# Patient Record
Sex: Female | Born: 1955
Health system: Southern US, Community
[De-identification: ages and names within clinical notes are randomized; demographics above are authoritative.]

## PROBLEM LIST (undated history)

## (undated) DIAGNOSIS — I10 Essential (primary) hypertension: Secondary | ICD-10-CM

## (undated) DIAGNOSIS — Z86018 Personal history of other benign neoplasm: Secondary | ICD-10-CM

## (undated) DIAGNOSIS — R112 Nausea with vomiting, unspecified: Secondary | ICD-10-CM

## (undated) DIAGNOSIS — D649 Anemia, unspecified: Secondary | ICD-10-CM

## (undated) DIAGNOSIS — H25019 Cortical age-related cataract, unspecified eye: Secondary | ICD-10-CM

## (undated) DIAGNOSIS — Z9889 Other specified postprocedural states: Secondary | ICD-10-CM

## (undated) DIAGNOSIS — I700 Atherosclerosis of aorta: Secondary | ICD-10-CM

## (undated) DIAGNOSIS — E785 Hyperlipidemia, unspecified: Secondary | ICD-10-CM

## (undated) DIAGNOSIS — R519 Headache, unspecified: Secondary | ICD-10-CM

## (undated) DIAGNOSIS — Z79620 Long term (current) use of immunosuppressive biologic: Secondary | ICD-10-CM

## (undated) DIAGNOSIS — N183 Chronic kidney disease, stage 3 unspecified: Secondary | ICD-10-CM

## (undated) DIAGNOSIS — G43109 Migraine with aura, not intractable, without status migrainosus: Secondary | ICD-10-CM

## (undated) DIAGNOSIS — M1990 Unspecified osteoarthritis, unspecified site: Secondary | ICD-10-CM

## (undated) DIAGNOSIS — Z8719 Personal history of other diseases of the digestive system: Secondary | ICD-10-CM

## (undated) DIAGNOSIS — C642 Malignant neoplasm of left kidney, except renal pelvis: Secondary | ICD-10-CM

## (undated) DIAGNOSIS — I2089 Other forms of angina pectoris: Secondary | ICD-10-CM

## (undated) DIAGNOSIS — J189 Pneumonia, unspecified organism: Secondary | ICD-10-CM

## (undated) DIAGNOSIS — I471 Supraventricular tachycardia, unspecified: Secondary | ICD-10-CM

## (undated) DIAGNOSIS — R51 Headache: Secondary | ICD-10-CM

## (undated) DIAGNOSIS — N189 Chronic kidney disease, unspecified: Secondary | ICD-10-CM

## (undated) DIAGNOSIS — I4891 Unspecified atrial fibrillation: Secondary | ICD-10-CM

## (undated) DIAGNOSIS — F419 Anxiety disorder, unspecified: Secondary | ICD-10-CM

## (undated) DIAGNOSIS — I499 Cardiac arrhythmia, unspecified: Secondary | ICD-10-CM

## (undated) DIAGNOSIS — K222 Esophageal obstruction: Secondary | ICD-10-CM

## (undated) DIAGNOSIS — T4145XA Adverse effect of unspecified anesthetic, initial encounter: Secondary | ICD-10-CM

## (undated) DIAGNOSIS — Z7901 Long term (current) use of anticoagulants: Secondary | ICD-10-CM

## (undated) DIAGNOSIS — Z85828 Personal history of other malignant neoplasm of skin: Secondary | ICD-10-CM

## (undated) DIAGNOSIS — I208 Other forms of angina pectoris: Secondary | ICD-10-CM

## (undated) DIAGNOSIS — K219 Gastro-esophageal reflux disease without esophagitis: Secondary | ICD-10-CM

## (undated) DIAGNOSIS — R011 Cardiac murmur, unspecified: Secondary | ICD-10-CM

## (undated) DIAGNOSIS — J45909 Unspecified asthma, uncomplicated: Secondary | ICD-10-CM

## (undated) DIAGNOSIS — E7800 Pure hypercholesterolemia, unspecified: Secondary | ICD-10-CM

## (undated) DIAGNOSIS — I2510 Atherosclerotic heart disease of native coronary artery without angina pectoris: Secondary | ICD-10-CM

## (undated) DIAGNOSIS — T8859XA Other complications of anesthesia, initial encounter: Secondary | ICD-10-CM

## (undated) DIAGNOSIS — K200 Eosinophilic esophagitis: Secondary | ICD-10-CM

## (undated) HISTORY — DX: Eosinophilic esophagitis: K20.0

## (undated) HISTORY — DX: Unspecified atrial fibrillation: I48.91

## (undated) HISTORY — DX: Personal history of other diseases of the digestive system: Z87.19

## (undated) HISTORY — PX: CYSTOSCOPY/RETROGRADE/URETEROSCOPY: SHX5316

## (undated) HISTORY — PX: ABDOMINAL HYSTERECTOMY: SHX81

## (undated) HISTORY — PX: LEFT HEART CATH AND CORONARY ANGIOGRAPHY: CATH118249

## (undated) HISTORY — DX: Malignant neoplasm of left kidney, except renal pelvis: C64.2

## (undated) HISTORY — DX: Personal history of other benign neoplasm: Z86.018

## (undated) HISTORY — DX: Chronic kidney disease, unspecified: N18.9

## (undated) HISTORY — PX: DILATION AND CURETTAGE OF UTERUS: SHX78

## (undated) HISTORY — PX: TUBAL LIGATION: SHX77

## (undated) HISTORY — PX: ABLATION: SHX5711

## (undated) HISTORY — PX: CORONARY ANGIOPLASTY: SHX604

## (undated) HISTORY — PX: VAGINAL HYSTERECTOMY: SUR661

## (undated) HISTORY — DX: Esophageal obstruction: K22.2

## (undated) HISTORY — PX: NASAL ENDOSCOPY WITH EPISTAXIS CONTROL: SHX5664

## (undated) HISTORY — DX: Hyperlipidemia, unspecified: E78.5

---
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
## 1991-05-14 HISTORY — PX: TUBAL LIGATION: SHX77

## 1998-11-19 ENCOUNTER — Other Ambulatory Visit: Admission: RE | Admit: 1998-11-19 | Discharge: 1998-11-19 | Payer: Self-pay | Admitting: *Deleted

## 1998-12-08 ENCOUNTER — Encounter: Payer: Self-pay | Admitting: *Deleted

## 1998-12-08 ENCOUNTER — Inpatient Hospital Stay (HOSPITAL_COMMUNITY): Admission: EM | Admit: 1998-12-08 | Discharge: 1998-12-10 | Payer: Self-pay | Admitting: Emergency Medicine

## 2003-09-19 ENCOUNTER — Other Ambulatory Visit: Payer: Self-pay

## 2008-10-21 ENCOUNTER — Ambulatory Visit: Payer: Self-pay | Admitting: Obstetrics & Gynecology

## 2008-10-28 ENCOUNTER — Ambulatory Visit: Payer: Self-pay | Admitting: Obstetrics & Gynecology

## 2009-08-06 ENCOUNTER — Ambulatory Visit: Payer: Self-pay | Admitting: Unknown Physician Specialty

## 2009-10-02 ENCOUNTER — Ambulatory Visit: Payer: Self-pay | Admitting: Internal Medicine

## 2009-11-09 ENCOUNTER — Ambulatory Visit: Payer: Self-pay | Admitting: Gastroenterology

## 2009-11-11 LAB — PATHOLOGY REPORT

## 2009-12-01 ENCOUNTER — Ambulatory Visit: Payer: Self-pay | Admitting: Gastroenterology

## 2009-12-10 ENCOUNTER — Ambulatory Visit: Payer: Self-pay | Admitting: Gastroenterology

## 2010-07-16 ENCOUNTER — Ambulatory Visit: Payer: Self-pay | Admitting: Internal Medicine

## 2012-03-02 ENCOUNTER — Ambulatory Visit: Payer: Self-pay | Admitting: Internal Medicine

## 2013-06-04 ENCOUNTER — Ambulatory Visit: Payer: Self-pay | Admitting: Urology

## 2013-10-21 DIAGNOSIS — K2100 Gastro-esophageal reflux disease with esophagitis, without bleeding: Secondary | ICD-10-CM | POA: Insufficient documentation

## 2014-09-07 ENCOUNTER — Emergency Department
Admission: EM | Admit: 2014-09-07 | Discharge: 2014-09-07 | Disposition: A | Discharge status: Home / Self Care | Payer: 59 | Attending: Emergency Medicine | Admitting: Emergency Medicine

## 2014-09-07 ENCOUNTER — Encounter: Payer: Self-pay | Admitting: *Deleted

## 2014-09-07 ENCOUNTER — Emergency Department: Payer: 59

## 2014-09-07 DIAGNOSIS — Z7982 Long term (current) use of aspirin: Secondary | ICD-10-CM | POA: Insufficient documentation

## 2014-09-07 DIAGNOSIS — Z9861 Coronary angioplasty status: Secondary | ICD-10-CM | POA: Diagnosis not present

## 2014-09-07 DIAGNOSIS — I4891 Unspecified atrial fibrillation: Secondary | ICD-10-CM | POA: Diagnosis not present

## 2014-09-07 DIAGNOSIS — Z79899 Other long term (current) drug therapy: Secondary | ICD-10-CM | POA: Insufficient documentation

## 2014-09-07 DIAGNOSIS — I10 Essential (primary) hypertension: Secondary | ICD-10-CM | POA: Insufficient documentation

## 2014-09-07 DIAGNOSIS — R079 Chest pain, unspecified: Secondary | ICD-10-CM | POA: Diagnosis not present

## 2014-09-07 DIAGNOSIS — R000 Tachycardia, unspecified: Secondary | ICD-10-CM | POA: Diagnosis present

## 2014-09-07 DIAGNOSIS — Z793 Long term (current) use of hormonal contraceptives: Secondary | ICD-10-CM | POA: Insufficient documentation

## 2014-09-07 HISTORY — DX: Gastro-esophageal reflux disease without esophagitis: K21.9

## 2014-09-07 HISTORY — DX: Cardiac arrhythmia, unspecified: I49.9

## 2014-09-07 HISTORY — DX: Essential (primary) hypertension: I10

## 2014-09-07 LAB — URINALYSIS COMPLETE WITH MICROSCOPIC (ARMC ONLY)
BILIRUBIN URINE: NEGATIVE
Glucose, UA: NEGATIVE mg/dL
HGB URINE DIPSTICK: NEGATIVE
KETONES UR: NEGATIVE mg/dL
LEUKOCYTES UA: NEGATIVE
NITRITE: NEGATIVE
PH: 8 (ref 5.0–8.0)
Protein, ur: NEGATIVE mg/dL
RBC / HPF: NONE SEEN RBC/hpf (ref 0–5)
SPECIFIC GRAVITY, URINE: 1.001 — AB (ref 1.005–1.030)

## 2014-09-07 LAB — COMPREHENSIVE METABOLIC PANEL
ALBUMIN: 4.2 g/dL (ref 3.5–5.0)
ALT: 22 U/L (ref 14–54)
ANION GAP: 6 (ref 5–15)
AST: 24 U/L (ref 15–41)
Alkaline Phosphatase: 42 U/L (ref 38–126)
BILIRUBIN TOTAL: 0.4 mg/dL (ref 0.3–1.2)
BUN: 20 mg/dL (ref 6–20)
CO2: 27 mmol/L (ref 22–32)
Calcium: 9.1 mg/dL (ref 8.9–10.3)
Chloride: 106 mmol/L (ref 101–111)
Creatinine, Ser: 0.67 mg/dL (ref 0.44–1.00)
GFR calc Af Amer: >60 mL/min (ref >60)
GFR calc non Af Amer: >60 mL/min (ref >60)
GLUCOSE: 101 mg/dL — AB (ref 65–99)
POTASSIUM: 3.1 mmol/L — AB (ref 3.5–5.1)
Sodium: 139 mmol/L (ref 135–145)
TOTAL PROTEIN: 7.3 g/dL (ref 6.5–8.1)

## 2014-09-07 LAB — TROPONIN I: Troponin I: <0.03 ng/mL (ref <0.031)

## 2014-09-07 LAB — CBC
HEMATOCRIT: 38 % (ref 35.0–47.0)
Hemoglobin: 12.9 g/dL (ref 12.0–16.0)
MCH: 28.9 pg (ref 26.0–34.0)
MCHC: 34 g/dL (ref 32.0–36.0)
MCV: 85 fL (ref 80.0–100.0)
Platelets: 250 10*3/uL (ref 150–440)
RBC: 4.47 MIL/uL (ref 3.80–5.20)
RDW: 13.3 % (ref 11.5–14.5)
WBC: 6.1 10*3/uL (ref 3.6–11.0)

## 2014-09-07 MED ORDER — DILTIAZEM LOAD VIA INFUSION: 15.0000 mg | Freq: Once | INTRAVENOUS | Status: DC

## 2014-09-07 MED ORDER — ALUM & MAG HYDROXIDE-SIMETH 200-200-20 MG/5ML PO SUSP
30.0000 mL | Freq: Once | ORAL | Status: AC
  Administered 2014-09-07: 30 mL via ORAL
  Filled 2014-09-07: qty 30

## 2014-09-07 MED ORDER — DILTIAZEM HCL 25 MG/5ML IV SOLN
INTRAVENOUS | Status: AC
  Filled 2014-09-07: qty 5

## 2014-09-07 MED ORDER — SODIUM CHLORIDE 0.9 % IV BOLUS (SEPSIS)
1000.0000 mL | Freq: Once | INTRAVENOUS | Status: AC
  Administered 2014-09-07: 1000 mL via INTRAVENOUS

## 2014-09-07 NOTE — ED Notes (Signed)
Per Dr Clearnce Hasten, who spoke cardio, approved for pt to take own supply of lopressor 50mg  PO.

## 2014-09-07 NOTE — Discharge Instructions (Signed)
Atrial Fibrillation Take your metoprolol as prescribed. However, if you feel you heart beating rapidly may take an additional metoprolol to try and control the rate. If at any point you feel chest pain, lightheaded or like you're going to pass out report to the emergency department immediately before taking any additional metoprolol. Atrial fibrillation is a type of irregular heart rhythm (arrhythmia). During atrial fibrillation, the upper chambers of the heart (atria) quiver continuously in a chaotic pattern. This causes an irregular and often rapid heart rate.  Atrial fibrillation is the result of the heart becoming overloaded with disorganized signals that tell it to beat. These signals are normally released one at a time by a part of the right atrium called the sinoatrial node. They then travel from the atria to the lower chambers of the heart (ventricles), causing the atria and ventricles to contract and pump blood as they pass. In atrial fibrillation, parts of the atria outside of the sinoatrial node also release these signals. This results in two problems. First, the atria receive so many signals that they do not have time to fully contract. Second, the ventricles, which can only receive one signal at a time, beat irregularly and out of rhythm with the atria.  There are three types of atrial fibrillation:   Paroxysmal. Paroxysmal atrial fibrillation starts suddenly and stops on its own within a week.  Persistent. Persistent atrial fibrillation lasts for more than a week. It may stop on its own or with treatment.  Permanent. Permanent atrial fibrillation does not go away. Episodes of atrial fibrillation may lead to permanent atrial fibrillation. Atrial fibrillation can prevent your heart from pumping blood normally. It increases your risk of stroke and can lead to heart failure.  CAUSES   Heart conditions, including a heart attack, heart failure, coronary artery disease, and heart valve  conditions.   Inflammation of the sac that surrounds the heart (pericarditis).  Blockage of an artery in the lungs (pulmonary embolism).  Pneumonia or other infections.  Chronic lung disease.  Thyroid problems, especially if the thyroid is overactive (hyperthyroidism).  Caffeine, excessive alcohol use, and use of some illegal drugs.   Use of some medicines, including certain decongestants and diet pills.  Heart surgery.   Birth defects.  Sometimes, no cause can be found. When this happens, the atrial fibrillation is called lone atrial fibrillation. The risk of complications from atrial fibrillation increases if you have lone atrial fibrillation and you are age 60 years or older. RISK FACTORS  Heart failure.  Coronary artery disease.  Diabetes mellitus.   High blood pressure (hypertension).   Obesity.   Other arrhythmias.   Increased age. SIGNS AND SYMPTOMS   A feeling that your heart is beating rapidly or irregularly.   A feeling of discomfort or pain in your chest.   Shortness of breath.   Sudden light-headedness or weakness.   Getting tired easily when exercising.   Urinating more often than normal (mainly when atrial fibrillation first begins).  In paroxysmal atrial fibrillation, symptoms may start and suddenly stop. DIAGNOSIS  Your health care provider may be able to detect atrial fibrillation when taking your pulse. Your health care provider may have you take a test called an ambulatory electrocardiogram (ECG). An ECG records your heartbeat patterns over a 24-hour period. You may also have other tests, such as:  Transthoracic echocardiogram (TTE). During echocardiography, sound waves are used to evaluate how blood flows through your heart.  Transesophageal echocardiogram (TEE).  Stress test. There  is more than one type of stress test. If a stress test is needed, ask your health care provider about which type is best for you.  Chest X-ray  exam.  Blood tests.  Computed tomography (CT). TREATMENT  Treatment may include:  Treating any underlying conditions. For example, if you have an overactive thyroid, treating the condition may correct atrial fibrillation.  Taking medicine. Medicines may be given to control a rapid heart rate or to prevent blood clots, heart failure, or a stroke.  Having a procedure to correct the rhythm of the heart:  Electrical cardioversion. During electrical cardioversion, a controlled, low-energy shock is delivered to the heart through your skin. If you have chest pain, very low blood pressure, or sudden heart failure, this procedure may need to be done as an emergency.  Catheter ablation. During this procedure, heart tissues that send the signals that cause atrial fibrillation are destroyed.  Surgical ablation. During this surgery, thin lines of heart tissue that carry the abnormal signals are destroyed. This procedure can either be an open-heart surgery or a minimally invasive surgery. With the minimally invasive surgery, small cuts are made to access the heart instead of a large opening.  Pulmonary venous isolation. During this surgery, tissue around the veins that carry blood from the lungs (pulmonary veins) is destroyed. This tissue is thought to carry the abnormal signals. HOME CARE INSTRUCTIONS   Take medicines only as directed by your health care provider. Some medicines can make atrial fibrillation worse or recur.  If blood thinners were prescribed by your health care provider, take them exactly as directed. Too much blood-thinning medicine can cause bleeding. If you take too little, you will not have the needed protection against stroke and other problems.  Perform blood tests at home if directed by your health care provider. Perform blood tests exactly as directed.  Quit smoking if you smoke.  Do not drink alcohol.  Do not drink caffeinated beverages such as coffee, soda, and some  teas. You may drink decaffeinated coffee, soda, or tea.   Maintain a healthy weight.Do not use diet pills unless your health care provider approves. They may make heart problems worse.   Follow diet instructions as directed by your health care provider.  Exercise regularly as directed by your health care provider.  Keep all follow-up visits as directed by your health care provider. This is important. PREVENTION  The following substances can cause atrial fibrillation to recur:   Caffeinated beverages.  Alcohol.  Certain medicines, especially those used for breathing problems.  Certain herbs and herbal medicines, such as those containing ephedra or ginseng.  Illegal drugs, such as cocaine and amphetamines. Sometimes medicines are given to prevent atrial fibrillation from recurring. Proper treatment of any underlying condition is also important in helping prevent recurrence.  SEEK MEDICAL CARE IF:  You notice a change in the rate, rhythm, or strength of your heartbeat.  You suddenly begin urinating more frequently.  You tire more easily when exerting yourself or exercising. SEEK IMMEDIATE MEDICAL CARE IF:   You have chest pain, abdominal pain, sweating, or weakness.  You feel nauseous.  You have shortness of breath.  You suddenly have swollen feet and ankles.  You feel dizzy.  Your face or limbs feel numb or weak.  You have a change in your vision or speech. MAKE SURE YOU:   Understand these instructions.  Will watch your condition.  Will get help right away if you are not doing well or get  worse. Document Released: 12/20/2004 Document Revised: 05/06/2013 Document Reviewed: 01/31/2012 [facility] Patient Information 2015 [place], [place]. This information is not intended to replace advice given to you by your health care provider. Make sure you discuss any questions you have with your health care provider.  Chest Pain (Nonspecific) It is often hard to give a  diagnosis for the cause of chest pain. There is always a chance that your pain could be related to something serious, such as a heart attack or a blood clot in the lungs. You need to follow up with your doctor. HOME CARE  If antibiotic medicine was given, take it as directed by your doctor. Finish the medicine even if you start to feel better.  For the next few days, avoid activities that bring on chest pain. Continue physical activities as told by your doctor.  Do not use any tobacco products. This includes cigarettes, chewing tobacco, and e-cigarettes.  Avoid drinking alcohol.  Only take medicine as told by your doctor.  Follow your doctor's suggestions for more testing if your chest pain does not go away.  Keep all doctor visits you made. GET HELP IF:  Your chest pain does not go away, even after treatment.  You have a rash with blisters on your chest.  You have a fever. GET HELP RIGHT AWAY IF:   You have more pain or pain that spreads to your arm, neck, jaw, back, or belly (abdomen).  You have shortness of breath.  You cough more than usual or cough up blood.  You have very bad back or belly pain.  You feel sick to your stomach (nauseous) or throw up (vomit).  You have very bad weakness.  You pass out (faint).  You have chills. This is an emergency. Do not wait to see if the problems will go away. Call your local emergency services (911 in [place] Do not drive yourself to the hospital. MAKE SURE YOU:   Understand these instructions.  Will watch your condition.  Will get help right away if you are not doing well or get worse. Document Released: 06/08/2007 Document Revised: 12/25/2012 Document Reviewed: 06/08/2007 [facility] Patient Information 2015 [place], [place]. This information is not intended to replace advice given to you by your health care provider. Make sure you discuss any questions you have with your health care provider.

## 2014-09-07 NOTE — ED Notes (Signed)
Pt woke up with rapid HR, took one of her Xanax.

## 2014-09-07 NOTE — ED Provider Notes (Signed)
[facility] Emergency Department Provider Note  ____________________________________________  Time seen: Approximately 940 AM  I have reviewed the triage vital signs and the nursing notes.   HISTORY  Chief Complaint Tachycardia    HPI Heidi Torres is a 59 y.o. female with a history of multiple arrhythmias including atrial fibrillation and was presenting today with a rapid heart rate since 8:30 AM. She says that her heart rate started upon waking and is associated with neck pain as well as pressure in her lower chest. She denies any shortness of breath. Says that she thinks she may have a urinary tract infection because of some "tingling" before and after urinating. She says for anticoagulation she'll he takes an 81 mg dose of aspirin per day. Dr. Clayborn Bigness is her cardiologist. She also takes 50 mg of metoprolol succinate but did not take her dose this morning.Says that the chest pressure feels like her indigestion for which she takes Carafate and Protonix.   Past Medical History  Diagnosis Date  . Hypertension   . GERD (gastroesophageal reflux disease)   . Irregular heart beat     There are no active problems to display for this patient.   Past Surgical History  Procedure Laterality Date  . Abdominal hysterectomy    . Coronary angioplasty    . Nasal endoscopy with epistaxis control      Current Outpatient Rx  Name  Route  Sig  Dispense  Refill  . ADVAIR DISKUS 100-50 MCG/DOSE AEPB   Oral   Take 1 puff by mouth daily.           Dispense as written.   Marland Kitchen ALPRAZolam (XANAX) 0.25 MG tablet   Oral   Take 0.25 mg by mouth daily as needed. Anxiety.      5   . aspirin EC 81 MG tablet   Oral   Take 81 mg by mouth every other day.         . calcium carbonate (OS-CAL) 600 MG TABS tablet   Oral   Take 600 mg by mouth daily.         . Cholecalciferol (VITAMIN D3) 5000 UNITS TABS   Oral   Take 5,000 Units by mouth daily.         Marland Kitchen  estradiol (ESTRACE) 2 MG tablet   Oral   Take 2 mg by mouth daily.      6   . KLOR-CON M10 10 MEQ tablet   Oral   Take 20 mEq by mouth daily.           Dispense as written.   . metoprolol succinate (TOPROL-XL) 50 MG 24 hr tablet   Oral   Take 50 mg by mouth daily.         . montelukast (SINGULAIR) 10 MG tablet   Oral   Take 10 mg by mouth daily.         . pantoprazole (PROTONIX) 40 MG tablet   Oral   Take 40 mg by mouth daily.         Marland Kitchen PROAIR HFA 108 (90 BASE) MCG/ACT inhaler   Inhalation   Inhale 2 puffs into the lungs every 6 (six) hours as needed. For shortness of breath and wheezing.           Dispense as written.   . sucralfate (CARAFATE) 1 G tablet   Oral   Take 2 g by mouth every morning.         Marland Kitchen  triamterene-hydrochlorothiazide (DYAZIDE) 37.5-25 MG per capsule   Oral   Take 1 capsule by mouth daily.         . valsartan (DIOVAN) 160 MG tablet   Oral   Take 160 mg by mouth daily.           Allergies Ciprofloxacin  History reviewed. No pertinent family history.  Social History Social History  Substance Use Topics  . Smoking status: Never Smoker   . Smokeless tobacco: Never Used  . Alcohol Use: No    Review of Systems Constitutional: No fever/chills Eyes: No visual changes. ENT: No sore throat. Cardiovascular: Palpitations Respiratory: Denies shortness of breath. Gastrointestinal: No abdominal pain.  No nausea, no vomiting.  No diarrhea.  No constipation. Genitourinary: Negative for dysuria. Musculoskeletal: Negative for back pain. Skin: Negative for rash. Neurological: Negative for headaches, focal weakness or numbness.  10-point ROS otherwise negative.  ____________________________________________   PHYSICAL EXAM:  VITAL SIGNS: ED Triage Vitals  Enc Vitals Group     BP 09/07/14 0939 154/105 mmHg     Pulse Rate 09/07/14 0939 131     Resp 09/07/14 0939 16     Temp 09/07/14 0939 97.6 F (36.4 C)     Temp Source  09/07/14 0939 Oral     SpO2 09/07/14 0939 99 %     Weight 09/07/14 0939 161 lb (73.029 kg)     Height 09/07/14 0939 5\' 6"  (1.676 m)     Head Cir --      Peak Flow --      Pain Score --      Pain Loc --      Pain Edu? --      Excl. in [place]? --     Constitutional: Alert and oriented. Well appearing and in no acute distress. Eyes: Conjunctivae are normal. PERRL. EOMI. Head: Atraumatic. Nose: No congestion/rhinnorhea. Mouth/Throat: Mucous membranes are moist.  Oropharynx non-erythematous. Neck: No stridor.   Cardiovascular: Tachycardic with an irregularly irregular rhythm. Grossly normal heart sounds.  Good peripheral circulation. Respiratory: Normal respiratory effort.  No retractions. Lungs CTAB. Gastrointestinal: Soft and nontender. No distention. No abdominal bruits. No CVA tenderness. Musculoskeletal: No lower extremity tenderness nor edema.  No joint effusions. Neurologic:  Normal speech and language. No gross focal neurologic deficits are appreciated. No gait instability. Skin:  Skin is warm, dry and intact. No rash noted. Psychiatric: Mood and affect are normal. Speech and behavior are normal.  ____________________________________________   LABS (all labs ordered are listed, but only abnormal results are displayed)  Labs Reviewed  COMPREHENSIVE METABOLIC PANEL - Abnormal; Notable for the following:    Potassium 3.1 (*)    Glucose, Bld 101 (*)    All other components within normal limits  URINALYSIS COMPLETEWITH MICROSCOPIC ([facility] ONLY) - Abnormal; Notable for the following:    Color, Urine COLORLESS (*)    APPearance CLEAR (*)    Specific Gravity, Urine 1.001 (*)    Bacteria, UA RARE (*)    Squamous Epithelial / LPF 0-5 (*)    All other components within normal limits  CBC  TROPONIN I  TROPONIN I   ____________________________________________  EKG  ED ECG REPORT I, Doran Stabler, the attending physician, personally viewed and interpreted this ECG.   Date:  09/07/2014  EKG Time: 928  Rate: 135  Rhythm: atrial fibrillation, rate 135  Axis: Left axis deviation  Intervals:none  ST&T Change: T-wave inversion in aVL. Diffuse and mild ST depression most pronounced in leads  1, 2 and 3 as well as the lateral leads. There is no ST elevation.  ED ECG REPORT I, Doran Stabler, the attending physician, personally viewed and interpreted this ECG.   Date: 09/07/2014  EKG Time: 1038  Rate: 60  Rhythm: normal sinus rhythm  Axis: Left axis deviation  Intervals:none  ST&T Change: No ST elevations or depressions. No abnormal T-wave inversions.  No change from EKG from September 2005.   ____________________________________________  RADIOLOGY  Bibasilar subsegmental atelectasis versus scarring. I personally viewed these images. ____________________________________________   PROCEDURES    ____________________________________________   INITIAL IMPRESSION / ASSESSMENT AND PLAN / ED COURSE  Pertinent labs & imaging results that were available during my care of the patient were reviewed by me and considered in my medical decision making (see chart for details).  ----------------------------------------- 11:45 AM on 09/07/2014 -----------------------------------------  Patient converted spontaneously to normal sinus rhythm. Says that her neck pain is now resolved. Says that she still has persistent pressure to the epigastrium and low chest which is consistent with her indigestion. She says that she thinks she ate some sausage yesterday which exacerbated her reflux. She says that she has had reflux in the past which feels exactly like this. She also says that she has had this neck pain in the past with exertion. She says that she has had a catheterization as well as a stress test and has never needed any stents. She says this was part of a workup for her neck pain with exertion. She says that this neck pain has been ongoing for years without any  change.  I discussed her case with Dr Saralyn Pilar who recommends that the patient see Dr. Clayborn Bigness in the office to discuss anticoagulation. Does not recommend starting an irregular step this time. Patient says that she also does not want anticoagulation at this time. She knows that there is a risk of stroke and is familiar with because her mother had a stroke secondary to A. fib in her 78s.  The plan at this time is to recheck a troponin at 12:30. If her troponin is still undetectable she'll be discharged home for outpatient follow-up. We discussed the importance of taking her metoprolol as prescribed as well as using an additional dose when she feels her A. fib becoming rapid. She understands this plan, her husband is also the bedside and we were discussing this, and the patient is willing to comply.  ----------------------------------------- 1:46 PM on 09/07/2014 -----------------------------------------  Patient is resting comfortably at this time. Says that the Maalox did not help the pressure in her lower chest and upper abdomen but she says this is typical of her reflux. No further neck and jaw pain. Still a sinus rhythm on the monitor. Symptoms at this point appear chronic and the patient has a reassuring lab work and repeat EKG. We'll discharge to home for follow-up in the office with Dr. Clayborn Bigness this Tuesday. Patient and husband are aware of the plantar willing to comply. ____________________________________________   FINAL CLINICAL IMPRESSION(S) / ED DIAGNOSES  Acute atrial fibrillation with rapid ventricular response. Initial visit.      Orbie Pyo, MD 09/07/14 [phone]

## 2015-05-05 ENCOUNTER — Encounter: Admission: RE | Payer: Self-pay | Source: Ambulatory Visit

## 2015-05-05 ENCOUNTER — Ambulatory Visit: Admission: RE | Admit: 2015-05-05 | Payer: 59 | Source: Ambulatory Visit | Admitting: Gastroenterology

## 2015-05-05 SURGERY — ESOPHAGOGASTRODUODENOSCOPY (EGD) WITH PROPOFOL
Anesthesia: General

## 2015-06-22 DIAGNOSIS — Z85828 Personal history of other malignant neoplasm of skin: Secondary | ICD-10-CM

## 2015-06-22 HISTORY — DX: Personal history of other malignant neoplasm of skin: Z85.828

## 2015-12-22 ENCOUNTER — Other Ambulatory Visit: Payer: Self-pay | Admitting: Obstetrics and Gynecology

## 2015-12-22 DIAGNOSIS — N63 Unspecified lump in unspecified breast: Secondary | ICD-10-CM

## 2015-12-31 ENCOUNTER — Other Ambulatory Visit: Payer: 59

## 2015-12-31 ENCOUNTER — Ambulatory Visit
Admission: RE | Admit: 2015-12-31 | Discharge: 2015-12-31 | Disposition: A | Discharge status: Home / Self Care | Payer: 59 | Source: Ambulatory Visit | Attending: Obstetrics and Gynecology | Admitting: Obstetrics and Gynecology

## 2015-12-31 DIAGNOSIS — N63 Unspecified lump in unspecified breast: Secondary | ICD-10-CM

## 2016-01-07 ENCOUNTER — Other Ambulatory Visit: Payer: Self-pay | Admitting: Obstetrics and Gynecology

## 2016-01-07 DIAGNOSIS — N63 Unspecified lump in unspecified breast: Secondary | ICD-10-CM

## 2016-02-22 DIAGNOSIS — Z23 Encounter for immunization: Secondary | ICD-10-CM | POA: Diagnosis not present

## 2016-02-22 DIAGNOSIS — Z0000 Encounter for general adult medical examination without abnormal findings: Secondary | ICD-10-CM | POA: Diagnosis not present

## 2016-02-22 DIAGNOSIS — I10 Essential (primary) hypertension: Secondary | ICD-10-CM | POA: Diagnosis not present

## 2016-02-22 DIAGNOSIS — E7800 Pure hypercholesterolemia, unspecified: Secondary | ICD-10-CM | POA: Diagnosis not present

## 2016-03-16 DIAGNOSIS — R002 Palpitations: Secondary | ICD-10-CM | POA: Diagnosis not present

## 2016-03-16 DIAGNOSIS — R079 Chest pain, unspecified: Secondary | ICD-10-CM | POA: Diagnosis not present

## 2016-03-16 DIAGNOSIS — I208 Other forms of angina pectoris: Secondary | ICD-10-CM | POA: Diagnosis not present

## 2016-03-16 DIAGNOSIS — I10 Essential (primary) hypertension: Secondary | ICD-10-CM | POA: Diagnosis not present

## 2016-03-31 DIAGNOSIS — R002 Palpitations: Secondary | ICD-10-CM | POA: Diagnosis not present

## 2016-04-05 DIAGNOSIS — R079 Chest pain, unspecified: Secondary | ICD-10-CM | POA: Diagnosis not present

## 2016-04-05 DIAGNOSIS — I208 Other forms of angina pectoris: Secondary | ICD-10-CM | POA: Diagnosis not present

## 2016-04-08 DIAGNOSIS — I25118 Atherosclerotic heart disease of native coronary artery with other forms of angina pectoris: Secondary | ICD-10-CM | POA: Diagnosis not present

## 2016-04-08 DIAGNOSIS — I10 Essential (primary) hypertension: Secondary | ICD-10-CM | POA: Diagnosis not present

## 2016-04-08 DIAGNOSIS — R002 Palpitations: Secondary | ICD-10-CM | POA: Diagnosis not present

## 2016-04-19 DIAGNOSIS — M21961 Unspecified acquired deformity of right lower leg: Secondary | ICD-10-CM | POA: Diagnosis not present

## 2016-04-19 DIAGNOSIS — M2011 Hallux valgus (acquired), right foot: Secondary | ICD-10-CM | POA: Diagnosis not present

## 2016-05-16 DIAGNOSIS — I10 Essential (primary) hypertension: Secondary | ICD-10-CM | POA: Diagnosis not present

## 2016-05-16 DIAGNOSIS — R51 Headache: Secondary | ICD-10-CM | POA: Diagnosis not present

## 2016-05-19 DIAGNOSIS — R51 Headache: Secondary | ICD-10-CM | POA: Diagnosis not present

## 2016-05-19 DIAGNOSIS — J3489 Other specified disorders of nose and nasal sinuses: Secondary | ICD-10-CM | POA: Diagnosis not present

## 2016-05-19 DIAGNOSIS — R0982 Postnasal drip: Secondary | ICD-10-CM | POA: Diagnosis not present

## 2016-05-19 DIAGNOSIS — J301 Allergic rhinitis due to pollen: Secondary | ICD-10-CM | POA: Diagnosis not present

## 2016-06-24 DIAGNOSIS — R350 Frequency of micturition: Secondary | ICD-10-CM | POA: Diagnosis not present

## 2016-06-24 DIAGNOSIS — R300 Dysuria: Secondary | ICD-10-CM | POA: Diagnosis not present

## 2016-10-11 DIAGNOSIS — E7800 Pure hypercholesterolemia, unspecified: Secondary | ICD-10-CM | POA: Diagnosis not present

## 2016-10-11 DIAGNOSIS — E559 Vitamin D deficiency, unspecified: Secondary | ICD-10-CM | POA: Diagnosis not present

## 2016-10-11 DIAGNOSIS — I10 Essential (primary) hypertension: Secondary | ICD-10-CM | POA: Diagnosis not present

## 2016-10-12 DIAGNOSIS — I10 Essential (primary) hypertension: Secondary | ICD-10-CM | POA: Diagnosis not present

## 2016-10-12 DIAGNOSIS — E559 Vitamin D deficiency, unspecified: Secondary | ICD-10-CM | POA: Diagnosis not present

## 2016-10-12 DIAGNOSIS — E7800 Pure hypercholesterolemia, unspecified: Secondary | ICD-10-CM | POA: Diagnosis not present

## 2016-10-14 ENCOUNTER — Emergency Department: Payer: 59

## 2016-10-14 ENCOUNTER — Emergency Department
Admission: EM | Admit: 2016-10-14 | Discharge: 2016-10-14 | Disposition: A | Discharge status: Home / Self Care | Payer: 59 | Attending: Emergency Medicine | Admitting: Emergency Medicine

## 2016-10-14 DIAGNOSIS — Z9861 Coronary angioplasty status: Secondary | ICD-10-CM | POA: Diagnosis not present

## 2016-10-14 DIAGNOSIS — I10 Essential (primary) hypertension: Secondary | ICD-10-CM | POA: Insufficient documentation

## 2016-10-14 DIAGNOSIS — Z7982 Long term (current) use of aspirin: Secondary | ICD-10-CM | POA: Insufficient documentation

## 2016-10-14 DIAGNOSIS — Z79899 Other long term (current) drug therapy: Secondary | ICD-10-CM | POA: Insufficient documentation

## 2016-10-14 DIAGNOSIS — R51 Headache: Secondary | ICD-10-CM | POA: Insufficient documentation

## 2016-10-14 LAB — URINALYSIS, COMPLETE (UACMP) WITH MICROSCOPIC
BILIRUBIN URINE: NEGATIVE
Glucose, UA: NEGATIVE mg/dL
Hgb urine dipstick: NEGATIVE
Ketones, ur: NEGATIVE mg/dL
LEUKOCYTES UA: NEGATIVE
Nitrite: NEGATIVE
PH: 7 (ref 5.0–8.0)
Protein, ur: NEGATIVE mg/dL
RBC / HPF: NONE SEEN RBC/hpf (ref 0–5)
SPECIFIC GRAVITY, URINE: 1.005 (ref 1.005–1.030)

## 2016-10-14 LAB — TROPONIN I

## 2016-10-14 MED ORDER — CYCLOBENZAPRINE HCL 10 MG PO TABS: 10.0000 mg | ORAL_TABLET | Freq: Three times a day (TID) | ORAL | 0 refills | Status: DC | PRN

## 2016-10-14 NOTE — ED Triage Notes (Signed)
Pt c/o high blood pressure that is recurrent. Pt also states she has tingling all over face and a headache. Pt takes valsartan160mg  for blood pressure but has not taken any this morning. Pt saw her MD about BP on Wednesday.

## 2016-10-14 NOTE — ED Notes (Signed)
Pt back from CT at this time 

## 2016-10-14 NOTE — ED Provider Notes (Signed)
addendum to include patient states that she's been having a lot of muscle soreness along the neck and does have a tight right sided especially trapezius, which may be the source of her headaches. She is going try Flexeril.   Lisa Roca, MD 10/14/16 1210

## 2016-10-14 NOTE — Discharge Instructions (Signed)
You were evaluated for elevated blood pressures, and although no certain cause was found, your exam and evaluation are reassuring in the emergency department today.   Return to the emergency room immediately for any worsening or uncontrolled headache, vision changes, one-sided weakness or numbness, any confusion or altered mental status, chest pain, nausea or sweats or trouble breathing, or any other symptoms concerning to you.

## 2016-10-14 NOTE — ED Notes (Signed)
Pt reports that she has had a headache, face and leg tingling with high blood pressure. She went to see her PMD. They increased her B/P medication, she took her B/P today and it was elevated. She took her medication last night but not this morning.

## 2016-10-14 NOTE — ED Provider Notes (Signed)
[facility] Emergency Department Provider Note ____________________________________________   I have reviewed the triage vital signs and the triage nursing note.  HISTORY  Chief Complaint Hypertension   Historian Patient and friend at the bedside  HPI Heidi Torres is a 61 y.o. female with a history of hypertension and occasional asthma presents with concern about elevated blood pressures. She states that she started off with a headache which is gradual onset about a week ago which has been persistent. This is unusual for her. No vision changes. She feels like she's had tingly sensation across her whole face as well as both in her legs.  Several days ago she checked her blood pressure thinking may be the blood pressure might be causing her headache. She found her blood pressures have been elevated in the 180s to 200s. She had her primary care doctor evaluate her in the last 2 days where she had a set of blood work which was all normal. She also had an increase in her blood pressure dose of losartan from 50 mg daily to 75 mg daily, and yesterday took an additional 50 mg at night because of persistent elevated blood pressures. She is also on triamterene/HIDA chlorothiazide as well.  Over a month ago she switched from valsartan and to losartan and did not notice any changes or difficulties until this past week when she developed a headache and then a few days ago when she checked and found her blood pressures were elevated.  No chest pain or shortness of breath. No swelling.  She is concerned about her low-grade temperature 90.9 here in the emergency department. Denies urinary frequency.  States that she's been having some facial pain across the sinuses of her cheek bones.    Past Medical History:  Diagnosis Date  . GERD (gastroesophageal reflux disease)   . Hypertension   . Irregular heart beat     There are no active problems to display for this  patient.   Past Surgical History:  Procedure Laterality Date  . ABDOMINAL HYSTERECTOMY    . CORONARY ANGIOPLASTY    . NASAL ENDOSCOPY WITH EPISTAXIS CONTROL      Prior to Admission medications   Medication Sig Start Date End Date Taking? Authorizing Provider  ADVAIR DISKUS 100-50 MCG/DOSE AEPB Take 1 puff by mouth daily. 08/01/14  Yes [provider]  aspirin EC 81 MG tablet Take 81 mg by mouth every other day.   Yes [provider]  calcium carbonate (OS-CAL) 600 MG TABS tablet Take 600 mg by mouth daily.   Yes [provider]  Cholecalciferol (VITAMIN D3) 5000 UNITS TABS Take 5,000 Units by mouth daily.   Yes [provider]  estradiol (ESTRACE) 2 MG tablet Take 2 mg by mouth daily. 08/01/14  Yes [provider]  KLOR-CON M10 10 MEQ tablet Take 20 mEq by mouth daily. 07/10/14  Yes [provider]  losartan (COZAAR) 100 MG tablet Take 100 mg by mouth daily.   Yes [provider]  metoprolol succinate (TOPROL-XL) 50 MG 24 hr tablet Take 50 mg by mouth daily. Take with or immediately following a meal.   Yes [provider]  montelukast (SINGULAIR) 10 MG tablet Take 10 mg by mouth daily. 07/10/14  Yes [provider]  pantoprazole (PROTONIX) 40 MG tablet Take 40 mg by mouth 2 (two) times daily.  07/10/14  Yes [provider]  potassium chloride (K-DUR) 10 MEQ tablet Take 10 mEq by mouth 2 (two) times  daily.   Yes [provider]  sucralfate (CARAFATE) 1 G tablet Take by mouth 2 (two) times daily.  07/10/14  Yes [provider]  triamterene-hydrochlorothiazide (DYAZIDE) 37.5-25 MG per capsule Take 1 capsule by mouth daily. 07/10/14  Yes [provider]  ALPRAZolam (XANAX) 0.25 MG tablet Take 0.25 mg by mouth daily as needed. Anxiety. 08/11/14   [provider]  PROAIR HFA 108 (90 BASE) MCG/ACT inhaler Inhale 2 puffs into the lungs every 6 (six) hours as needed. For shortness of  breath and wheezing. 08/22/14   [provider]    Allergies  Allergen Reactions  . Ciprofloxacin Other (See Comments)    Arms were numb    History reviewed. No pertinent family history.  Social History Social History  Substance Use Topics  . Smoking status: Never Smoker  . Smokeless tobacco: Never Used  . Alcohol use No    Review of Systems  Constitutional: Negative for fever. Eyes: Negative for visual changes. ENT: Negative for sore throat. Cardiovascular: Negative for chest pain. Respiratory: Negative for shortness of breath. Gastrointestinal: Negative for abdominal pain, vomiting and diarrhea. Genitourinary: Negative for dysuria. Musculoskeletal: Negative for back pain. Skin: Negative for rash. Neurological: positive for headache.  ____________________________________________   PHYSICAL EXAM:  VITAL SIGNS: ED Triage Vitals  Enc Vitals Group     BP 10/14/16 0820 (!) 214/86     Pulse Rate 10/14/16 0820 67     Resp --      Temp 10/14/16 0820 99.2 F (37.3 C)     Temp Source 10/14/16 0820 Oral     SpO2 10/14/16 0820 99 %     Weight 10/14/16 0822 158 lb (71.7 kg)     Height 10/14/16 0822 5\' 6"  (1.676 m)     Head Circumference --      Peak Flow --      Pain Score --      Pain Loc --      Pain Edu? --      Excl. in [place]? --      Constitutional: Alert and oriented. Well appearing and in no distress. HEENT   Head: Normocephalic and atraumatic.  mild facial pain across the maxillary sinuses. No redness there.      Eyes: Conjunctivae are normal. Pupils equal and round.       Ears:         Nose: No congestion/rhinnorhea.   Mouth/Throat: Mucous membranes are moist.   Neck: No stridor.  no stiff neck. Cardiovascular/Chest: Normal rate, regular rhythm.  No murmurs, rubs, or gallops. Respiratory: Normal respiratory effort without tachypnea nor retractions. Breath sounds are clear and equal bilaterally. No wheezes/rales/rhonchi. Gastrointestinal:  Soft. No distention, no guarding, no rebound. Nontender.  Genitourinary/rectal:Deferred Musculoskeletal: Nontender with normal range of motion in all extremities. No joint effusions.  No lower extremity tenderness.  No edema. Neurologic:  Normal speech and language. No gross or focal neurologic deficits are appreciated. Skin:  Skin is warm, dry and intact. No rash noted. Psychiatric: Mood and affect are normal. Speech and behavior are normal. Patient exhibits appropriate insight and judgment.   ____________________________________________  LABS (pertinent positives/negatives) I, Lisa Roca, MD the attending physician have reviewed the labs noted below.  Labs Reviewed  URINALYSIS, COMPLETE (UACMP) WITH MICROSCOPIC - Abnormal; Notable for the following:       Result Value   Color, Urine STRAW (*)    APPearance CLEAR (*)    Bacteria, UA MANY (*)    Squamous  Epithelial / LPF 0-5 (*)    All other components within normal limits  URINE CULTURE  TROPONIN I    ____________________________________________    EKG I, Lisa Roca, MD, the attending physician have personally viewed and interpreted all ECGs.  57 bpm. Left axis deviation. Normal sinus rhythm. Nonspecific ST and T-wave ____________________________________________  RADIOLOGY All Xrays were viewed by me.  Imaging interpreted by Radiologist, and I, Lisa Roca, MD the attending physician have reviewed the radiologist interpretation noted below.  CT head and CT maxillofacial without contrast:  IMPRESSION: No intracranial abnormality.  No evidence of sinusitis. __________________________________________  PROCEDURES  Procedure(s) performed: None  Critical Care performed: None  ____________________________________________  No current facility-administered medications on file prior to encounter.    Current Outpatient Prescriptions on File Prior to Encounter  Medication Sig Dispense Refill  . ADVAIR DISKUS 100-50  MCG/DOSE AEPB Take 1 puff by mouth daily.    Marland Kitchen aspirin EC 81 MG tablet Take 81 mg by mouth every other day.    . calcium carbonate (OS-CAL) 600 MG TABS tablet Take 600 mg by mouth daily.    . Cholecalciferol (VITAMIN D3) 5000 UNITS TABS Take 5,000 Units by mouth daily.    Marland Kitchen estradiol (ESTRACE) 2 MG tablet Take 2 mg by mouth daily.  6  . KLOR-CON M10 10 MEQ tablet Take 20 mEq by mouth daily.    . montelukast (SINGULAIR) 10 MG tablet Take 10 mg by mouth daily.    . pantoprazole (PROTONIX) 40 MG tablet Take 40 mg by mouth 2 (two) times daily.     . sucralfate (CARAFATE) 1 G tablet Take by mouth 2 (two) times daily.     Marland Kitchen triamterene-hydrochlorothiazide (DYAZIDE) 37.5-25 MG per capsule Take 1 capsule by mouth daily.    Marland Kitchen ALPRAZolam (XANAX) 0.25 MG tablet Take 0.25 mg by mouth daily as needed. Anxiety.  5  . PROAIR HFA 108 (90 BASE) MCG/ACT inhaler Inhale 2 puffs into the lungs every 6 (six) hours as needed. For shortness of breath and wheezing.      ____________________________________________  ED COURSE / ASSESSMENT AND PLAN  Pertinent labs & imaging results that were available during my care of the patient were reviewed by me and considered in my medical decision making (see chart for details).  patient is very concerned about elevated blood pressures, despite having increased dose over the last 2 or 3 days given primary care doctor instructions, decided to come in for further evaluation.  I was able to review her CBC and metabolic panel from 2 days ago which are reassuring.  The only complaint he can come up with is that she's had a non specific headache for about a week, and we decided to proceed with head CT and maxillofacial sinusitis CT and these were overall reassuring.  She has bp meds at home.  UA with bacteria but no other associated finding for uti - will send culture, low suspician for active uti.   DIFFERENTIAL DIAGNOSIS:  Including but no limited to sinus infection, uti,  intracranial emergency such as stroke, bleeding, tumor, sinusitis, asymptomatic myocardial infarction, change in medication, etc.  CONSULTATIONS:  none  Patient / Family / Caregiver informed of clinical course, medical decision-making process, and agree with plan.   I discussed return precautions, follow-up instructions, and discharge instructions with patient and/or family.  Discharge Instructions : You were evaluated for elevated blood pressures, and although no certain cause was found, your exam and evaluation are reassuring in the emergency  department today.   Return to the emergency room immediately for any worsening or uncontrolled headache, vision changes, one-sided weakness or numbness, any confusion or altered mental status, chest pain, nausea or sweats or trouble breathing, or any other symptoms concerning to you.  ___________________________________________   FINAL CLINICAL IMPRESSION(S) / ED DIAGNOSES   Final diagnoses:  Essential hypertension              Note: This dictation was prepared with Dragon dictation. Any transcriptional errors that result from this process are unintentional    Lisa Roca, MD 10/14/16 1150

## 2016-10-17 DIAGNOSIS — M9901 Segmental and somatic dysfunction of cervical region: Secondary | ICD-10-CM | POA: Diagnosis not present

## 2016-10-17 DIAGNOSIS — M4302 Spondylolysis, cervical region: Secondary | ICD-10-CM | POA: Diagnosis not present

## 2016-10-17 LAB — URINE CULTURE: Culture: >=100000 — AB

## 2016-10-18 DIAGNOSIS — M9901 Segmental and somatic dysfunction of cervical region: Secondary | ICD-10-CM | POA: Diagnosis not present

## 2016-10-18 DIAGNOSIS — M4302 Spondylolysis, cervical region: Secondary | ICD-10-CM | POA: Diagnosis not present

## 2016-10-18 DIAGNOSIS — I10 Essential (primary) hypertension: Secondary | ICD-10-CM | POA: Diagnosis not present

## 2016-10-18 DIAGNOSIS — R011 Cardiac murmur, unspecified: Secondary | ICD-10-CM | POA: Diagnosis not present

## 2016-10-18 DIAGNOSIS — E7800 Pure hypercholesterolemia, unspecified: Secondary | ICD-10-CM | POA: Diagnosis not present

## 2016-10-18 NOTE — Progress Notes (Signed)
PHARMACY CONSULT NOTE - INITIAL   Pharmacy Consult for ED CULTURE RESULTS  Allergies  Allergen Reactions  . Ciprofloxacin Other (See Comments)    Arms were numb    Patient Measurements: Height: 5\' 6"  (167.6 cm) Weight: 158 lb (71.7 kg) IBW/kg (Calculated) : 59.3  Microbiology: Recent Results (from the past 720 hour(s))  Urine culture     Status: Abnormal   Collection Time: 10/14/16 10:01 AM  Result Value Ref Range Status   Specimen Description URINE, RANDOM  Final   Special Requests NONE  Final   Culture >=100,000 COLONIES/mL ESCHERICHIA COLI (A)  Final   Report Status 10/17/2016 FINAL  Final   Organism ID, Bacteria ESCHERICHIA COLI (A)  Final      Susceptibility   Escherichia coli - MIC*    AMPICILLIN >=32 RESISTANT Resistant     CEFAZOLIN <=4 SENSITIVE Sensitive     CEFTRIAXONE <=1 SENSITIVE Sensitive     CIPROFLOXACIN <=0.25 SENSITIVE Sensitive     GENTAMICIN <=1 SENSITIVE Sensitive     IMIPENEM <=0.25 SENSITIVE Sensitive     NITROFURANTOIN 64 INTERMEDIATE Intermediate     TRIMETH/SULFA >=320 RESISTANT Resistant     AMPICILLIN/SULBACTAM >=32 RESISTANT Resistant     PIP/TAZO <=4 SENSITIVE Sensitive     Extended ESBL NEGATIVE Sensitive     * >=100,000 COLONIES/mL ESCHERICHIA COLI    Medical History: Past Medical History:  Diagnosis Date  . GERD (gastroesophageal reflux disease)   . Hypertension   . Irregular heart beat    Assessment: 61 yo female seen in ED on 10/14/16 with hypertension and headache. UCx results now showing >100,000 E. Coli. Patient was not discharged from ED with any Abx.    Plan:  After discussion with Dr. Reita Cliche, patient will be prescribed Keflex (Cephalexin) 500mg  #21 capsules, taking 3 times daily for 7 days.   Patient was contacted on 10/16 @ 1315. Urine culture results and Abx plan were discussed. Patient verbalized understanding and requested RX be called in [facility] on [address] in [place].  RX for cephalexin was called into [facility] @1320   on 10/16.   Pernell Dupre, PharmD, BCPS Clinical Pharmacist 10/18/2016 1:23 PM

## 2016-10-21 DIAGNOSIS — M9901 Segmental and somatic dysfunction of cervical region: Secondary | ICD-10-CM | POA: Diagnosis not present

## 2016-10-21 DIAGNOSIS — M4302 Spondylolysis, cervical region: Secondary | ICD-10-CM | POA: Diagnosis not present

## 2016-11-01 DIAGNOSIS — R011 Cardiac murmur, unspecified: Secondary | ICD-10-CM | POA: Diagnosis not present

## 2016-11-03 DIAGNOSIS — J189 Pneumonia, unspecified organism: Secondary | ICD-10-CM

## 2016-11-03 HISTORY — DX: Pneumonia, unspecified organism: J18.9

## 2016-11-09 DIAGNOSIS — R300 Dysuria: Secondary | ICD-10-CM | POA: Diagnosis not present

## 2016-11-24 DIAGNOSIS — R918 Other nonspecific abnormal finding of lung field: Secondary | ICD-10-CM | POA: Diagnosis not present

## 2016-11-24 DIAGNOSIS — R05 Cough: Secondary | ICD-10-CM | POA: Diagnosis not present

## 2016-11-24 DIAGNOSIS — R509 Fever, unspecified: Secondary | ICD-10-CM | POA: Diagnosis not present

## 2016-11-24 DIAGNOSIS — J189 Pneumonia, unspecified organism: Secondary | ICD-10-CM | POA: Diagnosis not present

## 2016-11-28 ENCOUNTER — Other Ambulatory Visit: Payer: Self-pay | Admitting: Family Medicine

## 2016-11-28 DIAGNOSIS — J159 Unspecified bacterial pneumonia: Secondary | ICD-10-CM | POA: Diagnosis not present

## 2016-11-28 DIAGNOSIS — I10 Essential (primary) hypertension: Secondary | ICD-10-CM | POA: Diagnosis not present

## 2016-11-28 DIAGNOSIS — R9389 Abnormal findings on diagnostic imaging of other specified body structures: Secondary | ICD-10-CM | POA: Diagnosis not present

## 2016-11-29 ENCOUNTER — Ambulatory Visit
Admission: RE | Admit: 2016-11-29 | Discharge: 2016-11-29 | Disposition: A | Discharge status: Home / Self Care | Payer: 59 | Source: Ambulatory Visit | Attending: Family Medicine | Admitting: Family Medicine

## 2016-11-29 DIAGNOSIS — R935 Abnormal findings on diagnostic imaging of other abdominal regions, including retroperitoneum: Secondary | ICD-10-CM | POA: Diagnosis not present

## 2016-11-29 DIAGNOSIS — R918 Other nonspecific abnormal finding of lung field: Secondary | ICD-10-CM | POA: Insufficient documentation

## 2016-11-29 DIAGNOSIS — R9389 Abnormal findings on diagnostic imaging of other specified body structures: Secondary | ICD-10-CM | POA: Diagnosis present

## 2016-11-29 DIAGNOSIS — R590 Localized enlarged lymph nodes: Secondary | ICD-10-CM | POA: Insufficient documentation

## 2016-12-01 DIAGNOSIS — J189 Pneumonia, unspecified organism: Secondary | ICD-10-CM | POA: Diagnosis not present

## 2016-12-01 DIAGNOSIS — R9389 Abnormal findings on diagnostic imaging of other specified body structures: Secondary | ICD-10-CM | POA: Diagnosis not present

## 2016-12-08 ENCOUNTER — Other Ambulatory Visit: Payer: Self-pay | Admitting: Family Medicine

## 2016-12-08 DIAGNOSIS — N289 Disorder of kidney and ureter, unspecified: Secondary | ICD-10-CM

## 2016-12-09 DIAGNOSIS — J189 Pneumonia, unspecified organism: Secondary | ICD-10-CM | POA: Diagnosis not present

## 2016-12-12 ENCOUNTER — Encounter (INDEPENDENT_AMBULATORY_CARE_PROVIDER_SITE_OTHER): Payer: Self-pay | Admitting: Vascular Surgery

## 2016-12-14 ENCOUNTER — Encounter (INDEPENDENT_AMBULATORY_CARE_PROVIDER_SITE_OTHER): Payer: Self-pay | Admitting: Vascular Surgery

## 2016-12-14 ENCOUNTER — Encounter (INDEPENDENT_AMBULATORY_CARE_PROVIDER_SITE_OTHER): Payer: Self-pay

## 2016-12-14 ENCOUNTER — Ambulatory Visit (INDEPENDENT_AMBULATORY_CARE_PROVIDER_SITE_OTHER): Payer: BC Managed Care – PPO | Admitting: Vascular Surgery

## 2016-12-14 VITALS — BP 118/67 | HR 66 | Resp 17 | Ht 66.0 in | Wt 161.4 lb

## 2016-12-14 DIAGNOSIS — E785 Hyperlipidemia, unspecified: Secondary | ICD-10-CM

## 2016-12-14 DIAGNOSIS — I10 Essential (primary) hypertension: Secondary | ICD-10-CM | POA: Diagnosis not present

## 2016-12-14 NOTE — Progress Notes (Signed)
Subjective:    Patient ID: Heidi Torres, female    DOB: 28-May-1955, 61 y.o.   MRN: [id number] Chief Complaint  Patient presents with  . New Patient (Initial Visit)    ref [place] uncontrolled blood pressure   Presents as a new patient referred by Dr. Netty Starring for evaluation of possible renal artery stenosis.  Patient with a recent diagnosis of pneumonia - during workup found to possibly have neoplastic etiology to the lung and kidney.  The patient will be undergoing a CT of the abdomen tomorrow.  The order for this CT in EPIC states with and without contrast.  The patient was never "told about any contrast" and is unsure if the CT scan will be with PO / IV contrast.  The patient is currently on 5 antihypertensive medications and is experiencing difficulties controlling her hypertension.  The patient generally experiences systolic blood pressures in the 160s and 170s.  There is no recent renal function in epic or in the referral paperwork. Patient denies any kidney disease.  The patient denies any fever, nausea vomiting.   Review of Systems  Constitutional: Negative.   HENT: Negative.   Eyes: Negative.   Respiratory: Negative.   Cardiovascular:       Uncontrollable hypertension  Gastrointestinal: Negative.   Endocrine: Negative.   Genitourinary: Negative.   Musculoskeletal: Negative.   Skin: Negative.   Allergic/Immunologic: Negative.   Neurological: Negative.   Hematological: Negative.   Psychiatric/Behavioral: Negative.       Objective:   Physical Exam  Constitutional: She appears well-developed and well-nourished. No distress.  HENT:  Head: Normocephalic and atraumatic.  Eyes: Conjunctivae are normal. Pupils are equal, round, and reactive to light.  Neck: Normal range of motion.  Cardiovascular: Normal rate, regular rhythm, normal heart sounds and intact distal pulses.  Pulses:      Radial pulses are 2+ on the right side, and 2+ on the left side.       Dorsalis pedis  pulses are 2+ on the right side, and 2+ on the left side.       Posterior tibial pulses are 2+ on the right side, and 2+ on the left side.  Pulmonary/Chest: Effort normal and breath sounds normal.  Musculoskeletal: Normal range of motion. She exhibits no edema.  Neurological: She is alert.  Skin: Skin is dry. She is not diaphoretic.  Psychiatric: She has a normal mood and affect. Her behavior is normal. Judgment and thought content normal.  Vitals reviewed.  BP 118/67 (BP Location: Right Arm)   Pulse 66   Resp 17   Ht 5' 6" (1.676 m)   Wt 161 lb 6.4 oz (73.2 kg)   LMP  (LMP Unknown)   BMI 26.05 kg/m   Past Medical History:  Diagnosis Date  . GERD (gastroesophageal reflux disease)   . Hypertension   . Irregular heart beat    Social History   Socioeconomic History  . Marital status: Married    Spouse name: Not on file  . Number of children: Not on file  . Years of education: Not on file  . Highest education level: Not on file  Social Needs  . Financial resource strain: Not on file  . Food insecurity - worry: Not on file  . Food insecurity - inability: Not on file  . Transportation needs - medical: Not on file  . Transportation needs - non-medical: Not on file  Occupational History  . Not on file  Tobacco Use  . Smoking  status: Never Smoker  . Smokeless tobacco: Never Used  Substance and Sexual Activity  . Alcohol use: No  . Drug use: No  . Sexual activity: Not on file  Other Topics Concern  . Not on file  Social History Narrative  . Not on file   Past Surgical History:  Procedure Laterality Date  . ABDOMINAL HYSTERECTOMY    . CORONARY ANGIOPLASTY    . DILATION AND CURETTAGE OF UTERUS    . NASAL ENDOSCOPY WITH EPISTAXIS CONTROL    . TUBAL LIGATION     Family History  Problem Relation Age of Onset  . Hypertension Brother    Allergies  Allergen Reactions  . Ciprofloxacin Other (See Comments)    Arms were numb      Assessment & Plan:  Presents as a new  patient referred by Dr. Netty Starring for evaluation of possible renal artery stenosis.  Patient with a recent diagnosis of pneumonia - during workup found to possibly have neoplastic etiology to the lung and kidney.  The patient will be undergoing a CT of the abdomen tomorrow.  The order for this CT in EPIC states with and without contrast.  The patient was never "told about any contrast" and is unsure if the CT scan will be with PO / IV contrast.  The patient is currently on 5 antihypertensive medications and is experiencing difficulties controlling her hypertension.  The patient generally experiences systolic blood pressures in the 160s and 170s.  There is no recent renal function in epic or in the referral paperwork. Patient denies any kidney disease.  The patient denies any fever, nausea vomiting.  1. Hypertension, uncontrolled - New The patient has multiple risk factors for atherosclerotic disease The patient is on 5 antihypertensive medications and still experiences elevated systolic blood pressures of 160-170 The patient is undergoing a CT of the abdomen tomorrow however it is not a CTA and I do not know if the current order is with contrast.  I will order a renal artery duplex to assess for any renal artery stenosis  - VAS US RENAL ARTERY DUPLEX; Future  Of note: Due to the patient's recent illness, she has met her deductible as well as used many sick days.  Patient asking to be scheduled ASAP for renal artery duplex to assess for any renal artery stenosis.  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  There is a 2:00pm slot for renal artery ultrasound.  The patient understands that she will need to be n.p.o. for this ultrasound.  I understand this is later than we normally schedule however in an effort to help the patient I will place her in the slot.  Plan discussed with the office manager and lead ultrasonographer.  2. Hyperlipidemia, unspecified hyperlipidemia type -  Stable Encouraged good control as its slows the progression of atherosclerotic disease  Current Outpatient Medications on File Prior to Visit  Medication Sig Dispense Refill  . ADVAIR DISKUS 100-50 MCG/DOSE AEPB Take 1 puff by mouth daily.    Marland Kitchen ALPRAZolam (XANAX) 0.25 MG tablet Take 0.25 mg by mouth daily as needed. Anxiety.  5  . amLODipine (NORVASC) 10 MG tablet Take 10 mg by mouth daily.  3  . amoxicillin-clavulanate (AUGMENTIN) 875-125 MG tablet Take 1 tablet by mouth every 12 (twelve) hours.  0  . aspirin EC 81 MG tablet Take 81 mg by mouth every other day.    Marland Kitchen atorvastatin (LIPITOR) 20 MG tablet Take by mouth.    Marland Kitchen  azithromycin (ZITHROMAX) 250 MG tablet Take 2 tablets (546m) by mouth on Day 1. Take 1 tablet (2547m by mouth on Days 2-5.    . benzonatate (TESSALON) 100 MG capsule TAKE ONE CAPSULE BY MOUTH 3 TIMES A DAY AS NEEDED FOR COUGH  0  . calcium carbonate (OS-CAL) 600 MG TABS tablet Take 600 mg by mouth daily.    . Cholecalciferol (VITAMIN D3) 5000 UNITS TABS Take 5,000 Units by mouth daily.    . Marland Kitchenstradiol (ESTRACE) 2 MG tablet Take 2 mg by mouth daily.  6  . guaiFENesin (MUCINEX) 600 MG 12 hr tablet Take by mouth 2 (two) times daily as needed.    . metoprolol succinate (TOPROL-XL) 50 MG 24 hr tablet Take 50 mg by mouth daily as needed. Take with or immediately following a meal.     . montelukast (SINGULAIR) 10 MG tablet Take 10 mg by mouth daily.    . pantoprazole (PROTONIX) 40 MG tablet Take 40 mg by mouth 2 (two) times daily.     . potassium chloride (K-DUR) 10 MEQ tablet Take 10 mEq by mouth 2 (two) times daily.    . predniSONE (DELTASONE) 20 MG tablet Take 40 mg by mouth daily.  0  . PROAIR HFA 108 (90 BASE) MCG/ACT inhaler Inhale 2 puffs into the lungs every 6 (six) hours as needed. For shortness of breath and wheezing.    . sucralfate (CARAFATE) 1 G tablet Take by mouth 2 (two) times daily.     . Marland Kitchenriamterene-hydrochlorothiazide (DYAZIDE) 37.5-25 MG per capsule Take 1  capsule by mouth daily.    . valsartan (DIOVAN) 160 MG tablet TAKE 1 TABLET (160 MG TOTAL) BY MOUTH ONCE DAILY.  1  . albuterol (PROVENTIL HFA) 108 (90 Base) MCG/ACT inhaler Inhale into the lungs.    . cyclobenzaprine (FLEXERIL) 10 MG tablet Take 1 tablet (10 mg total) by mouth 3 (three) times daily as needed for muscle spasms. (Patient not taking: Reported on 12/14/2016) 21 tablet 0  . KLOR-CON M10 10 MEQ tablet Take 20 mEq by mouth daily.    . Marland Kitchenosartan (COZAAR) 100 MG tablet Take 100 mg by mouth daily.     No current facility-administered medications on file prior to visit.    There are no Patient Instructions on file for this visit. No Follow-up on file.   A , PA-C

## 2016-12-15 ENCOUNTER — Encounter (INDEPENDENT_AMBULATORY_CARE_PROVIDER_SITE_OTHER): Payer: Self-pay | Admitting: Vascular Surgery

## 2016-12-15 ENCOUNTER — Ambulatory Visit (INDEPENDENT_AMBULATORY_CARE_PROVIDER_SITE_OTHER): Payer: BC Managed Care – PPO | Admitting: Vascular Surgery

## 2016-12-15 ENCOUNTER — Ambulatory Visit
Admission: RE | Admit: 2016-12-15 | Discharge: 2016-12-15 | Disposition: A | Discharge status: Home / Self Care | Payer: BC Managed Care – PPO | Source: Ambulatory Visit | Attending: Family Medicine | Admitting: Family Medicine

## 2016-12-15 ENCOUNTER — Ambulatory Visit (INDEPENDENT_AMBULATORY_CARE_PROVIDER_SITE_OTHER): Payer: BC Managed Care – PPO

## 2016-12-15 VITALS — BP 153/83 | HR 81 | Resp 17 | Ht 66.0 in | Wt 154.0 lb

## 2016-12-15 DIAGNOSIS — E782 Mixed hyperlipidemia: Secondary | ICD-10-CM | POA: Diagnosis not present

## 2016-12-15 DIAGNOSIS — I10 Essential (primary) hypertension: Secondary | ICD-10-CM | POA: Diagnosis not present

## 2016-12-15 DIAGNOSIS — A310 Pulmonary mycobacterial infection: Secondary | ICD-10-CM | POA: Diagnosis not present

## 2016-12-15 DIAGNOSIS — D3501 Benign neoplasm of right adrenal gland: Secondary | ICD-10-CM | POA: Diagnosis not present

## 2016-12-15 DIAGNOSIS — J189 Pneumonia, unspecified organism: Secondary | ICD-10-CM | POA: Diagnosis not present

## 2016-12-15 DIAGNOSIS — R9389 Abnormal findings on diagnostic imaging of other specified body structures: Secondary | ICD-10-CM | POA: Diagnosis not present

## 2016-12-15 DIAGNOSIS — N2889 Other specified disorders of kidney and ureter: Secondary | ICD-10-CM | POA: Diagnosis not present

## 2016-12-15 DIAGNOSIS — N289 Disorder of kidney and ureter, unspecified: Secondary | ICD-10-CM

## 2016-12-15 DIAGNOSIS — I700 Atherosclerosis of aorta: Secondary | ICD-10-CM | POA: Insufficient documentation

## 2016-12-15 DIAGNOSIS — E785 Hyperlipidemia, unspecified: Secondary | ICD-10-CM | POA: Diagnosis present

## 2016-12-15 DIAGNOSIS — J449 Chronic obstructive pulmonary disease, unspecified: Secondary | ICD-10-CM | POA: Diagnosis not present

## 2016-12-15 HISTORY — DX: Pulmonary mycobacterial infection: A31.0

## 2016-12-15 MED ORDER — IOPAMIDOL (ISOVUE-370) INJECTION 76%
100.0000 mL | Freq: Once | INTRAVENOUS | Status: AC | PRN
  Administered 2016-12-15: 100 mL via INTRAVENOUS

## 2016-12-15 NOTE — Progress Notes (Signed)
MRN : [id number]  Heidi Torres is a 61 y.o. (1955-08-05) female who presents with chief complaint of No chief complaint on file. Marland Kitchen  History of Present Illness: The patient returns to the office for followup and review of the noninvasive studies regarding renal vascular hypertension and renal artery stenosis. There have been no interval changes in the patient's blood pressure control.  He denies any major changes in is medications.  The patient is currently on 5 antihypertensive medications and is experiencing difficulties controlling her hypertension.  The patient generally experiences systolic blood pressures in the 160s and 170s.  There is no recent renal function in epic or in the referral paperwork. Patient denies any kidney disease.  The patient denies any fever, nausea vomiting.The patient denies headache or flushing.  No flank or unusual back pain.    There have been no significant changes to the patient's overall health care.  No interval shortening of the patient's walking distance or new symptoms consistent with claudication.  The patient denies the  development of rest pain symptoms. No new ulcers or wounds have occurred since the last visit.  The patient denies amaurosis fugax or recent TIA symptoms. There are no recent neurological changes noted. The patient denies history of DVT, PE or superficial thrombophlebitis. The patient denies recent episodes of angina or shortness of breath.   Duplex ultrasound of the renal arteries is negative for RAS CT with contrast confirms this as well   No outpatient medications have been marked as taking for the 12/15/16 encounter (Appointment) with Delana Meyer, Dolores Lory, MD.    Past Medical History:  Diagnosis Date  . GERD (gastroesophageal reflux disease)   . Hypertension   . Irregular heart beat     Past Surgical History:  Procedure Laterality Date  . ABDOMINAL HYSTERECTOMY    . CORONARY ANGIOPLASTY    . DILATION AND CURETTAGE OF  UTERUS    . NASAL ENDOSCOPY WITH EPISTAXIS CONTROL    . TUBAL LIGATION      Social History Social History   Tobacco Use  . Smoking status: Never Smoker  . Smokeless tobacco: Never Used  Substance Use Topics  . Alcohol use: No  . Drug use: No    Family History Family History  Problem Relation Age of Onset  . Hypertension Brother     Allergies  Allergen Reactions  . Ciprofloxacin Other (See Comments)    Arms were numb     REVIEW OF SYSTEMS (Negative unless checked)  Constitutional: [] Weight loss  [] Fever  [] Chills Cardiac: [] Chest pain   [] Chest pressure   [] Palpitations   [] Shortness of breath when laying flat   [] Shortness of breath with exertion. Vascular:  [] Pain in legs with walking   [] Pain in legs at rest  [] History of DVT   [] Phlebitis   [] Swelling in legs   [] Varicose veins   [] Non-healing ulcers Pulmonary:   [] Uses home oxygen   [] Productive cough   [] Hemoptysis   [] Wheeze  [] COPD   [] Asthma Neurologic:  [] Dizziness   [] Seizures   [] History of stroke   [] History of TIA  [] Aphasia   [] Vissual changes   [] Weakness or numbness in arm   [] Weakness or numbness in leg Musculoskeletal:   [] Joint swelling   [] Joint pain   [] Low back pain Hematologic:  [] Easy bruising  [] Easy bleeding   [] Hypercoagulable state   [] Anemic Gastrointestinal:  [] Diarrhea   [] Vomiting  [] Gastroesophageal reflux/heartburn   [] Difficulty swallowing. Genitourinary:  [] Chronic kidney disease   []   Difficult urination  [] Frequent urination   [] Blood in urine Skin:  [] Rashes   [] Ulcers  Psychological:  [] History of anxiety   []  History of major depression.  Physical Examination  There were no vitals filed for this visit. There is no height or weight on file to calculate BMI. Gen: WD/WN, NAD Head: [place]/AT, No temporalis wasting.  Ear/Nose/Throat: Hearing grossly intact, nares w/o erythema or drainage Eyes: PER, EOMI, sclera nonicteric.  Neck: Supple, no large masses.   Pulmonary:  Good air  movement, no audible wheezing bilaterally, no use of accessory muscles.  Cardiac: RRR, no JVD Vascular: Vessel Right Left  Radial Palpable Palpable  PT Palpable Palpable  DP Palpable Palpable  Gastrointestinal: Non-distended. No guarding/no peritoneal signs.  Musculoskeletal: M/S 5/5 throughout.  No deformity or atrophy.  Neurologic: CN 2-12 intact. Symmetrical.  Speech is fluent. Motor exam as listed above. Psychiatric: Judgment intact, Mood & affect appropriate for pt's clinical situation. Dermatologic: No rashes or ulcers noted.  No changes consistent with cellulitis. Lymph : No lichenification or skin changes of chronic lymphedema.  CBC Lab Results  Component Value Date   WBC 6.1 09/07/2014   HGB 12.9 09/07/2014   HCT 38.0 09/07/2014   MCV 85.0 09/07/2014   PLT 250 09/07/2014    BMET    Component Value Date/Time   NA 139 09/07/2014 0952   K 3.1 (L) 09/07/2014 0952   CL 106 09/07/2014 0952   CO2 27 09/07/2014 0952   GLUCOSE 101 (H) 09/07/2014 0952   BUN 20 09/07/2014 0952   CREATININE 0.67 09/07/2014 0952   CALCIUM 9.1 09/07/2014 0952   GFRNONAA >60 09/07/2014 0952   GFRAA >60 09/07/2014 0952   CrCl cannot be calculated (Patient's most recent lab result is older than the maximum 21 days allowed.).  COAG No results found for: INR, PROTIME  Radiology Ct Chest Wo Contrast  Result Date: 11/29/2016 CLINICAL DATA:  Cough and congestion. EXAM: CT CHEST WITHOUT CONTRAST TECHNIQUE: Multidetector CT imaging of the chest was performed following the standard protocol without IV contrast. COMPARISON:  None. FINDINGS: Cardiovascular: Heart size upper normal. Trace pericardial effusion evident. Mediastinum/Nodes: Scattered upper normal mediastinal lymph nodes evident. Index 9 mm short axis right paratracheal lymph nodes seen image 54 series 2. 9 mm short axis right hilar lymph node visualized on image 72. The esophagus has normal imaging features. There is no axillary  lymphadenopathy. Lungs/Pleura: Right upper lobe tree-in-bud nodularity is associated with patchy ground-glass attenuation and bronchial wall thickening. Similar findings are evident in the right middle and lower lobes. Subsegmental atelectasis noted in the lingula. No pleural effusion. Upper Abdomen: Rounded expansion of the left anterior kidney in the interpolar region noted. Musculoskeletal: Bone windows reveal no worrisome lytic or sclerotic osseous lesions. IMPRESSION: 1. Central tree-in-bud opacity with scattered nodularity and patchy ground-glass attenuation identified in the right upper, middle, and lower lobes. Imaging features suggest atypical infection including MAI. Given the associated nodularity, repeat CT after therapy recommended to assess for resolution. 2. Rounded expansion of the anterior interpolar left kidney, incompletely visualized and incompletely characterized on this noncontrast exam. While this may be normal anatomy, mass lesion cannot be excluded. Dedicated abdominal CT with contrast material recommended to further evaluate. 3. Borderline mediastinal and right hilar lymphadenopathy, likely reactive. These results will be called to the ordering clinician or representative by the Radiologist Assistant, and communication documented in the PACS or zVision Dashboard. Electronically Signed   By: Misty Stanley M.D.   On: 11/29/2016 10:15  Assessment/Plan 1. Malignant hypertension BP is acceptable today No evidence of vascular pathology to explain her hypertension  The patient's vital signs and noninvasive studies support the renal artery stenosis is not significantly increased when compared to the previous study.  No invasive studies or intervention is indicated at this time.  The patient will continue the current antihypertensive medications, no changes at this time.  The primary medical service will continue aggressive antihypertensive therapy as per the AHA guidelines   2.  Mixed hyperlipidemia Continue statin as ordered and reviewed, no changes at this time   3. Chronic obstructive pulmonary disease, unspecified COPD type ([place]) Continue pulmonary medications and aerosols as already ordered, these medications have been reviewed and there are no changes at this time.     Hortencia Pilar, MD  12/15/2016 1:49 PM

## 2016-12-20 DIAGNOSIS — D4102 Neoplasm of uncertain behavior of left kidney: Secondary | ICD-10-CM | POA: Diagnosis not present

## 2016-12-21 ENCOUNTER — Other Ambulatory Visit: Payer: Self-pay | Admitting: Urology

## 2016-12-21 DIAGNOSIS — D4102 Neoplasm of uncertain behavior of left kidney: Secondary | ICD-10-CM

## 2016-12-22 DIAGNOSIS — R05 Cough: Secondary | ICD-10-CM | POA: Diagnosis not present

## 2016-12-22 DIAGNOSIS — Z1283 Encounter for screening for malignant neoplasm of skin: Secondary | ICD-10-CM | POA: Diagnosis not present

## 2016-12-22 DIAGNOSIS — L820 Inflamed seborrheic keratosis: Secondary | ICD-10-CM | POA: Diagnosis not present

## 2016-12-22 DIAGNOSIS — J4520 Mild intermittent asthma, uncomplicated: Secondary | ICD-10-CM | POA: Diagnosis not present

## 2016-12-22 DIAGNOSIS — R0602 Shortness of breath: Secondary | ICD-10-CM | POA: Diagnosis not present

## 2016-12-22 DIAGNOSIS — L570 Actinic keratosis: Secondary | ICD-10-CM | POA: Diagnosis not present

## 2016-12-22 DIAGNOSIS — D485 Neoplasm of uncertain behavior of skin: Secondary | ICD-10-CM | POA: Diagnosis not present

## 2016-12-22 DIAGNOSIS — D229 Melanocytic nevi, unspecified: Secondary | ICD-10-CM | POA: Diagnosis not present

## 2016-12-29 ENCOUNTER — Ambulatory Visit
Admission: RE | Admit: 2016-12-29 | Discharge: 2016-12-29 | Disposition: A | Discharge status: Home / Self Care | Payer: BC Managed Care – PPO | Source: Ambulatory Visit | Attending: Urology | Admitting: Urology

## 2016-12-29 DIAGNOSIS — D4102 Neoplasm of uncertain behavior of left kidney: Secondary | ICD-10-CM

## 2016-12-29 DIAGNOSIS — N289 Disorder of kidney and ureter, unspecified: Secondary | ICD-10-CM | POA: Diagnosis not present

## 2016-12-29 MED ORDER — GADOBENATE DIMEGLUMINE 529 MG/ML IV SOLN
14.0000 mL | Freq: Once | INTRAVENOUS | Status: AC | PRN
  Administered 2016-12-29: 14 mL via INTRAVENOUS

## 2017-01-01 ENCOUNTER — Other Ambulatory Visit: Payer: BC Managed Care – PPO

## 2017-01-03 DIAGNOSIS — C642 Malignant neoplasm of left kidney, except renal pelvis: Secondary | ICD-10-CM

## 2017-01-03 HISTORY — DX: Malignant neoplasm of left kidney, except renal pelvis: C64.2

## 2017-01-10 ENCOUNTER — Other Ambulatory Visit: Payer: Self-pay | Admitting: Urology

## 2017-01-17 NOTE — Patient Instructions (Signed)
Klover Priestly  01/17/2017   Your procedure is scheduled on: 01-20-17  Report to [facility] Main  Entrance  Follow signs to Short Stay on first floor at 530 AM  Call this number if you have problems the morning of surgery [phone]    Remember: Do not eat food or drink liquids :After Midnight.     Take these medicines the morning of surgery with A SIP OF WATER: INHALERS AND BRING, DEXILANT, ATORVASTATIN, AMLODIPINE                                 You may not have any metal on your body including hair pins and              piercings  Do not wear jewelry, make-up, lotions, powders or perfumes, deodorant             Do not wear nail polish.  Do not shave  48 hours prior to surgery.             Do not bring valuables to the hospital. [place].  Contacts, dentures or bridgework may not be worn into surgery.      Patients discharged the day of surgery will not be allowed to drive home.  Name and phone number of your driver:  Special Instructions: N/A              Please read over the following fact sheets you were given: _____________________________________________________________________           [facility] - Preparing for Surgery Before surgery, you can play an important role.  Because skin is not sterile, your skin needs to be as free of germs as possible.  You can reduce the number of germs on your skin by washing with CHG (chlorahexidine gluconate) soap before surgery.  CHG is an antiseptic cleaner which kills germs and bonds with the skin to continue killing germs even after washing. Please DO NOT use if you have an allergy to CHG or antibacterial soaps.  If your skin becomes reddened/irritated stop using the CHG and inform your nurse when you arrive at Short Stay. Do not shave (including legs and underarms) for at least 48 hours prior to the first CHG shower.  You may shave your face/neck. Please  follow these instructions carefully:  1.  Shower with CHG Soap the night before surgery and the  morning of Surgery.  2.  If you choose to wash your hair, wash your hair first as usual with your  normal  shampoo.  3.  After you shampoo, rinse your hair and body thoroughly to remove the  shampoo.                           4.  Use CHG as you would any other liquid soap.  You can apply chg directly  to the skin and wash                       Gently with a scrungie or clean washcloth.  5.  Apply the CHG Soap to your body ONLY FROM THE NECK DOWN.   Do not use on face/ open  Wound or open sores. Avoid contact with eyes, ears mouth and genitals (private parts).                       Wash face,  Genitals (private parts) with your normal soap.             6.  Wash thoroughly, paying special attention to the area where your surgery  will be performed.  7.  Thoroughly rinse your body with warm water from the neck down.  8.  DO NOT shower/wash with your normal soap after using and rinsing off  the CHG Soap.                9.  Pat yourself dry with a clean towel.            10.  Wear clean pajamas.            11.  Place clean sheets on your bed the night of your first shower and do not  sleep with pets. Day of Surgery : Do not apply any lotions/deodorants the morning of surgery.  Please wear clean clothes to the hospital/surgery center.  FAILURE TO FOLLOW THESE INSTRUCTIONS MAY RESULT IN THE CANCELLATION OF YOUR SURGERY PATIENT SIGNATURE_________________________________  NURSE SIGNATURE__________________________________  ________________________________________________________________________

## 2017-01-17 NOTE — Progress Notes (Addendum)
EKG 10-14-17 Epic   CT CHEST 11-29-16 Epic   12-22-16 OVN PULM. Epic  12-15-16 [facility] VASCULAR Epic  pft'S 12-22-16 Epic CARE EVERYWHERE  OVN CARDS 10-18-16 Epic  ECHO 11-02-16 Epic CARE EVERYWHERE

## 2017-01-18 ENCOUNTER — Encounter (HOSPITAL_COMMUNITY): Payer: Self-pay

## 2017-01-18 ENCOUNTER — Other Ambulatory Visit: Payer: Self-pay

## 2017-01-18 ENCOUNTER — Encounter (INDEPENDENT_AMBULATORY_CARE_PROVIDER_SITE_OTHER): Payer: Self-pay

## 2017-01-18 ENCOUNTER — Encounter (HOSPITAL_COMMUNITY)
Admission: RE | Admit: 2017-01-18 | Discharge: 2017-01-18 | Disposition: A | Discharge status: Home / Self Care | Payer: BC Managed Care – PPO | Source: Ambulatory Visit | Attending: Urology | Admitting: Urology

## 2017-01-18 DIAGNOSIS — Z79899 Other long term (current) drug therapy: Secondary | ICD-10-CM | POA: Diagnosis not present

## 2017-01-18 DIAGNOSIS — N2889 Other specified disorders of kidney and ureter: Secondary | ICD-10-CM | POA: Diagnosis present

## 2017-01-18 DIAGNOSIS — I4891 Unspecified atrial fibrillation: Secondary | ICD-10-CM | POA: Diagnosis not present

## 2017-01-18 DIAGNOSIS — Z7982 Long term (current) use of aspirin: Secondary | ICD-10-CM | POA: Diagnosis not present

## 2017-01-18 DIAGNOSIS — J45909 Unspecified asthma, uncomplicated: Secondary | ICD-10-CM | POA: Diagnosis not present

## 2017-01-18 DIAGNOSIS — I10 Essential (primary) hypertension: Secondary | ICD-10-CM | POA: Diagnosis not present

## 2017-01-18 HISTORY — DX: Cardiac murmur, unspecified: R01.1

## 2017-01-18 HISTORY — DX: Adverse effect of unspecified anesthetic, initial encounter: T41.45XA

## 2017-01-18 HISTORY — DX: Personal history of other diseases of the digestive system: Z87.19

## 2017-01-18 HISTORY — DX: Unspecified asthma, uncomplicated: J45.909

## 2017-01-18 HISTORY — DX: Pneumonia, unspecified organism: J18.9

## 2017-01-18 HISTORY — DX: Headache: R51

## 2017-01-18 HISTORY — DX: Other complications of anesthesia, initial encounter: T88.59XA

## 2017-01-18 HISTORY — DX: Headache, unspecified: R51.9

## 2017-01-18 LAB — CBC
HEMATOCRIT: 37.1 % (ref 36.0–46.0)
HEMOGLOBIN: 12.2 g/dL (ref 12.0–15.0)
MCH: 28.7 pg (ref 26.0–34.0)
MCHC: 32.9 g/dL (ref 30.0–36.0)
MCV: 87.3 fL (ref 78.0–100.0)
Platelets: 266 10*3/uL (ref 150–400)
RBC: 4.25 MIL/uL (ref 3.87–5.11)
RDW: 13.3 % (ref 11.5–15.5)
WBC: 7.6 10*3/uL (ref 4.0–10.5)

## 2017-01-18 LAB — BASIC METABOLIC PANEL
ANION GAP: 7 (ref 5–15)
BUN: 22 mg/dL — AB (ref 6–20)
CO2: 28 mmol/L (ref 22–32)
Calcium: 9.1 mg/dL (ref 8.9–10.3)
Chloride: 102 mmol/L (ref 101–111)
Creatinine, Ser: 0.63 mg/dL (ref 0.44–1.00)
Glucose, Bld: 99 mg/dL (ref 65–99)
POTASSIUM: 3.3 mmol/L — AB (ref 3.5–5.1)
SODIUM: 137 mmol/L (ref 135–145)

## 2017-01-18 NOTE — Progress Notes (Signed)
BMP done 01-18-17 routed to Dr. Junious Silk via epic.

## 2017-01-19 NOTE — H&P (Signed)
Office Visit Report     12/20/2016   --------------------------------------------------------------------------------   Heidi Torres  MRN: [id number]  PRIMARY CARE:  [name], MD  DOB: Apr 29, 1955, 62 year old Female  REFERRING:  Emily Filbert, MD  SSN: -[id number]  PROVIDER:  Festus Aloe, M.D.    LOCATION:  [facility] [phone]   --------------------------------------------------------------------------------   CC: I have a renal mass.  HPI: Heidi Torres is a 62 year-old female established patient who is here for a renal mass.  The problem is on the left side. Her problem was diagnosed 12/16/2016. She has had no symptoms. She had the following x-rays done: CT Scan. She has not had kidney surgery.   She has not seen blood in her urine. She does have a good appetite. BOWEL HABITS: her bowels are not moving normally. She has not recently had unwanted weight loss.   Patient had issues with atypical mycoplasma pneumonia and underwent follow-up CT scan of the chest November 29, 2016. This revealed a masslike appearance of the left kidney. Therefore she underwent a CT scan of the abdomen without and with contrast December 15, 2016 which revealed this to be normal renal parenchyma. However, there was a subtle 1.8 x 2.1 cm enhancing lesion in the mid left kidney, medial and posterior to the renal pelvis. It does not appear to involve the collecting system. She's had no gross hematuria or flank pain. No frequency. She has occasional bladder pressure with a full bladder. She was seen in the past for recurrent UTI. Interestingly, recently she's had higher blood pressure and needed to be on triple therapy for hypertension.   Significant past medical history includes A. fib. and asthma. She was seen in the past for recurrent urinary tract infection.     ALLERGIES: Cipro TABS    MEDICATIONS: Advair Diskus  Amlodipine Besylate 10 mg tablet  Aspirin Ec 81 mg tablet,  delayed release Oral  Calcium TABS Oral  Estradiol 2 mg tablet Oral  Klor-Con 10 10 meq tablet, extended release Oral  Magnesium 400 mg capsule Oral  Metoprolol Tartrate 50 mg tablet Oral  Pantoprazole Sodium 40 mg tablet, delayed release Oral  Singulair  Sucralfate 1 gram tablet Oral  Triamterene-Hydrochlorothiazid 37.5 mg-25 mg capsule Oral  Valsartan-Hydrochlorothiazide 160 mg-12.5 mg tablet Oral  Vitamin C 500 mg tablet Oral  Vitamin D-3 TABS Oral     GU PSH: Hysterectomy Unilat SO - 2016 Sling - 2016      [place] Notes: Vaginal Sling Operation For Stress Incontinence, Hysterectomy   NON-GU PSH: None   GU PMH: Dysuria, Dysuria - 2016 Oth GU systems Signs/Symptoms, Urethralgia - 2016 Urinary Tract Inf, Unspec site, E-coli UTI - 2016 Dorsalgia, Unspec, Back pain, acute - 2016    NON-GU PMH: Encounter for general adult medical examination without abnormal findings, Encounter for preventive health examination - 2016 Personal history of other diseases of the circulatory system, History of atrial fibrillation - 2016, History of cardiac arrhythmia, - 2016, History of cardiac murmur, - 2016, History of essential hypertension, - 2016 Personal history of other diseases of the digestive system, History of gastric ulcer - 2016, History of esophageal reflux, - 2016 Personal history of other diseases of the respiratory system, History of asthma - 2016 Personal history of other endocrine, nutritional and metabolic disease, History of hyperlipidemia - 2016    FAMILY HISTORY: Asthma - Runs In Family cardiac arrhythmia - Runs In Family cardiac disorder - Runs In Family Deceased - Runs  In Family Hypertension - Runs In Family Kidney Stones - Runs In Family squamous cell carcinoma - Runs In Family   SOCIAL HISTORY: None    Notes: Never a smoker, Non-smoker, Non-smoker, Caffeine use, Married, No alcohol use, Occupation, Number of children   REVIEW OF SYSTEMS:    GU Review Female:   Patient  denies frequent urination, hard to postpone urination, burning /pain with urination, get up at night to urinate, leakage of urine, stream starts and stops, trouble starting your stream, have to strain to urinate, and being pregnant.  Gastrointestinal (Upper):   Patient denies nausea, vomiting, and indigestion/ heartburn.  Gastrointestinal (Lower):   Patient denies constipation and diarrhea.  Constitutional:   Patient denies fever, night sweats, weight loss, and fatigue.  Skin:   Patient denies skin rash/ lesion and itching.  Eyes:   Patient denies blurred vision and double vision.  Ears/ Nose/ Throat:   Patient denies sore throat and sinus problems.  Hematologic/Lymphatic:   Patient denies swollen glands and easy bruising.  Cardiovascular:   Patient denies leg swelling and chest pains.  Respiratory:   Patient denies cough and shortness of breath.  Endocrine:   Patient denies excessive thirst.  Musculoskeletal:   Patient denies back pain and joint pain.  Neurological:   Patient denies headaches and dizziness.  Psychologic:   Patient denies depression and anxiety.   VITAL SIGNS:      12/20/2016 02:10 PM  Weight 162 lb / 73.48 kg  Height 66 in / 167.64 cm  BP 116/71 mmHg  Pulse 60 /min  BMI 26.1 kg/m   MULTI-SYSTEM PHYSICAL EXAMINATION:    Constitutional: Well-nourished. No physical deformities. Normally developed. Good grooming.  Neck: Neck symmetrical, not swollen. Normal tracheal position.  Respiratory: No labored breathing, no use of accessory muscles.   Cardiovascular: Normal temperature, normal extremity pulses, no swelling, no varicosities.  Neurologic / Psychiatric: Oriented to time, oriented to place, oriented to person. No depression, no anxiety, no agitation.  Gastrointestinal: No mass, no tenderness, no rigidity, non obese abdomen.     PAST DATA REVIEWED:  Source Of History:  Patient  X-Ray Review: C.T. Abdomen/Pelvis: Reviewed Films. 12/2016 x 2    PROCEDURES:           Urinalysis Dipstick Dipstick Cont'd  Color: Yellow Bilirubin: Neg  Appearance: Clear Ketones: Neg  Specific Gravity: 1.020 Blood: Neg  pH: 6.0 Protein: Neg  Glucose: Neg Urobilinogen: 0.2    Nitrites: Neg    Leukocyte Esterase: Neg    ASSESSMENT:      ICD-10 Details  1 GU:   Left uncertain neoplasm of kidney - D41.02    PLAN:           Orders Labs BMP          Schedule X-Rays: 2 Weeks - MRI Kidney With and Without I.V. Contrast  Return Visit/Planned Activity: Return PRN - Office Visit, Follow up MD          Document Letter(s):  Created for Patient: Clinical Summary         Notes:   left renal neoplasm of uncertain behavior - Discussed with patient and husband the nature of renal masses-benign and malignant. We discussed the nature risks and benefits of biopsy, surveillance, ablation, partial and radical nephrectomy. This tumor is medial with the renal pelvis just anterior and the vasculature wrapping around it. Check BMP. Obtaub MRI kidney. Pt doesn't want to wait for a period of surveillance right now.   cc:  Dr. Netty Starring     ** Signed by Festus Aloe, M.D. on 12/20/16 at 4:52 PM (EST)**     The information contained in this medical record document is considered private and confidential patient information. This information can only be used for the medical diagnosis and/or medical services that are being provided by the patient's selected caregivers. This information can only be distributed outside of the patient's care if the patient agrees and signs waivers of authorization for this information to be sent to an outside source or route.  Addendum: MRI confirmed a 1.8 cm enhancing solid mass along the medial left kidney likely a renal cell carcinoma however it is along the collecting system without good delayed imaging. Plan cystoscopy with left retrograde pyelogram and left ureteroscopy possible biopsy to ensure no collecting system involvement which would change  management. I discussed with patient.

## 2017-01-20 ENCOUNTER — Ambulatory Visit (HOSPITAL_COMMUNITY)
Admission: RE | Admit: 2017-01-20 | Discharge: 2017-01-20 | Disposition: A | Discharge status: Home / Self Care | Payer: BC Managed Care – PPO | Source: Ambulatory Visit | Attending: Urology | Admitting: Urology

## 2017-01-20 ENCOUNTER — Encounter (HOSPITAL_COMMUNITY): Payer: Self-pay | Admitting: Certified Registered Nurse Anesthetist

## 2017-01-20 ENCOUNTER — Encounter (HOSPITAL_COMMUNITY)
Admission: RE | Disposition: A | Discharge status: Home / Self Care | Payer: Self-pay | Source: Ambulatory Visit | Attending: Urology

## 2017-01-20 ENCOUNTER — Ambulatory Visit (HOSPITAL_COMMUNITY): Payer: BC Managed Care – PPO

## 2017-01-20 ENCOUNTER — Ambulatory Visit (HOSPITAL_COMMUNITY): Payer: BC Managed Care – PPO | Admitting: Certified Registered Nurse Anesthetist

## 2017-01-20 DIAGNOSIS — N2889 Other specified disorders of kidney and ureter: Secondary | ICD-10-CM | POA: Insufficient documentation

## 2017-01-20 DIAGNOSIS — Z7982 Long term (current) use of aspirin: Secondary | ICD-10-CM | POA: Insufficient documentation

## 2017-01-20 DIAGNOSIS — I4891 Unspecified atrial fibrillation: Secondary | ICD-10-CM | POA: Insufficient documentation

## 2017-01-20 DIAGNOSIS — J45909 Unspecified asthma, uncomplicated: Secondary | ICD-10-CM | POA: Insufficient documentation

## 2017-01-20 DIAGNOSIS — Z79899 Other long term (current) drug therapy: Secondary | ICD-10-CM | POA: Insufficient documentation

## 2017-01-20 DIAGNOSIS — I10 Essential (primary) hypertension: Secondary | ICD-10-CM | POA: Insufficient documentation

## 2017-01-20 HISTORY — PX: CYSTOSCOPY/RETROGRADE/URETEROSCOPY: SHX5316

## 2017-01-20 SURGERY — CYSTOSCOPY/RETROGRADE/URETEROSCOPY
Anesthesia: General | Laterality: Left

## 2017-01-20 MED ORDER — OXYCODONE-ACETAMINOPHEN 5-325 MG PO TABS
1.0000 | ORAL_TABLET | Freq: Once | ORAL | Status: AC
  Administered 2017-01-20: 1 via ORAL

## 2017-01-20 MED ORDER — ONDANSETRON HCL 4 MG/2ML IJ SOLN
INTRAMUSCULAR | Status: AC
  Filled 2017-01-20: qty 2

## 2017-01-20 MED ORDER — LACTATED RINGERS IV SOLN
INTRAVENOUS | Status: DC | PRN
  Administered 2017-01-20 (×2): via INTRAVENOUS

## 2017-01-20 MED ORDER — MIDAZOLAM HCL 2 MG/2ML IJ SOLN
INTRAMUSCULAR | Status: AC
  Filled 2017-01-20: qty 2

## 2017-01-20 MED ORDER — ONDANSETRON HCL 4 MG/2ML IJ SOLN
4.0000 mg | Freq: Once | INTRAMUSCULAR | Status: AC | PRN
  Administered 2017-01-20: 4 mg via INTRAVENOUS

## 2017-01-20 MED ORDER — STERILE WATER FOR IRRIGATION IR SOLN
Status: DC | PRN
  Administered 2017-01-20: 3000 mL

## 2017-01-20 MED ORDER — NITROFURANTOIN MACROCRYSTAL 100 MG PO CAPS: 100.0000 mg | ORAL_CAPSULE | Freq: Every day | ORAL | 0 refills | Status: AC

## 2017-01-20 MED ORDER — PROMETHAZINE HCL 25 MG/ML IJ SOLN
INTRAMUSCULAR | Status: AC
  Filled 2017-01-20: qty 1

## 2017-01-20 MED ORDER — ONDANSETRON HCL 4 MG/2ML IJ SOLN
INTRAMUSCULAR | Status: DC | PRN
  Administered 2017-01-20: 4 mg via INTRAVENOUS

## 2017-01-20 MED ORDER — OXYCODONE-ACETAMINOPHEN 5-325 MG PO TABS
ORAL_TABLET | ORAL | Status: AC
  Filled 2017-01-20: qty 1

## 2017-01-20 MED ORDER — MIDAZOLAM HCL 5 MG/5ML IJ SOLN
INTRAMUSCULAR | Status: DC | PRN
  Administered 2017-01-20: 2 mg via INTRAVENOUS

## 2017-01-20 MED ORDER — MEPERIDINE HCL 50 MG/ML IJ SOLN: 6.2500 mg | INTRAMUSCULAR | Status: DC | PRN

## 2017-01-20 MED ORDER — LIDOCAINE 2% (20 MG/ML) 5 ML SYRINGE
INTRAMUSCULAR | Status: AC
  Filled 2017-01-20: qty 5

## 2017-01-20 MED ORDER — LIDOCAINE 2% (20 MG/ML) 5 ML SYRINGE
INTRAMUSCULAR | Status: DC | PRN
  Administered 2017-01-20: 100 mg via INTRAVENOUS

## 2017-01-20 MED ORDER — PROMETHAZINE HCL 25 MG/ML IJ SOLN
6.2500 mg | Freq: Once | INTRAMUSCULAR | Status: AC
  Administered 2017-01-20: 6.25 mg via INTRAVENOUS

## 2017-01-20 MED ORDER — IOPAMIDOL (ISOVUE-300) INJECTION 61%
INTRAVENOUS | Status: DC | PRN
  Administered 2017-01-20: 30 mL

## 2017-01-20 MED ORDER — FENTANYL CITRATE (PF) 100 MCG/2ML IJ SOLN
INTRAMUSCULAR | Status: AC
  Filled 2017-01-20: qty 2

## 2017-01-20 MED ORDER — LACTATED RINGERS IV SOLN: INTRAVENOUS | Status: DC

## 2017-01-20 MED ORDER — PROPOFOL 10 MG/ML IV BOLUS
INTRAVENOUS | Status: AC
  Filled 2017-01-20: qty 20

## 2017-01-20 MED ORDER — SODIUM CHLORIDE 0.9 % IR SOLN
Status: DC | PRN
  Administered 2017-01-20: 3000 mL
  Administered 2017-01-20: 1000 mL

## 2017-01-20 MED ORDER — HYDROMORPHONE HCL 1 MG/ML IJ SOLN: 0.2500 mg | INTRAMUSCULAR | Status: DC | PRN

## 2017-01-20 MED ORDER — CEFAZOLIN SODIUM-DEXTROSE 2-4 GM/100ML-% IV SOLN
2.0000 g | Freq: Once | INTRAVENOUS | Status: AC
  Administered 2017-01-20: 2 g via INTRAVENOUS
  Filled 2017-01-20: qty 100

## 2017-01-20 MED ORDER — FENTANYL CITRATE (PF) 100 MCG/2ML IJ SOLN
INTRAMUSCULAR | Status: DC | PRN
  Administered 2017-01-20: 50 ug via INTRAVENOUS

## 2017-01-20 MED ORDER — PROPOFOL 10 MG/ML IV BOLUS
INTRAVENOUS | Status: DC | PRN
  Administered 2017-01-20: 150 mg via INTRAVENOUS

## 2017-01-20 MED ORDER — DEXAMETHASONE SODIUM PHOSPHATE 10 MG/ML IJ SOLN
INTRAMUSCULAR | Status: DC | PRN
  Administered 2017-01-20: 10 mg via INTRAVENOUS

## 2017-01-20 SURGICAL SUPPLY — 21 items
BAG URO CATCHER STRL LF (MISCELLANEOUS) ×2 IMPLANT
BASKET ZERO TIP NITINOL 2.4FR (BASKET) ×2 IMPLANT
BSKT STON RTRVL ZERO TP 2.4FR (BASKET) ×1
CATH INTERMIT  6FR 70CM (CATHETERS) ×2 IMPLANT
CATH URET 5FR 28IN CONE TIP (BALLOONS) ×1
CATH URET 5FR 70CM CONE TIP (BALLOONS) ×1 IMPLANT
CATH URET WHISTLE 6FR (CATHETERS) ×2 IMPLANT
CLOTH BEACON ORANGE TIMEOUT ST (SAFETY) ×2 IMPLANT
COVER FOOTSWITCH UNIV (MISCELLANEOUS) IMPLANT
COVER SURGICAL LIGHT HANDLE (MISCELLANEOUS) ×2 IMPLANT
GLOVE BIO SURGEON STRL SZ7.5 (GLOVE) ×2 IMPLANT
GOWN STRL REUS W/TWL XL LVL3 (GOWN DISPOSABLE) ×2 IMPLANT
GUIDEWIRE ANG ZIPWIRE 038X150 (WIRE) ×1 IMPLANT
GUIDEWIRE STR DUAL SENSOR (WIRE) ×3 IMPLANT
MANIFOLD NEPTUNE II (INSTRUMENTS) ×2 IMPLANT
PACK CYSTO (CUSTOM PROCEDURE TRAY) ×2 IMPLANT
SHEATH ACCESS URETERAL 24CM (SHEATH) ×2 IMPLANT
SHEATH URETERAL 12FRX35CM (MISCELLANEOUS) ×1 IMPLANT
STENT URET 6FRX26 CONTOUR (STENTS) ×1 IMPLANT
TUBING CONNECTING 10 (TUBING) ×2 IMPLANT
WIRE COONS/BENSON .038X145CM (WIRE) ×2 IMPLANT

## 2017-01-20 NOTE — Anesthesia Procedure Notes (Signed)
Procedure Name: LMA Insertion Date/Time: 01/20/2017 7:48 AM Performed by: Montel Clock, CRNA Pre-anesthesia Checklist: Patient identified, Emergency Drugs available, Suction available, Patient being monitored and Timeout performed Patient Re-evaluated:Patient Re-evaluated prior to induction Oxygen Delivery Method: Circle system utilized Preoxygenation: Pre-oxygenation with 100% oxygen Induction Type: IV induction Ventilation: Mask ventilation without difficulty LMA: LMA with gastric port inserted LMA Size: 4.0 Number of attempts: 1 Dental Injury: Teeth and Oropharynx as per pre-operative assessment

## 2017-01-20 NOTE — Anesthesia Postprocedure Evaluation (Signed)
Anesthesia Post Note  Patient: Heidi Torres  Procedure(s) Performed: CYSTOSCOPY/RETROGRADE/URETEROSCOPY/ BIOPSY,BLADDER BIOPSY PLACEMENT STENT LEFT URETER (Left )     Patient location during evaluation: PACU Anesthesia Type: General Level of consciousness: awake and alert Pain management: pain level controlled Vital Signs Assessment: post-procedure vital signs reviewed and stable Respiratory status: spontaneous breathing, nonlabored ventilation, respiratory function stable and patient connected to nasal cannula oxygen Cardiovascular status: blood pressure returned to baseline and stable Postop Assessment: no apparent nausea or vomiting Anesthetic complications: no    Last Vitals:  Vitals:   01/20/17 0930 01/20/17 1000  BP: (!) 147/75 (!) 149/76  Pulse: 61   Resp:  20  Temp:  36.6 C  SpO2: 97% 98%    Last Pain:  Vitals:   01/20/17 0623  TempSrc:   PainSc: 0-No pain                 Kody Brandl DAVID

## 2017-01-20 NOTE — Anesthesia Preprocedure Evaluation (Signed)
Anesthesia Evaluation  Patient identified by MRN, date of birth, ID band Patient awake    Reviewed: Allergy & Precautions, NPO status , Patient's Chart, lab work & pertinent test results  Airway Mallampati: I  TM Distance: >3 FB Neck ROM: Full    Dental   Pulmonary    Pulmonary exam normal        Cardiovascular hypertension, Pt. on medications Normal cardiovascular exam     Neuro/Psych    GI/Hepatic GERD  Medicated and Controlled,  Endo/Other    Renal/GU      Musculoskeletal   Abdominal   Peds  Hematology   Anesthesia Other Findings   Reproductive/Obstetrics                             Anesthesia Physical Anesthesia Plan  ASA: II  Anesthesia Plan: General   Post-op Pain Management:    Induction: Intravenous  PONV Risk Score and Plan: 3 and Ondansetron, Dexamethasone and Treatment may vary due to age or medical condition  Airway Management Planned: LMA  Additional Equipment:   Intra-op Plan:   Post-operative Plan: Extubation in OR  Informed Consent: I have reviewed the patients History and Physical, chart, labs and discussed the procedure including the risks, benefits and alternatives for the proposed anesthesia with the patient or authorized representative who has indicated his/her understanding and acceptance.     Plan Discussed with: CRNA and Surgeon  Anesthesia Plan Comments:         Anesthesia Quick Evaluation

## 2017-01-20 NOTE — Interval H&P Note (Signed)
History and Physical Interval Note:  01/20/2017 7:30 AM  [name]  has presented today for surgery, with the diagnosis of RENAL NEOPLASM, UNCERTAIN BEHAVIOR  The various methods of treatment have been discussed with the patient and family. After consideration of risks, benefits and other options for treatment, the patient has consented to  Procedure(s): CYSTOSCOPY/RETROGRADE/URETEROSCOPY/ BIOPSY (Left) as a surgical intervention .  I drew her pictures the anatomy and discussed the difference between a renal parenchymal tumor and a urothelial tumor and some of the management options.  She asked about biopsy of the mass and again we discussed the difference between a biopsy of urothelial versus a parenchymal tumor.  We discussed we would not be able to biopsy a parenchymal tumor today, but we discussed the role of parenchymal tumor biopsy. We discussed she may need a staged procedure.  The patient's history has been reviewed, patient examined, no change in status, stable for surgery.  I have reviewed the patient's chart and labs.  Questions were answered to the patient's satisfaction.  She elects to proceed.   Festus Aloe

## 2017-01-20 NOTE — Discharge Instructions (Signed)
Ureteral Stent Implantation, Care After Refer to this sheet in the next few weeks. These instructions provide you with information about caring for yourself after your procedure. Your health care provider may also give you more specific instructions. Your treatment has been planned according to current medical practices, but problems sometimes occur. Call your health care provider if you have any problems or questions after your procedure.  Removal of the stent: Remove the stent by pulling the string on Tuesday morning, January 24, 2017.   What can I expect after the procedure? After the procedure, it is common to have:  Nausea.  Mild pain when you urinate. You may feel this pain in your lower back or lower abdomen. Pain should stop within a few minutes after you urinate. This may last for up to 1 week.  A small amount of blood in your urine for several days.  Follow these instructions at home:  Medicines  Take over-the-counter and prescription medicines only as told by your health care provider.  If you were prescribed an antibiotic medicine, take it as told by your health care provider. Do not stop taking the antibiotic even if you start to feel better.  Do not drive for 24 hours if you received a sedative.  Do not drive or operate heavy machinery while taking prescription pain medicines. Activity  Return to your normal activities as told by your health care provider. Ask your health care provider what activities are safe for you.  Do not lift anything that is heavier than 10 lb (4.5 kg). Follow this limit for 1 week after your procedure, or for as long as told by your health care provider. General instructions  Watch for any blood in your urine. Call your health care provider if the amount of blood in your urine increases.  If you have a catheter: ? Follow instructions from your health care provider about taking care of your catheter and collection bag. ? Do not take baths,  swim, or use a hot tub until your health care provider approves.  Drink enough fluid to keep your urine clear or pale yellow.  Keep all follow-up visits as told by your health care provider. This is important. Contact a health care provider if:  You have pain that gets worse or does not get better with medicine, especially pain when you urinate.  You have difficulty urinating.  You feel nauseous or you vomit repeatedly during a period of more than 2 days after the procedure. Get help right away if:  Your urine is dark red or has blood clots in it.  You are leaking urine (have incontinence).  The end of the stent comes out of your urethra.  You cannot urinate.  You have sudden, sharp, or severe pain in your abdomen or lower back.  You have a fever. This information is not intended to replace advice given to you by your health care provider. Make sure you discuss any questions you have with your health care provider. Document Released: 08/22/2012 Document Revised: 05/28/2015 Document Reviewed: 07/04/2014 Elsevier Interactive Patient Education  [employer].

## 2017-01-20 NOTE — Progress Notes (Signed)
Pt s/p Cystoscopy, left retrograde pyelogram, left diagnostic ureteroscopy, bladder biopsy and fulguration, left ureteral stent placement.   I did want to add prior to the procedure I drew pt a picture of the mri findings. We discussed again discussed the nature of renal mass - benign vs malignant and management might include biopsy, ablation, partial or radical Nx. We discussed a urothelial lesion (which she does not have) management might involve endoscopic management vs nephroureterectomy, and would have a different malignant potential and recurrence risk than a renal parenchymal mass, thus the importance of today's procedure.

## 2017-01-20 NOTE — Op Note (Signed)
Preoperative diagnosis: Left renal mass Postoperative diagnosis: Left renal mass, bladder polyp/neoplasm of uncertain behavior  Procedure: Cystoscopy, left retrograde pyelogram, left diagnostic ureteroscopy, bladder biopsy and fulguration, left ureteral stent placement  Surgeon: Junious Silk  Anesthesia: General  Indication for procedure: 62 year old with left medial incidental renal mass that abutted the left renal pelvis.  She was brought today for diagnostic ureteroscopy to ensure no urothelial lesion.  Findings: On exam under anesthesia of the vulva and vagina appeared normal.  The bladder and urethra are palpably normal.  The meatus appeared normal.  Cystoscopy revealed a smooth cyst on the left trigone, no other mucosal lesions.  Ureteral orifices were in the normal orthotopic position with clear reflux.  There was no stone or foreign body in the bladder.  The bladder neck and urethra appeared normal.  Left retrograde pyelogram-this outlined a single ureter single collecting system unit without filling defect, stricture or dilation.  Left ureteroscopy- this revealed a normal ureter and collecting system without urothelial lesion.  There was no mass-effect on the renal pelvis or proximal ureter.  Description of procedure: After consent was obtained the patient brought to the operating room.  After adequate anesthesia she was placed in lithotomy position and prepped and draped in the usual sterile fashion.  A timeout was performed to confirm the patient and procedure.  The cystoscope was passed per urethra and the bladder carefully inspected.  I could see the bladder neck well with a 30 degree lens.  Other than the cyst on the left trigone no other lesions were noted.  The left ureteral orifice was cannulated with a 6 [place] open-ended catheter and retrograde injection of contrast was performed.  I then used a no touch technique and passed the semirigid ureteroscope into the left ureteral orifice  and into the left proximal ureter all of which was noted to be normal.  A sensor wire was then advanced and I tried to keep it in the proximal ureter but it was pushed up into the kidney as I backed out the scope.  I then placed a Glidewire into the ureter.  The Glidewire was left as a safety wire and I tried to pass the digital ureteroscope, single-channel, over the sensor wire but it would not pass through the ureteral orifice.  Short access sheath was then advanced without difficulty over the sensor wire into the proximal ureter.  The digital ureteroscope was advanced into the proximal ureter which noted to be normal.  In the proximal ureter at the UPJ there was some slight submucosal hemorrhage medial and anteriorly from the wire and also up in the upper pole infundibula.  There were no urothelial lesions.  The collecting system posterior and along the proximal ureter, UPJ and collecting system where the mass was posterior to this all appeared normal.  The collecting system was carefully inspected and noted to be normal.  The ureteroscope was backed out and the ureter inspected and noted to be normal without injury.  I backed out the ureteroscope and the access sheath together.  I then turned back to the cystoscope and used a cold cup biopsy forceps to biopsy the left trigone cyst which was about 5 mm in width.  This appeared superficial in the biopsy site fulgurated.  Hemostasis was excellent at low pressure.  The wire was backloaded on the cystoscope and a 6 x 26 cm stent was advanced.  The wire was removed with a good coil seen in the kidney and a good coil in  the bladder.  I left a string on the stent.  I placed the stent after having to use the access sheath.  The bladder was drained and the scope removed.  She was awakened and taken to the recovery room in stable condition.  Complications: None  Blood loss: Minimal  Specimens to pathology: Left trigone biopsy  Drains: 6 x 26 cm left ureteral stent  with string  Disposition: Patient stable to PACU

## 2017-01-20 NOTE — Progress Notes (Signed)
Anesthesia d/c instructions given

## 2017-01-20 NOTE — Transfer of Care (Signed)
Immediate Anesthesia Transfer of Care Note  Patient: Heidi Torres  Procedure(s) Performed: CYSTOSCOPY/RETROGRADE/URETEROSCOPY/ BIOPSY,BLADDER BIOPSY  (Left )  Patient Location: PACU  Anesthesia Type:General  Level of Consciousness: drowsy and patient cooperative  Airway & Oxygen Therapy: Patient Spontanous Breathing and Patient connected to face mask oxygen  Post-op Assessment: Report given to RN and Post -op Vital signs reviewed and stable  Post vital signs: Reviewed and stable  Last Vitals:  Vitals:   01/20/17 0608  BP: 116/64  Pulse: (!) 59  Resp: 16  Temp: 36.9 C  SpO2: 99%    Last Pain:  Vitals:   01/20/17 0623  TempSrc:   PainSc: 0-No pain         Complications: No apparent anesthesia complications

## 2017-01-31 ENCOUNTER — Other Ambulatory Visit: Payer: Self-pay | Admitting: Urology

## 2017-02-17 ENCOUNTER — Encounter (HOSPITAL_COMMUNITY): Payer: Self-pay

## 2017-02-17 ENCOUNTER — Other Ambulatory Visit: Payer: Self-pay

## 2017-02-17 ENCOUNTER — Encounter (HOSPITAL_COMMUNITY)
Admission: RE | Admit: 2017-02-17 | Discharge: 2017-02-17 | Disposition: A | Discharge status: Home / Self Care | Payer: BC Managed Care – PPO | Source: Ambulatory Visit | Attending: Urology | Admitting: Urology

## 2017-02-17 DIAGNOSIS — Z01812 Encounter for preprocedural laboratory examination: Secondary | ICD-10-CM | POA: Diagnosis present

## 2017-02-17 DIAGNOSIS — I10 Essential (primary) hypertension: Secondary | ICD-10-CM | POA: Insufficient documentation

## 2017-02-17 DIAGNOSIS — Z01810 Encounter for preprocedural cardiovascular examination: Secondary | ICD-10-CM | POA: Diagnosis present

## 2017-02-17 DIAGNOSIS — N2889 Other specified disorders of kidney and ureter: Secondary | ICD-10-CM | POA: Diagnosis not present

## 2017-02-17 HISTORY — DX: Nausea with vomiting, unspecified: R11.2

## 2017-02-17 HISTORY — DX: Other specified postprocedural states: Z98.890

## 2017-02-17 LAB — BASIC METABOLIC PANEL
ANION GAP: 9 (ref 5–15)
BUN: 24 mg/dL — ABNORMAL HIGH (ref 6–20)
CALCIUM: 9.4 mg/dL (ref 8.9–10.3)
CO2: 26 mmol/L (ref 22–32)
Chloride: 105 mmol/L (ref 101–111)
Creatinine, Ser: 0.73 mg/dL (ref 0.44–1.00)
Glucose, Bld: 120 mg/dL — ABNORMAL HIGH (ref 65–99)
Potassium: 4.2 mmol/L (ref 3.5–5.1)
SODIUM: 140 mmol/L (ref 135–145)

## 2017-02-17 LAB — CBC
HEMATOCRIT: 38.1 % (ref 36.0–46.0)
HEMOGLOBIN: 12.5 g/dL (ref 12.0–15.0)
MCH: 28.3 pg (ref 26.0–34.0)
MCHC: 32.8 g/dL (ref 30.0–36.0)
MCV: 86.4 fL (ref 78.0–100.0)
Platelets: 360 10*3/uL (ref 150–400)
RBC: 4.41 MIL/uL (ref 3.87–5.11)
RDW: 12.8 % (ref 11.5–15.5)
WBC: 13 10*3/uL — AB (ref 4.0–10.5)

## 2017-02-17 NOTE — Progress Notes (Addendum)
11-29-16 (Epic) CXR  10-14-16 (Epic) EKG  11-02-16 (Chart Everywhere) ECHO

## 2017-02-17 NOTE — Progress Notes (Signed)
At 02-17-17 PAT appt, pt reported that she had the flu on 02-08-17. Antibiotic was prescribed. She will contact Dr. Zettie Pho office, as she is still on antibiotic.

## 2017-02-17 NOTE — Patient Instructions (Signed)
Heidi Torres  02/17/2017   Your procedure is scheduled on: 02-24-17   Report to [facility] Main  Entrance Report to Admitting at 9:00 AM   Call this number if you have problems the morning of surgery [phone]   Remember: Do not eat food or drink liquids :After Midnight.   Please have a Clear Liquid Diet along with your prep, the day before surgery     CLEAR LIQUID DIET   Foods Allowed                                                                     Foods Excluded  Coffee and tea, regular and decaf                             liquids that you cannot  Plain Jell-O in any flavor                                             see through such as: Fruit ices (not with fruit pulp)                                     milk, soups, orange juice  Iced Popsicles                                    All solid food Carbonated beverages, regular and diet                                    Cranberry, grape and apple juices Sports drinks like Gatorade Lightly seasoned clear broth or consume(fat free) Sugar, honey syrup  Sample Menu Breakfast                                Lunch                                     Supper Cranberry juice                    Beef broth                            Chicken broth Jell-O                                     Grape juice                           Apple juice Coffee or tea  Jell-O                                      Popsicle                                                Coffee or tea                        Coffee or tea  _____________________________________________________________________     Take these medicines the morning of surgery with A SIP OF WATER: Amlodipine (Norvasc), Dexlansoprazole (Dexilant), and Montelukast (Singular). You may also bring and use your inhaler as needed.                                You may not have any metal on your body including hair pins and              piercings   Do not wear jewelry, make-up, lotions, powders or perfumes, deodorant             Do not wear nail polish.  Do not shave  48 hours prior to surgery.           Do not bring valuables to the hospital. [place].  Contacts, dentures or bridgework may not be worn into surgery.  Leave suitcase in the car. After surgery it may be brought to your room.                 Please read over the following fact sheets you were given: _____________________________________________________________________             [facility] - Preparing for Surgery Before surgery, you can play an important role.  Because skin is not sterile, your skin needs to be as free of germs as possible.  You can reduce the number of germs on your skin by washing with CHG (chlorahexidine gluconate) soap before surgery.  CHG is an antiseptic cleaner which kills germs and bonds with the skin to continue killing germs even after washing. Please DO NOT use if you have an allergy to CHG or antibacterial soaps.  If your skin becomes reddened/irritated stop using the CHG and inform your nurse when you arrive at Short Stay. Do not shave (including legs and underarms) for at least 48 hours prior to the first CHG shower.  You may shave your face/neck. Please follow these instructions carefully:  1.  Shower with CHG Soap the night before surgery and the  morning of Surgery.  2.  If you choose to wash your hair, wash your hair first as usual with your  normal  shampoo.  3.  After you shampoo, rinse your hair and body thoroughly to remove the  shampoo.                           4.  Use CHG as you would any other liquid soap.  You can apply chg directly  to the skin and wash  Gently with a scrungie or clean washcloth.  5.  Apply the CHG Soap to your body ONLY FROM THE NECK DOWN.   Do not use on face/ open                           Wound or open sores. Avoid contact with eyes,  ears mouth and genitals (private parts).                       Wash face,  Genitals (private parts) with your normal soap.             6.  Wash thoroughly, paying special attention to the area where your surgery  will be performed.  7.  Thoroughly rinse your body with warm water from the neck down.  8.  DO NOT shower/wash with your normal soap after using and rinsing off  the CHG Soap.                9.  Pat yourself dry with a clean towel.            10.  Wear clean pajamas.            11.  Place clean sheets on your bed the night of your first shower and do not  sleep with pets. Day of Surgery : Do not apply any lotions/deodorants the morning of surgery.  Please wear clean clothes to the hospital/surgery center.  FAILURE TO FOLLOW THESE INSTRUCTIONS MAY RESULT IN THE CANCELLATION OF YOUR SURGERY PATIENT SIGNATURE_________________________________  NURSE SIGNATURE__________________________________  ________________________________________________________________________  WHAT IS A BLOOD TRANSFUSION? Blood Transfusion Information  A transfusion is the replacement of blood or some of its parts. Blood is made up of multiple cells which provide different functions.  Red blood cells carry oxygen and are used for blood loss replacement.  White blood cells fight against infection.  Platelets control bleeding.  Plasma helps clot blood.  Other blood products are available for specialized needs, such as hemophilia or other clotting disorders. BEFORE THE TRANSFUSION  Who gives blood for transfusions?   Healthy volunteers who are fully evaluated to make sure their blood is safe. This is blood bank blood. Transfusion therapy is the safest it has ever been in the practice of medicine. Before blood is taken from a donor, a complete history is taken to make sure that person has no history of diseases nor engages in risky social behavior (examples are intravenous drug use or sexual activity with  multiple partners). The donor's travel history is screened to minimize risk of transmitting infections, such as malaria. The donated blood is tested for signs of infectious diseases, such as HIV and hepatitis. The blood is then tested to be sure it is compatible with you in order to minimize the chance of a transfusion reaction. If you or a relative donates blood, this is often done in anticipation of surgery and is not appropriate for emergency situations. It takes many days to process the donated blood. RISKS AND COMPLICATIONS Although transfusion therapy is very safe and saves many lives, the main dangers of transfusion include:   Getting an infectious disease.  Developing a transfusion reaction. This is an allergic reaction to something in the blood you were given. Every precaution is taken to prevent this. The decision to have a blood transfusion has been considered carefully by your caregiver before blood is given. Blood is not given unless the benefits  outweigh the risks. AFTER THE TRANSFUSION  Right after receiving a blood transfusion, you will usually feel much better and more energetic. This is especially true if your red blood cells have gotten low (anemic). The transfusion raises the level of the red blood cells which carry oxygen, and this usually causes an energy increase.  The nurse administering the transfusion will monitor you carefully for complications. HOME CARE INSTRUCTIONS  No special instructions are needed after a transfusion. You may find your energy is better. Speak with your caregiver about any limitations on activity for underlying diseases you may have. SEEK MEDICAL CARE IF:   Your condition is not improving after your transfusion.  You develop redness or irritation at the intravenous (IV) site. SEEK IMMEDIATE MEDICAL CARE IF:  Any of the following symptoms occur over the next 12 hours:  Shaking chills.  You have a temperature by mouth above 102 F (38.9 C), not  controlled by medicine.  Chest, back, or muscle pain.  People around you feel you are not acting correctly or are confused.  Shortness of breath or difficulty breathing.  Dizziness and fainting.  You get a rash or develop hives.  You have a decrease in urine output.  Your urine turns a dark color or changes to pink, red, or brown. Any of the following symptoms occur over the next 10 days:  You have a temperature by mouth above 102 F (38.9 C), not controlled by medicine.  Shortness of breath.  Weakness after normal activity.  The white part of the eye turns yellow (jaundice).  You have a decrease in the amount of urine or are urinating less often.  Your urine turns a dark color or changes to pink, red, or brown. Document Released: 12/18/1999 Document Revised: 03/14/2011 Document Reviewed: 08/06/2007 [facility] Patient Information 2014 [place], [place].  _______________________________________________________________________

## 2017-02-18 LAB — ABO/RH: ABO/RH(D): A POS

## 2017-02-20 NOTE — Progress Notes (Signed)
02-18-16 EKG   01-10-17 Cardiac clearance received from Dr. Clayborn Bigness  pt  at mild risk on chart.

## 2017-02-23 ENCOUNTER — Ambulatory Visit: Payer: BC Managed Care – PPO | Admitting: Obstetrics & Gynecology

## 2017-02-24 ENCOUNTER — Other Ambulatory Visit: Payer: Self-pay | Admitting: Oncology

## 2017-02-24 ENCOUNTER — Inpatient Hospital Stay (HOSPITAL_COMMUNITY): Payer: BC Managed Care – PPO | Admitting: Anesthesiology

## 2017-02-24 ENCOUNTER — Encounter (HOSPITAL_COMMUNITY)
Admission: RE | Disposition: A | Discharge status: Home / Self Care | Payer: Self-pay | Source: Ambulatory Visit | Attending: Urology

## 2017-02-24 ENCOUNTER — Other Ambulatory Visit: Payer: Self-pay

## 2017-02-24 ENCOUNTER — Inpatient Hospital Stay (HOSPITAL_COMMUNITY)
Admission: RE | Admit: 2017-02-24 | Discharge: 2017-02-26 | DRG: 661 | Disposition: A | Discharge status: Home / Self Care | Payer: BC Managed Care – PPO | Source: Ambulatory Visit | Attending: Urology | Admitting: Urology

## 2017-02-24 ENCOUNTER — Encounter (HOSPITAL_COMMUNITY): Payer: Self-pay

## 2017-02-24 DIAGNOSIS — I10 Essential (primary) hypertension: Secondary | ICD-10-CM | POA: Diagnosis present

## 2017-02-24 DIAGNOSIS — Z881 Allergy status to other antibiotic agents status: Secondary | ICD-10-CM | POA: Diagnosis not present

## 2017-02-24 DIAGNOSIS — Z960 Presence of urogenital implants: Secondary | ICD-10-CM | POA: Diagnosis present

## 2017-02-24 DIAGNOSIS — K449 Diaphragmatic hernia without obstruction or gangrene: Secondary | ICD-10-CM | POA: Diagnosis present

## 2017-02-24 DIAGNOSIS — Z90710 Acquired absence of both cervix and uterus: Secondary | ICD-10-CM

## 2017-02-24 DIAGNOSIS — R011 Cardiac murmur, unspecified: Secondary | ICD-10-CM | POA: Diagnosis present

## 2017-02-24 DIAGNOSIS — Z8249 Family history of ischemic heart disease and other diseases of the circulatory system: Secondary | ICD-10-CM | POA: Diagnosis not present

## 2017-02-24 DIAGNOSIS — K219 Gastro-esophageal reflux disease without esophagitis: Secondary | ICD-10-CM | POA: Diagnosis present

## 2017-02-24 DIAGNOSIS — Z905 Acquired absence of kidney: Secondary | ICD-10-CM

## 2017-02-24 DIAGNOSIS — Z888 Allergy status to other drugs, medicaments and biological substances status: Secondary | ICD-10-CM | POA: Diagnosis not present

## 2017-02-24 DIAGNOSIS — C642 Malignant neoplasm of left kidney, except renal pelvis: Secondary | ICD-10-CM | POA: Diagnosis present

## 2017-02-24 DIAGNOSIS — N2889 Other specified disorders of kidney and ureter: Principal | ICD-10-CM | POA: Diagnosis present

## 2017-02-24 HISTORY — DX: Acquired absence of kidney: Z90.5

## 2017-02-24 HISTORY — PX: ROBOTIC ASSITED PARTIAL NEPHRECTOMY: SHX6087

## 2017-02-24 LAB — HEMOGLOBIN AND HEMATOCRIT, BLOOD
HCT: 36.9 % (ref 36.0–46.0)
HEMOGLOBIN: 11.6 g/dL — AB (ref 12.0–15.0)

## 2017-02-24 LAB — TYPE AND SCREEN
ABO/RH(D): A POS
ANTIBODY SCREEN: NEGATIVE

## 2017-02-24 SURGERY — NEPHRECTOMY, PARTIAL, ROBOT-ASSISTED
Anesthesia: General | Laterality: Left

## 2017-02-24 MED ORDER — ONDANSETRON HCL 4 MG/2ML IJ SOLN
INTRAMUSCULAR | Status: DC | PRN
  Administered 2017-02-24: 4 mg via INTRAVENOUS

## 2017-02-24 MED ORDER — DEXTROSE-NACL 5-0.45 % IV SOLN
INTRAVENOUS | Status: DC
  Administered 2017-02-24 – 2017-02-25 (×2): via INTRAVENOUS

## 2017-02-24 MED ORDER — PROPOFOL 10 MG/ML IV BOLUS
INTRAVENOUS | Status: AC
  Filled 2017-02-24: qty 20

## 2017-02-24 MED ORDER — SUCRALFATE 1 G PO TABS
1.0000 g | ORAL_TABLET | Freq: Every day | ORAL | Status: DC
  Administered 2017-02-25 – 2017-02-26 (×2): 1 g via ORAL
  Filled 2017-02-24 (×2): qty 1

## 2017-02-24 MED ORDER — HYDROMORPHONE HCL 1 MG/ML IJ SOLN
INTRAMUSCULAR | Status: AC
  Filled 2017-02-24: qty 2

## 2017-02-24 MED ORDER — ALPRAZOLAM 0.25 MG PO TABS: 0.2500 mg | ORAL_TABLET | Freq: Every evening | ORAL | Status: DC | PRN

## 2017-02-24 MED ORDER — OXYCODONE HCL 5 MG PO TABS
5.0000 mg | ORAL_TABLET | ORAL | Status: DC | PRN
  Administered 2017-02-24 – 2017-02-26 (×5): 5 mg via ORAL
  Filled 2017-02-24 (×5): qty 1

## 2017-02-24 MED ORDER — SUGAMMADEX SODIUM 200 MG/2ML IV SOLN
INTRAVENOUS | Status: AC
  Filled 2017-02-24: qty 2

## 2017-02-24 MED ORDER — LACTATED RINGERS IV SOLN: INTRAVENOUS | Status: DC

## 2017-02-24 MED ORDER — DEXAMETHASONE SODIUM PHOSPHATE 10 MG/ML IJ SOLN
INTRAMUSCULAR | Status: AC
  Filled 2017-02-24: qty 1

## 2017-02-24 MED ORDER — DIPHENHYDRAMINE HCL 50 MG/ML IJ SOLN: 12.5000 mg | Freq: Four times a day (QID) | INTRAMUSCULAR | Status: DC | PRN

## 2017-02-24 MED ORDER — PROPOFOL 10 MG/ML IV BOLUS
INTRAVENOUS | Status: DC | PRN
  Administered 2017-02-24: 120 mg via INTRAVENOUS

## 2017-02-24 MED ORDER — HYDROCODONE-ACETAMINOPHEN 5-325 MG PO TABS: 1.0000 | ORAL_TABLET | Freq: Four times a day (QID) | ORAL | 0 refills | Status: DC | PRN

## 2017-02-24 MED ORDER — MONTELUKAST SODIUM 10 MG PO TABS
10.0000 mg | ORAL_TABLET | Freq: Every day | ORAL | Status: DC
  Administered 2017-02-25: 10 mg via ORAL
  Filled 2017-02-24: qty 1

## 2017-02-24 MED ORDER — HYDROMORPHONE HCL 1 MG/ML IJ SOLN
0.5000 mg | INTRAMUSCULAR | Status: DC | PRN
  Administered 2017-02-24: 1 mg via INTRAVENOUS
  Administered 2017-02-24: 0.5 mg via INTRAVENOUS
  Administered 2017-02-25 (×3): 1 mg via INTRAVENOUS
  Filled 2017-02-24 (×6): qty 1

## 2017-02-24 MED ORDER — MIDAZOLAM HCL 5 MG/5ML IJ SOLN
INTRAMUSCULAR | Status: DC | PRN
  Administered 2017-02-24 (×2): 2 mg via INTRAVENOUS

## 2017-02-24 MED ORDER — ACETAMINOPHEN 500 MG PO TABS
1000.0000 mg | ORAL_TABLET | Freq: Four times a day (QID) | ORAL | Status: AC
  Administered 2017-02-24 – 2017-02-25 (×3): 1000 mg via ORAL
  Filled 2017-02-24 (×3): qty 2

## 2017-02-24 MED ORDER — STERILE WATER FOR IRRIGATION IR SOLN
Status: DC | PRN
  Administered 2017-02-24: 1000 mL

## 2017-02-24 MED ORDER — ONDANSETRON HCL 4 MG/2ML IJ SOLN
INTRAMUSCULAR | Status: AC
  Filled 2017-02-24: qty 2

## 2017-02-24 MED ORDER — PROMETHAZINE HCL 25 MG/ML IJ SOLN
6.2500 mg | INTRAMUSCULAR | Status: DC | PRN
  Administered 2017-02-24: 12.5 mg via INTRAVENOUS

## 2017-02-24 MED ORDER — ROCURONIUM BROMIDE 100 MG/10ML IV SOLN
INTRAVENOUS | Status: DC | PRN
  Administered 2017-02-24: 10 mg via INTRAVENOUS
  Administered 2017-02-24: 50 mg via INTRAVENOUS
  Administered 2017-02-24: 10 mg via INTRAVENOUS

## 2017-02-24 MED ORDER — DIPHENHYDRAMINE HCL 12.5 MG/5ML PO ELIX: 12.5000 mg | ORAL_SOLUTION | Freq: Four times a day (QID) | ORAL | Status: DC | PRN

## 2017-02-24 MED ORDER — ESTRADIOL 1 MG PO TABS
2.0000 mg | ORAL_TABLET | Freq: Every day | ORAL | Status: DC
  Filled 2017-02-24 (×3): qty 2

## 2017-02-24 MED ORDER — SUGAMMADEX SODIUM 200 MG/2ML IV SOLN
INTRAVENOUS | Status: DC | PRN
  Administered 2017-02-24: 200 mg via INTRAVENOUS

## 2017-02-24 MED ORDER — SCOPOLAMINE 1 MG/3DAYS TD PT72
MEDICATED_PATCH | TRANSDERMAL | Status: DC | PRN
  Administered 2017-02-24: 1 via TRANSDERMAL

## 2017-02-24 MED ORDER — BUPIVACAINE LIPOSOME 1.3 % IJ SUSP
20.0000 mL | Freq: Once | INTRAMUSCULAR | Status: AC
  Administered 2017-02-24: 20 mL
  Filled 2017-02-24: qty 20

## 2017-02-24 MED ORDER — CEFAZOLIN SODIUM-DEXTROSE 2-4 GM/100ML-% IV SOLN
2.0000 g | INTRAVENOUS | Status: AC
  Administered 2017-02-24: 2 g via INTRAVENOUS
  Filled 2017-02-24: qty 100

## 2017-02-24 MED ORDER — SCOPOLAMINE 1 MG/3DAYS TD PT72
MEDICATED_PATCH | TRANSDERMAL | Status: AC
  Filled 2017-02-24: qty 1

## 2017-02-24 MED ORDER — LIDOCAINE HCL (CARDIAC) 20 MG/ML IV SOLN
INTRAVENOUS | Status: DC | PRN
  Administered 2017-02-24: 60 mg via INTRAVENOUS

## 2017-02-24 MED ORDER — ONDANSETRON HCL 4 MG/2ML IJ SOLN
4.0000 mg | INTRAMUSCULAR | Status: DC | PRN
  Administered 2017-02-24 – 2017-02-25 (×2): 4 mg via INTRAVENOUS
  Filled 2017-02-24 (×2): qty 2

## 2017-02-24 MED ORDER — LACTATED RINGERS IV SOLN
INTRAVENOUS | Status: DC
  Administered 2017-02-24: 12:00:00 via INTRAVENOUS
  Administered 2017-02-24: 1000 mL via INTRAVENOUS

## 2017-02-24 MED ORDER — HYDROMORPHONE HCL 1 MG/ML IJ SOLN
0.2500 mg | INTRAMUSCULAR | Status: DC | PRN
  Administered 2017-02-24 (×3): 0.5 mg via INTRAVENOUS

## 2017-02-24 MED ORDER — ROCURONIUM BROMIDE 10 MG/ML (PF) SYRINGE
PREFILLED_SYRINGE | INTRAVENOUS | Status: AC
  Filled 2017-02-24: qty 5

## 2017-02-24 MED ORDER — ALBUTEROL SULFATE HFA 108 (90 BASE) MCG/ACT IN AERS: 2.0000 | INHALATION_SPRAY | Freq: Four times a day (QID) | RESPIRATORY_TRACT | Status: DC | PRN

## 2017-02-24 MED ORDER — MAGNESIUM CITRATE PO SOLN
1.0000 | Freq: Once | ORAL | Status: DC
  Filled 2017-02-24: qty 296

## 2017-02-24 MED ORDER — PANTOPRAZOLE SODIUM 40 MG PO TBEC
40.0000 mg | DELAYED_RELEASE_TABLET | Freq: Every day | ORAL | Status: DC
  Administered 2017-02-25: 40 mg via ORAL
  Filled 2017-02-24: qty 1

## 2017-02-24 MED ORDER — ALBUTEROL SULFATE (2.5 MG/3ML) 0.083% IN NEBU: 2.5000 mg | INHALATION_SOLUTION | Freq: Four times a day (QID) | RESPIRATORY_TRACT | Status: DC | PRN

## 2017-02-24 MED ORDER — SODIUM CHLORIDE 0.9 % IJ SOLN
INTRAMUSCULAR | Status: AC
  Filled 2017-02-24: qty 20

## 2017-02-24 MED ORDER — MOMETASONE FURO-FORMOTEROL FUM 100-5 MCG/ACT IN AERO
2.0000 | INHALATION_SPRAY | Freq: Two times a day (BID) | RESPIRATORY_TRACT | Status: DC
  Administered 2017-02-24 – 2017-02-26 (×4): 2 via RESPIRATORY_TRACT
  Filled 2017-02-24: qty 8.8

## 2017-02-24 MED ORDER — AMLODIPINE BESYLATE 5 MG PO TABS
5.0000 mg | ORAL_TABLET | Freq: Every day | ORAL | Status: DC
  Administered 2017-02-25: 5 mg via ORAL
  Filled 2017-02-24: qty 1

## 2017-02-24 MED ORDER — DEXAMETHASONE SODIUM PHOSPHATE 10 MG/ML IJ SOLN
INTRAMUSCULAR | Status: DC | PRN
  Administered 2017-02-24: 10 mg via INTRAVENOUS

## 2017-02-24 MED ORDER — LACTATED RINGERS IR SOLN
Status: DC | PRN
  Administered 2017-02-24: 1000 mL

## 2017-02-24 MED ORDER — LIDOCAINE 2% (20 MG/ML) 5 ML SYRINGE
INTRAMUSCULAR | Status: AC
  Filled 2017-02-24: qty 5

## 2017-02-24 MED ORDER — MEPERIDINE HCL 50 MG/ML IJ SOLN: 6.2500 mg | INTRAMUSCULAR | Status: DC | PRN

## 2017-02-24 MED ORDER — FENTANYL CITRATE (PF) 250 MCG/5ML IJ SOLN
INTRAMUSCULAR | Status: AC
  Filled 2017-02-24: qty 5

## 2017-02-24 MED ORDER — ATORVASTATIN CALCIUM 20 MG PO TABS
20.0000 mg | ORAL_TABLET | Freq: Every day | ORAL | Status: DC
  Administered 2017-02-25: 20 mg via ORAL
  Filled 2017-02-24 (×2): qty 1

## 2017-02-24 MED ORDER — PROMETHAZINE HCL 25 MG/ML IJ SOLN
INTRAMUSCULAR | Status: AC
  Filled 2017-02-24: qty 1

## 2017-02-24 MED ORDER — FENTANYL CITRATE (PF) 100 MCG/2ML IJ SOLN
INTRAMUSCULAR | Status: DC | PRN
  Administered 2017-02-24 (×2): 50 ug via INTRAVENOUS
  Administered 2017-02-24: 100 ug via INTRAVENOUS
  Administered 2017-02-24: 50 ug via INTRAVENOUS

## 2017-02-24 MED ORDER — MIDAZOLAM HCL 2 MG/2ML IJ SOLN
INTRAMUSCULAR | Status: AC
  Filled 2017-02-24: qty 2

## 2017-02-24 SURGICAL SUPPLY — 74 items
ADH SKN CLS APL DERMABOND .7 (GAUZE/BANDAGES/DRESSINGS) ×1
AGENT HMST KT MTR STRL THRMB (HEMOSTASIS)
APL ESCP 34 STRL LF DISP (HEMOSTASIS)
APPLICATOR SURGIFLO ENDO (HEMOSTASIS) ×1 IMPLANT
BAG SPEC RTRVL LRG 6X4 10 (ENDOMECHANICALS) ×1
CHLORAPREP W/TINT 26ML (MISCELLANEOUS) ×2 IMPLANT
CLIP SUT LAPRA TY ABSORB (SUTURE) ×1 IMPLANT
CLIP VESOLOCK LG 6/CT PURPLE (CLIP) ×2 IMPLANT
CLIP VESOLOCK MED LG 6/CT (CLIP) ×3 IMPLANT
CLIP VESOLOCK XL 6/CT (CLIP) ×1 IMPLANT
COVER SURGICAL LIGHT HANDLE (MISCELLANEOUS) ×2 IMPLANT
COVER TIP SHEARS 8 DVNC (MISCELLANEOUS) ×1 IMPLANT
COVER TIP SHEARS 8MM DA VINCI (MISCELLANEOUS) ×1
CUTTER ECHEON FLEX ENDO 45 340 (ENDOMECHANICALS) ×1 IMPLANT
DECANTER SPIKE VIAL GLASS SM (MISCELLANEOUS) ×2 IMPLANT
DERMABOND ADVANCED (GAUZE/BANDAGES/DRESSINGS) ×1
DERMABOND ADVANCED .7 DNX12 (GAUZE/BANDAGES/DRESSINGS) ×1 IMPLANT
DRAIN CHANNEL 15F RND FF 3/16 (WOUND CARE) ×1 IMPLANT
DRAPE ARM DVNC X/XI (DISPOSABLE) ×4 IMPLANT
DRAPE COLUMN DVNC XI (DISPOSABLE) ×1 IMPLANT
DRAPE DA VINCI XI ARM (DISPOSABLE) ×4
DRAPE DA VINCI XI COLUMN (DISPOSABLE) ×1
DRAPE INCISE IOBAN 66X45 STRL (DRAPES) ×2 IMPLANT
DRAPE SHEET LG 3/4 BI-LAMINATE (DRAPES) ×2 IMPLANT
ELECT PENCIL ROCKER SW 15FT (MISCELLANEOUS) ×2 IMPLANT
ELECT REM PT RETURN 15FT ADLT (MISCELLANEOUS) ×2 IMPLANT
EVACUATOR SILICONE 100CC (DRAIN) ×1 IMPLANT
GLOVE BIO SURGEON STRL SZ 6.5 (GLOVE) ×2 IMPLANT
GLOVE BIOGEL M STRL SZ7.5 (GLOVE) ×4 IMPLANT
GOWN STRL REUS W/TWL LRG LVL3 (GOWN DISPOSABLE) ×4 IMPLANT
HEMOSTAT SURGICEL 4X8 (HEMOSTASIS) ×2 IMPLANT
IRRIG SUCT STRYKERFLOW 2 WTIP (MISCELLANEOUS)
IRRIGATION SUCT STRKRFLW 2 WTP (MISCELLANEOUS) IMPLANT
KIT BASIN OR (CUSTOM PROCEDURE TRAY) ×2 IMPLANT
LOOP VESSEL MAXI BLUE (MISCELLANEOUS) ×2 IMPLANT
MARKER SKIN DUAL TIP RULER LAB (MISCELLANEOUS) ×2 IMPLANT
NDL INSUFFLATION 14GA 120MM (NEEDLE) ×1 IMPLANT
NEEDLE INSUFFLATION 14GA 120MM (NEEDLE) ×2 IMPLANT
NS IRRIG 1000ML POUR BTL (IV SOLUTION) ×1 IMPLANT
PORT ACCESS TROCAR AIRSEAL 12 (TROCAR) ×1 IMPLANT
PORT ACCESS TROCAR AIRSEAL 5M (TROCAR) ×1
POSITIONER SURGICAL ARM (MISCELLANEOUS) ×4 IMPLANT
POUCH SPECIMEN RETRIEVAL 10MM (ENDOMECHANICALS) ×2 IMPLANT
RELOAD STAPLE 45 2.6 WHT THIN (STAPLE) IMPLANT
RELOAD STAPLE 60 2.6 WHT THN (STAPLE) IMPLANT
RELOAD STAPLER WHITE 60MM (STAPLE) IMPLANT
SEAL CANN UNIV 5-8 DVNC XI (MISCELLANEOUS) ×4 IMPLANT
SEAL XI 5MM-8MM UNIVERSAL (MISCELLANEOUS) ×4
SET TRI-LUMEN FLTR TB AIRSEAL (TUBING) ×2 IMPLANT
SOLUTION ELECTROLUBE (MISCELLANEOUS) ×2 IMPLANT
SPONGE LAP 4X18 X RAY DECT (DISPOSABLE) ×2 IMPLANT
STAPLE ECHEON FLEX 60 POW ENDO (STAPLE) IMPLANT
STAPLE RELOAD 45 WHT (STAPLE) ×4 IMPLANT
STAPLE RELOAD 45MM WHITE (STAPLE) ×8
STAPLER RELOAD WHITE 60MM (STAPLE)
SURGIFLO W/THROMBIN 8M KIT (HEMOSTASIS) ×1 IMPLANT
SUT ETHILON 3 0 PS 1 (SUTURE) ×1 IMPLANT
SUT MNCRL AB 4-0 PS2 18 (SUTURE) ×4 IMPLANT
SUT PDS AB 1 CT1 27 (SUTURE) ×6 IMPLANT
SUT V-LOC BARB 180 2/0GR6 GS22 (SUTURE)
SUT VIC AB 0 CT1 27 (SUTURE) ×2
SUT VIC AB 0 CT1 27XBRD ANTBC (SUTURE) ×4 IMPLANT
SUT VIC AB 2-0 SH 27 (SUTURE) ×4
SUT VIC AB 2-0 SH 27X BRD (SUTURE) ×2 IMPLANT
SUT VLOC BARB 180 ABS3/0GR12 (SUTURE)
SUTURE V-LC BRB 180 2/0GR6GS22 (SUTURE) IMPLANT
SUTURE VLOC BRB 180 ABS3/0GR12 (SUTURE) ×1 IMPLANT
TOWEL OR 17X26 10 PK STRL BLUE (TOWEL DISPOSABLE) ×3 IMPLANT
TOWEL OR NON WOVEN STRL DISP B (DISPOSABLE) ×2 IMPLANT
TRAY FOLEY W/METER SILVER 16FR (SET/KITS/TRAYS/PACK) ×2 IMPLANT
TRAY LAPAROSCOPIC (CUSTOM PROCEDURE TRAY) ×2 IMPLANT
TROCAR BLADELESS OPT 5 100 (ENDOMECHANICALS) IMPLANT
TROCAR XCEL 12X100 BLDLESS (ENDOMECHANICALS) ×2 IMPLANT
WATER STERILE IRR 1000ML POUR (IV SOLUTION) ×2 IMPLANT

## 2017-02-24 NOTE — Anesthesia Procedure Notes (Signed)
Procedure Name: Intubation Date/Time: 02/24/2017 11:15 AM Performed by: Glory Buff, CRNA Pre-anesthesia Checklist: Patient identified, Emergency Drugs available, Suction available and Patient being monitored Patient Re-evaluated:Patient Re-evaluated prior to induction Oxygen Delivery Method: Circle system utilized Preoxygenation: Pre-oxygenation with 100% oxygen Induction Type: IV induction Ventilation: Mask ventilation without difficulty Laryngoscope Size: Miller and 3 Grade View: Grade I Tube type: Oral Tube size: 7.0 mm Number of attempts: 1 Airway Equipment and Method: Stylet and Oral airway Placement Confirmation: ETT inserted through vocal cords under direct vision,  positive ETCO2 and breath sounds checked- equal and bilateral Secured at: 21 cm Tube secured with: Tape Dental Injury: Teeth and Oropharynx as per pre-operative assessment

## 2017-02-24 NOTE — Discharge Instructions (Signed)

## 2017-02-24 NOTE — Transfer of Care (Signed)
Immediate Anesthesia Transfer of Care Note  Patient: Heidi Torres  Procedure(s) Performed: XI ROBOTIC ASSITED LEFT  RADICAL NEPHRECTOMY (Left )  Patient Location: PACU  Anesthesia Type:General  Level of Consciousness: awake, alert  and oriented  Airway & Oxygen Therapy: Patient Spontanous Breathing and Patient connected to face mask oxygen  Post-op Assessment: Report given to RN and Post -op Vital signs reviewed and stable  Post vital signs: Reviewed and stable  Last Vitals:  Vitals:   02/24/17 0934  BP: 133/81  Pulse: 66  Resp: 16  Temp: 37.1 C  SpO2: 100%    Last Pain:  Vitals:   02/24/17 0955  TempSrc:   PainSc: 1       Patients Stated Pain Goal: 4 (52/17/47 1595)  Complications: No apparent anesthesia complications

## 2017-02-24 NOTE — Anesthesia Postprocedure Evaluation (Signed)
Anesthesia Post Note  Patient: Alexyss Balzarini  Procedure(s) Performed: XI ROBOTIC ASSITED LEFT  RADICAL NEPHRECTOMY (Left )     Patient location during evaluation: PACU Anesthesia Type: General Level of consciousness: awake and alert Pain management: pain level controlled Vital Signs Assessment: post-procedure vital signs reviewed and stable Respiratory status: spontaneous breathing, nonlabored ventilation, respiratory function stable and patient connected to nasal cannula oxygen Cardiovascular status: blood pressure returned to baseline and stable Postop Assessment: no apparent nausea or vomiting Anesthetic complications: no    Last Vitals:  Vitals:   02/24/17 1400 02/24/17 1415  BP: (!) 144/76 130/70  Pulse: (!) 58 61  Resp: 14 (!) 9  Temp:    SpO2: 100% 97%    Last Pain:  Vitals:   02/24/17 1415  TempSrc:   PainSc: Asleep                 Effie Berkshire

## 2017-02-24 NOTE — Brief Op Note (Signed)
02/24/2017  1:22 PM  PATIENT:  Heidi Torres  62 y.o. female  PRE-OPERATIVE DIAGNOSIS:  LEFT RENAL MASS  POST-OPERATIVE DIAGNOSIS:  LEFT RENAL MASS  PROCEDURE:  Procedure(s): XI ROBOTIC ASSITED LEFT  RADICAL NEPHRECTOMY (Left)  SURGEON:  Surgeon(s) and Role:    Alexis Frock, MD - Primary  PHYSICIAN ASSISTANT:   ASSISTANTS:Amanda Dancey PA   ANESTHESIA:   local and general  EBL:  50 mL   BLOOD ADMINISTERED:none  DRAINS: foley to gravity   LOCAL MEDICATIONS USED:  MARCAINE     SPECIMEN:  Source of Specimen:  left kidney with posterior hilar mass, stitch in suspect mass area  DISPOSITION OF SPECIMEN:  PATHOLOGY  COUNTS:  YES  TOURNIQUET:  * No tourniquets in log *  DICTATION: .Other Dictation: Dictation Number [id number]  PLAN OF CARE: Admit to inpatient   PATIENT DISPOSITION:  PACU - hemodynamically stable.   Delay start of Pharmacological VTE agent (>24hrs) due to surgical blood loss or risk of bleeding: yes

## 2017-02-24 NOTE — H&P (Signed)
Heidi Torres is an 62 y.o. female.    Chief Complaint:  Pre-op LEFT Partial v. Radical Nephrectomy  HPI:   1 - Endophytic Left Renal Mass - 1.8cm left posterior hilar enhancing mass incidetnal on CT during pneumonia admisssion 12/2016. Mass is solitary, posterior hilar and abuts collective system. 1 artery (early branching) / 1 vein left renovascular anatomy. Diagnostic ureteroscopy 01/2017 by Junious Silk ruled out overt transitional cell carcinoma.   PMH sig for mild HTN, BTL, ENT surgery. NO ischemic CV disease / blood thinners. Her PCP is [name] MD.    Today "Heidi Torres" is seen to proceed with LEFT robotic partial v. Radical nephrectomy.    Past Medical History:  Diagnosis Date  . Asthma   . Complication of anesthesia   . GERD (gastroesophageal reflux disease)   . Headache    occular HA  . Heart murmur   . History of hiatal hernia   . Hypertension   . Irregular heart beat    pac's and pvc's  . Pneumonia    11-2016  . PONV (postoperative nausea and vomiting)     Past Surgical History:  Procedure Laterality Date  . ABDOMINAL HYSTERECTOMY    . ABLATION     prior to hysterectomy  . CORONARY ANGIOPLASTY    . CYSTOSCOPY/RETROGRADE/URETEROSCOPY     01-20-17 Dr. Junious Silk  . CYSTOSCOPY/RETROGRADE/URETEROSCOPY Left 01/20/2017   Procedure: CYSTOSCOPY/RETROGRADE/URETEROSCOPY/ BIOPSY,BLADDER BIOPSY PLACEMENT STENT LEFT URETER;  Surgeon: Festus Aloe, MD;  Location: [facility];  Service: Urology;  Laterality: Left;  . DILATION AND CURETTAGE OF UTERUS    . NASAL ENDOSCOPY WITH EPISTAXIS CONTROL    . TUBAL LIGATION      Family History  Problem Relation Age of Onset  . Hypertension Brother    Social History:  reports that  has never smoked. she has never used smokeless tobacco. She reports that she does not drink alcohol or use drugs.  Allergies:  Allergies  Allergen Reactions  . Ciprofloxacin Other (See Comments)    Arms were numb  . Steri-Strip Compound Benzoin  [Benzoin Compound] Other (See Comments)    Blisters    No medications prior to admission.    No results found for this or any previous visit (from the past 48 hour(s)). No results found.  Review of Systems  Constitutional: Negative.  Negative for chills and fever.  HENT: Negative.   Eyes: Negative.   Respiratory: Negative.   Cardiovascular: Negative.   Gastrointestinal: Negative.   Genitourinary: Negative.   Musculoskeletal: Negative.   Skin: Negative.   Neurological: Negative.   Endo/Heme/Allergies: Negative.   Psychiatric/Behavioral: Negative.     There were no vitals taken for this visit. Physical Exam  Constitutional: She appears well-developed.  HENT:  Head: Normocephalic.  Eyes: Pupils are equal, round, and reactive to light.  Neck: Normal range of motion.  Cardiovascular: Normal rate.  Respiratory: Effort normal.  GI: Soft.  Genitourinary:  Genitourinary Comments: NO CVAT  Musculoskeletal: Normal range of motion.  Neurological: She is alert.  Skin: Skin is warm.  Psychiatric: She has a normal mood and affect.     Assessment/Plan  1 - Endophytic Left Renal Mass -  Proceed as planned with LEFT partial v. Radical nephrectomy. Given very endophytic / hilar location of tumor partial may not be technically feasible but will be assessed intra-operatively. Risks, benefits, alternatives, expected peri-op course discussed previously and reiterated today.    Alexis Frock, MD 02/24/2017, 7:23 AM

## 2017-02-24 NOTE — Anesthesia Preprocedure Evaluation (Addendum)
Anesthesia Evaluation  Patient identified by MRN, date of birth, ID band Patient awake    Reviewed: Allergy & Precautions, NPO status , Patient's Chart, lab work & pertinent test results  History of Anesthesia Complications (+) PONV and history of anesthetic complications  Airway Mallampati: I  TM Distance: >3 FB Neck ROM: Full    Dental  (+) Teeth Intact, Dental Advisory Given   Pulmonary asthma ,    breath sounds clear to auscultation       Cardiovascular hypertension, Pt. on medications + Valvular Problems/Murmurs  Rhythm:Regular Rate:Normal     Neuro/Psych  Headaches,    GI/Hepatic Neg liver ROS, hiatal hernia, GERD  ,  Endo/Other  negative endocrine ROS  Renal/GU negative Renal ROS     Musculoskeletal negative musculoskeletal ROS (+)   Abdominal Normal abdominal exam  (+)   Peds  Hematology negative hematology ROS (+)   Anesthesia Other Findings   Reproductive/Obstetrics                            Lab Results  Component Value Date   WBC 13.0 (H) 02/17/2017   HGB 12.5 02/17/2017   HCT 38.1 02/17/2017   MCV 86.4 02/17/2017   PLT 360 02/17/2017   Lab Results  Component Value Date   CREATININE 0.73 02/17/2017   BUN 24 (H) 02/17/2017   NA 140 02/17/2017   K 4.2 02/17/2017   CL 105 02/17/2017   CO2 26 02/17/2017   No results found for: INR, PROTIME  EKG: normal sinus rhythm.   Anesthesia Physical Anesthesia Plan  ASA: II  Anesthesia Plan: General   Post-op Pain Management:    Induction: Intravenous  PONV Risk Score and Plan: 4 or greater and Ondansetron, Dexamethasone, Midazolam and Scopolamine patch - Pre-op  Airway Management Planned: Oral ETT  Additional Equipment: None  Intra-op Plan:   Post-operative Plan: Extubation in OR  Informed Consent: I have reviewed the patients History and Physical, chart, labs and discussed the procedure including the risks,  benefits and alternatives for the proposed anesthesia with the patient or authorized representative who has indicated his/her understanding and acceptance.   Dental advisory given  Plan Discussed with: CRNA  Anesthesia Plan Comments:        Anesthesia Quick Evaluation

## 2017-02-25 LAB — BASIC METABOLIC PANEL
ANION GAP: 10 (ref 5–15)
BUN: 13 mg/dL (ref 6–20)
CALCIUM: 8.6 mg/dL — AB (ref 8.9–10.3)
CO2: 24 mmol/L (ref 22–32)
CREATININE: 1.1 mg/dL — AB (ref 0.44–1.00)
Chloride: 104 mmol/L (ref 101–111)
GFR, EST NON AFRICAN AMERICAN: 53 mL/min — AB (ref >60)
Glucose, Bld: 156 mg/dL — ABNORMAL HIGH (ref 65–99)
Potassium: 4.2 mmol/L (ref 3.5–5.1)
Sodium: 138 mmol/L (ref 135–145)

## 2017-02-25 LAB — HEMOGLOBIN AND HEMATOCRIT, BLOOD
HEMATOCRIT: 36.3 % (ref 36.0–46.0)
Hemoglobin: 11.6 g/dL — ABNORMAL LOW (ref 12.0–15.0)

## 2017-02-25 MED ORDER — DIPHENHYDRAMINE-ZINC ACETATE 2-0.1 % EX CREA
TOPICAL_CREAM | Freq: Two times a day (BID) | CUTANEOUS | Status: DC | PRN
  Filled 2017-02-25: qty 28

## 2017-02-25 NOTE — Progress Notes (Signed)
Doing well. Did vomit post op but that has resolved. No current n/v/f/c +ambulating Still requiring IV pain medication  Vitals:   02/25/17 0232 02/25/17 0550 02/25/17 0839 02/25/17 0955  BP: 124/65 121/74  132/61  Pulse: (!) 54 (!) 52  62  Resp: 18 18  14   Temp: 98.2 F (36.8 C) 97.8 F (36.6 C)  98.6 F (37 C)  TempSrc: Oral Oral  Oral  SpO2: 99% 100% 98% 99%  Weight:      Height:       I/O last 3 completed shifts: In: 3048.8 [P.O.:240; I.V.:2708.8; IV Piggyback:100] Out: 6606 [Urine:3715; Blood:50] Total I/O In: 25 [P.O.:560] Out: 1750 [Urine:1750]   NAD Soft approp T. Mild D. Inc c/d/i with mild echymosis Foley clear yellow  CBC    Component Value Date/Time   WBC 13.0 (H) 02/17/2017 1450   RBC 4.41 02/17/2017 1450   HGB 11.6 (L) 02/25/2017 0437   HCT 36.3 02/25/2017 0437   PLT 360 02/17/2017 1450   MCV 86.4 02/17/2017 1450   MCH 28.3 02/17/2017 1450   MCHC 32.8 02/17/2017 1450   RDW 12.8 02/17/2017 1450   BMP Latest Ref Rng & Units 02/25/2017 02/17/2017 01/18/2017  Glucose 65 - 99 mg/dL 156(H) 120(H) 99  BUN 6 - 20 mg/dL 13 24(H) 22(H)  Creatinine 0.44 - 1.00 mg/dL 1.10(H) 0.73 0.63  Sodium 135 - 145 mmol/L 138 140 137  Potassium 3.5 - 5.1 mmol/L 4.2 4.2 3.3(L)  Chloride 101 - 111 mmol/L 104 105 102  CO2 22 - 32 mmol/L 24 26 28   Calcium 8.9 - 10.3 mg/dL 8.6(L) 9.4 9.1   POD 1 L robo Nx. Doing well. However still requiring IV pain meds -wean IV pain meds. Will likely need one more day in hospital to establish pain control -advance diet -d/c foley -OOB/ambulate/IS -likely home tomorrow AM

## 2017-02-25 NOTE — Progress Notes (Signed)
Patient has new rash to top of right hand. Rash is red, non-raised, almost appears as petechia. Does not itch or hurt. Rash starts at knuckles and spreads up to wrist. New since this AM. MD Pilar Jarvis made aware- topical Benadryl ordered PRN. Will continue to monitor.

## 2017-02-26 NOTE — Discharge Summary (Signed)
Date of admission: 02/24/2017  Date of discharge: 02/26/2017  Admission diagnosis: Left renal mass  Discharge diagnosis: Same  Secondary diagnoses:  Patient Active Problem List   Diagnosis Date Noted  . Renal mass 02/24/2017    History and Physical: For full details, please see admission history and physical. Briefly, Heidi Torres is a 62 y.o. year old patient with left renal mass s/p robotic left nephrectomy.   Hospital Course: Patient tolerated the procedure well.  She was then transferred to the floor after an uneventful PACU stay.  Her hospital course was uncomplicated.  On POD# 2 she had met discharge criteria: was eating a regular diet, was up and ambulating independently,  pain was well controlled, was voiding without a catheter, and was ready to for discharge.  PE: NAD Soft approp T mild D. Inc c/d/i with mild midline ecchymosis No foley   Laboratory values:  Recent Labs    02/24/17 1349 02/25/17 0437  HGB 11.6* 11.6*  HCT 36.9 36.3   Recent Labs    02/25/17 0437  NA 138  K 4.2  CL 104  CO2 24  GLUCOSE 156*  BUN 13  CREATININE 1.10*  CALCIUM 8.6*   No results for input(s): LABPT, INR in the last 72 hours. No results for input(s): LABURIN in the last 72 hours. Results for orders placed or performed during the hospital encounter of 10/14/16  Urine culture     Status: Abnormal   Collection Time: 10/14/16 10:01 AM  Result Value Ref Range Status   Specimen Description URINE, RANDOM  Final   Special Requests NONE  Final   Culture >=100,000 COLONIES/mL ESCHERICHIA COLI (A)  Final   Report Status 10/17/2016 FINAL  Final   Organism ID, Bacteria ESCHERICHIA COLI (A)  Final      Susceptibility   Escherichia coli - MIC*    AMPICILLIN >=32 RESISTANT Resistant     CEFAZOLIN <=4 SENSITIVE Sensitive     CEFTRIAXONE <=1 SENSITIVE Sensitive     CIPROFLOXACIN <=0.25 SENSITIVE Sensitive     GENTAMICIN <=1 SENSITIVE Sensitive     IMIPENEM <=0.25 SENSITIVE Sensitive      NITROFURANTOIN 64 INTERMEDIATE Intermediate     TRIMETH/SULFA >=320 RESISTANT Resistant     AMPICILLIN/SULBACTAM >=32 RESISTANT Resistant     PIP/TAZO <=4 SENSITIVE Sensitive     Extended ESBL NEGATIVE Sensitive     * >=100,000 COLONIES/mL ESCHERICHIA COLI    Disposition: Home  Discharge instruction: The patient was instructed to be ambulatory but told to refrain from heavy lifting, strenuous activity, or driving.   Discharge medications:  Allergies as of 02/26/2017      Reactions   Ciprofloxacin Other (See Comments)   Arms were numb   Steri-strip Compound Benzoin [benzoin Compound] Other (See Comments)   Blisters      Medication List    STOP taking these medications   aspirin EC 81 MG tablet   Vitamin D3 2000 units Tabs     TAKE these medications   ADVAIR DISKUS 100-50 MCG/DOSE Aepb Generic drug:  Fluticasone-Salmeterol Take 2 puffs by mouth See admin instructions. 2 puffs scheduled once in the morning, may repeat dose in the evening if wheezing.   ALPRAZolam 0.25 MG tablet Commonly known as:  XANAX Take 0.25 mg by mouth at bedtime as needed for anxiety. Anxiety.   amLODipine 5 MG tablet Commonly known as:  NORVASC Take 5 mg by mouth daily.   atorvastatin 20 MG tablet Commonly known as:  LIPITOR Take 20  mg by mouth at bedtime.   DEXILANT 60 MG capsule Generic drug:  dexlansoprazole Take 60 mg by mouth daily.   doxycycline 100 MG capsule Commonly known as:  VIBRAMYCIN Take 100 mg by mouth 2 (two) times daily.   estradiol 2 MG tablet Commonly known as:  ESTRACE Take 2 mg by mouth daily.   HYDROcodone-acetaminophen 5-325 MG tablet Commonly known as:  NORCO Take 1-2 tablets by mouth every 6 (six) hours as needed for moderate pain or severe pain.   lisinopril 10 MG tablet Commonly known as:  PRINIVIL,ZESTRIL Take 10 mg by mouth daily.   Magnesium 250 MG Tabs Take 250 mg by mouth daily.   montelukast 10 MG tablet Commonly known as:  SINGULAIR Take  10 mg by mouth daily.   potassium chloride 10 MEQ CR capsule Commonly known as:  MICRO-K Take 20 mEq by mouth daily.   predniSONE 20 MG tablet Commonly known as:  DELTASONE Take 20 mg by mouth daily with breakfast.   PROAIR HFA 108 (90 Base) MCG/ACT inhaler Generic drug:  albuterol Inhale 2 puffs into the lungs every 6 (six) hours as needed. For shortness of breath and wheezing.   sucralfate 1 g tablet Commonly known as:  CARAFATE Take 1 g by mouth daily.   triamterene-hydrochlorothiazide 37.5-25 MG capsule Commonly known as:  DYAZIDE Take 1 capsule by mouth daily.       Followup:  Follow-up Information    Alexis Frock, MD On 03/13/2017.   Specialty:  Urology Why:  at 11:30 AM. Dr. [name] will call you wtih pathology results when available.  Contact information: [place] [address] [phone]

## 2017-02-26 NOTE — Progress Notes (Signed)
Discharge paperwork explained to patient who verbalized understanding. Printed prescription given to patient also. Pain well controlled on PO meds. Pt up ambulating in hallway independently. Voiding with no complications. Incisions well approximated with no drainage. Pt awaiting husband's arrival for transportation. Will assist to exit via W/C.

## 2017-02-27 NOTE — Op Note (Signed)
NAME:  Heidi Torres, ITALIANO NO.:  MEDICAL RECORD NO.:  [id number]  LOCATION:                                 FACILITY:  PHYSICIAN:  Alexis Frock, MD     DATE OF BIRTH:  [birth year]  DATE OF PROCEDURE: 02/24/2017                               OPERATIVE REPORT   PREOPERATIVE DIAGNOSIS:  Left endophytic renal mass.  POSTOPERATIVE DIAGNOSIS:  Left endophytic renal mass.  PROCEDURE: 1. Left robotic radical nephrectomy. 2. Intraoperative ultrasound with interpretation.  ESTIMATED BLOOD LOSS:  Less than 100 cc.  COMPLICATION:  None.  SPECIMEN:  Left kidney with posterior hilar mass, stitch in mass area for permanent pathology.  ASSISTANT:  Debbrah Alar, PA.  FINDINGS: 1. Single artery, single vein, left renovascular anatomy as     anticipated. 2. Nearly 100% endophytic posterior hilar mass, this corroborated     intraoperative ultrasound, this is not amenable to nephron sparing.  INDICATION:  Ms. Delcid is a very pleasant 62 year old woman who was found incidentally on abdominal imaging to have an endophytic left hilar renal mass.  Given the hilar nature, she underwent diagnostic ureteroscopy last month by one of my colleagues, which revealed no intraluminal lesions and ruled out primary urothelial carcinoma.  This is most consistent with a cystic renal cell in the area of the hilum. Options were discussed for management including surveillance protocol versus surgical extirpation with attempted nephron sparing versus none, and she wished to proceed with left robotic partial versus radical nephrectomy.  We discussed preoperatively given the endophytic nature of this mass that was at least a 70% chance of need for radical nephrectomy.  She has good very understanding of this.  Informed consent was obtained and placed in the medical record.  PROCEDURE IN DETAIL:  The patient being Heidi Torres, was verified. Procedure being left robotic partial  versus radical nephrectomy was confirmed.  Procedure was carried out.  Time-out was performed. Intravenous antibiotics were administered.  General endotracheal anesthesia was introduced.  The patient was placed into a left side up full flank position, employing 15 degrees of stable flexion, superior arm elevator, axillary roll, sequential compression devices, bottom leg bent, top leg straight.  She was further fashioned to the operating table using beanbag and 3-inch tape over foam padding across her supraxiphoid chest and her pelvis.  Sterile field was created by prepping and draping the patient's entire left flank and abdomen using chlorhexidine gluconate after Foley catheter was placed.  Next, a high- flow, low-pressure pneumoperitoneum was obtained using Veress technique in the left lower quadrant having passed the aspiration and drop test. An 8-mm robotic camera port was then placed in position approximately 4 fingerbreadths superolateral to the umbilicus.  Laparoscopic examination of the peritoneal cavity revealed no significant adhesions and no visceral injury.  Additional ports were then placed as follows.  The left subcostal 8-mm robotic port, left far lateral 8-mm robotic port approximately 3 fingerbreadths superomedial to the anterior iliac spine, left paramedian inferior robotic port approximately 4 fingerbreadths superior to the pubic ramus and two 12-mm assistant port sites in the midline, one in the supraumbilical crease and another one 2 fingerbreadths  above the plane of the camera port, being in AirSeal type.  Robot was docked and passed through the electronic checks. Initial attention was directed at development of the retroperitoneum. Incision was made lateral to the descending colon from the area of the splenic flexure towards the area of the internal ring and the colon was carefully swept medially.  The anterior surface of the Gerota's fascia was identified and the  colonic mesentery was carefully freed away from this.  Anterolateral splenic attachments were taken down using cautery scissors, which allowed the spleen to rotate medially and self-retract. The tail of the pancreas was swept away from the anterior surface of the Gerota's fascia.  Lower pole of the kidney area was identified, placed on gentle lateral traction, dissection proceeded medial to this.  The gonadal vein and ureter were encountered, placed on gentle lateral traction.  Dissection proceeded within the triangle of the ureter and gonadal vessels and psoas musculature towards the area of the renal hilum.  Renal hilum consisted of single artery, single vein, renovascular anatomy as anticipated.  The artery was marked with vessel loop.  Attention was directed at identification of the mass.  Using external visual cues and visualization intraoperatively of the patient's MRI, the area of mass was determined and found immediately posterior and superior to the area of the UPJ.  The mass area appeared to be nearly 100% endophytic grossly to help confirm this.  Intraoperative ultrasound was performed.  Intraoperative ultrasound revealed a solid and cystic hypoechoic mass relative to the kidney.  This was indeed nearly 100% endophytic and posterior to the area of the UPJ.  Given the endophytic nature of this and the location, the mass was not felt to be amenable to nephron sparing whatsoever.  We discussed this preoperatively with the patient. This very scenario being quite likely and she adamantly wished to proceed with radical nephrectomy, this occurred.  Therefore, attention was made to proceed in this fashion.  The artery was controlled using extra-large clip proximally followed by vascular load stapler distally. The vein was controlled using vascular load stapler, further superomedial attachments in the area of the adrenal were taken down using vascular load stapler.  Superior and  lateral attachments were taken down using cautery scissors.  The ureter and gonadal vessels were doubly clipped and ligated.  This completely freed up the left radical nephrectomy specimen, which was placed into a large endoscopic retrieval bag.  Robot was then undocked.  Specimen was retrieved by extending the previous 12-mm assistant port sites in the midline and removing the radical nephrectomy specimen, setting it aside for permanent pathology. The extraction site was closed at the level of the fascia using figure- of-eight PDS x6.  All incision sites were infiltrated with dilute lyophilized Marcaine and closed at the level of the skin using subcuticular Monocryl followed by Dermabond, procedure was then terminated.  The patient tolerated the procedure well.  There were no immediate periprocedural complications.  The patient was taken to the postanesthesia care unit in stable condition.  Please note Clemetine Marker PA was critical assistant for all robotic portions of procedure today. She provided crucial vascular clipping, vascular stapeling, specimen manipulation, retration, withtout which this surgery would not be possible.         ______________________________ Alexis Frock, MD     TM/MEDQ  D:  02/24/2017  T:  02/24/2017  Job:  [id number]

## 2017-03-12 NOTE — Progress Notes (Signed)
[place]  Telephone:(336) [phone] Fax:(336) [phone]  ID: Heidi Torres OB: 26-Jun-1955  MR#: [id number]  TGG#:[id number]  Patient Care Team: [name], MD as PCP - General (Family Medicine)  CHIEF COMPLAINT: Stage III clear cell renal cell carcinoma of the left kidney  INTERVAL HISTORY: Patient is a 62 year old female who was noted incidentally to have a 1.8 cm mass on her left kidney.  Subsequent nephrectomy review of the above stated renal cell carcinoma.  Patient is anxious, but otherwise feels well.  She is nearly fully recovered from her surgery.  She has no neurologic complaints.  She denies any recent fevers or illnesses.  She has a good appetite and denies weight loss.  She has no chest pain or shortness of breath.  She denies any nausea, vomiting, constipation, or diarrhea.  She has no urinary complaints.  Patient offers no further specific complaints.  REVIEW OF SYSTEMS:   Review of Systems  Constitutional: Negative.  Negative for fever, malaise/fatigue and weight loss.  Respiratory: Negative.  Negative for cough and shortness of breath.   Cardiovascular: Negative.  Negative for chest pain and leg swelling.  Gastrointestinal: Negative.  Negative for abdominal pain, blood in stool and melena.  Genitourinary: Negative.  Negative for flank pain and hematuria.  Musculoskeletal: Negative.   Skin: Negative.  Negative for rash.  Neurological: Negative.  Negative for sensory change, focal weakness and weakness.  Psychiatric/Behavioral: The patient is nervous/anxious.     As per HPI. Otherwise, a complete review of systems is negative.  PAST MEDICAL HISTORY: Past Medical History:  Diagnosis Date  . Asthma   . Complication of anesthesia   . GERD (gastroesophageal reflux disease)   . Headache    occular HA  . Heart murmur   . History of hiatal hernia   . Hypertension   . Irregular heart beat    pac's and pvc's  . Pneumonia    11-2016  . PONV  (postoperative nausea and vomiting)     PAST SURGICAL HISTORY: Past Surgical History:  Procedure Laterality Date  . ABDOMINAL HYSTERECTOMY    . ABLATION     prior to hysterectomy  . CORONARY ANGIOPLASTY    . CYSTOSCOPY/RETROGRADE/URETEROSCOPY     01-20-17 Dr. Junious Silk  . CYSTOSCOPY/RETROGRADE/URETEROSCOPY Left 01/20/2017   Procedure: CYSTOSCOPY/RETROGRADE/URETEROSCOPY/ BIOPSY,BLADDER BIOPSY PLACEMENT STENT LEFT URETER;  Surgeon: Festus Aloe, MD;  Location: [facility];  Service: Urology;  Laterality: Left;  . DILATION AND CURETTAGE OF UTERUS    . NASAL ENDOSCOPY WITH EPISTAXIS CONTROL    . ROBOTIC ASSITED PARTIAL NEPHRECTOMY Left 02/24/2017   Procedure: XI ROBOTIC ASSITED LEFT  RADICAL NEPHRECTOMY;  Surgeon: Alexis Frock, MD;  Location: [facility];  Service: Urology;  Laterality: Left;  . TUBAL LIGATION      FAMILY HISTORY: Family History  Problem Relation Age of Onset  . Hypertension Brother     ADVANCED DIRECTIVES (Y/N):  N  HEALTH MAINTENANCE: Social History   Tobacco Use  . Smoking status: Never Smoker  . Smokeless tobacco: Never Used  Substance Use Topics  . Alcohol use: No  . Drug use: No     Colonoscopy:  PAP:  Bone density:  Lipid panel:  Allergies  Allergen Reactions  . Ciprofloxacin Other (See Comments)    Arms were numb  . Steri-Strip Compound Benzoin [Benzoin Compound] Other (See Comments)    Blisters    Current Outpatient Medications  Medication Sig Dispense Refill  . ADVAIR DISKUS 100-50 MCG/DOSE AEPB Take 2 puffs by  mouth See admin instructions. 2 puffs scheduled once in the morning, may repeat dose in the evening if wheezing.    Marland Kitchen ALPRAZolam (XANAX) 0.25 MG tablet Take 0.25 mg by mouth at bedtime as needed for anxiety. Anxiety.  5  . amLODipine (NORVASC) 5 MG tablet Take 5 mg by mouth daily.    Marland Kitchen atorvastatin (LIPITOR) 20 MG tablet Take 20 mg by mouth at bedtime.     Marland Kitchen dexlansoprazole (DEXILANT) 60 MG capsule Take 60 mg by mouth daily.    Marland Kitchen  estradiol (ESTRACE) 2 MG tablet Take 2 mg by mouth daily.  6  . lisinopril (PRINIVIL,ZESTRIL) 10 MG tablet Take 10 mg by mouth daily.    . Magnesium 250 MG TABS Take 250 mg by mouth daily.     . montelukast (SINGULAIR) 10 MG tablet Take 10 mg by mouth daily.    . potassium chloride (MICRO-K) 10 MEQ CR capsule Take 20 mEq by mouth daily.  11  . PROAIR HFA 108 (90 BASE) MCG/ACT inhaler Inhale 2 puffs into the lungs every 6 (six) hours as needed. For shortness of breath and wheezing.    . sucralfate (CARAFATE) 1 G tablet Take 1 g by mouth daily.     Marland Kitchen triamterene-hydrochlorothiazide (DYAZIDE) 37.5-25 MG per capsule Take 1 capsule by mouth daily.     No current facility-administered medications for this visit.     OBJECTIVE: Vitals:   03/15/17 1131 03/15/17 1138  BP:  111/68  Pulse:  76  Resp: 12   Temp:  97.6 F (36.4 C)     Body mass index is 24.95 kg/m.    ECOG FS:0 - Asymptomatic  General: Well-developed, well-nourished, no acute distress. Eyes: Pink conjunctiva, anicteric sclera. HEENT: Normocephalic, moist mucous membranes, clear oropharnyx. Lungs: Clear to auscultation bilaterally. Heart: Regular rate and rhythm. No rubs, murmurs, or gallops. Abdomen: Soft, nontender, nondistended. No organomegaly noted, normoactive bowel sounds. Musculoskeletal: No edema, cyanosis, or clubbing. Neuro: Alert, answering all questions appropriately. Cranial nerves grossly intact. Skin: No rashes or petechiae noted. Psych: Normal affect. Lymphatics: No cervical, calvicular, axillary or inguinal LAD.   LAB RESULTS:  Lab Results  Component Value Date   NA 138 02/25/2017   K 4.2 02/25/2017   CL 104 02/25/2017   CO2 24 02/25/2017   GLUCOSE 156 (H) 02/25/2017   BUN 13 02/25/2017   CREATININE 1.10 (H) 02/25/2017   CALCIUM 8.6 (L) 02/25/2017   PROT 7.3 09/07/2014   ALBUMIN 4.2 09/07/2014   AST 24 09/07/2014   ALT 22 09/07/2014   ALKPHOS 42 09/07/2014   BILITOT 0.4 09/07/2014   GFRNONAA  53 (L) 02/25/2017   GFRAA >60 02/25/2017    Lab Results  Component Value Date   WBC 13.0 (H) 02/17/2017   HGB 11.6 (L) 02/25/2017   HCT 36.3 02/25/2017   MCV 86.4 02/17/2017   PLT 360 02/17/2017     STUDIES: No results found.  ASSESSMENT: Stage III clear cell renal cell carcinoma of the left kidney  PLAN:    1. Stage III clear cell renal cell carcinoma of the left kidney: Patient brought her gross pathology specimen into clinic in order to deliver to a pathologist for a second opinion.  This was obtained confirming patient stage of disease.  After lengthy discussion, she is agreed to pursue adjuvant chemotherapy with Sutent 50 mg daily for 4 weeks with 2 weeks off.  Plan to do 9 cycles or a total of 1 year of treatment.  Will image  every 3-4 months while on Sutent to assess for possible progressive disease.  No further intervention is needed.  Patient's nephrectomy was on February 24, 2017.  Will wait at least 4-6 weeks prior to initiating adjuvant treatment.  Patient will return to clinic approximately 4 weeks after initiating Sutent.    Approximately 60 minutes was spent in discussion of which greater than 50% was consultation.  Patient expressed understanding and was in agreement with this plan. She also understands that She can call clinic at any time with any questions, concerns, or complaints.   Cancer Staging Cancer of left kidney [facility]) Staging form: Kidney, AJCC 8th Edition - Clinical stage from 03/12/2017: Stage III (cT3a, cN0, cM0) - Signed by Lloyd Huger, MD on 03/12/2017   Lloyd Huger, MD   03/18/2017 8:43 AM

## 2017-03-15 ENCOUNTER — Other Ambulatory Visit: Payer: Self-pay

## 2017-03-15 ENCOUNTER — Inpatient Hospital Stay: Payer: BC Managed Care – PPO | Attending: Oncology | Admitting: Oncology

## 2017-03-15 ENCOUNTER — Encounter: Payer: Self-pay | Admitting: Oncology

## 2017-03-15 VITALS — BP 111/68 | HR 76 | Temp 97.6°F | Resp 12 | Ht 66.0 in | Wt 154.6 lb

## 2017-03-15 DIAGNOSIS — Z905 Acquired absence of kidney: Secondary | ICD-10-CM | POA: Diagnosis not present

## 2017-03-15 DIAGNOSIS — C642 Malignant neoplasm of left kidney, except renal pelvis: Secondary | ICD-10-CM | POA: Diagnosis present

## 2017-03-15 DIAGNOSIS — I10 Essential (primary) hypertension: Secondary | ICD-10-CM | POA: Diagnosis not present

## 2017-03-15 NOTE — Progress Notes (Signed)
Patient here for initial visit. She has had surgery, and has been feeling nausea.

## 2017-03-17 LAB — SLIDE CONSULT, PATHOLOGY ARMC

## 2017-03-20 ENCOUNTER — Other Ambulatory Visit: Payer: Self-pay | Admitting: Oncology

## 2017-03-20 ENCOUNTER — Telehealth: Payer: Self-pay

## 2017-03-20 ENCOUNTER — Other Ambulatory Visit: Payer: Self-pay | Admitting: Pharmacist

## 2017-03-20 ENCOUNTER — Telehealth: Payer: Self-pay | Admitting: Oncology

## 2017-03-20 DIAGNOSIS — C642 Malignant neoplasm of left kidney, except renal pelvis: Secondary | ICD-10-CM

## 2017-03-20 LAB — PATHOLOGY

## 2017-03-20 MED ORDER — SUNITINIB MALATE 50 MG PO CAPS: 50.0000 mg | ORAL_CAPSULE | Freq: Every day | ORAL | 5 refills | Status: DC

## 2017-03-20 NOTE — Telephone Encounter (Signed)
Oral Oncology Patient Advocate Encounter  Received notification from [facility] that prior authorization for Sutent is required.  PA submitted on CoverMyMeds Key [id number] Status is pending  Oral Oncology Clinic will continue to follow.   Prescription will have to be sent out to [place] [phone]   [place] Patient Advocate [phone] 03/20/2017 2:40 PM

## 2017-03-20 NOTE — Telephone Encounter (Signed)
Oral Oncology Pharmacy Student Encounter   Received new prescription for Sutent (sunitinib) for the adjuvant treatment of Stage III clear renal cell carcinoma of the left kidney, planned duration for 9 cycles or a total of 1 year of treatment.   CBC and BMP from 02/17/17 assessed, no significant lab abnormalities noted. LVEF from 11/02/16 was 55%. No recent LFTs, thyroid function labs, or urinalysis. Will follow up with provider.  Prescription dose and frequency assessed.  Current medication list in Epic reviewed, no relevant with DDIs with Sutent identified.  Prescription has been e-scribed to [facility] for benefits analysis and approval.  Oral Oncology Clinic will continue to follow for insurance authorization, copayment issues, initial counseling and start date.

## 2017-03-21 DIAGNOSIS — C642 Malignant neoplasm of left kidney, except renal pelvis: Secondary | ICD-10-CM | POA: Insufficient documentation

## 2017-03-21 NOTE — Telephone Encounter (Signed)
Oral Oncology Patient Advocate Encounter  Prior Authorization for Sutent has been approved.    PA# [id number] Effective dates: 03/20/2017 through 03/21/2018  Oral Oncology Clinic will continue to follow.   [place] co-pay card.   [id number] Group: [id number] ID# [id number] EXP: 01/03/2019    [place] Patient Advocate [phone] 03/21/2017 8:04 AM

## 2017-03-22 MED ORDER — SUNITINIB MALATE 50 MG PO CAPS: 50.0000 mg | ORAL_CAPSULE | Freq: Every day | ORAL | 5 refills | Status: DC

## 2017-03-22 NOTE — Addendum Note (Signed)
Addended by: Darl Pikes on: 03/22/2017 01:31 PM   Modules accepted: Orders

## 2017-03-22 NOTE — Telephone Encounter (Signed)
Oral Chemotherapy Pharmacist Encounter   Due to insurance restriction, Sutent prescription had to be sent to [facility].    Darl Pikes, PharmD, BCPS Hematology/Oncology Clinical Pharmacist [facility] [place] [facility] [phone]  03/22/2017 1:30 PM

## 2017-03-23 ENCOUNTER — Telehealth: Payer: Self-pay | Admitting: Pharmacist

## 2017-03-23 ENCOUNTER — Telehealth: Payer: Self-pay | Admitting: *Deleted

## 2017-03-23 NOTE — Telephone Encounter (Signed)
Patient called asking if her path report is back yet. {lease return her call [phone]

## 2017-03-23 NOTE — Telephone Encounter (Signed)
I talked to her today.  Heidi Torres will follow up. I will see her 4 weeks after starting Sutent.

## 2017-03-23 NOTE — Telephone Encounter (Signed)
Oral Oncology Patient Advocate Encounter  Called [facility] to see if patients Sutent had been received and sent out. Per Andee Poles they needed insurance information. I gave it to her again. It was sent by fax with the prescription and co-pay card. I also verified they had the co-pay card and went over that information with them.  Will call Friday to check on.    [place] Patient Advocate [phone] 03/23/2017 12:21 PM

## 2017-03-23 NOTE — Telephone Encounter (Signed)
Oral Chemotherapy Pharmacist Encounter  Patient Education I spoke with patient for overview of new oral chemotherapy medication: Sutent (sunitinib) for the adjuvant treatment stage III renal cell carcinoma, planned duration of 9 cycles or a total of 1 year of treatment.   Pt is doing well. Counseled patient on administration, dosing, side effects, monitoring, drug-food interactions, safe handling, storage, and disposal. Patient will take 1 capsule (50 mg total) by mouth daily. For 28 days, then 14 days off.  Side effects include but not limited to: N/V/D, decreased WBC/plt/hgb, increased BP, fatigue, mouth irritation, decreased appetite, heartburn.    Reviewed with patient importance of keeping a medication schedule and plan for any missed doses.  Heidi Torres voiced understanding and appreciation. All questions answered. Medication handout placed in the mail. Patient knows she will have to come in for a lab appointment before starting her Sutent (sunitinib).  Provided patient with [facility] [place] [facility] phone number. Patient knows to call the office with questions or concerns. Oral Chemotherapy Navigation Clinic will continue to follow.  Darl Pikes, PharmD, BCPS Hematology/Oncology Clinical Pharmacist [facility] [place] [facility] [phone]  03/23/2017 4:23 PM

## 2017-03-24 ENCOUNTER — Telehealth: Payer: Self-pay | Admitting: Oncology

## 2017-03-24 ENCOUNTER — Other Ambulatory Visit: Payer: Self-pay | Admitting: *Deleted

## 2017-03-24 DIAGNOSIS — C649 Malignant neoplasm of unspecified kidney, except renal pelvis: Secondary | ICD-10-CM

## 2017-03-24 NOTE — Telephone Encounter (Signed)
Oral Oncology Patient Advocate Encounter  Called patient back to let her know about her insurance and her husbands.  I called and found out that they bill thru her husbands insurance because the co-pay is less thru his insurance.  Her co-pay is $682.48, but we got her a co-pay card that will take care of that for a year.   She was very thankful I called her back to provide her with this information.    Her husbands [employer] is thru [employer]  Bin: [id number] PCN: [id number] RX: [id number] [phone]  His DOB is 10/[birth year]1953 [id number]  [place] Patient Advocate [phone] 03/24/2017 1:42 PM

## 2017-03-27 ENCOUNTER — Inpatient Hospital Stay: Payer: BC Managed Care – PPO

## 2017-03-27 DIAGNOSIS — C642 Malignant neoplasm of left kidney, except renal pelvis: Secondary | ICD-10-CM | POA: Diagnosis not present

## 2017-03-27 DIAGNOSIS — C649 Malignant neoplasm of unspecified kidney, except renal pelvis: Secondary | ICD-10-CM

## 2017-03-27 LAB — COMPREHENSIVE METABOLIC PANEL
ALBUMIN: 4.3 g/dL (ref 3.5–5.0)
ALK PHOS: 58 U/L (ref 38–126)
ALT: 24 U/L (ref 14–54)
AST: 28 U/L (ref 15–41)
Anion gap: 9 (ref 5–15)
BILIRUBIN TOTAL: 0.6 mg/dL (ref 0.3–1.2)
BUN: 28 mg/dL — AB (ref 6–20)
CALCIUM: 9.6 mg/dL (ref 8.9–10.3)
CO2: 24 mmol/L (ref 22–32)
CREATININE: 1.21 mg/dL — AB (ref 0.44–1.00)
Chloride: 102 mmol/L (ref 101–111)
GFR calc Af Amer: 54 mL/min — ABNORMAL LOW (ref >60)
GFR, EST NON AFRICAN AMERICAN: 47 mL/min — AB (ref >60)
GLUCOSE: 92 mg/dL (ref 65–99)
POTASSIUM: 3.7 mmol/L (ref 3.5–5.1)
Sodium: 135 mmol/L (ref 135–145)
TOTAL PROTEIN: 7.4 g/dL (ref 6.5–8.1)

## 2017-03-27 LAB — URINALYSIS, COMPLETE (UACMP) WITH MICROSCOPIC
Bacteria, UA: NONE SEEN
Bilirubin Urine: NEGATIVE
Glucose, UA: NEGATIVE mg/dL
KETONES UR: NEGATIVE mg/dL
Leukocytes, UA: NEGATIVE
Nitrite: NEGATIVE
PH: 6 (ref 5.0–8.0)
Protein, ur: NEGATIVE mg/dL
SPECIFIC GRAVITY, URINE: 1.013 (ref 1.005–1.030)

## 2017-03-27 NOTE — Telephone Encounter (Addendum)
Oral Oncology Patient Advocate Encounter  Called [facility] to check on patients Sutent to see if it had been shipped out. They have been trying to get in touch with patient to set up shipment. I called patient and gave her the phone number to contact them. She is going to call them now to get it set up. [place] Patient Advocate [phone] 03/27/2017 9:49 AM

## 2017-03-28 ENCOUNTER — Telehealth: Payer: Self-pay | Admitting: Pharmacist

## 2017-03-28 LAB — THYROID PANEL WITH TSH
Free Thyroxine Index: 1.7 (ref 1.2–4.9)
T3 Uptake Ratio: 22 % — ABNORMAL LOW (ref 24–39)
T4 TOTAL: 7.7 ug/dL (ref 4.5–12.0)
TSH: 2.21 u[IU]/mL (ref 0.450–4.500)

## 2017-03-28 NOTE — Telephone Encounter (Signed)
Oral Chemotherapy Pharmacist Encounter   Baseline labs prior to Sutent (sunitinib) initiation: Reviewed LFTs, thyroid function labs, and urinalysis from 03/27/17, no relevant lab abnormalities.  Darl Pikes, PharmD, BCPS Hematology/Oncology Clinical Pharmacist [facility] [place] [facility] [phone]  03/28/2017 9:08 AM

## 2017-03-28 NOTE — Telephone Encounter (Signed)
Oral Oncology Patient Advocate Encounter  Called [facility] to see if patients Sutent was shipped out. Patient had spoken with them and she is concerned that her insurance requires her to call every 14 days to order her Sutent. I spoke with pharmacy and he explained due to the cost they start it off that way because the medication cost so much, after that it will be monthly. I asked if they had explained that to the patient, pharmacist is going to call her and go over that with her and try to set up shipment. Pharmacy will call me after they speak with patient.   [place] Patient Advocate [phone] 03/28/2017 9:25 AM

## 2017-03-28 NOTE — Telephone Encounter (Signed)
Oral Oncology Patient Advocate Encounter  [facility] Jenny Reichmann called back to let me know he could not get in touch with patient, but had left her a message to call him.  I tried to call patient and had to leave a message. Just want to make sure she gets her Sutent set up to ship.   [place] Patient Advocate [phone] 03/28/2017 9:43 AM

## 2017-03-29 NOTE — Telephone Encounter (Signed)
Oral Oncology Patient Advocate Encounter  Called [facility] to see if patients Sutent had been shipped. They are still trying to get in touch with the patient. I verified the phone numbers they have. I called and left a message for her to please call us. And also left the pharmacy number [phone].   [facility] Patient Advocate [phone] 03/29/2017 2:28 PM

## 2017-03-30 NOTE — Telephone Encounter (Signed)
Oral Oncology Patient Advocate Encounter  Per [facility] patient will receive her Sutent tomorrow. Per her insurance they will only ship 14 days supply. She will have to call for her next shipment. Patient was informed of this.    [place] Patient Advocate [phone] 03/30/2017 4:32 PM

## 2017-04-04 NOTE — Telephone Encounter (Signed)
Oral Chemotherapy Pharmacist Encounter   Spoke to Heidi Torres on the phone today. She is ready to get started on her Sutent (sunitinib). She planned to take her first pill today 04/04/17.   Reviewed dosing and administration. She stated her understanding.    Darl Pikes, PharmD, BCPS Hematology/Oncology Clinical Pharmacist [facility] [place] [facility] [phone]  04/04/2017 4:34 PM

## 2017-04-09 NOTE — Progress Notes (Signed)
[place]  Telephone:([phone] Fax:(336) [phone]  ID: Heidi Torres OB: 03-04-1955  MR#: [id number]  GEX#:[id number]  Patient Care Team: [name], MD as PCP - General (Family Medicine)  CHIEF COMPLAINT: Stage III clear cell renal cell carcinoma of the left kidney  INTERVAL HISTORY: Patient returns to clinic today for further evaluation and discussion of her adjuvant sorafenib.  She took several days worth of treatment, but then discontinued after reading about the side effects.  She currently feels well and is asymptomatic.  She continues to be anxious. She has no neurologic complaints.  She denies any recent fevers or illnesses.  She has a good appetite and denies weight loss.  She has no chest pain or shortness of breath.  She denies any nausea, vomiting, constipation, or diarrhea.  She has no urinary complaints.  Patient offers no further specific complaints.  REVIEW OF SYSTEMS:   Review of Systems  Constitutional: Negative.  Negative for fever, malaise/fatigue and weight loss.  Respiratory: Negative.  Negative for cough and shortness of breath.   Cardiovascular: Negative.  Negative for chest pain and leg swelling.  Gastrointestinal: Negative.  Negative for abdominal pain, blood in stool and melena.  Genitourinary: Negative.  Negative for dysuria, flank pain and hematuria.  Musculoskeletal: Negative.  Negative for back pain.  Skin: Negative.  Negative for rash.  Neurological: Negative.  Negative for sensory change, focal weakness and weakness.  Psychiatric/Behavioral: The patient is nervous/anxious.     As per HPI. Otherwise, a complete review of systems is negative.  PAST MEDICAL HISTORY: Past Medical History:  Diagnosis Date  . Asthma   . Complication of anesthesia   . GERD (gastroesophageal reflux disease)   . Headache    occular HA  . Heart murmur   . History of hiatal hernia   . Hypertension   . Irregular heart beat    pac's and  pvc's  . Pneumonia    11-2016  . PONV (postoperative nausea and vomiting)     PAST SURGICAL HISTORY: Past Surgical History:  Procedure Laterality Date  . ABDOMINAL HYSTERECTOMY    . ABLATION     prior to hysterectomy  . CORONARY ANGIOPLASTY    . CYSTOSCOPY/RETROGRADE/URETEROSCOPY     01-20-17 Dr. Junious Silk  . CYSTOSCOPY/RETROGRADE/URETEROSCOPY Left 01/20/2017   Procedure: CYSTOSCOPY/RETROGRADE/URETEROSCOPY/ BIOPSY,BLADDER BIOPSY PLACEMENT STENT LEFT URETER;  Surgeon: Festus Aloe, MD;  Location: [facility];  Service: Urology;  Laterality: Left;  . DILATION AND CURETTAGE OF UTERUS    . NASAL ENDOSCOPY WITH EPISTAXIS CONTROL    . ROBOTIC ASSITED PARTIAL NEPHRECTOMY Left 02/24/2017   Procedure: XI ROBOTIC ASSITED LEFT  RADICAL NEPHRECTOMY;  Surgeon: Alexis Frock, MD;  Location: [facility];  Service: Urology;  Laterality: Left;  . TUBAL LIGATION      FAMILY HISTORY: Family History  Problem Relation Age of Onset  . Hypertension Brother     ADVANCED DIRECTIVES (Y/N):  N  HEALTH MAINTENANCE: Social History   Tobacco Use  . Smoking status: Never Smoker  . Smokeless tobacco: Never Used  Substance Use Topics  . Alcohol use: No  . Drug use: No     Colonoscopy:  PAP:  Bone density:  Lipid panel:  Allergies  Allergen Reactions  . Ciprofloxacin Other (See Comments)    Arms were numb  . Steri-Strip Compound Benzoin [Benzoin Compound] Other (See Comments)    Blisters    Current Outpatient Medications  Medication Sig Dispense Refill  . ADVAIR DISKUS 100-50 MCG/DOSE AEPB Take 2  puffs by mouth See admin instructions. 2 puffs scheduled once in the morning, may repeat dose in the evening if wheezing.    Marland Kitchen amLODipine (NORVASC) 5 MG tablet Take 5 mg by mouth daily.    Marland Kitchen aspirin EC 81 MG tablet Take 81 mg by mouth daily.    Marland Kitchen atorvastatin (LIPITOR) 20 MG tablet Take 20 mg by mouth at bedtime.     Marland Kitchen estradiol (ESTRACE) 2 MG tablet Take 2 mg by mouth daily.  6  . montelukast  (SINGULAIR) 10 MG tablet Take 10 mg by mouth daily.    . pantoprazole (PROTONIX) 40 MG tablet Take 40 mg by mouth 2 (two) times daily.    . potassium chloride (MICRO-K) 10 MEQ CR capsule Take 20 mEq by mouth daily.  11  . sucralfate (CARAFATE) 1 G tablet Take 1 g by mouth 2 (two) times daily.     Marland Kitchen triamterene-hydrochlorothiazide (DYAZIDE) 37.5-25 MG per capsule Take 1 capsule by mouth daily.    Marland Kitchen ALPRAZolam (XANAX) 0.25 MG tablet Take 0.25 mg by mouth at bedtime as needed for anxiety. Anxiety.  5  . dexlansoprazole (DEXILANT) 60 MG capsule Take 60 mg by mouth daily.    Marland Kitchen lisinopril (PRINIVIL,ZESTRIL) 10 MG tablet Take 10 mg by mouth daily.    . Magnesium 250 MG TABS Take 250 mg by mouth daily.     Marland Kitchen PROAIR HFA 108 (90 BASE) MCG/ACT inhaler Inhale 2 puffs into the lungs every 6 (six) hours as needed. For shortness of breath and wheezing.    . SUNItinib (SUTENT) 50 MG capsule Take 1 capsule (50 mg total) by mouth daily. For 28 days, then 14 days off. (Patient not taking: Reported on 04/12/2017) 28 capsule 5   No current facility-administered medications for this visit.     OBJECTIVE: Vitals:   04/12/17 1420  BP: 140/85  Pulse: 67  Resp: 18  Temp: (!) 96.7 F (35.9 C)     Body mass index is 25.52 kg/m.    ECOG FS:0 - Asymptomatic  General: Well-developed, well-nourished, no acute distress. Eyes: Pink conjunctiva, anicteric sclera. Lungs: Clear to auscultation bilaterally. Heart: Regular rate and rhythm. No rubs, murmurs, or gallops. Abdomen: Soft, nontender, nondistended. No organomegaly noted, normoactive bowel sounds. Musculoskeletal: No edema, cyanosis, or clubbing. Neuro: Alert, answering all questions appropriately. Cranial nerves grossly intact. Skin: No rashes or petechiae noted. Psych: Normal affect.  LAB RESULTS:  Lab Results  Component Value Date   NA 135 03/27/2017   K 3.7 03/27/2017   CL 102 03/27/2017   CO2 24 03/27/2017   GLUCOSE 92 03/27/2017   BUN 28 (H)  03/27/2017   CREATININE 1.21 (H) 03/27/2017   CALCIUM 9.6 03/27/2017   PROT 7.4 03/27/2017   ALBUMIN 4.3 03/27/2017   AST 28 03/27/2017   ALT 24 03/27/2017   ALKPHOS 58 03/27/2017   BILITOT 0.6 03/27/2017   GFRNONAA 47 (L) 03/27/2017   GFRAA 54 (L) 03/27/2017    Lab Results  Component Value Date   WBC 13.0 (H) 02/17/2017   HGB 11.6 (L) 02/25/2017   HCT 36.3 02/25/2017   MCV 86.4 02/17/2017   PLT 360 02/17/2017     STUDIES: No results found.  ASSESSMENT: Stage III clear cell renal cell carcinoma of the left kidney  PLAN:    1. Stage III clear cell renal cell carcinoma of the left kidney: Patient's nephrectomy was on February 24, 2017. Patient has agreed to reinitiate sorafenib and plans to do so after  a trip to [place] later this week.  Plan to give 50 mg daily for 4 weeks with 2 weeks off. Will do 9 cycles or a total of 1 year of treatment.  Will image every 3-4 months while on sorafenib to assess for possible progressive disease.  No further intervention is needed.  Return to clinic at the end of May or approximately 1 month after initiating treatment for further evaluation.    Approximately 30 minutes spent in discussion of which greater than 50% was consultation.  Patient expressed understanding and was in agreement with this plan. She also understands that She can call clinic at any time with any questions, concerns, or complaints.   Cancer Staging Cancer of left kidney [facility]) Staging form: Kidney, AJCC 8th Edition - Clinical stage from 03/12/2017: Stage III (cT3a, cN0, cM0) - Signed by Lloyd Huger, MD on 03/12/2017   Lloyd Huger, MD   04/15/2017 8:03 AM

## 2017-04-11 ENCOUNTER — Telehealth: Payer: Self-pay | Admitting: Pharmacist

## 2017-04-11 NOTE — Telephone Encounter (Signed)
Oral Chemotherapy Pharmacist Encounter   Attempted to reach patient for follow up on oral medication: Sutent (sunitinib). No answer. Left VM for patient to call back.    Darl Pikes, PharmD, BCPS Hematology/Oncology Clinical Pharmacist [facility] [place] [facility] [phone]  04/11/2017 4:13 PM

## 2017-04-12 ENCOUNTER — Other Ambulatory Visit: Payer: Self-pay

## 2017-04-12 ENCOUNTER — Inpatient Hospital Stay: Payer: BC Managed Care – PPO | Attending: Oncology | Admitting: Oncology

## 2017-04-12 VITALS — BP 140/85 | HR 67 | Temp 96.7°F | Resp 18 | Wt 158.1 lb

## 2017-04-12 DIAGNOSIS — C642 Malignant neoplasm of left kidney, except renal pelvis: Secondary | ICD-10-CM | POA: Diagnosis not present

## 2017-04-12 NOTE — Progress Notes (Signed)
Here for follow up. Stated less fatigued. Stated chronic GI issues continue

## 2017-05-05 ENCOUNTER — Telehealth: Payer: Self-pay | Admitting: *Deleted

## 2017-05-05 ENCOUNTER — Telehealth: Payer: Self-pay | Admitting: Nurse Practitioner

## 2017-05-05 NOTE — Telephone Encounter (Signed)
Patient called and reports that she is having trouble with her b/p and would like a return call. I returned her call and she reports that it was high for a couple of days and it is down this morning and that her pulse is low in 40 - 50 range. She spoke with the Nephrologist yesterday and they advised she add an extra Lisinopril. Discussed with Alease Medina, NP and she asked to speak with the patient, call was transferred to [facility]

## 2017-05-05 NOTE — Telephone Encounter (Signed)
Spoke to Dr. Grayland Ormond regarding patient's report of bradycardia. He advised her to have her follow-up with PCP who is currently managing her BP medications as he feels this is unlikely related to the Sutent. Called patient with this information who agrees to make contact with her PCP.

## 2017-05-05 NOTE — Telephone Encounter (Signed)
Spoke to patient. She reports BP in 140s/80s range but is concerned with newly decreased pulse. She denies being symptomatic. She has recently restarted Sutent. Sent message to Dr. Grayland Ormond to advise and will plan to follow-up with patient per his recommendation.

## 2017-05-08 ENCOUNTER — Ambulatory Visit: Payer: BC Managed Care – PPO | Admitting: Oncology

## 2017-05-14 ENCOUNTER — Encounter: Payer: Self-pay | Admitting: Oncology

## 2017-05-22 ENCOUNTER — Other Ambulatory Visit: Payer: Self-pay | Admitting: *Deleted

## 2017-05-22 DIAGNOSIS — R197 Diarrhea, unspecified: Secondary | ICD-10-CM

## 2017-05-22 DIAGNOSIS — Z5189 Encounter for other specified aftercare: Secondary | ICD-10-CM

## 2017-05-23 ENCOUNTER — Inpatient Hospital Stay (HOSPITAL_BASED_OUTPATIENT_CLINIC_OR_DEPARTMENT_OTHER): Payer: Commercial Managed Care - PPO | Admitting: Nurse Practitioner

## 2017-05-23 ENCOUNTER — Inpatient Hospital Stay: Payer: Commercial Managed Care - PPO

## 2017-05-23 ENCOUNTER — Inpatient Hospital Stay: Payer: Commercial Managed Care - PPO | Attending: Nurse Practitioner

## 2017-05-23 ENCOUNTER — Encounter: Payer: Self-pay | Admitting: Nurse Practitioner

## 2017-05-23 VITALS — BP 123/78 | HR 55 | Temp 97.3°F | Resp 18 | Ht 66.0 in | Wt 159.6 lb

## 2017-05-23 DIAGNOSIS — R530 Neoplastic (malignant) related fatigue: Secondary | ICD-10-CM

## 2017-05-23 DIAGNOSIS — K521 Toxic gastroenteritis and colitis: Secondary | ICD-10-CM | POA: Diagnosis not present

## 2017-05-23 DIAGNOSIS — C642 Malignant neoplasm of left kidney, except renal pelvis: Secondary | ICD-10-CM | POA: Diagnosis present

## 2017-05-23 DIAGNOSIS — T451X5A Adverse effect of antineoplastic and immunosuppressive drugs, initial encounter: Secondary | ICD-10-CM

## 2017-05-23 DIAGNOSIS — R197 Diarrhea, unspecified: Secondary | ICD-10-CM

## 2017-05-23 DIAGNOSIS — R5381 Other malaise: Secondary | ICD-10-CM | POA: Diagnosis not present

## 2017-05-23 DIAGNOSIS — E876 Hypokalemia: Secondary | ICD-10-CM | POA: Insufficient documentation

## 2017-05-23 DIAGNOSIS — Z5189 Encounter for other specified aftercare: Secondary | ICD-10-CM

## 2017-05-23 LAB — COMPREHENSIVE METABOLIC PANEL
ALBUMIN: 3.7 g/dL (ref 3.5–5.0)
ALT: 52 U/L (ref 14–54)
AST: 59 U/L — ABNORMAL HIGH (ref 15–41)
Alkaline Phosphatase: 60 U/L (ref 38–126)
Anion gap: 11 (ref 5–15)
BUN: 21 mg/dL — ABNORMAL HIGH (ref 6–20)
CHLORIDE: 98 mmol/L — AB (ref 101–111)
CO2: 26 mmol/L (ref 22–32)
CREATININE: 1.17 mg/dL — AB (ref 0.44–1.00)
Calcium: 9.6 mg/dL (ref 8.9–10.3)
GFR calc non Af Amer: 49 mL/min — ABNORMAL LOW (ref >60)
GFR, EST AFRICAN AMERICAN: 57 mL/min — AB (ref >60)
GLUCOSE: 92 mg/dL (ref 65–99)
Potassium: 3.9 mmol/L (ref 3.5–5.1)
Sodium: 135 mmol/L (ref 135–145)
Total Bilirubin: 0.7 mg/dL (ref 0.3–1.2)
Total Protein: 6.6 g/dL (ref 6.5–8.1)

## 2017-05-23 LAB — CBC WITH DIFFERENTIAL/PLATELET
Basophils Absolute: 0 10*3/uL (ref 0–0.1)
Basophils Relative: 1 %
EOS ABS: 2.2 10*3/uL — AB (ref 0–0.7)
Eosinophils Relative: 33 %
HCT: 36.7 % (ref 35.0–47.0)
HEMOGLOBIN: 13.1 g/dL (ref 12.0–16.0)
Lymphocytes Relative: 29 %
Lymphs Abs: 1.9 10*3/uL (ref 1.0–3.6)
MCH: 29.5 pg (ref 26.0–34.0)
MCHC: 35.8 g/dL (ref 32.0–36.0)
MCV: 82.3 fL (ref 80.0–100.0)
MONOS PCT: 3 %
Monocytes Absolute: 0.2 10*3/uL (ref 0.2–0.9)
NEUTROS ABS: 2.3 10*3/uL (ref 1.4–6.5)
NEUTROS PCT: 34 %
Platelets: 132 10*3/uL — ABNORMAL LOW (ref 150–440)
RBC: 4.46 MIL/uL (ref 3.80–5.20)
RDW: 15 % — ABNORMAL HIGH (ref 11.5–14.5)
WBC: 6.6 10*3/uL (ref 3.6–11.0)

## 2017-05-23 LAB — MAGNESIUM: MAGNESIUM: 1.6 mg/dL — AB (ref 1.7–2.4)

## 2017-05-23 NOTE — Progress Notes (Signed)
[facility] [place]  [phone] [phone]  Patient Care Team: [name], MD as PCP - General (Family Medicine)   Name of the patient: Heidi Torres  [id number]  [birth year]   Date of visit: 05/23/17  Diagnosis- Stage III clear cell renal cell carcinoma of left kidney  Chief complaint/ Reason for visit- fatigue, indigestion, hptn, diarrhea  Heme/Onc history: Patient last evaluated by primary oncologist, Dr. Grayland Ormond, on 04/12/2017.  She has below history of stage III clear cell renal cell carcinoma of the left kidney.  Patient initially presented a 62 year old female who was noted to have incidental finding of 1.8 cm mass on her left kidney.  Patient underwent robotic left nephrectomy on 02/24/17.  Pathology revealed clear cell renal cell carcinoma, Fuhrman grade 2.  Margins were negative.  One lymph node negative for malignancy.  Adrenal gland unremarkable. Plan for 28 days of Sutent 50mg  daily with 14 days off. Started 04/04/2017. Plant to complete 9 cycles of treatment or total of 1 year of treatment. Plans to re-image every 3-4 months while on Sutent to assess for possible progression.    Interval history- Patient presents to [facility] with complaints of significant fatigue.  Symptoms started shortly after starting Sutent.  She describes symptoms as significant and sleeps upwards of 18 hours/day.  She says sleep is nonrestorative.  She says she is not able to perform her normal activities including feeding animals due to overwhelming fatigue.  She has difficulty completing tasks due to tiredness.  Symptoms have had significant emotional strain to her including sadness, frustration, and irritability.  She says she stopped medication several days ago with hopes that symptoms would improve.  She is unaware of any relieving factors.  She has tried resting more without improvement in her symptoms.  She says she has an upcoming trip to  [place] scheduled and is very concerned how her symptoms will impact the trip.  She also endorses recent diarrhea that she had hoped would resolve spontaneously.  Reports 3-4 loose stools per day.  ECOG FS:1 - Symptomatic but completely ambulatory  Review of systems- Review of Systems  Constitutional: Positive for malaise/fatigue. Negative for chills, fever and weight loss.  HENT: Negative for congestion, ear discharge, ear pain, sinus pain, sore throat and tinnitus.   Eyes: Negative.   Respiratory: Negative.  Negative for cough, sputum production and shortness of breath.   Cardiovascular: Negative for chest pain, palpitations, orthopnea, claudication and leg swelling.  Gastrointestinal: Positive for diarrhea. Negative for abdominal pain, blood in stool, constipation, heartburn, nausea and vomiting.  Genitourinary: Negative.   Musculoskeletal: Negative.   Skin: Negative.   Neurological: Positive for weakness. Negative for dizziness, tingling and headaches.  Endo/Heme/Allergies: Negative.   Psychiatric/Behavioral: Positive for depression. The patient is nervous/anxious.        Hypersomnia     Current treatment- adjuvant Sutent  Allergies  Allergen Reactions  . Ciprofloxacin Other (See Comments)    Arms were numb  . Steri-Strip Compound Benzoin [Benzoin Compound] Other (See Comments)    Blisters     Past Medical History:  Diagnosis Date  . Asthma   . Complication of anesthesia   . GERD (gastroesophageal reflux disease)   . Headache    occular HA  . Heart murmur   . History of hiatal hernia   . Hypertension   . Irregular heart beat    pac's and pvc's  . Pneumonia    11-2016  . PONV (postoperative nausea and  vomiting)      Past Surgical History:  Procedure Laterality Date  . ABDOMINAL HYSTERECTOMY    . ABLATION     prior to hysterectomy  . CORONARY ANGIOPLASTY    . CYSTOSCOPY/RETROGRADE/URETEROSCOPY     01-20-17 Dr. Junious Silk  . CYSTOSCOPY/RETROGRADE/URETEROSCOPY  Left 01/20/2017   Procedure: CYSTOSCOPY/RETROGRADE/URETEROSCOPY/ BIOPSY,BLADDER BIOPSY PLACEMENT STENT LEFT URETER;  Surgeon: Festus Aloe, MD;  Location: [facility];  Service: Urology;  Laterality: Left;  . DILATION AND CURETTAGE OF UTERUS    . NASAL ENDOSCOPY WITH EPISTAXIS CONTROL    . ROBOTIC ASSITED PARTIAL NEPHRECTOMY Left 02/24/2017   Procedure: XI ROBOTIC ASSITED LEFT  RADICAL NEPHRECTOMY;  Surgeon: Alexis Frock, MD;  Location: [facility];  Service: Urology;  Laterality: Left;  . TUBAL LIGATION      Social History   Socioeconomic History  . Marital status: Married    Spouse name: Not on file  . Number of children: Not on file  . Years of education: Not on file  . Highest education level: Not on file  Occupational History  . Not on file  Social Needs  . Financial resource strain: Not on file  . Food insecurity:    Worry: Not on file    Inability: Not on file  . Transportation needs:    Medical: Not on file    Non-medical: Not on file  Tobacco Use  . Smoking status: Never Smoker  . Smokeless tobacco: Never Used  Substance and Sexual Activity  . Alcohol use: No  . Drug use: No  . Sexual activity: Yes  Lifestyle  . Physical activity:    Days per week: Not on file    Minutes per session: Not on file  . Stress: Not on file  Relationships  . Social connections:    Talks on phone: Not on file    Gets together: Not on file    Attends religious service: Not on file    Active member of club or organization: Not on file    Attends meetings of clubs or organizations: Not on file    Relationship status: Not on file  . Intimate partner violence:    Fear of current or ex partner: Not on file    Emotionally abused: Not on file    Physically abused: Not on file    Forced sexual activity: Not on file  Other Topics Concern  . Not on file  Social History Narrative  . Not on file    Family History  Problem Relation Age of Onset  . Hypertension Brother      Current  Outpatient Medications:  .  ADVAIR DISKUS 100-50 MCG/DOSE AEPB, Take 2 puffs by mouth See admin instructions. 2 puffs scheduled once in the morning, may repeat dose in the evening if wheezing., Disp: , Rfl:  .  amLODipine (NORVASC) 5 MG tablet, Take 10 mg by mouth daily. , Disp: , Rfl:  .  aspirin EC 81 MG tablet, Take 81 mg by mouth daily., Disp: , Rfl:  .  atorvastatin (LIPITOR) 20 MG tablet, Take 20 mg by mouth at bedtime. , Disp: , Rfl:  .  Cholecalciferol (VITAMIN D3) 1000 units CAPS, Take 1 capsule by mouth daily., Disp: , Rfl:  .  estradiol (ESTRACE) 2 MG tablet, Take 2 mg by mouth daily., Disp: , Rfl: 6 .  lisinopril (PRINIVIL,ZESTRIL) 10 MG tablet, Take 10 mg by mouth daily., Disp: , Rfl:  .  montelukast (SINGULAIR) 10 MG tablet, Take 10 mg by mouth  daily., Disp: , Rfl:  .  pantoprazole (PROTONIX) 40 MG tablet, Take 40 mg by mouth 2 (two) times daily., Disp: , Rfl:  .  potassium chloride (MICRO-K) 10 MEQ CR capsule, Take 20 mEq by mouth daily., Disp: , Rfl: 11 .  PROAIR HFA 108 (90 BASE) MCG/ACT inhaler, Inhale 2 puffs into the lungs every 6 (six) hours as needed. For shortness of breath and wheezing., Disp: , Rfl:  .  sucralfate (CARAFATE) 1 G tablet, Take 1 g by mouth 2 (two) times daily. , Disp: , Rfl:  .  triamterene-hydrochlorothiazide (DYAZIDE) 37.5-25 MG per capsule, Take 1 capsule by mouth daily., Disp: , Rfl:  .  ALPRAZolam (XANAX) 0.25 MG tablet, Take 0.25 mg by mouth at bedtime as needed for anxiety. Anxiety., Disp: , Rfl: 5 .  dexlansoprazole (DEXILANT) 60 MG capsule, Take 60 mg by mouth daily., Disp: , Rfl:  .  Magnesium 250 MG TABS, Take 250 mg by mouth daily. , Disp: , Rfl:  .  SUNItinib (SUTENT) 50 MG capsule, Take 1 capsule (50 mg total) by mouth daily. For 28 days, then 14 days off. (Patient not taking: Reported on 04/12/2017), Disp: 28 capsule, Rfl: 5  Physical exam:  Vitals:   05/23/17 1348  BP: 123/78  Pulse: (!) 55  Resp: 18  Temp: (!) 97.3 F (36.3 C)    TempSrc: Tympanic  Weight: 159 lb 9.6 oz (72.4 kg)  Height: 5\' 6"  (1.676 m)   Physical Exam  Constitutional: She is oriented to person, place, and time. She appears well-developed and well-nourished.  HENT:  Head: Atraumatic.  Nose: Nose normal.  Mouth/Throat: Oropharynx is clear and moist. No oropharyngeal exudate.  Eyes: Conjunctivae are normal. No scleral icterus.  Neck: Normal range of motion.  Cardiovascular: Normal rate, regular rhythm and normal heart sounds.  Pulmonary/Chest: Effort normal and breath sounds normal.  Abdominal: Soft. Bowel sounds are normal.  Musculoskeletal: She exhibits no edema.  Neurological: She is alert and oriented to person, place, and time.  Skin: Skin is warm and dry.  Psychiatric: Her mood appears anxious. She exhibits a depressed mood.  Tearful     CMP Latest Ref Rng & Units 05/23/2017  Glucose 65 - 99 mg/dL 92  BUN 6 - 20 mg/dL 21(H)  Creatinine 0.44 - 1.00 mg/dL 1.17(H)  Sodium 135 - 145 mmol/L 135  Potassium 3.5 - 5.1 mmol/L 3.9  Chloride 101 - 111 mmol/L 98(L)  CO2 22 - 32 mmol/L 26  Calcium 8.9 - 10.3 mg/dL 9.6  Total Protein 6.5 - 8.1 g/dL 6.6  Total Bilirubin 0.3 - 1.2 mg/dL 0.7  Alkaline Phos 38 - 126 U/L 60  AST 15 - 41 U/L 59(H)  ALT 14 - 54 U/L 52   CBC Latest Ref Rng & Units 02/25/2017  WBC 4.0 - 10.5 K/uL -  Hemoglobin 12.0 - 15.0 g/dL 11.6(L)  Hematocrit 36.0 - 46.0 % 36.3  Platelets 150 - 400 K/uL -    No images are attached to the encounter.  No results found.   Assessment and plan- Patient is a 62 y.o. female with renal cell carcinoma, s/p nephrectomy, currently on Sutent, who presents to [facility] for complaints of fatigue and weakness.   1.  Cancer of left kidney-stage III-currently on Sutent since 04/04/17. Medication held since 04/19/17 due to significant side effects.  Per Dr. Grayland Ormond, consider reimaging 07/04/17.   2.  Chemotherapy-induced diarrhea-multiple episodes of diarrhea since starting Sutent.  She had hoped  that it would  resolve spontaneously. Discussed usefulness of C diff and GI panel. If negative, could take Imodium.  Offered IV fluids but patient declined.  Could reconsider in future.  3.  Cancer related fatigue-patient has had significant fatigue disproportionate to activity level.  She has overall generalized weakness and difficulty concentrating holding attention.  Persistent hypersomnia and feels sleep is unrefreshing or nonrestorative.  Symptoms cause significant distress and she is tearful during today's visit.  Symptoms started shortly after starting Sutent.  She has history of anxiety but symptoms are considerably worse than baseline.  We discussed continuing to hold Sutent.  She was encouraged to schedule and perform exercise as research has demonstrated this can improve fatigue.   rtc on 06/01/17 for labs and follow-up with Dr. Grayland Ormond.   Visit Diagnosis 1. Cancer of left kidney ([place])   2. Neoplastic malignant related fatigue   3. Chemotherapy induced diarrhea     Patient expressed understanding and was in agreement with this plan. She also understands that She can call clinic at any time with any questions, concerns, or complaints.   A total of (25) minutes of face-to-face time was spent with this patient with greater than 50% of that time in counseling and care-coordination.   Beckey Rutter, DNP, AGNP-C [place] at [facility] [phone] (work cell) [phone] (office)

## 2017-05-23 NOTE — Progress Notes (Signed)
Pt states fatigue is bad-she sleeps at night 10 pm to 8 am and then can go back to bed and sleep til 1 pm and then she feels like she could take a nap in afternoon but tries not to. Diarrhea-4-5 liq. Stools a day , she has not taken any imodium. She is having high blood pressure and her nephrologist has increased b/p pill and added a new pill. She had 1 time of indigestion. She has sores in her mouth but they are not bad. She stopped taking the sutent on Friday of last week. She is drinking but she states that nephrology told her to take in more fluids.

## 2017-05-25 ENCOUNTER — Inpatient Hospital Stay: Payer: Commercial Managed Care - PPO

## 2017-05-25 ENCOUNTER — Inpatient Hospital Stay (HOSPITAL_BASED_OUTPATIENT_CLINIC_OR_DEPARTMENT_OTHER): Payer: Commercial Managed Care - PPO | Admitting: Nurse Practitioner

## 2017-05-25 ENCOUNTER — Encounter: Payer: Self-pay | Admitting: Nurse Practitioner

## 2017-05-25 VITALS — BP 120/78 | HR 65 | Temp 99.0°F | Resp 18

## 2017-05-25 DIAGNOSIS — R530 Neoplastic (malignant) related fatigue: Secondary | ICD-10-CM | POA: Diagnosis not present

## 2017-05-25 DIAGNOSIS — K521 Toxic gastroenteritis and colitis: Secondary | ICD-10-CM

## 2017-05-25 DIAGNOSIS — C642 Malignant neoplasm of left kidney, except renal pelvis: Secondary | ICD-10-CM | POA: Diagnosis not present

## 2017-05-25 DIAGNOSIS — E860 Dehydration: Secondary | ICD-10-CM

## 2017-05-25 DIAGNOSIS — R5381 Other malaise: Secondary | ICD-10-CM | POA: Diagnosis not present

## 2017-05-25 DIAGNOSIS — Z5189 Encounter for other specified aftercare: Secondary | ICD-10-CM

## 2017-05-25 MED ORDER — SODIUM CHLORIDE 0.9 % IV SOLN
Freq: Once | INTRAVENOUS | Status: AC
  Administered 2017-05-25: 15:00:00 via INTRAVENOUS
  Filled 2017-05-25: qty 1000

## 2017-05-25 NOTE — Progress Notes (Signed)
Symptom Management [place]  Telephone:([phone] [phone]  Patient Care Team: [name], MD as PCP - General (Family Medicine)   Name of the patient: Heidi Torres  [id number]  1955/06/22   Date of visit: 05/25/17  Diagnosis- Stage III clear cell renal cell carcinoma of the left kidney  Chief complaint/ Reason for visit- Fatigue & Malaise  Heme/Onc history: Last evaluated by primary oncologist, Dr. Grayland Ormond on 04/12/17. She is s/p left nephrectomy on 02/24/17. Currently on adjuvant sorafenib 50mg  daily x 4 weeks with 2 weeks off. Plan for total of 9 cycles or total of 1 year of treatment. Per Dr. Gary Fleet note, he plans to image every 3-4 months while on sorafenib to assess for possible progression of disease.   Interval history- Patient presents to [facility] today with complains of significant fatigue and malaise. She was seen in [facility] on 05/23/17 with similar complaints and declined IV fluids at that time. She was having mild diarrhea at that time. She opted for conservative management. She has treated diarrhea at home with 2-3 doses of imodium which have resolved her symptoms. Malaise and fatigue significantly interferes with her QOL. She noticed low normal BP this morning and held her BP medicine. She would like to try IV fluids today. Otherwise, she denies any neurologic complaints. She has no recent fevers or illnesses. She denies any easy bleeding or bruising. She has a good appetite and denies weight loss. She has no chest pain. She denies any nausea, vomiting, constipation, or diarrhea. She has no urinary complaints. Patient offers no further specific complaints today.  ECOG FS:1 - Symptomatic but completely ambulatory  Review of systems- Review of Systems  Constitutional: Positive for malaise/fatigue. Negative for chills and fever.  HENT: Negative.   Eyes: Negative.   Respiratory: Negative.   Cardiovascular: Negative.     Gastrointestinal: Positive for diarrhea. Negative for blood in stool, melena and vomiting.  Genitourinary: Negative.   Musculoskeletal: Negative.   Skin: Negative.   Neurological: Negative.   Endo/Heme/Allergies: Negative.   Psychiatric/Behavioral: The patient is nervous/anxious.      Current treatment- Sutent 50 mg (currently held)  Allergies  Allergen Reactions  . Ciprofloxacin Other (See Comments)    Arms were numb  . Steri-Strip Compound Benzoin [Benzoin Compound] Other (See Comments)    Blisters     Past Medical History:  Diagnosis Date  . Asthma   . Complication of anesthesia   . GERD (gastroesophageal reflux disease)   . Headache    occular HA  . Heart murmur   . History of hiatal hernia   . Hypertension   . Irregular heart beat    pac's and pvc's  . Pneumonia    11-2016  . PONV (postoperative nausea and vomiting)   . Renal cell cancer, left [facility])      Past Surgical History:  Procedure Laterality Date  . ABDOMINAL HYSTERECTOMY    . ABLATION     prior to hysterectomy  . CORONARY ANGIOPLASTY    . CYSTOSCOPY/RETROGRADE/URETEROSCOPY     01-20-17 Dr. Junious Silk  . CYSTOSCOPY/RETROGRADE/URETEROSCOPY Left 01/20/2017   Procedure: CYSTOSCOPY/RETROGRADE/URETEROSCOPY/ BIOPSY,BLADDER BIOPSY PLACEMENT STENT LEFT URETER;  Surgeon: Festus Aloe, MD;  Location: [facility];  Service: Urology;  Laterality: Left;  . DILATION AND CURETTAGE OF UTERUS    . NASAL ENDOSCOPY WITH EPISTAXIS CONTROL    . ROBOTIC ASSITED PARTIAL NEPHRECTOMY Left 02/24/2017   Procedure: XI ROBOTIC ASSITED LEFT  RADICAL NEPHRECTOMY;  Surgeon: [name],  Hubbard Robinson, MD;  Location: [facility];  Service: Urology;  Laterality: Left;  . TUBAL LIGATION      Social History   Socioeconomic History  . Marital status: Married    Spouse name: Not on file  . Number of children: Not on file  . Years of education: Not on file  . Highest education level: Not on file  Occupational History  . Not on file  Social Needs   . Financial resource strain: Not on file  . Food insecurity:    Worry: Not on file    Inability: Not on file  . Transportation needs:    Medical: Not on file    Non-medical: Not on file  Tobacco Use  . Smoking status: Never Smoker  . Smokeless tobacco: Never Used  Substance and Sexual Activity  . Alcohol use: No  . Drug use: No  . Sexual activity: Yes  Lifestyle  . Physical activity:    Days per week: Not on file    Minutes per session: Not on file  . Stress: Not on file  Relationships  . Social connections:    Talks on phone: Not on file    Gets together: Not on file    Attends religious service: Not on file    Active member of club or organization: Not on file    Attends meetings of clubs or organizations: Not on file    Relationship status: Not on file  . Intimate partner violence:    Fear of current or ex partner: Not on file    Emotionally abused: Not on file    Physically abused: Not on file    Forced sexual activity: Not on file  Other Topics Concern  . Not on file  Social History Narrative  . Not on file    Family History  Problem Relation Age of Onset  . Hypertension Brother      Current Outpatient Medications:  .  ADVAIR DISKUS 100-50 MCG/DOSE AEPB, Take 2 puffs by mouth See admin instructions. 2 puffs scheduled once in the morning, may repeat dose in the evening if wheezing., Disp: , Rfl:  .  ALPRAZolam (XANAX) 0.25 MG tablet, Take 0.25 mg by mouth at bedtime as needed for anxiety. Anxiety., Disp: , Rfl: 5 .  amLODipine (NORVASC) 5 MG tablet, Take 10 mg by mouth daily. , Disp: , Rfl:  .  aspirin EC 81 MG tablet, Take 81 mg by mouth daily., Disp: , Rfl:  .  atorvastatin (LIPITOR) 20 MG tablet, Take 20 mg by mouth at bedtime. , Disp: , Rfl:  .  Cholecalciferol (VITAMIN D3) 1000 units CAPS, Take 1 capsule by mouth daily., Disp: , Rfl:  .  dexlansoprazole (DEXILANT) 60 MG capsule, Take 60 mg by mouth daily., Disp: , Rfl:  .  estradiol (ESTRACE) 2 MG  tablet, Take 2 mg by mouth daily., Disp: , Rfl: 6 .  lisinopril (PRINIVIL,ZESTRIL) 10 MG tablet, Take 10 mg by mouth daily., Disp: , Rfl:  .  Magnesium 250 MG TABS, Take 250 mg by mouth daily. , Disp: , Rfl:  .  montelukast (SINGULAIR) 10 MG tablet, Take 10 mg by mouth daily., Disp: , Rfl:  .  pantoprazole (PROTONIX) 40 MG tablet, Take 40 mg by mouth 2 (two) times daily., Disp: , Rfl:  .  potassium chloride (MICRO-K) 10 MEQ CR capsule, Take 20 mEq by mouth daily., Disp: , Rfl: 11 .  PROAIR HFA 108 (90 BASE) MCG/ACT inhaler, Inhale 2 puffs into  the lungs every 6 (six) hours as needed. For shortness of breath and wheezing., Disp: , Rfl:  .  sucralfate (CARAFATE) 1 G tablet, Take 1 g by mouth 2 (two) times daily. , Disp: , Rfl:  .  SUNItinib (SUTENT) 50 MG capsule, Take 1 capsule (50 mg total) by mouth daily. For 28 days, then 14 days off., Disp: 28 capsule, Rfl: 5 .  triamterene-hydrochlorothiazide (DYAZIDE) 37.5-25 MG per capsule, Take 1 capsule by mouth daily., Disp: , Rfl:   Physical exam:  Vitals:   05/25/17 1501 05/25/17 1502 05/25/17 1630  BP: 120/78    Pulse: 65    Resp:   18  Temp: 99 F (37.2 C)    TempSrc: Tympanic    SpO2:  98%     GENERAL: Patient is a well appearing female in no acute distress HEENT:  Sclerae anicteric.  Oropharynx clear and moist. No ulcerations or evidence of oropharyngeal candidiasis.  LUNGS:  Fluent, strong speech. No wheezes or rhonchi. HEART:  Regular rate and rhythm.  ABDOMEN:  Soft, nontender.  Positive, normoactive bowel sounds. MSK:  No deformity or ambulatory aids EXTREMITIES:  No peripheral edema.   SKIN:  Clear with no obvious rashes or skin changes. No nail dyscrasia. NEURO:  Nonfocal. Well oriented.  Appropriate affect.   CMP Latest Ref Rng & Units 05/23/2017  Glucose 65 - 99 mg/dL 92  BUN 6 - 20 mg/dL 21(H)  Creatinine 0.44 - 1.00 mg/dL 1.17(H)  Sodium 135 - 145 mmol/L 135  Potassium 3.5 - 5.1 mmol/L 3.9  Chloride 101 - 111 mmol/L  98(L)  CO2 22 - 32 mmol/L 26  Calcium 8.9 - 10.3 mg/dL 9.6  Total Protein 6.5 - 8.1 g/dL 6.6  Total Bilirubin 0.3 - 1.2 mg/dL 0.7  Alkaline Phos 38 - 126 U/L 60  AST 15 - 41 U/L 59(H)  ALT 14 - 54 U/L 52   CBC Latest Ref Rng & Units 05/23/2017  WBC 3.6 - 11.0 K/uL 6.6  Hemoglobin 12.0 - 16.0 g/dL 13.1  Hematocrit 35.0 - 47.0 % 36.7  Platelets 150 - 440 K/uL 132(L)    No images are attached to the encounter.  No results found.   Assessment and plan- Patient is a 62 y.o. female with stage III clear cell renal cell carcinoma of left kidney s/p nephrectomy 02/24/17.   1. Cancer of left kidney- stage III- currently on Sutent which has been held since 17th due to significant side effects. Per Dr. Grayland Ormond, will plan for her to hold for 2 weeks then rtc on 06/01/17 to discuss imaging to evaluate response and consideration of dose adjustment.    2. Convalescence following chemotherapy- she has considerable fatigue. Diarrhea has resolved with 2-3 doses of Imodium.Suspect symptoms related to Sutent which is currently held. IV fluids given in clinic today.  If diarrhea does not improve holding Sutent, would consider GI panel and C Diff. Advised patient of this.   Patient advised to notify the clinic if there is no improvement in symptoms or if symptoms worsen in next 3-4 days. Otherwise, follow up with Dr. Grayland Ormond as scheduled.     Visit Diagnosis 1. Cancer of left kidney ([place])   2. Convalescence following chemotherapy     Patient expressed understanding and was in agreement with this plan. She also understands that She can call clinic at any time with any questions, concerns, or complaints.   Thank you for allowing me to participate in the care of this very pleasant  patient. Please feel free to contact me if any questions or concerns.   Beckey Rutter, DNP, AGNP-C [place] at [facility] [phone] (work cell) [phone] (office) 05/25/17 5:15 PM

## 2017-05-30 NOTE — Progress Notes (Signed)
Heidi Torres  [phone] [phone]  ID: Heidi Torres OB: 1955/04/15  MR#: [id number]  UDJ#:[id number]  Patient Care Team: [name], MD as PCP - General (Family Medicine)  CHIEF COMPLAINT: Stage III clear cell renal cell carcinoma of the left kidney  INTERVAL HISTORY: Patient returns to clinic today for further evaluation and discussion of reinitiating sorafenib.  She was having significant diarrhea related to her treatment, but since discontinuing this has resolved and she feels nearly back to her baseline. She continues to be anxious. She has no neurologic complaints.  She denies any recent fevers or illnesses.  She has a good appetite and denies weight loss.  She has no chest pain or shortness of breath.  She denies any nausea, vomiting, or constipation. She has no urinary complaints.  Patient offers no further specific complaints today.  REVIEW OF SYSTEMS:   Review of Systems  Constitutional: Negative.  Negative for fever, malaise/fatigue and weight loss.  Respiratory: Negative.  Negative for cough and shortness of breath.   Cardiovascular: Negative.  Negative for chest pain and leg swelling.  Gastrointestinal: Positive for diarrhea. Negative for abdominal pain, blood in stool and melena.  Genitourinary: Negative.  Negative for dysuria, flank pain and hematuria.  Musculoskeletal: Negative.  Negative for back pain.  Skin: Negative.  Negative for rash.  Neurological: Negative.  Negative for sensory change, focal weakness and weakness.  Psychiatric/Behavioral: The patient is nervous/anxious.     As per HPI. Otherwise, a complete review of systems is negative.  PAST MEDICAL HISTORY: Past Medical History:  Diagnosis Date  . Asthma   . Complication of anesthesia   . GERD (gastroesophageal reflux disease)   . Headache    occular HA  . Heart murmur   . History of hiatal hernia   . Hypertension   . Irregular heart beat    pac's and  pvc's  . Pneumonia    11-2016  . PONV (postoperative nausea and vomiting)   . Renal cell cancer, left ([place])     PAST SURGICAL HISTORY: Past Surgical History:  Procedure Laterality Date  . ABDOMINAL HYSTERECTOMY    . ABLATION     prior to hysterectomy  . CORONARY ANGIOPLASTY    . CYSTOSCOPY/RETROGRADE/URETEROSCOPY     01-20-17 Dr. Junious Silk  . CYSTOSCOPY/RETROGRADE/URETEROSCOPY Left 01/20/2017   Procedure: CYSTOSCOPY/RETROGRADE/URETEROSCOPY/ BIOPSY,BLADDER BIOPSY PLACEMENT STENT LEFT URETER;  Surgeon: Festus Aloe, MD;  Location: [facility];  Service: Urology;  Laterality: Left;  . DILATION AND CURETTAGE OF UTERUS    . NASAL ENDOSCOPY WITH EPISTAXIS CONTROL    . ROBOTIC ASSITED PARTIAL NEPHRECTOMY Left 02/24/2017   Procedure: XI ROBOTIC ASSITED LEFT  RADICAL NEPHRECTOMY;  Surgeon: Alexis Frock, MD;  Location: [facility];  Service: Urology;  Laterality: Left;  . TUBAL LIGATION      FAMILY HISTORY: Family History  Problem Relation Age of Onset  . Hypertension Brother     ADVANCED DIRECTIVES (Y/N):  N  HEALTH MAINTENANCE: Social History   Tobacco Use  . Smoking status: Never Smoker  . Smokeless tobacco: Never Used  Substance Use Topics  . Alcohol use: No  . Drug use: No     Colonoscopy:  PAP:  Bone density:  Lipid panel:  Allergies  Allergen Reactions  . Ciprofloxacin Other (See Comments)    Arms were numb  . Steri-Strip Compound Benzoin [Benzoin Compound] Other (See Comments)    Blisters    Current Outpatient Medications  Medication Sig Dispense Refill  . ADVAIR DISKUS  100-50 MCG/DOSE AEPB Take 2 puffs by mouth See admin instructions. 2 puffs scheduled once in the morning, may repeat dose in the evening if wheezing.    Marland Kitchen amLODipine (NORVASC) 5 MG tablet Take 5 mg by mouth daily.     Marland Kitchen aspirin EC 81 MG tablet Take 81 mg by mouth daily.    . Cholecalciferol (VITAMIN D3) 1000 units CAPS Take 1 capsule by mouth daily.    Marland Kitchen estradiol (ESTRACE) 2 MG tablet Take 2 mg  by mouth daily.  6  . lisinopril (PRINIVIL,ZESTRIL) 10 MG tablet Take 10 mg by mouth daily.    . montelukast (SINGULAIR) 10 MG tablet Take 10 mg by mouth daily.    . pantoprazole (PROTONIX) 40 MG tablet Take 40 mg by mouth 2 (two) times daily.    . potassium chloride (MICRO-K) 10 MEQ CR capsule Take 20 mEq by mouth daily.  11  . sucralfate (CARAFATE) 1 G tablet Take 1 g by mouth 2 (two) times daily.     Marland Kitchen triamterene-hydrochlorothiazide (DYAZIDE) 37.5-25 MG per capsule Take 1 capsule by mouth daily.    Marland Kitchen ALPRAZolam (XANAX) 0.25 MG tablet Take 0.25 mg by mouth at bedtime as needed for anxiety. Anxiety.  5  . Magnesium 250 MG TABS Take 250 mg by mouth daily.     Marland Kitchen PROAIR HFA 108 (90 BASE) MCG/ACT inhaler Inhale 2 puffs into the lungs every 6 (six) hours as needed. For shortness of breath and wheezing.    . SUNItinib (SUTENT) 37.5 MG capsule Take 1 capsule (37.5 mg total) by mouth daily. Take for 28 days on, then 14 days off. 28 capsule 5   No current facility-administered medications for this visit.     OBJECTIVE: Vitals:   06/01/17 1434  BP: 117/69  Pulse: 69  Resp: 18  Temp: (!) 97.3 F (36.3 C)     Body mass index is 26.23 kg/m.    ECOG FS:0 - Asymptomatic  General: Well-developed, well-nourished, no acute distress. Eyes: Pink conjunctiva, anicteric sclera. Lungs: Clear to auscultation bilaterally. Heart: Regular rate and rhythm. No rubs, murmurs, or gallops. Abdomen: Soft, nontender, nondistended. No organomegaly noted, normoactive bowel sounds. Musculoskeletal: No edema, cyanosis, or clubbing. Neuro: Alert, answering all questions appropriately. Cranial nerves grossly intact. Skin: No rashes or petechiae noted. Psych: Normal affect.  LAB RESULTS:  Lab Results  Component Value Date   NA 136 06/01/2017   K 3.1 (L) 06/01/2017   CL 102 06/01/2017   CO2 24 06/01/2017   GLUCOSE 148 (H) 06/01/2017   BUN 23 (H) 06/01/2017   CREATININE 1.19 (H) 06/01/2017   CALCIUM 9.3  06/01/2017   PROT 6.8 06/01/2017   ALBUMIN 3.9 06/01/2017   AST 27 06/01/2017   ALT 23 06/01/2017   ALKPHOS 66 06/01/2017   BILITOT 0.4 06/01/2017   GFRNONAA 48 (L) 06/01/2017   GFRAA 56 (L) 06/01/2017    Lab Results  Component Value Date   WBC 5.3 06/01/2017   NEUTROABS 1.6 06/01/2017   HGB 10.5 (L) 06/01/2017   HCT 29.6 (L) 06/01/2017   MCV 86.5 06/01/2017   PLT 221 06/01/2017     STUDIES: No results found.  ASSESSMENT: Stage III clear cell renal cell carcinoma of the left kidney  PLAN:    1. Stage III clear cell renal cell carcinoma of the left kidney: Patient's nephrectomy was on February 24, 2017.  Patient could not tolerate 50 mg sorafenib secondary to diarrhea.  She is agreed to reattempt treatment at  a dose reduction of 37.5 mg daily for 4 weeks, with 2 weeks off.  If patient can tolerate treatment, will plan to do a total of 9 cycles of 1 year of treatment.  If she cannot tolerate this dose, there is no further dose reductions.  Plan to reimage every 3 to 4 months to assess for any recurrent or progressive disease.  Return to clinic in approximately 5 weeks at the conclusion of cycle 1 for repeat laboratory work and further evaluation. 2.  Diarrhea: Secondary to sorafenib.  Resolved. 3.  Hypokalemia: Likely secondary to diarrhea, improving.  Continue oral supplementation.  Approximately 30 minutes was spent in discussion of which greater than 50% was consultation.  Patient expressed understanding and was in agreement with this plan. She also understands that She can call clinic at any time with any questions, concerns, or complaints.   Cancer Staging Cancer of left kidney [facility]) Staging form: Kidney, AJCC 8th Edition - Clinical stage from 03/12/2017: Stage III (cT3a, cN0, cM0) - Signed by Lloyd Huger, MD on 03/12/2017   Lloyd Huger, MD   06/07/2017 7:59 AM

## 2017-06-01 ENCOUNTER — Inpatient Hospital Stay (HOSPITAL_BASED_OUTPATIENT_CLINIC_OR_DEPARTMENT_OTHER): Payer: Commercial Managed Care - PPO | Admitting: Oncology

## 2017-06-01 ENCOUNTER — Other Ambulatory Visit: Payer: Self-pay | Admitting: Pharmacist

## 2017-06-01 ENCOUNTER — Other Ambulatory Visit: Payer: Self-pay

## 2017-06-01 ENCOUNTER — Other Ambulatory Visit: Payer: Self-pay | Admitting: *Deleted

## 2017-06-01 ENCOUNTER — Other Ambulatory Visit: Payer: BC Managed Care – PPO

## 2017-06-01 ENCOUNTER — Inpatient Hospital Stay: Payer: Commercial Managed Care - PPO

## 2017-06-01 ENCOUNTER — Ambulatory Visit: Payer: BC Managed Care – PPO | Admitting: Oncology

## 2017-06-01 VITALS — BP 117/69 | HR 69 | Temp 97.3°F | Resp 18 | Wt 162.5 lb

## 2017-06-01 DIAGNOSIS — K521 Toxic gastroenteritis and colitis: Secondary | ICD-10-CM | POA: Diagnosis not present

## 2017-06-01 DIAGNOSIS — C642 Malignant neoplasm of left kidney, except renal pelvis: Secondary | ICD-10-CM

## 2017-06-01 DIAGNOSIS — E876 Hypokalemia: Secondary | ICD-10-CM | POA: Diagnosis not present

## 2017-06-01 LAB — COMPREHENSIVE METABOLIC PANEL
ALBUMIN: 3.9 g/dL (ref 3.5–5.0)
ALT: 23 U/L (ref 14–54)
AST: 27 U/L (ref 15–41)
Alkaline Phosphatase: 66 U/L (ref 38–126)
Anion gap: 10 (ref 5–15)
BILIRUBIN TOTAL: 0.4 mg/dL (ref 0.3–1.2)
BUN: 23 mg/dL — AB (ref 6–20)
CO2: 24 mmol/L (ref 22–32)
CREATININE: 1.19 mg/dL — AB (ref 0.44–1.00)
Calcium: 9.3 mg/dL (ref 8.9–10.3)
Chloride: 102 mmol/L (ref 101–111)
GFR calc Af Amer: 56 mL/min — ABNORMAL LOW (ref >60)
GFR, EST NON AFRICAN AMERICAN: 48 mL/min — AB (ref >60)
GLUCOSE: 148 mg/dL — AB (ref 65–99)
POTASSIUM: 3.1 mmol/L — AB (ref 3.5–5.1)
Sodium: 136 mmol/L (ref 135–145)
TOTAL PROTEIN: 6.8 g/dL (ref 6.5–8.1)

## 2017-06-01 LAB — CBC WITH DIFFERENTIAL/PLATELET
BASOS ABS: 0 10*3/uL (ref 0–0.1)
Basophils Relative: 0 %
EOS PCT: 31 %
Eosinophils Absolute: 1.6 10*3/uL — ABNORMAL HIGH (ref 0–0.7)
HCT: 29.6 % — ABNORMAL LOW (ref 35.0–47.0)
Hemoglobin: 10.5 g/dL — ABNORMAL LOW (ref 12.0–16.0)
LYMPHS ABS: 1.8 10*3/uL (ref 1.0–3.6)
Lymphocytes Relative: 33 %
MCH: 30.7 pg (ref 26.0–34.0)
MCHC: 35.5 g/dL (ref 32.0–36.0)
MCV: 86.5 fL (ref 80.0–100.0)
Monocytes Absolute: 0.4 10*3/uL (ref 0.2–0.9)
Monocytes Relative: 7 %
NEUTROS PCT: 29 %
Neutro Abs: 1.6 10*3/uL (ref 1.4–6.5)
Platelets: 221 10*3/uL (ref 150–440)
RBC: 3.42 MIL/uL — AB (ref 3.80–5.20)
RDW: 16.1 % — ABNORMAL HIGH (ref 11.5–14.5)
WBC: 5.3 10*3/uL (ref 3.6–11.0)

## 2017-06-01 LAB — PHOSPHORUS: PHOSPHORUS: 2.3 mg/dL — AB (ref 2.5–4.6)

## 2017-06-01 LAB — MAGNESIUM: Magnesium: 1.9 mg/dL (ref 1.7–2.4)

## 2017-06-01 MED ORDER — SUNITINIB MALATE 37.5 MG PO CAPS: 37.5000 mg | ORAL_CAPSULE | Freq: Every day | ORAL | 5 refills | Status: DC

## 2017-06-01 NOTE — Progress Notes (Signed)
c 

## 2017-06-01 NOTE — Progress Notes (Signed)
Pt here for follow up. stated headache today -but" Im better than I was last week "

## 2017-06-05 ENCOUNTER — Encounter: Payer: Self-pay | Admitting: Nurse Practitioner

## 2017-06-05 DIAGNOSIS — R530 Neoplastic (malignant) related fatigue: Secondary | ICD-10-CM | POA: Insufficient documentation

## 2017-06-13 ENCOUNTER — Telehealth: Payer: Self-pay | Admitting: *Deleted

## 2017-06-13 NOTE — Telephone Encounter (Signed)
Called patient to discuss her concerns of diarrhea and indigestion. Discussed use of Imodium as diarrhea secondary to Sutent. Patient to try Imodium and will call clinic if persists. Advised to consume plenty of fluids to make up for fluid loss with diarrhea and continue potassium supplementation. Can add Zantac otc for indigestion. Patient thanked for call. All questions answered.

## 2017-06-13 NOTE — Telephone Encounter (Signed)
Patient called asking to speak with lauren because she is having digestive issues. Her return number is [phone]

## 2017-06-19 ENCOUNTER — Inpatient Hospital Stay (HOSPITAL_BASED_OUTPATIENT_CLINIC_OR_DEPARTMENT_OTHER): Payer: Commercial Managed Care - PPO | Admitting: Nurse Practitioner

## 2017-06-19 ENCOUNTER — Encounter: Payer: Self-pay | Admitting: Nurse Practitioner

## 2017-06-19 ENCOUNTER — Inpatient Hospital Stay: Payer: Commercial Managed Care - PPO

## 2017-06-19 ENCOUNTER — Other Ambulatory Visit: Payer: Self-pay | Admitting: *Deleted

## 2017-06-19 ENCOUNTER — Other Ambulatory Visit: Payer: Self-pay

## 2017-06-19 ENCOUNTER — Inpatient Hospital Stay: Payer: Commercial Managed Care - PPO | Attending: Oncology

## 2017-06-19 ENCOUNTER — Telehealth: Payer: Self-pay | Admitting: *Deleted

## 2017-06-19 VITALS — BP 133/75 | HR 54 | Temp 97.6°F | Resp 12 | Ht 66.0 in | Wt 158.6 lb

## 2017-06-19 DIAGNOSIS — R112 Nausea with vomiting, unspecified: Secondary | ICD-10-CM | POA: Diagnosis not present

## 2017-06-19 DIAGNOSIS — K219 Gastro-esophageal reflux disease without esophagitis: Secondary | ICD-10-CM | POA: Insufficient documentation

## 2017-06-19 DIAGNOSIS — N289 Disorder of kidney and ureter, unspecified: Secondary | ICD-10-CM | POA: Insufficient documentation

## 2017-06-19 DIAGNOSIS — C642 Malignant neoplasm of left kidney, except renal pelvis: Secondary | ICD-10-CM

## 2017-06-19 DIAGNOSIS — R001 Bradycardia, unspecified: Secondary | ICD-10-CM | POA: Diagnosis not present

## 2017-06-19 DIAGNOSIS — I10 Essential (primary) hypertension: Secondary | ICD-10-CM | POA: Insufficient documentation

## 2017-06-19 DIAGNOSIS — E860 Dehydration: Secondary | ICD-10-CM

## 2017-06-19 DIAGNOSIS — R197 Diarrhea, unspecified: Secondary | ICD-10-CM

## 2017-06-19 DIAGNOSIS — T451X5A Adverse effect of antineoplastic and immunosuppressive drugs, initial encounter: Secondary | ICD-10-CM

## 2017-06-19 DIAGNOSIS — I4891 Unspecified atrial fibrillation: Secondary | ICD-10-CM

## 2017-06-19 DIAGNOSIS — E079 Disorder of thyroid, unspecified: Secondary | ICD-10-CM | POA: Diagnosis not present

## 2017-06-19 DIAGNOSIS — R1013 Epigastric pain: Secondary | ICD-10-CM

## 2017-06-19 DIAGNOSIS — K521 Toxic gastroenteritis and colitis: Secondary | ICD-10-CM

## 2017-06-19 LAB — COMPREHENSIVE METABOLIC PANEL
ALT: 28 U/L (ref 14–54)
AST: 39 U/L (ref 15–41)
Albumin: 3.9 g/dL (ref 3.5–5.0)
Alkaline Phosphatase: 58 U/L (ref 38–126)
Anion gap: 9 (ref 5–15)
BUN: 22 mg/dL — ABNORMAL HIGH (ref 6–20)
CO2: 26 mmol/L (ref 22–32)
CREATININE: 1.41 mg/dL — AB (ref 0.44–1.00)
Calcium: 9.4 mg/dL (ref 8.9–10.3)
Chloride: 102 mmol/L (ref 101–111)
GFR calc non Af Amer: 39 mL/min — ABNORMAL LOW (ref >60)
GFR, EST AFRICAN AMERICAN: 45 mL/min — AB (ref >60)
Glucose, Bld: 114 mg/dL — ABNORMAL HIGH (ref 65–99)
POTASSIUM: 3.8 mmol/L (ref 3.5–5.1)
SODIUM: 137 mmol/L (ref 135–145)
Total Bilirubin: 0.7 mg/dL (ref 0.3–1.2)
Total Protein: 6.7 g/dL (ref 6.5–8.1)

## 2017-06-19 LAB — TSH: TSH: 11.986 u[IU]/mL — AB (ref 0.350–4.500)

## 2017-06-19 LAB — CBC WITH DIFFERENTIAL/PLATELET
Basophils Absolute: 0 10*3/uL (ref 0–0.1)
Basophils Relative: 1 %
EOS ABS: 1.2 10*3/uL — AB (ref 0–0.7)
Eosinophils Relative: 19 %
HEMATOCRIT: 39 % (ref 35.0–47.0)
HEMOGLOBIN: 13.7 g/dL (ref 12.0–16.0)
Lymphocytes Relative: 37 %
Lymphs Abs: 2.2 10*3/uL (ref 1.0–3.6)
MCH: 30.4 pg (ref 26.0–34.0)
MCHC: 35.3 g/dL (ref 32.0–36.0)
MCV: 86.1 fL (ref 80.0–100.0)
MONOS PCT: 3 %
Monocytes Absolute: 0.2 10*3/uL (ref 0.2–0.9)
NEUTROS PCT: 40 %
Neutro Abs: 2.5 10*3/uL (ref 1.4–6.5)
Platelets: 216 10*3/uL (ref 150–440)
RBC: 4.53 MIL/uL (ref 3.80–5.20)
RDW: 17.3 % — ABNORMAL HIGH (ref 11.5–14.5)
WBC: 6.2 10*3/uL (ref 3.6–11.0)

## 2017-06-19 LAB — MAGNESIUM: MAGNESIUM: 1.9 mg/dL (ref 1.7–2.4)

## 2017-06-19 MED ORDER — PROCHLORPERAZINE EDISYLATE 10 MG/2ML IJ SOLN
5.0000 mg | Freq: Four times a day (QID) | INTRAMUSCULAR | Status: DC | PRN
  Administered 2017-06-19: 5 mg via INTRAVENOUS
  Filled 2017-06-19 (×2): qty 2

## 2017-06-19 MED ORDER — FAMOTIDINE IN NACL 20-0.9 MG/50ML-% IV SOLN
20.0000 mg | Freq: Once | INTRAVENOUS | Status: AC
  Administered 2017-06-19: 20 mg via INTRAVENOUS
  Filled 2017-06-19: qty 50

## 2017-06-19 MED ORDER — SODIUM CHLORIDE 0.9 % IV SOLN
Freq: Once | INTRAVENOUS | Status: AC
  Administered 2017-06-19: 12:00:00 via INTRAVENOUS
  Filled 2017-06-19: qty 1000

## 2017-06-19 NOTE — Progress Notes (Signed)
Patient here for symptom management. She report N/V for the past several days and an increase in "heart burn" pain. Diarrhea being controled by immodium.

## 2017-06-19 NOTE — Telephone Encounter (Signed)
Patient called reporting that she is vomiting and cannot keep her pills down and she is having bp problems. Asking to be seen today, she accepted appointment for 1045 yhis am

## 2017-06-19 NOTE — Progress Notes (Signed)
Symptom Management [place]  [phone] [phone]) [phone]  Patient Care Team: [name], MD as PCP - General (Family Medicine) Lloyd Huger, MD as Medical Oncologist (Oncology)   Name of the patient: Heidi Torres  [id number]  1955/12/10   Date of visit: 06/19/17  Diagnosis- Stage III clear cell renal cell carcinoma of the left kidney  Chief complaint/ Reason for visit- Diarrhea and Vomiting  Heme/Onc history:   No history exists.   Patient initially presented with incidental finding of 1.8 cm mass on her left kidney. Subsequent nephrectomy with Dr. [name] on 02/24/2017, revealed stage III clear cell renal cell carcinoma of th left kidney. Adjuvant chemotherapy with Sutent 50 mg daily for 4 weeks with 2 weeks off, total of 9 cycles/1 year of treatment was recommended. Plans to re-image every 3-4 months while on Sutent to assess for progression of disease. She could not tolerate sorafenib 50 mg secondary to diarrhea. Attempted dose reduction to 37.5 mg.    Interval history- Patient presents to [facility] for complaints of nausea and emesis which started around 11 pm and persisted overnight. Associated symptoms: diarrhea, nausea, acid reflux, fatigue, slow heart rate, hypertension. She describes symptoms as watery, undigested food, and emesis occurred several times. She has been controlling diarrhea with Imodium. She reports feeling 'badly' and thinks she may need IV fluids. She continues the lower dose of Sutent. She reports she has not had symptoms of emesis with the Sutent. The fatigue continues to be significantly impairing her qol but has improved with IV fluids and dose reduction.    ECOG FS:1 - Symptomatic but completely ambulatory  Review of systems- Review of Systems  Constitutional: Positive for malaise/fatigue. Negative for chills and fever.  HENT: Negative.   Eyes: Negative.   Respiratory: Negative.   Cardiovascular:  Negative.   Gastrointestinal: Positive for diarrhea, nausea and vomiting. Negative for blood in stool and melena.  Genitourinary: Negative.   Musculoskeletal: Negative.   Skin: Negative.   Neurological: Negative.   Endo/Heme/Allergies: Negative.   Psychiatric/Behavioral: The patient is nervous/anxious.      Current treatment- Sutent 37.5 mg  Allergies  Allergen Reactions  . Ciprofloxacin Other (See Comments)    Arms were numb  . Steri-Strip Compound Benzoin [Benzoin Compound] Other (See Comments)    Blisters     Past Medical History:  Diagnosis Date  . Asthma   . Complication of anesthesia   . GERD (gastroesophageal reflux disease)   . Headache    occular HA  . Heart murmur   . History of hiatal hernia   . Hypertension   . Irregular heart beat    pac's and pvc's  . Pneumonia    11-2016  . PONV (postoperative nausea and vomiting)   . Renal cell cancer, left [facility])      Past Surgical History:  Procedure Laterality Date  . ABDOMINAL HYSTERECTOMY    . ABLATION     prior to hysterectomy  . CORONARY ANGIOPLASTY    . CYSTOSCOPY/RETROGRADE/URETEROSCOPY     01-20-17 Dr. Junious Silk  . CYSTOSCOPY/RETROGRADE/URETEROSCOPY Left 01/20/2017   Procedure: CYSTOSCOPY/RETROGRADE/URETEROSCOPY/ BIOPSY,BLADDER BIOPSY PLACEMENT STENT LEFT URETER;  Surgeon: Festus Aloe, MD;  Location: [facility];  Service: Urology;  Laterality: Left;  . DILATION AND CURETTAGE OF UTERUS    . NASAL ENDOSCOPY WITH EPISTAXIS CONTROL    . ROBOTIC ASSITED PARTIAL NEPHRECTOMY Left 02/24/2017   Procedure: XI ROBOTIC ASSITED LEFT  RADICAL NEPHRECTOMY;  Surgeon: Alexis Frock, MD;  Location: [facility];  Service: Urology;  Laterality: Left;  . TUBAL LIGATION      Social History   Socioeconomic History  . Marital status: Married    Spouse name: Not on file  . Number of children: Not on file  . Years of education: Not on file  . Highest education level: Not on file  Occupational History  . Not on file  Social  Needs  . Financial resource strain: Not on file  . Food insecurity:    Worry: Not on file    Inability: Not on file  . Transportation needs:    Medical: Not on file    Non-medical: Not on file  Tobacco Use  . Smoking status: Never Smoker  . Smokeless tobacco: Never Used  Substance and Sexual Activity  . Alcohol use: No  . Drug use: No  . Sexual activity: Yes  Lifestyle  . Physical activity:    Days per week: Not on file    Minutes per session: Not on file  . Stress: Not on file  Relationships  . Social connections:    Talks on phone: Not on file    Gets together: Not on file    Attends religious service: Not on file    Active member of club or organization: Not on file    Attends meetings of clubs or organizations: Not on file    Relationship status: Not on file  . Intimate partner violence:    Fear of current or ex partner: Not on file    Emotionally abused: Not on file    Physically abused: Not on file    Forced sexual activity: Not on file  Other Topics Concern  . Not on file  Social History Narrative  . Not on file    Family History  Problem Relation Age of Onset  . Hypertension Brother      Current Outpatient Medications:  .  ADVAIR DISKUS 100-50 MCG/DOSE AEPB, Take 2 puffs by mouth See admin instructions. 2 puffs scheduled once in the morning, may repeat dose in the evening if wheezing., Disp: , Rfl:  .  ALPRAZolam (XANAX) 0.25 MG tablet, Take 0.25 mg by mouth at bedtime as needed for anxiety. Anxiety., Disp: , Rfl: 5 .  amLODipine (NORVASC) 5 MG tablet, Take 5 mg by mouth daily. , Disp: , Rfl:  .  aspirin EC 81 MG tablet, Take 81 mg by mouth daily., Disp: , Rfl:  .  Cholecalciferol (VITAMIN D3) 1000 units CAPS, Take 1 capsule by mouth daily., Disp: , Rfl:  .  estradiol (ESTRACE) 2 MG tablet, Take 2 mg by mouth daily., Disp: , Rfl: 6 .  lisinopril (PRINIVIL,ZESTRIL) 10 MG tablet, Take 10 mg by mouth daily., Disp: , Rfl:  .  Magnesium 250 MG TABS, Take 250 mg  by mouth daily. , Disp: , Rfl:  .  montelukast (SINGULAIR) 10 MG tablet, Take 10 mg by mouth daily., Disp: , Rfl:  .  pantoprazole (PROTONIX) 40 MG tablet, Take 40 mg by mouth 2 (two) times daily., Disp: , Rfl:  .  potassium chloride (MICRO-K) 10 MEQ CR capsule, Take 20 mEq by mouth daily., Disp: , Rfl: 11 .  PROAIR HFA 108 (90 BASE) MCG/ACT inhaler, Inhale 2 puffs into the lungs every 6 (six) hours as needed. For shortness of breath and wheezing., Disp: , Rfl:  .  ranitidine (ZANTAC) 75 MG tablet, Take 75 mg by mouth 2 (two) times daily., Disp: , Rfl:  .  sucralfate (  CARAFATE) 1 G tablet, Take 1 g by mouth 2 (two) times daily. , Disp: , Rfl:  .  SUNItinib (SUTENT) 37.5 MG capsule, Take 1 capsule (37.5 mg total) by mouth daily. Take for 28 days on, then 14 days off., Disp: 28 capsule, Rfl: 5 .  triamterene-hydrochlorothiazide (DYAZIDE) 37.5-25 MG per capsule, Take 1 capsule by mouth daily., Disp: , Rfl:   Physical exam:  Vitals:   06/19/17 1124 06/19/17 1131  BP:  133/75  Pulse:  (!) 54  Resp: 12   Temp:  97.6 F (36.4 C)  TempSrc:  Tympanic  Weight: 158 lb 9.6 oz (71.9 kg)   Height: 5\' 6"  (1.676 m)    General: Well-developed, well-nourished, no acute distress. Eyes: Pink conjunctiva, anicteric sclera. HEENT: oropharynx clear and moist. No ulcerations or evidence of stomatitis.  Lungs: Clear to auscultation bilaterally.  Heart: Regular rate and rhythm. No rubs, murmurs, or gallops. Abdomen: Soft, nontender, nondistended.  Musculoskeletal: No edema, cyanosis, or clubbing. Neuro: Alert, answering all questions appropriately. Cranial nerves grossly intact. Skin: No rashes or petechiae noted. Psych: Anxious   CMP Latest Ref Rng & Units 06/19/2017  Glucose 65 - 99 mg/dL 114(H)  BUN 6 - 20 mg/dL 22(H)  Creatinine 0.44 - 1.00 mg/dL 1.41(H)  Sodium 135 - 145 mmol/L 137  Potassium 3.5 - 5.1 mmol/L 3.8  Chloride 101 - 111 mmol/L 102  CO2 22 - 32 mmol/L 26  Calcium 8.9 - 10.3 mg/dL 9.4    Total Protein 6.5 - 8.1 g/dL 6.7  Total Bilirubin 0.3 - 1.2 mg/dL 0.7  Alkaline Phos 38 - 126 U/L 58  AST 15 - 41 U/L 39  ALT 14 - 54 U/L 28   CBC Latest Ref Rng & Units 06/19/2017  WBC 3.6 - 11.0 K/uL 6.2  Hemoglobin 12.0 - 16.0 g/dL 13.7  Hematocrit 35.0 - 47.0 % 39.0  Platelets 150 - 440 K/uL 216    No images are attached to the encounter.  No results found.   Assessment and plan- Patient is a 62 y.o. female with stage III clear cell renal cell carcinoma of left kidney s/p nephrectomy 02/24/17.   1. Cancer of left kidney- stage III- currently on Sutent. Will order follow up imaging today. Patient scheduled to follow-up with medical oncology on 06/27/17.   2. Vomiting- acute; unclear if r/t sutent vs infectious; no emesis in clinic and improving nausea. In setting of concern for qt prolongation, will give compazine and encouraged to continue home medications. She takes Xanax for anxiety but will also benefit from anti-emetic benefit.   3. Diarrhea- likely r/t Sutent; chronic, no worse today but likely contributory to AKI and dehydration. Will give IV fluids in clinic today. Encouraged continued use of Imodium as diarrhea has been controlling symptoms.   4. Dyspepsia- On Protonix 40 mg PO BID, Ranitidine 75 mg PO BID, and Sucralfate 1 g PO BID with persistent symptoms. Likely r/t Sutent. Could consider referral to GI for upper endoscopy for persistent symptoms or symptoms present after holding Sutent.   5. Acute Renal Insufficiency- Cr 1.41, BUN 22 today. UA negative for proteinurea. Worsening kidney function may be related to acute dehydration vs Sutent. Follow up with Dr. Grayland Ormond in 1 week for repeat labs to re-evaluate after IV fluids today.   6. Thyroid Dysfunction- Last TSH 2.210 (03/27/17). TSH today- 11.986, significant increase since March. Likely contributing to her significant fatigue and suspect r/t to Sutent. Discussed finding with Dr. Grayland Ormond who recommends holding Sutent  and he will repeat TSH. Could consider continuing Sutent and starting levothyroxine.   7. Bradycardia- Decreased heart rate but persistently hypertensive. Has been adjusting medications with PCP guidance. May be related to Sutent. History of murmur and a fib. Echo with Dr. Clayborn Bigness 10/2016 showed EF > 55%. Could consider repeating echo in setting of Sutent. Will defer further management to Dr. Grayland Ormond.   Patient advised to notify the clinic if there is no improvement in symptoms or if symptoms worsen in next 3-4 days. Otherwise, follow up with Dr. Grayland Ormond as scheduled.     Visit Diagnosis 1. Cancer of left kidney ([place])   2. Chemotherapy induced nausea and vomiting   3. Chemotherapy induced diarrhea   4. Dyspepsia   5. Acute renal insufficiency   6. Thyroid dysfunction   7. Bradycardia     Patient expressed understanding and was in agreement with this plan. She also understands that She can call clinic at any time with any questions, concerns, or complaints.   Thank you for allowing me to participate in the care of this very pleasant patient. Please feel free to contact me if any questions or concerns.   Beckey Rutter, DNP, AGNP-C [place] at [facility] [phone] (work cell) [phone] (office)

## 2017-06-20 ENCOUNTER — Telehealth: Payer: Self-pay | Admitting: *Deleted

## 2017-06-20 LAB — URINALYSIS, COMPLETE (UACMP) WITH MICROSCOPIC
Bilirubin Urine: NEGATIVE
Glucose, UA: NEGATIVE mg/dL
HGB URINE DIPSTICK: NEGATIVE
Ketones, ur: NEGATIVE mg/dL
Leukocytes, UA: NEGATIVE
NITRITE: NEGATIVE
PH: 6 (ref 5.0–8.0)
Protein, ur: NEGATIVE mg/dL
SPECIFIC GRAVITY, URINE: 1.005 (ref 1.005–1.030)
Squamous Epithelial / LPF: NONE SEEN (ref 0–5)

## 2017-06-20 NOTE — Telephone Encounter (Signed)
Patient called to inform us that she is feeling much better this morning and was able to eat last night and the morning and wants to say how grateful she is for what we did. Not sure if this is going to need to be done again or not.

## 2017-06-20 NOTE — Telephone Encounter (Signed)
Called patient left message regarding her clinical status today after treatment yesterday. Advised patient to call clinic if there was no improvement;.

## 2017-06-21 ENCOUNTER — Telehealth: Payer: Self-pay | Admitting: *Deleted

## 2017-06-21 ENCOUNTER — Other Ambulatory Visit: Payer: Self-pay | Admitting: *Deleted

## 2017-06-21 NOTE — Telephone Encounter (Signed)
Called patient per NP request to let her know that we talked to [facility] in [place] and he said the oral contrast should not affect her kidney function and that Dr. Grayland Ormond said it was ok to take the oral contrast. Patient was still concerned, and said she would call her nephrologist for a second opinion.  Patient called triage to say that her nephrologist did not want her to take oral contrast for upcoming CT Scans.  Returned call to patient and left VM message that she does not have to take the oral contrast, and that if she already picked it up, then she can just return it to radiology at the time of her scans.

## 2017-06-23 ENCOUNTER — Ambulatory Visit
Admission: RE | Admit: 2017-06-23 | Discharge: 2017-06-23 | Disposition: A | Discharge status: Home / Self Care | Payer: Commercial Managed Care - PPO | Source: Ambulatory Visit | Attending: Nurse Practitioner | Admitting: Nurse Practitioner

## 2017-06-23 ENCOUNTER — Other Ambulatory Visit: Payer: Self-pay | Admitting: Nurse Practitioner

## 2017-06-23 DIAGNOSIS — R933 Abnormal findings on diagnostic imaging of other parts of digestive tract: Secondary | ICD-10-CM | POA: Insufficient documentation

## 2017-06-23 DIAGNOSIS — Z905 Acquired absence of kidney: Secondary | ICD-10-CM | POA: Insufficient documentation

## 2017-06-23 DIAGNOSIS — C642 Malignant neoplasm of left kidney, except renal pelvis: Secondary | ICD-10-CM | POA: Diagnosis present

## 2017-06-23 DIAGNOSIS — I880 Nonspecific mesenteric lymphadenitis: Secondary | ICD-10-CM | POA: Diagnosis not present

## 2017-06-23 NOTE — Progress Notes (Signed)
Called patient to discuss results of TSH and Dr. Gary Fleet recommendation. Per Dr. Grayland Ormond, recommendation is to hold Sutent for one week and to follow up with Thyroid Panel and TSH on 06/27/17 when patient will rtc for follow-up and discussion of imaging. Orders placed. Patient thanked for call and several questions answered.

## 2017-06-26 NOTE — Progress Notes (Signed)
[place]  [phone] [phone]  ID: Heidi Torres OB: Feb 03, 1955  MR#: [id number]  HCW#:[id number]  Patient Care Team: [name], MD as PCP - General (Family Medicine) Lloyd Huger, MD as Medical Oncologist (Oncology)  CHIEF COMPLAINT: Stage III clear cell renal cell carcinoma of the left kidney  INTERVAL HISTORY: Patient returns to clinic today for further evaluation, repeat laboratory work, discussion of her imaging results, and further evaluation of whether or not to continue adjuvant sorafenib.  She was recently seen in symptom management clinic with increased diarrhea and vomiting, but now her symptoms have resolved.  She feels well and is nearly back to her baseline. She continues to be anxious.  She has no neurologic complaints.  She has a fair appetite, but denies weight loss.  She has no chest pain or shortness of breath.  She denies any nausea, vomiting, or constipation.  Patient feels like she might have a UTI.  She offers no further specific complaints today.   REVIEW OF SYSTEMS:   Review of Systems  Constitutional: Negative.  Negative for fever, malaise/fatigue and weight loss.  Respiratory: Negative.  Negative for cough and shortness of breath.   Cardiovascular: Negative.  Negative for chest pain and leg swelling.  Gastrointestinal: Negative.  Negative for abdominal pain, blood in stool, diarrhea and melena.  Genitourinary: Positive for dysuria. Negative for flank pain and hematuria.  Musculoskeletal: Negative.  Negative for back pain.  Skin: Negative.  Negative for rash.  Neurological: Negative.  Negative for sensory change, focal weakness and weakness.  Psychiatric/Behavioral: The patient is nervous/anxious.     As per HPI. Otherwise, a complete review of systems is negative.  PAST MEDICAL HISTORY: Past Medical History:  Diagnosis Date  . Asthma   . Complication of anesthesia   . GERD (gastroesophageal  reflux disease)   . Headache    occular HA  . Heart murmur   . History of hiatal hernia   . Hypertension   . Irregular heart beat    pac's and pvc's  . Pneumonia    11-2016  . PONV (postoperative nausea and vomiting)   . Renal cell cancer, left ([facility])     PAST SURGICAL HISTORY: Past Surgical History:  Procedure Laterality Date  . ABDOMINAL HYSTERECTOMY    . ABLATION     prior to hysterectomy  . CORONARY ANGIOPLASTY    . CYSTOSCOPY/RETROGRADE/URETEROSCOPY     01-20-17 Dr. Junious Silk  . CYSTOSCOPY/RETROGRADE/URETEROSCOPY Left 01/20/2017   Procedure: CYSTOSCOPY/RETROGRADE/URETEROSCOPY/ BIOPSY,BLADDER BIOPSY PLACEMENT STENT LEFT URETER;  Surgeon: Festus Aloe, MD;  Location: [facility];  Service: Urology;  Laterality: Left;  . DILATION AND CURETTAGE OF UTERUS    . NASAL ENDOSCOPY WITH EPISTAXIS CONTROL    . ROBOTIC ASSITED PARTIAL NEPHRECTOMY Left 02/24/2017   Procedure: XI ROBOTIC ASSITED LEFT  RADICAL NEPHRECTOMY;  Surgeon: Alexis Frock, MD;  Location: [facility];  Service: Urology;  Laterality: Left;  . TUBAL LIGATION      FAMILY HISTORY: Family History  Problem Relation Age of Onset  . Hypertension Brother     ADVANCED DIRECTIVES (Y/N):  N  HEALTH MAINTENANCE: Social History   Tobacco Use  . Smoking status: Never Smoker  . Smokeless tobacco: Never Used  Substance Use Topics  . Alcohol use: No  . Drug use: No     Colonoscopy:  PAP:  Bone density:  Lipid panel:  Allergies  Allergen Reactions  . Ciprofloxacin Other (See Comments)    Arms were numb  .  Steri-Strip Compound Benzoin [Benzoin Compound] Other (See Comments)    Blisters    Current Outpatient Medications  Medication Sig Dispense Refill  . ADVAIR DISKUS 100-50 MCG/DOSE AEPB Take 2 puffs by mouth See admin instructions. 2 puffs scheduled once in the morning, may repeat dose in the evening if wheezing.    Marland Kitchen ALPRAZolam (XANAX) 0.25 MG tablet Take 0.25 mg by mouth at bedtime as needed for anxiety.  Anxiety.  5  . amLODipine (NORVASC) 5 MG tablet Take 10 mg by mouth daily.     Marland Kitchen aspirin EC 81 MG tablet Take 81 mg by mouth daily.    . Cholecalciferol (VITAMIN D3) 1000 units CAPS Take 1 capsule by mouth daily.    Marland Kitchen estradiol (ESTRACE) 2 MG tablet Take 2 mg by mouth daily.  6  . lisinopril (PRINIVIL,ZESTRIL) 10 MG tablet Take 10 mg by mouth daily.    . montelukast (SINGULAIR) 10 MG tablet Take 10 mg by mouth daily.    . pantoprazole (PROTONIX) 40 MG tablet Take 40 mg by mouth 2 (two) times daily.    . potassium chloride (MICRO-K) 10 MEQ CR capsule Take 20 mEq by mouth daily.  11  . PROAIR HFA 108 (90 BASE) MCG/ACT inhaler Inhale 2 puffs into the lungs every 6 (six) hours as needed. For shortness of breath and wheezing.    . ranitidine (ZANTAC) 75 MG tablet Take 75 mg by mouth 2 (two) times daily.    . SUNItinib (SUTENT) 37.5 MG capsule Take 1 capsule (37.5 mg total) by mouth daily. Take for 28 days on, then 14 days off. 28 capsule 5  . triamterene-hydrochlorothiazide (DYAZIDE) 37.5-25 MG per capsule Take 1 capsule by mouth daily.    . Magnesium 250 MG TABS Take 250 mg by mouth daily.     . sucralfate (CARAFATE) 1 G tablet Take 1 g by mouth 2 (two) times daily.      No current facility-administered medications for this visit.     OBJECTIVE: Vitals:   06/27/17 1451  BP: 124/90  Pulse: 81  Resp: 18  Temp: 97.9 F (36.6 C)     Body mass index is 26.38 kg/m.    ECOG FS:0 - Asymptomatic  General: Well-developed, well-nourished, no acute distress. Eyes: Pink conjunctiva, anicteric sclera. HEENT: Normocephalic, moist mucous membranes, clear oropharnyx. Lungs: Clear to auscultation bilaterally. Heart: Regular rate and rhythm. No rubs, murmurs, or gallops. Abdomen: Soft, nontender, nondistended. No organomegaly noted, normoactive bowel sounds. Musculoskeletal: No edema, cyanosis, or clubbing. Neuro: Alert, answering all questions appropriately. Cranial nerves grossly intact. Skin: No  rashes or petechiae noted. Psych: Normal affect.  LAB RESULTS:  Lab Results  Component Value Date   NA 133 (L) 06/27/2017   K 3.8 06/27/2017   CL 100 06/27/2017   CO2 24 06/27/2017   GLUCOSE 151 (H) 06/27/2017   BUN 22 06/27/2017   CREATININE 1.24 (H) 06/27/2017   CALCIUM 9.1 06/27/2017   PROT 6.5 06/27/2017   ALBUMIN 3.8 06/27/2017   AST 35 06/27/2017   ALT 28 06/27/2017   ALKPHOS 53 06/27/2017   BILITOT 0.4 06/27/2017   GFRNONAA 46 (L) 06/27/2017   GFRAA 53 (L) 06/27/2017    Lab Results  Component Value Date   WBC 5.8 06/27/2017   NEUTROABS 3.0 06/27/2017   HGB 11.6 (L) 06/27/2017   HCT 33.1 (L) 06/27/2017   MCV 89.4 06/27/2017   PLT 167 06/27/2017     STUDIES: Ct Abdomen Pelvis Wo Contrast  Result Date: 06/23/2017  CLINICAL DATA:  Nausea, vomiting, diarrhea, dyspepsia, fatigue and elevated creatinine. History of stage III kidney cancer being treated with chemotherapy. EXAM: CT CHEST, ABDOMEN AND PELVIS WITHOUT CONTRAST TECHNIQUE: Multidetector CT imaging of the chest, abdomen and pelvis was performed following the standard protocol without IV contrast. COMPARISON:  CT abdomen dated 03/31/2017. MRI abdomen dated 12/29/2016. FINDINGS: CT CHEST FINDINGS Cardiovascular: Heart size is normal. Small pericardial effusion versus pericardial wall thickening, anteriorly and RIGHT heart base, stable. No thoracic aortic aneurysm. Mediastinum/Nodes: No mass or enlarged lymph nodes seen within the mediastinum or perihilar regions. Esophagus appears normal. Trachea and central bronchi are unremarkable. Lungs/Pleura: Lungs are clear.  No pleural effusion. Musculoskeletal: No acute or suspicious osseous finding. CT ABDOMEN PELVIS FINDINGS Hepatobiliary: No focal liver abnormality is seen. No gallstones, gallbladder wall thickening, or biliary dilatation. Pancreas: Unremarkable. No pancreatic ductal dilatation or surrounding inflammatory changes. Spleen: Normal in size without focal  abnormality. Adrenals/Urinary Tract: Status post LEFT nephrectomy. Expected postsurgical changes. RIGHT kidney appears normal without mass, stone or hydronephrosis. No ureteral or bladder calculi identified. Bladder is decompressed. Stomach/Bowel: Thickening of the walls of the proximal small bowel, distal duodenum and/or proximal jejunum, near the nephrectomy site, possibly reactive. No dilated large or small bowel loops. No other site of bowel wall thickening or evidence of acute bowel wall inflammation. Moderate amount of stool within the colon. Appendix is normal. Stomach is unremarkable. Vascular/Lymphatic: No significant vascular findings are present. No enlarged abdominal or pelvic lymph nodes. Reproductive: No adnexal mass or free fluid. Other: Trace free fluid in the lower pelvis. Hazy density within the central abdominal mesentery, with associated small lymph nodes, suggesting mild mesenteric adenitis. Musculoskeletal: No acute or suspicious osseous finding. Disc desiccation at the lumbosacral junction, mild to moderate in degree. IMPRESSION: 1. Thickening of the walls of a focal segment of the small bowel in the LEFT upper quadrant, distal duodenum and/or proximal jejunum, suggesting small bowel enteritis of infectious or inflammatory nature. This is in the vicinity of the patient's recent nephrectomy and may be reactive in nature. 2. New haziness within the central mesentery, with associated small lymph nodes, indicating mild mesenteric adenitis, likely related to the small bowel findings described above. 3. Status post LEFT nephrectomy. Expected postsurgical changes. The fluid seen at the nephrectomy site on earlier CT of 03/31/2017 has resolved. No evidence of residual malignancy. 4. Trace ascites within the lower pelvis. 5. Chest CT is unremarkable.  Lungs are now clear. Electronically Signed   By: Franki Cabot M.D.   On: 06/23/2017 10:25   Ct Chest Wo Contrast  Result Date: 06/23/2017 CLINICAL  DATA:  Nausea, vomiting, diarrhea, dyspepsia, fatigue and elevated creatinine. History of stage III kidney cancer being treated with chemotherapy. EXAM: CT CHEST, ABDOMEN AND PELVIS WITHOUT CONTRAST TECHNIQUE: Multidetector CT imaging of the chest, abdomen and pelvis was performed following the standard protocol without IV contrast. COMPARISON:  CT abdomen dated 03/31/2017. MRI abdomen dated 12/29/2016. FINDINGS: CT CHEST FINDINGS Cardiovascular: Heart size is normal. Small pericardial effusion versus pericardial wall thickening, anteriorly and RIGHT heart base, stable. No thoracic aortic aneurysm. Mediastinum/Nodes: No mass or enlarged lymph nodes seen within the mediastinum or perihilar regions. Esophagus appears normal. Trachea and central bronchi are unremarkable. Lungs/Pleura: Lungs are clear.  No pleural effusion. Musculoskeletal: No acute or suspicious osseous finding. CT ABDOMEN PELVIS FINDINGS Hepatobiliary: No focal liver abnormality is seen. No gallstones, gallbladder wall thickening, or biliary dilatation. Pancreas: Unremarkable. No pancreatic ductal dilatation or surrounding inflammatory changes. Spleen: Normal  in size without focal abnormality. Adrenals/Urinary Tract: Status post LEFT nephrectomy. Expected postsurgical changes. RIGHT kidney appears normal without mass, stone or hydronephrosis. No ureteral or bladder calculi identified. Bladder is decompressed. Stomach/Bowel: Thickening of the walls of the proximal small bowel, distal duodenum and/or proximal jejunum, near the nephrectomy site, possibly reactive. No dilated large or small bowel loops. No other site of bowel wall thickening or evidence of acute bowel wall inflammation. Moderate amount of stool within the colon. Appendix is normal. Stomach is unremarkable. Vascular/Lymphatic: No significant vascular findings are present. No enlarged abdominal or pelvic lymph nodes. Reproductive: No adnexal mass or free fluid. Other: Trace free fluid in  the lower pelvis. Hazy density within the central abdominal mesentery, with associated small lymph nodes, suggesting mild mesenteric adenitis. Musculoskeletal: No acute or suspicious osseous finding. Disc desiccation at the lumbosacral junction, mild to moderate in degree. IMPRESSION: 1. Thickening of the walls of a focal segment of the small bowel in the LEFT upper quadrant, distal duodenum and/or proximal jejunum, suggesting small bowel enteritis of infectious or inflammatory nature. This is in the vicinity of the patient's recent nephrectomy and may be reactive in nature. 2. New haziness within the central mesentery, with associated small lymph nodes, indicating mild mesenteric adenitis, likely related to the small bowel findings described above. 3. Status post LEFT nephrectomy. Expected postsurgical changes. The fluid seen at the nephrectomy site on earlier CT of 03/31/2017 has resolved. No evidence of residual malignancy. 4. Trace ascites within the lower pelvis. 5. Chest CT is unremarkable.  Lungs are now clear. Electronically Signed   By: Franki Cabot M.D.   On: 06/23/2017 10:25    ASSESSMENT: Stage III clear cell renal cell carcinoma of the left kidney  PLAN:    1. Stage III clear cell renal cell carcinoma of the left kidney: Patient's nephrectomy was on February 24, 2017.  Patient could not tolerate 50 mg or a dose reduction of 37.5 mg of sorafenib secondary to worsening diarrhea, vomiting, and abnormal thyroid panel.  Because of this, treatment was discontinued altogether.  Restaging CT scan completed on June 23, 2017 reviewed independently and report as above with no evidence of progressive or metastatic disease.  No further intervention is needed at this time.  Return to clinic in 3 months with repeat laboratory work and further evaluation. 2.  Elevated TSH: Significantly improved, but not yet back to normal, after discontinuing sorafenib.  Monitor. 3.  Diarrhea: Resolved.  Secondary to  sorafenib.  Treatment discontinued as above. 4.  Renal insufficiency: Likely secondary to diarrhea and dehydration.  Creatinine is now improving. 5.  Anemia: Mild, monitor. 5.  Hypokalemia: Resolved.    Patient expressed understanding and was in agreement with this plan. She also understands that She can call clinic at any time with any questions, concerns, or complaints.   Cancer Staging Cancer of left kidney [facility]) Staging form: Kidney, AJCC 8th Edition - Clinical stage from 03/12/2017: Stage III (cT3a, cN0, cM0) - Signed by Lloyd Huger, MD on 03/12/2017   Lloyd Huger, MD   06/30/2017 12:34 PM

## 2017-06-27 ENCOUNTER — Inpatient Hospital Stay (HOSPITAL_BASED_OUTPATIENT_CLINIC_OR_DEPARTMENT_OTHER): Payer: Commercial Managed Care - PPO | Admitting: Oncology

## 2017-06-27 ENCOUNTER — Inpatient Hospital Stay: Payer: Commercial Managed Care - PPO

## 2017-06-27 ENCOUNTER — Other Ambulatory Visit: Payer: Self-pay

## 2017-06-27 VITALS — BP 124/90 | HR 81 | Temp 97.9°F | Resp 18 | Wt 163.4 lb

## 2017-06-27 DIAGNOSIS — R197 Diarrhea, unspecified: Secondary | ICD-10-CM

## 2017-06-27 DIAGNOSIS — K219 Gastro-esophageal reflux disease without esophagitis: Secondary | ICD-10-CM

## 2017-06-27 DIAGNOSIS — I10 Essential (primary) hypertension: Secondary | ICD-10-CM

## 2017-06-27 DIAGNOSIS — I4891 Unspecified atrial fibrillation: Secondary | ICD-10-CM

## 2017-06-27 DIAGNOSIS — R399 Unspecified symptoms and signs involving the genitourinary system: Secondary | ICD-10-CM

## 2017-06-27 DIAGNOSIS — C642 Malignant neoplasm of left kidney, except renal pelvis: Secondary | ICD-10-CM

## 2017-06-27 DIAGNOSIS — R112 Nausea with vomiting, unspecified: Secondary | ICD-10-CM

## 2017-06-27 DIAGNOSIS — N289 Disorder of kidney and ureter, unspecified: Secondary | ICD-10-CM

## 2017-06-27 DIAGNOSIS — R001 Bradycardia, unspecified: Secondary | ICD-10-CM

## 2017-06-27 DIAGNOSIS — E079 Disorder of thyroid, unspecified: Secondary | ICD-10-CM

## 2017-06-27 LAB — PHOSPHORUS: Phosphorus: 2.8 mg/dL (ref 2.5–4.6)

## 2017-06-27 LAB — COMPREHENSIVE METABOLIC PANEL
ALBUMIN: 3.8 g/dL (ref 3.5–5.0)
ALK PHOS: 53 U/L (ref 38–126)
ALT: 28 U/L (ref 0–44)
AST: 35 U/L (ref 15–41)
Anion gap: 9 (ref 5–15)
BILIRUBIN TOTAL: 0.4 mg/dL (ref 0.3–1.2)
BUN: 22 mg/dL (ref 8–23)
CALCIUM: 9.1 mg/dL (ref 8.9–10.3)
CO2: 24 mmol/L (ref 22–32)
Chloride: 100 mmol/L (ref 98–111)
Creatinine, Ser: 1.24 mg/dL — ABNORMAL HIGH (ref 0.44–1.00)
GFR calc Af Amer: 53 mL/min — ABNORMAL LOW (ref >60)
GFR calc non Af Amer: 46 mL/min — ABNORMAL LOW (ref >60)
GLUCOSE: 151 mg/dL — AB (ref 70–99)
Potassium: 3.8 mmol/L (ref 3.5–5.1)
Sodium: 133 mmol/L — ABNORMAL LOW (ref 135–145)
TOTAL PROTEIN: 6.5 g/dL (ref 6.5–8.1)

## 2017-06-27 LAB — CBC WITH DIFFERENTIAL/PLATELET
Basophils Absolute: 0 10*3/uL (ref 0–0.1)
Basophils Relative: 0 %
Eosinophils Absolute: 0.9 10*3/uL — ABNORMAL HIGH (ref 0–0.7)
Eosinophils Relative: 16 %
HCT: 33.1 % — ABNORMAL LOW (ref 35.0–47.0)
Hemoglobin: 11.6 g/dL — ABNORMAL LOW (ref 12.0–16.0)
Lymphocytes Relative: 28 %
Lymphs Abs: 1.6 10*3/uL (ref 1.0–3.6)
MCH: 31.2 pg (ref 26.0–34.0)
MCHC: 34.9 g/dL (ref 32.0–36.0)
MCV: 89.4 fL (ref 80.0–100.0)
Monocytes Absolute: 0.3 10*3/uL (ref 0.2–0.9)
Monocytes Relative: 5 %
Neutro Abs: 3 10*3/uL (ref 1.4–6.5)
Neutrophils Relative %: 51 %
Platelets: 167 10*3/uL (ref 150–440)
RBC: 3.71 MIL/uL — ABNORMAL LOW (ref 3.80–5.20)
RDW: 18.1 % — ABNORMAL HIGH (ref 11.5–14.5)
WBC: 5.8 10*3/uL (ref 3.6–11.0)

## 2017-06-27 LAB — MAGNESIUM: Magnesium: 2 mg/dL (ref 1.7–2.4)

## 2017-06-27 LAB — URINALYSIS, COMPLETE (UACMP) WITH MICROSCOPIC
Bacteria, UA: NONE SEEN
Bilirubin Urine: NEGATIVE
Glucose, UA: NEGATIVE mg/dL
Hgb urine dipstick: NEGATIVE
Ketones, ur: NEGATIVE mg/dL
Leukocytes, UA: NEGATIVE
Nitrite: NEGATIVE
Protein, ur: NEGATIVE mg/dL
Specific Gravity, Urine: 1.002 — ABNORMAL LOW (ref 1.005–1.030)
pH: 7 (ref 5.0–8.0)

## 2017-06-27 NOTE — Progress Notes (Signed)
Pt in for follow up, denies any concerns today. 

## 2017-06-28 LAB — THYROID PANEL WITH TSH
Free Thyroxine Index: 1.7 (ref 1.2–4.9)
T3 UPTAKE RATIO: 21 % — AB (ref 24–39)
T4 TOTAL: 8.3 ug/dL (ref 4.5–12.0)
TSH: 4.74 u[IU]/mL — ABNORMAL HIGH (ref 0.450–4.500)

## 2017-06-29 ENCOUNTER — Telehealth: Payer: Self-pay | Admitting: *Deleted

## 2017-06-29 LAB — URINE CULTURE

## 2017-06-29 NOTE — Telephone Encounter (Signed)
No uti.  No treatment needed.

## 2017-06-29 NOTE — Telephone Encounter (Signed)
Patient asking for results of Urine test  Dx:  UTI symptoms  Specimen Information: Urine, Clean Catch     Component 2d ago  Specimen Description URINE, CLEAN CATCH  Performed at [facility], [address] [place], [place] [address]     Special Requests NONE  Performed at [facility], [address] [place], [place] [address]     Culture [place], SUGGEST RECOLLECTIONAbnormal    Report Status 06/29/2017 FINAL   Resulting Agency [facility]      Specimen Collected: 06/27/17 16:20       Dx:  UTI symptoms   Ref Range & Units 2d ago  Color, Urine YELLOW COLORLESSAbnormal    APPearance CLEAR CLEARAbnormal    Specific Gravity, Urine 1.005 - 1.030 1.002Low    pH 5.0 - 8.0 7.0   Glucose, UA NEGATIVE mg/dL NEGATIVE   Hgb urine dipstick NEGATIVE NEGATIVE   Bilirubin Urine NEGATIVE NEGATIVE   Ketones, ur NEGATIVE mg/dL NEGATIVE   Protein, ur NEGATIVE mg/dL NEGATIVE   Nitrite NEGATIVE NEGATIVE   Leukocytes, UA NEGATIVE NEGATIVE   WBC, UA 0 - 5 WBC/hpf 0-5   Bacteria, UA NONE SEEN NONE SEEN   Squamous Epithelial / LPF 0 - 5 0-5   Comment: Performed at [facility], [address] [place], [place] [address]  Resulting Agency  [facility]      Specimen Collected: 06/27/17 16:20

## 2017-06-29 NOTE — Telephone Encounter (Signed)
Call returned to patient and advised no UTI and no treatment needed. I had to leave message on voice mail

## 2017-07-01 DIAGNOSIS — K521 Toxic gastroenteritis and colitis: Secondary | ICD-10-CM | POA: Insufficient documentation

## 2017-07-01 DIAGNOSIS — R112 Nausea with vomiting, unspecified: Secondary | ICD-10-CM | POA: Insufficient documentation

## 2017-07-01 DIAGNOSIS — T451X5A Adverse effect of antineoplastic and immunosuppressive drugs, initial encounter: Secondary | ICD-10-CM

## 2017-07-01 DIAGNOSIS — E079 Disorder of thyroid, unspecified: Secondary | ICD-10-CM | POA: Insufficient documentation

## 2017-07-01 DIAGNOSIS — N289 Disorder of kidney and ureter, unspecified: Secondary | ICD-10-CM | POA: Insufficient documentation

## 2017-07-03 ENCOUNTER — Encounter: Payer: Self-pay | Admitting: Nurse Practitioner

## 2017-07-04 ENCOUNTER — Other Ambulatory Visit: Payer: Commercial Managed Care - PPO

## 2017-07-04 ENCOUNTER — Ambulatory Visit: Payer: Commercial Managed Care - PPO | Admitting: Oncology

## 2017-07-20 ENCOUNTER — Encounter: Payer: Self-pay | Admitting: Nurse Practitioner

## 2017-08-07 ENCOUNTER — Other Ambulatory Visit: Payer: Self-pay | Admitting: Nurse Practitioner

## 2017-08-07 ENCOUNTER — Telehealth: Payer: Self-pay | Admitting: *Deleted

## 2017-08-07 ENCOUNTER — Inpatient Hospital Stay: Payer: Commercial Managed Care - PPO | Attending: Oncology

## 2017-08-07 DIAGNOSIS — C642 Malignant neoplasm of left kidney, except renal pelvis: Secondary | ICD-10-CM

## 2017-08-07 LAB — CBC WITH DIFFERENTIAL/PLATELET
Basophils Absolute: 0 10*3/uL (ref 0–0.1)
Basophils Relative: 1 %
EOS PCT: 14 %
Eosinophils Absolute: 1.2 10*3/uL — ABNORMAL HIGH (ref 0–0.7)
HCT: 31.9 % — ABNORMAL LOW (ref 35.0–47.0)
Hemoglobin: 11.2 g/dL — ABNORMAL LOW (ref 12.0–16.0)
LYMPHS ABS: 2.2 10*3/uL (ref 1.0–3.6)
LYMPHS PCT: 26 %
MCH: 32.5 pg (ref 26.0–34.0)
MCHC: 35.2 g/dL (ref 32.0–36.0)
MCV: 92.2 fL (ref 80.0–100.0)
MONO ABS: 0.5 10*3/uL (ref 0.2–0.9)
MONOS PCT: 7 %
Neutro Abs: 4.4 10*3/uL (ref 1.4–6.5)
Neutrophils Relative %: 52 %
PLATELETS: 293 10*3/uL (ref 150–440)
RBC: 3.46 MIL/uL — AB (ref 3.80–5.20)
RDW: 14.9 % — ABNORMAL HIGH (ref 11.5–14.5)
WBC: 8.3 10*3/uL (ref 3.6–11.0)

## 2017-08-07 LAB — COMPREHENSIVE METABOLIC PANEL
ALBUMIN: 4.1 g/dL (ref 3.5–5.0)
ALT: 15 U/L (ref 0–44)
AST: 22 U/L (ref 15–41)
Alkaline Phosphatase: 50 U/L (ref 38–126)
Anion gap: 10 (ref 5–15)
BUN: 24 mg/dL — AB (ref 8–23)
CHLORIDE: 100 mmol/L (ref 98–111)
CO2: 23 mmol/L (ref 22–32)
CREATININE: 1.21 mg/dL — AB (ref 0.44–1.00)
Calcium: 9.2 mg/dL (ref 8.9–10.3)
GFR calc Af Amer: 54 mL/min — ABNORMAL LOW (ref >60)
GFR calc non Af Amer: 47 mL/min — ABNORMAL LOW (ref >60)
GLUCOSE: 124 mg/dL — AB (ref 70–99)
POTASSIUM: 3.7 mmol/L (ref 3.5–5.1)
Sodium: 133 mmol/L — ABNORMAL LOW (ref 135–145)
Total Bilirubin: 0.5 mg/dL (ref 0.3–1.2)
Total Protein: 7.2 g/dL (ref 6.5–8.1)

## 2017-08-07 NOTE — Telephone Encounter (Signed)
Patient called requesting a full blood work up stating she feels very bad, she wants to be sure we also check her thyroid in the labs requested. Per Beckey Rutter, NP, ok fr labs and offer her appointment with [facility] this week or Dr Grayland Ormond next week. Patient will come this afternoon for labs and states depending on results, she will probably wait until next week to see Dr Grayland Ormond

## 2017-08-08 LAB — THYROID PANEL WITH TSH
FREE THYROXINE INDEX: 2.3 (ref 1.2–4.9)
T3 Uptake Ratio: 27 % (ref 24–39)
T4 TOTAL: 8.4 ug/dL (ref 4.5–12.0)
TSH: 1.57 u[IU]/mL (ref 0.450–4.500)

## 2017-08-08 LAB — PARATHYROID HORMONE, INTACT (NO CA): PTH: 58 pg/mL (ref 15–65)

## 2017-08-14 ENCOUNTER — Ambulatory Visit: Payer: Commercial Managed Care - PPO | Admitting: Oncology

## 2017-08-14 NOTE — Progress Notes (Deleted)
[place]  [phone] [phone]  ID: [name] OB: 19-Sep-1955  MR#: [id number]  ZES#:[id number]  Patient Care Team: [name], MD as PCP - General (Family Medicine) Lloyd Huger, MD as Medical Oncologist (Oncology)  CHIEF COMPLAINT: Stage III clear cell renal cell carcinoma of the left kidney  INTERVAL HISTORY: Patient returns to clinic today for further evaluation, repeat laboratory work, discussion of her imaging results, and further evaluation of whether or not to continue adjuvant sorafenib.  She was recently seen in symptom management clinic with increased diarrhea and vomiting, but now her symptoms have resolved.  She feels well and is nearly back to her baseline. She continues to be anxious.  She has no neurologic complaints.  She has a fair appetite, but denies weight loss.  She has no chest pain or shortness of breath.  She denies any nausea, vomiting, or constipation.  Patient feels like she might have a UTI.  She offers no further specific complaints today.   REVIEW OF SYSTEMS:   Review of Systems  Constitutional: Negative.  Negative for fever, malaise/fatigue and weight loss.  Respiratory: Negative.  Negative for cough and shortness of breath.   Cardiovascular: Negative.  Negative for chest pain and leg swelling.  Gastrointestinal: Negative.  Negative for abdominal pain, blood in stool, diarrhea and melena.  Genitourinary: Positive for dysuria. Negative for flank pain and hematuria.  Musculoskeletal: Negative.  Negative for back pain.  Skin: Negative.  Negative for rash.  Neurological: Negative.  Negative for sensory change, focal weakness and weakness.  Psychiatric/Behavioral: The patient is nervous/anxious.     As per HPI. Otherwise, a complete review of systems is negative.  PAST MEDICAL HISTORY: Past Medical History:  Diagnosis Date  . Asthma   . Complication of anesthesia   . GERD (gastroesophageal  reflux disease)   . Headache    occular HA  . Heart murmur   . History of hiatal hernia   . Hypertension   . Irregular heart beat    pac's and pvc's  . Pneumonia    11-2016  . PONV (postoperative nausea and vomiting)   . Renal cell cancer, left ([facility])     PAST SURGICAL HISTORY: Past Surgical History:  Procedure Laterality Date  . ABDOMINAL HYSTERECTOMY    . ABLATION     prior to hysterectomy  . CORONARY ANGIOPLASTY    . CYSTOSCOPY/RETROGRADE/URETEROSCOPY     01-20-17 Dr. Junious Silk  . CYSTOSCOPY/RETROGRADE/URETEROSCOPY Left 01/20/2017   Procedure: CYSTOSCOPY/RETROGRADE/URETEROSCOPY/ BIOPSY,BLADDER BIOPSY PLACEMENT STENT LEFT URETER;  Surgeon: Festus Aloe, MD;  Location: [facility];  Service: Urology;  Laterality: Left;  . DILATION AND CURETTAGE OF UTERUS    . NASAL ENDOSCOPY WITH EPISTAXIS CONTROL    . ROBOTIC ASSITED PARTIAL NEPHRECTOMY Left 02/24/2017   Procedure: XI ROBOTIC ASSITED LEFT  RADICAL NEPHRECTOMY;  Surgeon: Alexis Frock, MD;  Location: [facility];  Service: Urology;  Laterality: Left;  . TUBAL LIGATION      FAMILY HISTORY: Family History  Problem Relation Age of Onset  . Hypertension Brother     ADVANCED DIRECTIVES (Y/N):  N  HEALTH MAINTENANCE: Social History   Tobacco Use  . Smoking status: Never Smoker  . Smokeless tobacco: Never Used  Substance Use Topics  . Alcohol use: No  . Drug use: No     Colonoscopy:  PAP:  Bone density:  Lipid panel:  Allergies  Allergen Reactions  . Ciprofloxacin Other (See Comments)    Arms were numb  .  Steri-Strip Compound Benzoin [Benzoin Compound] Other (See Comments)    Blisters    Current Outpatient Medications  Medication Sig Dispense Refill  . ADVAIR DISKUS 100-50 MCG/DOSE AEPB Take 2 puffs by mouth See admin instructions. 2 puffs scheduled once in the morning, may repeat dose in the evening if wheezing.    Marland Kitchen ALPRAZolam (XANAX) 0.25 MG tablet Take 0.25 mg by mouth at bedtime as needed for anxiety.  Anxiety.  5  . amLODipine (NORVASC) 5 MG tablet Take 10 mg by mouth daily.     Marland Kitchen aspirin EC 81 MG tablet Take 81 mg by mouth daily.    . Cholecalciferol (VITAMIN D3) 1000 units CAPS Take 1 capsule by mouth daily.    Marland Kitchen estradiol (ESTRACE) 2 MG tablet Take 2 mg by mouth daily.  6  . lisinopril (PRINIVIL,ZESTRIL) 10 MG tablet Take 10 mg by mouth daily.    . Magnesium 250 MG TABS Take 250 mg by mouth daily.     . montelukast (SINGULAIR) 10 MG tablet Take 10 mg by mouth daily.    . pantoprazole (PROTONIX) 40 MG tablet Take 40 mg by mouth 2 (two) times daily.    . potassium chloride (MICRO-K) 10 MEQ CR capsule Take 20 mEq by mouth daily.  11  . PROAIR HFA 108 (90 BASE) MCG/ACT inhaler Inhale 2 puffs into the lungs every 6 (six) hours as needed. For shortness of breath and wheezing.    . ranitidine (ZANTAC) 75 MG tablet Take 75 mg by mouth 2 (two) times daily.    . sucralfate (CARAFATE) 1 G tablet Take 1 g by mouth 2 (two) times daily.     . SUNItinib (SUTENT) 37.5 MG capsule Take 1 capsule (37.5 mg total) by mouth daily. Take for 28 days on, then 14 days off. 28 capsule 5  . triamterene-hydrochlorothiazide (DYAZIDE) 37.5-25 MG per capsule Take 1 capsule by mouth daily.     No current facility-administered medications for this visit.     OBJECTIVE: There were no vitals filed for this visit.   There is no height or weight on file to calculate BMI.    ECOG FS:0 - Asymptomatic  General: Well-developed, well-nourished, no acute distress. Eyes: Pink conjunctiva, anicteric sclera. HEENT: Normocephalic, moist mucous membranes, clear oropharnyx. Lungs: Clear to auscultation bilaterally. Heart: Regular rate and rhythm. No rubs, murmurs, or gallops. Abdomen: Soft, nontender, nondistended. No organomegaly noted, normoactive bowel sounds. Musculoskeletal: No edema, cyanosis, or clubbing. Neuro: Alert, answering all questions appropriately. Cranial nerves grossly intact. Skin: No rashes or petechiae  noted. Psych: Normal affect.  LAB RESULTS:  Lab Results  Component Value Date   NA 133 (L) 08/07/2017   K 3.7 08/07/2017   CL 100 08/07/2017   CO2 23 08/07/2017   GLUCOSE 124 (H) 08/07/2017   BUN 24 (H) 08/07/2017   CREATININE 1.21 (H) 08/07/2017   CALCIUM 9.2 08/07/2017   PROT 7.2 08/07/2017   ALBUMIN 4.1 08/07/2017   AST 22 08/07/2017   ALT 15 08/07/2017   ALKPHOS 50 08/07/2017   BILITOT 0.5 08/07/2017   GFRNONAA 47 (L) 08/07/2017   GFRAA 54 (L) 08/07/2017    Lab Results  Component Value Date   WBC 8.3 08/07/2017   NEUTROABS 4.4 08/07/2017   HGB 11.2 (L) 08/07/2017   HCT 31.9 (L) 08/07/2017   MCV 92.2 08/07/2017   PLT 293 08/07/2017     STUDIES: No results found.  ASSESSMENT: Stage III clear cell renal cell carcinoma of the left kidney  PLAN:    1. Stage III clear cell renal cell carcinoma of the left kidney: Patient's nephrectomy was on February 24, 2017.  Patient could not tolerate 50 mg or a dose reduction of 37.5 mg of sorafenib secondary to worsening diarrhea, vomiting, and abnormal thyroid panel.  Because of this, treatment was discontinued altogether.  Restaging CT scan completed on June 23, 2017 reviewed independently and report as above with no evidence of progressive or metastatic disease.  No further intervention is needed at this time.  Return to clinic in 3 months with repeat laboratory work and further evaluation. 2.  Elevated TSH: Significantly improved, but not yet back to normal, after discontinuing sorafenib.  Monitor. 3.  Diarrhea: Resolved.  Secondary to sorafenib.  Treatment discontinued as above. 4.  Renal insufficiency: Likely secondary to diarrhea and dehydration.  Creatinine is now improving. 5.  Anemia: Mild, monitor. 5.  Hypokalemia: Resolved.    Patient expressed understanding and was in agreement with this plan. She also understands that She can call clinic at any time with any questions, concerns, or complaints.   Cancer  Staging Cancer of left kidney [facility]) Staging form: Kidney, AJCC 8th Edition - Clinical stage from 03/12/2017: Stage III (cT3a, cN0, cM0) - Signed by Lloyd Huger, MD on 03/12/2017   Lloyd Huger, MD   08/14/2017 12:22 AM

## 2017-08-18 ENCOUNTER — Ambulatory Visit: Payer: Commercial Managed Care - PPO | Admitting: Oncology

## 2017-09-18 ENCOUNTER — Ambulatory Visit (INDEPENDENT_AMBULATORY_CARE_PROVIDER_SITE_OTHER): Payer: Commercial Managed Care - PPO | Admitting: Pulmonary Disease

## 2017-09-18 ENCOUNTER — Other Ambulatory Visit
Admission: RE | Admit: 2017-09-18 | Discharge: 2017-09-18 | Disposition: A | Discharge status: Home / Self Care | Payer: Commercial Managed Care - PPO | Source: Ambulatory Visit | Attending: Pulmonary Disease | Admitting: Pulmonary Disease

## 2017-09-18 ENCOUNTER — Encounter: Payer: Self-pay | Admitting: Pulmonary Disease

## 2017-09-18 VITALS — BP 124/80 | HR 77 | Ht 66.0 in | Wt 160.0 lb

## 2017-09-18 DIAGNOSIS — R079 Chest pain, unspecified: Secondary | ICD-10-CM | POA: Diagnosis not present

## 2017-09-18 DIAGNOSIS — R05 Cough: Secondary | ICD-10-CM

## 2017-09-18 DIAGNOSIS — J4540 Moderate persistent asthma, uncomplicated: Secondary | ICD-10-CM | POA: Diagnosis not present

## 2017-09-18 DIAGNOSIS — R059 Cough, unspecified: Secondary | ICD-10-CM

## 2017-09-18 LAB — BRAIN NATRIURETIC PEPTIDE: B NATRIURETIC PEPTIDE 5: 85 pg/mL (ref 0.0–100.0)

## 2017-09-18 LAB — BASIC METABOLIC PANEL
Anion gap: 10 (ref 5–15)
BUN: 34 mg/dL — AB (ref 8–23)
CO2: 27 mmol/L (ref 22–32)
Calcium: 9.4 mg/dL (ref 8.9–10.3)
Chloride: 103 mmol/L (ref 98–111)
Creatinine, Ser: 1.24 mg/dL — ABNORMAL HIGH (ref 0.44–1.00)
GFR calc Af Amer: 53 mL/min — ABNORMAL LOW (ref >60)
GFR calc non Af Amer: 46 mL/min — ABNORMAL LOW (ref >60)
Glucose, Bld: 102 mg/dL — ABNORMAL HIGH (ref 70–99)
Potassium: 3.7 mmol/L (ref 3.5–5.1)
SODIUM: 140 mmol/L (ref 135–145)

## 2017-09-18 MED ORDER — LOSARTAN POTASSIUM 50 MG PO TABS: 50.0000 mg | ORAL_TABLET | Freq: Every day | ORAL | 11 refills | Status: DC

## 2017-09-18 NOTE — Patient Instructions (Addendum)
Stop lisinopril Resume losartan (Cozaar) at 50 mg daily Continue Advair Diskus 250-50, 1 inhalation twice a day.  Rinse mouth after use Continue albuterol inhaler as needed for increased shortness of breath, chest tightness, cough, wheezing Blood tests today: Basic metabolic panel, B natruretic peptide Pulmonary function tests ordered Follow-up in 4 to 6 weeks

## 2017-09-19 NOTE — Progress Notes (Signed)
PULMONARY CONSULT NOTE  Requesting MD/Service: Self Date of initial consultation: 09/18/17 Reason for consultation: Moderate persistent asthma  PT PROFILE: 62 y.o. female never smoker with history of asthma since childhood self-referred for persistent dyspnea and chest tightness. PMH:   DATA: 12/22/16 PFTs [name]): FVC: 2.87 L (93 %pred), FEV1: 2.11 L (85 %pred), FEV1/FVC: 73%, TLC: 98 %pred, DLCO 97 %pred  11/29/17 CT chest: Central tree-in-bud opacity with scattered nodularity and patchy ground-glass attenuation identified in the right upper, middle, and lower lobes 06/23/17 CT chest: No significant findings.  Previously seen inflammatory changes resolved   INTERVAL:  HPI:  As above.  She reports that she was initially diagnosed with asthma as a child.  She "grew out of it" as an adult.  However, she had asthma relapse in 1982 during her first pregnancy.  At her baseline, she is maintained on Advair and montelukast with no requirement for albuterol rescue inhaler.  She suffered a flulike illness in November 2018.  At that time, she had fevers, myalgias, shortness of breath and chest tightness.  She underwent a CT scan of the chest ultimately which revealed the findings as documented above.  Also noted on that CT scan was concern for a left kidney mass.  She underwent further evaluation of this and ultimately was found to have a left renal cell carcinoma for which she underwent nephrectomy in February of this year.  She has been on chemotherapy with some problems (liver toxicity).  Now, she reports persistent sensation of dyspnea.  She describes class I-II exertional dyspnea (short of breath when ambulating inclines).  She also notes chest discomfort with radiation to her neck.  She actually has to different types of chest discomfort-tightness with exertion that radiates to her neck.  She also has intermittent burning chest discomfort which she attributes to acid reflux.  Since February  of this year she has been on 1 or 2 courses of prednisone which she believes helps the symptoms.  She is using her albuterol inhaler more frequently now which again gives her transient relief.  Her dose of Advair Diskus has been increased from the 100 strength to the 250 strength.  She she admits to taking 2 actuations twice a day (rather than the prescribed 1 actuation twice a day).  She has intermittent nonproductive cough which she describes as "throat clearing". Notably, she is on an ACE inhibitor.  She was previously on losartan but this was discontinued because of tainted product.  She has never smoked.  There is no significant occupational or environmental exposures.    Past Medical History:  Diagnosis Date  . Asthma   . Complication of anesthesia   . GERD (gastroesophageal reflux disease)   . Headache    occular HA  . Heart murmur   . History of hiatal hernia   . Hypertension   . Irregular heart beat    pac's and pvc's  . Pneumonia    11-2016  . PONV (postoperative nausea and vomiting)   . Renal cell cancer, left [facility])     Past Surgical History:  Procedure Laterality Date  . ABDOMINAL HYSTERECTOMY    . ABLATION     prior to hysterectomy  . CORONARY ANGIOPLASTY    . CYSTOSCOPY/RETROGRADE/URETEROSCOPY     01-20-17 Dr. Junious Silk  . CYSTOSCOPY/RETROGRADE/URETEROSCOPY Left 01/20/2017   Procedure: CYSTOSCOPY/RETROGRADE/URETEROSCOPY/ BIOPSY,BLADDER BIOPSY PLACEMENT STENT LEFT URETER;  Surgeon: Festus Aloe, MD;  Location: [facility];  Service: Urology;  Laterality: Left;  . DILATION AND CURETTAGE  OF UTERUS    . NASAL ENDOSCOPY WITH EPISTAXIS CONTROL    . ROBOTIC ASSITED PARTIAL NEPHRECTOMY Left 02/24/2017   Procedure: XI ROBOTIC ASSITED LEFT  RADICAL NEPHRECTOMY;  Surgeon: Alexis Frock, MD;  Location: [facility];  Service: Urology;  Laterality: Left;  . TUBAL LIGATION      MEDICATIONS: I have reviewed all medications and confirmed regimen as documented  Social History    Socioeconomic History  . Marital status: Married    Spouse name: Not on file  . Number of children: Not on file  . Years of education: Not on file  . Highest education level: Not on file  Occupational History  . Not on file  Social Needs  . Financial resource strain: Not on file  . Food insecurity:    Worry: Not on file    Inability: Not on file  . Transportation needs:    Medical: Not on file    Non-medical: Not on file  Tobacco Use  . Smoking status: Never Smoker  . Smokeless tobacco: Never Used  Substance and Sexual Activity  . Alcohol use: No  . Drug use: No  . Sexual activity: Yes  Lifestyle  . Physical activity:    Days per week: Not on file    Minutes per session: Not on file  . Stress: Not on file  Relationships  . Social connections:    Talks on phone: Not on file    Gets together: Not on file    Attends religious service: Not on file    Active member of club or organization: Not on file    Attends meetings of clubs or organizations: Not on file    Relationship status: Not on file  . Intimate partner violence:    Fear of current or ex partner: Not on file    Emotionally abused: Not on file    Physically abused: Not on file    Forced sexual activity: Not on file  Other Topics Concern  . Not on file  Social History Narrative  . Not on file    Family History  Problem Relation Age of Onset  . Hypertension Brother     ROS: No fever, myalgias/arthralgias, unexplained weight loss or weight gain No new focal weakness or sensory deficits No otalgia, hearing loss, visual changes, nasal and sinus symptoms, mouth and throat problems No neck pain or adenopathy No abdominal pain, N/V/D, diarrhea, change in bowel pattern No dysuria, change in urinary pattern   Vitals:   09/18/17 0909 09/18/17 0914  BP:  124/80  Pulse:  77  SpO2:  95%  Weight: 160 lb (72.6 kg)   Height: 5\' 6"  (1.676 m)      EXAM:  Gen: WDWN, No overt respiratory distress HEENT:  NCAT, sclera white, oropharynx normal Neck: Supple without LAN, thyromegaly, JVD Lungs: breath sounds full without wheezes or other adventitious sounds, percussion normal Cardiovascular: RRR, no murmurs noted Abdomen: Soft, nontender, normal BS Ext: without clubbing, cyanosis, edema Neuro: CNs grossly intact, motor and sensory intact Skin: Limited exam, no lesions noted  DATA:   BMP Latest Ref Rng & Units 09/18/2017 08/07/2017 06/27/2017  Glucose 70 - 99 mg/dL 102(H) 124(H) 151(H)  BUN 8 - 23 mg/dL 34(H) 24(H) 22  Creatinine 0.44 - 1.00 mg/dL 1.24(H) 1.21(H) 1.24(H)  Sodium 135 - 145 mmol/L 140 133(L) 133(L)  Potassium 3.5 - 5.1 mmol/L 3.7 3.7 3.8  Chloride 98 - 111 mmol/L 103 100 100  CO2 22 - 32 mmol/L 27  23 24  Calcium 8.9 - 10.3 mg/dL 9.4 9.2 9.1    CBC Latest Ref Rng & Units 08/07/2017 06/27/2017 06/19/2017  WBC 3.6 - 11.0 K/uL 8.3 5.8 6.2  Hemoglobin 12.0 - 16.0 g/dL 11.2(L) 11.6(L) 13.7  Hematocrit 35.0 - 47.0 % 31.9(L) 33.1(L) 39.0  Platelets 150 - 440 K/uL 293 167 216    CXR: No recent film available for my review  I have personally reviewed all chest radiographs reported above including CXRs and CT chest unless otherwise indicated  IMPRESSION:     ICD-10-CM   1. Moderate persistent asthma without complication Y77.41 Pulmonary Function Test [facility] Only  2. Exertional chest pain R07.9 B Nat Peptide    Basic Metabolic Panel (BMET)    CANCELED: Basic Metabolic Panel (BMET)    CANCELED: B Nat Peptide  3. Cough R05    Moderate persistent asthma without wheezing currently.  Some of the worsening of her respiratory symptoms in the past year might be related to all that she has been through with surgery for renal cell carcinoma etc.  I am concerned about the description of exertional chest pain with radiation to the neck.  She is followed by Dr. Clayborn Bigness and has an appointment with him next month.  It is possible that her exertional dyspnea is in part related to a cardiac  etiology  Chronic throat clearing cough is a classic description for ACE inhibitor induced cough.  PLAN:  Continue Advair Diskus 250-50.  I emphasized that she is to take only a single inhalation twice a day.  I emphasized that she is to rinse her mouth after use.  Continue albuterol inhaler as needed for increased shortness of breath, chest tightness, cough, wheezing  Stop lisinopril  Resume losartan at 50 mg daily.  I discussed this with Dr. Holley Raring of nephrology.  All tainted forms of this medication are no longer on the market  I am concerned about her multiple medications which can increase potassium.  Therefore BMET has been ordered  Also, B natruretic peptide has been ordered  PFTs ordered to be performed prior to follow-up visit in 4 to 6 weeks   Merton Border, MD PCCM service Mobile [phone] Pager [phone] 09/19/2017 9:14 AM

## 2017-09-22 ENCOUNTER — Other Ambulatory Visit: Payer: Self-pay | Admitting: Oncology

## 2017-09-22 ENCOUNTER — Ambulatory Visit
Admission: RE | Admit: 2017-09-22 | Discharge: 2017-09-22 | Disposition: A | Discharge status: Home / Self Care | Payer: Commercial Managed Care - PPO | Source: Ambulatory Visit | Attending: Oncology | Admitting: Oncology

## 2017-09-22 ENCOUNTER — Other Ambulatory Visit: Payer: Self-pay | Admitting: *Deleted

## 2017-09-22 DIAGNOSIS — C649 Malignant neoplasm of unspecified kidney, except renal pelvis: Secondary | ICD-10-CM

## 2017-09-22 DIAGNOSIS — Z905 Acquired absence of kidney: Secondary | ICD-10-CM | POA: Diagnosis not present

## 2017-09-22 DIAGNOSIS — C642 Malignant neoplasm of left kidney, except renal pelvis: Secondary | ICD-10-CM | POA: Diagnosis not present

## 2017-09-26 ENCOUNTER — Ambulatory Visit: Payer: Commercial Managed Care - PPO

## 2017-09-28 ENCOUNTER — Telehealth: Payer: Self-pay | Admitting: Pulmonary Disease

## 2017-09-28 NOTE — Telephone Encounter (Signed)
Pt states there is a recall on Losartan. Please call to discuss.

## 2017-09-28 NOTE — Telephone Encounter (Signed)
Per DS, pt needs to call Dr. Holley Raring to discuss. DS does not want pt on ACE inhibitor.

## 2017-10-02 NOTE — Progress Notes (Signed)
[facility]  Telephone:([phone]) [phone] Fax:(336) [phone]  ID: Heidi Torres OB: Jun 18, 1955  MR#: [id number]  LPF#:[id number]  Patient Care Team: [name], MD as PCP - General (Family Medicine) Lloyd Huger, MD as Medical Oncologist (Oncology)  CHIEF COMPLAINT: Stage III clear cell renal cell carcinoma of the left kidney  INTERVAL HISTORY: Patient returns to clinic today for further evaluation, laboratory work, and discussion of her imaging results.  She currently feels well and is asymptomatic.  She continues to be anxious. She has no neurologic complaints.  She has a fair appetite, but denies weight loss.  She has no chest pain or shortness of breath.  She denies any nausea, vomiting, constipation, or diarrhea.  She has no urinary complaints.  Patient offers no specific complaints today.   REVIEW OF SYSTEMS:   Review of Systems  Constitutional: Positive for malaise/fatigue. Negative for fever and weight loss.  Respiratory: Negative.  Negative for cough and shortness of breath.   Cardiovascular: Negative.  Negative for chest pain and leg swelling.  Gastrointestinal: Negative.  Negative for abdominal pain, blood in stool, diarrhea and melena.  Genitourinary: Negative.  Negative for dysuria, flank pain and hematuria.  Musculoskeletal: Negative.  Negative for back pain.  Skin: Negative.  Negative for rash.  Neurological: Negative.  Negative for sensory change, focal weakness, weakness and headaches.  Psychiatric/Behavioral: The patient is nervous/anxious.     As per HPI. Otherwise, a complete review of systems is negative.  PAST MEDICAL HISTORY: Past Medical History:  Diagnosis Date  . Asthma   . Complication of anesthesia   . GERD (gastroesophageal reflux disease)   . Headache    occular HA  . Heart murmur   . History of hiatal hernia   . Hypertension   . Irregular heart beat    pac's and pvc's  . Pneumonia    11-2016  . PONV  (postoperative nausea and vomiting)   . Renal cell cancer, left ([place])     PAST SURGICAL HISTORY: Past Surgical History:  Procedure Laterality Date  . ABDOMINAL HYSTERECTOMY    . ABLATION     prior to hysterectomy  . CORONARY ANGIOPLASTY    . CYSTOSCOPY/RETROGRADE/URETEROSCOPY     01-20-17 Dr. Junious Silk  . CYSTOSCOPY/RETROGRADE/URETEROSCOPY Left 01/20/2017   Procedure: CYSTOSCOPY/RETROGRADE/URETEROSCOPY/ BIOPSY,BLADDER BIOPSY PLACEMENT STENT LEFT URETER;  Surgeon: Festus Aloe, MD;  Location: [facility];  Service: Urology;  Laterality: Left;  . DILATION AND CURETTAGE OF UTERUS    . NASAL ENDOSCOPY WITH EPISTAXIS CONTROL    . ROBOTIC ASSITED PARTIAL NEPHRECTOMY Left 02/24/2017   Procedure: XI ROBOTIC ASSITED LEFT  RADICAL NEPHRECTOMY;  Surgeon: Alexis Frock, MD;  Location: [facility];  Service: Urology;  Laterality: Left;  . TUBAL LIGATION      FAMILY HISTORY: Family History  Problem Relation Age of Onset  . Hypertension Brother     ADVANCED DIRECTIVES (Y/N):  N  HEALTH MAINTENANCE: Social History   Tobacco Use  . Smoking status: Never Smoker  . Smokeless tobacco: Never Used  Substance Use Topics  . Alcohol use: No  . Drug use: No     Colonoscopy:  PAP:  Bone density:  Lipid panel:  Allergies  Allergen Reactions  . Ciprofloxacin Other (See Comments)    Arms were numb  . Steri-Strip Compound Benzoin [Benzoin Compound] Other (See Comments)    Blisters    Current Outpatient Medications  Medication Sig Dispense Refill  . ADVAIR DISKUS 100-50 MCG/DOSE AEPB Take 2 puffs by mouth See  admin instructions. 2 puffs scheduled once in the morning, may repeat dose in the evening if wheezing.    Marland Kitchen ALPRAZolam (XANAX) 0.25 MG tablet Take 0.25 mg by mouth at bedtime as needed for anxiety. Anxiety.  5  . aspirin EC 81 MG tablet Take 81 mg by mouth daily.    . calcitRIOL (ROCALTROL) 0.25 MCG capsule     . Cholecalciferol (VITAMIN D3) 1000 units CAPS Take 1 capsule by mouth daily.      . ciclopirox (PENLAC) 8 % solution     . estradiol (ESTRACE) 2 MG tablet Take 2 mg by mouth daily.  6  . levalbuterol (XOPENEX HFA) 45 MCG/ACT inhaler     . montelukast (SINGULAIR) 10 MG tablet Take 10 mg by mouth daily.    . pantoprazole (PROTONIX) 40 MG tablet Take 40 mg by mouth 2 (two) times daily.    . potassium chloride (MICRO-K) 10 MEQ CR capsule Take 20 mEq by mouth daily.  11  . ranitidine (ZANTAC) 75 MG tablet Take 75 mg by mouth 2 (two) times daily.    . sucralfate (CARAFATE) 1 G tablet Take 1 g by mouth 2 (two) times daily.     Marland Kitchen triamterene-hydrochlorothiazide (DYAZIDE) 37.5-25 MG per capsule Take 1 capsule by mouth daily.     No current facility-administered medications for this visit.     OBJECTIVE: Vitals:   10/03/17 1424  BP: 120/72  Pulse: (!) 55  Resp: 18  Temp: 97.6 F (36.4 C)     Body mass index is 25.84 kg/m.    ECOG FS:0 - Asymptomatic  General: Well-developed, well-nourished, no acute distress. Eyes: Pink conjunctiva, anicteric sclera. HEENT: Normocephalic, moist mucous membranes. Lungs: Clear to auscultation bilaterally. Heart: Regular rate and rhythm. No rubs, murmurs, or gallops. Abdomen: Soft, nontender, nondistended. No organomegaly noted, normoactive bowel sounds. Musculoskeletal: No edema, cyanosis, or clubbing. Neuro: Alert, answering all questions appropriately. Cranial nerves grossly intact. Skin: No rashes or petechiae noted. Psych: Normal affect.  LAB RESULTS:  Lab Results  Component Value Date   NA 138 10/03/2017   K 3.7 10/03/2017   CL 100 10/03/2017   CO2 26 10/03/2017   GLUCOSE 89 10/03/2017   BUN 31 (H) 10/03/2017   CREATININE 1.28 (H) 10/03/2017   CALCIUM 10.0 10/03/2017   PROT 7.6 10/03/2017   ALBUMIN 4.5 10/03/2017   AST 23 10/03/2017   ALT 16 10/03/2017   ALKPHOS 42 10/03/2017   BILITOT 0.6 10/03/2017   GFRNONAA 44 (L) 10/03/2017   GFRAA 51 (L) 10/03/2017    Lab Results  Component Value Date   WBC 6.7  10/03/2017   NEUTROABS 3.2 10/03/2017   HGB 12.6 10/03/2017   HCT 37.2 10/03/2017   MCV 89.0 10/03/2017   PLT 283 10/03/2017     STUDIES: Ct Abdomen Pelvis Wo Contrast  Result Date: 09/22/2017 CLINICAL DATA:  Renal cell carcinoma, status post left nephrectomy EXAM: CT ABDOMEN AND PELVIS WITHOUT CONTRAST TECHNIQUE: Multidetector CT imaging of the abdomen and pelvis was performed following the standard protocol without IV contrast. COMPARISON:  06/23/2017 FINDINGS: Lower chest: Mild lingular scarring. Small pericardial effusion. Hepatobiliary: Unenhanced liver is unremarkable. Gallbladder is unremarkable. No intrahepatic or extrahepatic ductal dilatation. Pancreas: Within normal limits. Spleen: Within normal limits. Adrenals/Urinary Tract: Right adrenal glands within normal limits. Left adrenal gland is not discretely visualized and may be surgically absent. Status post left nephrectomy. No abnormal soft tissue in the surgical bed. Right kidney is within normal limits. No renal calculi or  hydronephrosis. Bladder is within normal limits. Stomach/Bowel: Stomach is within normal limits. No evidence of bowel obstruction. Normal appendix (series 2/image 50). Vascular/Lymphatic: No evidence of abdominal aortic aneurysm. Atherosclerotic calcifications of the abdominal aorta and branch vessels. No suspicious abdominopelvic lymphadenopathy. Reproductive: Status post hysterectomy. Bilateral ovaries are within normal limits. Other: No abdominopelvic ascites. Musculoskeletal: Mild degenerative changes at L5-S1. IMPRESSION: Status post left nephrectomy and suspected left adrenalectomy. No evidence of recurrent or metastatic disease. Electronically Signed   By: Julian Hy M.D.   On: 09/22/2017 12:55    ASSESSMENT: Stage III clear cell renal cell carcinoma of the left kidney  PLAN:    1. Stage III clear cell renal cell carcinoma of the left kidney: Patient's nephrectomy was on February 24, 2017.  Patient  could not tolerate 50 mg or a dose reduction of 37.5 mg of sorafenib secondary to worsening diarrhea, vomiting, and abnormal thyroid panel.  Because of this, treatment was discontinued altogether.  CT scan results from September 22, 2017 reviewed independently and report as above with no evidence of progressive or metastatic disease.  No intervention is needed at this time.  Return to clinic in 6 months with repeat laboratory can further evaluation.  Reimage in September 2020. 2.  Elevated TSH: Resolved.  Secondary to sorafenib.  3.  Renal insufficiency: Patient's creatinine appears to be approximately her baseline.  Monitor.    I spent a total of 30 minutes face-to-face with the patient of which greater than 50% of the visit was spent in counseling and coordination of care as detailed above.   Patient expressed understanding and was in agreement with this plan. She also understands that She can call clinic at any time with any questions, concerns, or complaints.   Cancer Staging Cancer of left kidney [facility]) Staging form: Kidney, AJCC 8th Edition - Clinical stage from 03/12/2017: Stage III (cT3a, cN0, cM0) - Signed by Lloyd Huger, MD on 03/12/2017   Lloyd Huger, MD   10/06/2017 4:05 PM

## 2017-10-03 ENCOUNTER — Encounter: Payer: Self-pay | Admitting: Oncology

## 2017-10-03 ENCOUNTER — Other Ambulatory Visit: Payer: Self-pay

## 2017-10-03 ENCOUNTER — Inpatient Hospital Stay: Payer: Commercial Managed Care - PPO | Attending: Oncology

## 2017-10-03 ENCOUNTER — Inpatient Hospital Stay (HOSPITAL_BASED_OUTPATIENT_CLINIC_OR_DEPARTMENT_OTHER): Payer: Commercial Managed Care - PPO | Admitting: Oncology

## 2017-10-03 VITALS — BP 120/72 | HR 55 | Temp 97.6°F | Resp 18 | Wt 160.1 lb

## 2017-10-03 DIAGNOSIS — N289 Disorder of kidney and ureter, unspecified: Secondary | ICD-10-CM

## 2017-10-03 DIAGNOSIS — C642 Malignant neoplasm of left kidney, except renal pelvis: Secondary | ICD-10-CM | POA: Diagnosis not present

## 2017-10-03 LAB — COMPREHENSIVE METABOLIC PANEL
ALBUMIN: 4.5 g/dL (ref 3.5–5.0)
ALT: 16 U/L (ref 0–44)
ANION GAP: 12 (ref 5–15)
AST: 23 U/L (ref 15–41)
Alkaline Phosphatase: 42 U/L (ref 38–126)
BUN: 31 mg/dL — ABNORMAL HIGH (ref 8–23)
CHLORIDE: 100 mmol/L (ref 98–111)
CO2: 26 mmol/L (ref 22–32)
Calcium: 10 mg/dL (ref 8.9–10.3)
Creatinine, Ser: 1.28 mg/dL — ABNORMAL HIGH (ref 0.44–1.00)
GFR calc Af Amer: 51 mL/min — ABNORMAL LOW (ref >60)
GFR calc non Af Amer: 44 mL/min — ABNORMAL LOW (ref >60)
GLUCOSE: 89 mg/dL (ref 70–99)
POTASSIUM: 3.7 mmol/L (ref 3.5–5.1)
SODIUM: 138 mmol/L (ref 135–145)
Total Bilirubin: 0.6 mg/dL (ref 0.3–1.2)
Total Protein: 7.6 g/dL (ref 6.5–8.1)

## 2017-10-03 LAB — CBC WITH DIFFERENTIAL/PLATELET
BASOS ABS: 0.1 10*3/uL (ref 0–0.1)
Basophils Relative: 1 %
Eosinophils Absolute: 0.9 10*3/uL — ABNORMAL HIGH (ref 0–0.7)
Eosinophils Relative: 13 %
HEMATOCRIT: 37.2 % (ref 35.0–47.0)
Hemoglobin: 12.6 g/dL (ref 12.0–16.0)
LYMPHS ABS: 2.1 10*3/uL (ref 1.0–3.6)
LYMPHS PCT: 32 %
MCH: 30.1 pg (ref 26.0–34.0)
MCHC: 33.8 g/dL (ref 32.0–36.0)
MCV: 89 fL (ref 80.0–100.0)
MONO ABS: 0.5 10*3/uL (ref 0.2–0.9)
MONOS PCT: 7 %
NEUTROS ABS: 3.2 10*3/uL (ref 1.4–6.5)
Neutrophils Relative %: 47 %
Platelets: 283 10*3/uL (ref 150–440)
RBC: 4.18 MIL/uL (ref 3.80–5.20)
RDW: 13.1 % (ref 11.5–14.5)
WBC: 6.7 10*3/uL (ref 3.6–11.0)

## 2017-10-03 LAB — PHOSPHORUS: Phosphorus: 3.4 mg/dL (ref 2.5–4.6)

## 2017-10-03 LAB — MAGNESIUM: Magnesium: 2 mg/dL (ref 1.7–2.4)

## 2017-10-03 NOTE — Progress Notes (Signed)
Patient here today for follow up, reports fatigue and muscle weakness.

## 2017-10-04 LAB — THYROID PANEL WITH TSH
Free Thyroxine Index: 2.5 (ref 1.2–4.9)
T3 Uptake Ratio: 26 % (ref 24–39)
T4, Total: 9.7 ug/dL (ref 4.5–12.0)
TSH: 3.23 u[IU]/mL (ref 0.450–4.500)

## 2017-10-04 LAB — VITAMIN D 25 HYDROXY (VIT D DEFICIENCY, FRACTURES): Vit D, 25-Hydroxy: 66 ng/mL (ref 30.0–100.0)

## 2017-10-17 ENCOUNTER — Telehealth: Payer: Self-pay | Admitting: Pulmonary Disease

## 2017-10-17 NOTE — Telephone Encounter (Signed)
Noted. Nothing further needed. 

## 2017-10-17 NOTE — Telephone Encounter (Signed)
Unable to contact hospital scheduling . Please call patient to reschedule pft.

## 2017-10-17 NOTE — Telephone Encounter (Signed)
Spoke to patient, gave her number to r/s her PFT. Pt aware with no further questions at this time.

## 2017-10-17 NOTE — Telephone Encounter (Signed)
Patient calling States that she will need to cancel/reschedule her PFT on 10/17 Transferred to hospital scheduling, wanted to make office aware

## 2017-10-19 ENCOUNTER — Ambulatory Visit: Payer: Commercial Managed Care - PPO

## 2017-10-23 ENCOUNTER — Ambulatory Visit: Payer: Commercial Managed Care - PPO | Admitting: Pulmonary Disease

## 2017-11-07 ENCOUNTER — Ambulatory Visit: Payer: Commercial Managed Care - PPO | Attending: Pulmonary Disease

## 2017-11-07 DIAGNOSIS — J4540 Moderate persistent asthma, uncomplicated: Secondary | ICD-10-CM | POA: Diagnosis not present

## 2017-11-07 MED ORDER — ALBUTEROL SULFATE (2.5 MG/3ML) 0.083% IN NEBU
2.5000 mg | INHALATION_SOLUTION | Freq: Once | RESPIRATORY_TRACT | Status: AC
  Administered 2017-11-07: 2.5 mg via RESPIRATORY_TRACT
  Filled 2017-11-07: qty 3

## 2017-11-08 ENCOUNTER — Ambulatory Visit (INDEPENDENT_AMBULATORY_CARE_PROVIDER_SITE_OTHER): Payer: Commercial Managed Care - PPO | Admitting: Pulmonary Disease

## 2017-11-08 ENCOUNTER — Encounter: Payer: Self-pay | Admitting: Pulmonary Disease

## 2017-11-08 VITALS — BP 122/64 | HR 64 | Ht 66.0 in | Wt 159.8 lb

## 2017-11-08 DIAGNOSIS — J4550 Severe persistent asthma, uncomplicated: Secondary | ICD-10-CM

## 2017-11-08 DIAGNOSIS — D721 Eosinophilia, unspecified: Secondary | ICD-10-CM

## 2017-11-08 MED ORDER — FLUTICASONE-SALMETEROL 500-50 MCG/DOSE IN AEPB: 1.0000 | INHALATION_SPRAY | Freq: Two times a day (BID) | RESPIRATORY_TRACT | 5 refills | Status: DC

## 2017-11-08 NOTE — Patient Instructions (Signed)
Change Advair to high-strength (500-50), 1 inhalation twice a day.  Rinse mouth after use  Continue Xopenex inhaler as needed for increased shortness of breath, wheezing, chest tightness, cough  We will work on getting approval for Fasenra (injectable biological therapy to reduce eosinophils)  Follow-up in 6 to 8 weeks.  Call sooner if needed

## 2017-11-09 ENCOUNTER — Ambulatory Visit: Payer: Commercial Managed Care - PPO | Admitting: Pulmonary Disease

## 2017-11-09 NOTE — Progress Notes (Addendum)
PULMONARY OFFICE FOLLOW-UP NOTE  Requesting MD/Service: Self Date of initial consultation: 09/18/17 Reason for consultation: Moderate persistent asthma  PT PROFILE: 62 y.o. female never smoker with history of asthma since childhood self-referred for persistent dyspnea and chest tightness. PMH:   DATA: 12/22/16 PFTs [name]): FVC: 2.87 L (93 %pred), FEV1: 2.11 L (85 %pred), FEV1/FVC: 73%, TLC: 98 %pred, DLCO 97 %pred  11/29/17 CT chest: Central tree-in-bud opacity with scattered nodularity and patchy ground-glass attenuation identified in the right upper, middle, and lower lobes 06/23/17 CT chest: No significant findings.  Previously seen inflammatory changes resolved 11/07/17 PFTs: FVC: 2.29 > 2.82 L (68 > 84 %pred), FEV1: 1.62 > 2.12 L (62 > 81 %pred), FEV1/FVC: 71%, TLC: 6.06 L (113 %pred), DLCO 83 %pred    INTERVAL:  SUBJ:  This is a scheduled follow-up.  She has no new complaints.  She is compliant with Advair using it twice a day.  She is also on montelukast.  Nonetheless, she has frequent chest tightness and is using rescue inhaler on average 2-3 times per day.  She is unable to identify any specific triggers.  She is here to review results of PFTs.    Vitals:   11/08/17 1626 11/08/17 1628  BP:  122/64  Pulse:  64  SpO2:  99%  Weight: 159 lb 12.8 oz (72.5 kg)   Height: 5\' 6"  (1.676 m)   RA  EXAM:  Gen: NAD HEENT: NCAT, sclera white Neck: No JVD Lungs: breath sounds full, no wheezes or other adventitious sounds Cardiovascular: RRR, no murmurs Abdomen: Soft, nontender, normal BS Ext: without clubbing, cyanosis, edema Neuro: grossly intact Skin: Limited exam, no lesions noted   DATA:   BMP Latest Ref Rng & Units 10/03/2017 09/18/2017 08/07/2017  Glucose 70 - 99 mg/dL 89 102(H) 124(H)  BUN 8 - 23 mg/dL 31(H) 34(H) 24(H)  Creatinine 0.44 - 1.00 mg/dL 1.28(H) 1.24(H) 1.21(H)  Sodium 135 - 145 mmol/L 138 140 133(L)  Potassium 3.5 - 5.1 mmol/L 3.7 3.7 3.7   Chloride 98 - 111 mmol/L 100 103 100  CO2 22 - 32 mmol/L 26 27 23   Calcium 8.9 - 10.3 mg/dL 10.0 9.4 9.2    CBC Latest Ref Rng & Units 10/03/2017 08/07/2017 06/27/2017  WBC 3.6 - 11.0 K/uL 6.7 8.3 5.8  Hemoglobin 12.0 - 16.0 g/dL 12.6 11.2(L) 11.6(L)  Hematocrit 35.0 - 47.0 % 37.2 31.9(L) 33.1(L)  Platelets 150 - 440 K/uL 283 293 167   Abs eos count: 06/01/17: 1600 06/19/17: 1200 06/27/17:   900 08/07/17: 1200 10/03/17:   900  CXR: No recent film available for my review  I have personally reviewed all chest radiographs reported above including CXRs and CT chest unless otherwise indicated  IMPRESSION:     ICD-10-CM   1. Severe persistent asthma without complication Y09.98   2. Eosinophilia D72.1    Despite nearly maximum medical therapy for asthma control, she continues to have inadequate control with frequent use of her rescue inhaler.  Her PFTs also demonstrate moderate obstruction with significant reversibility, again demonstrating inadequate asthma control.  Notably, on the morning of the PFTs she did not take her Advair.  However, she had used it and montelukast the night before  The persistent eosinophilia is noted and suggests that she would likely be a good candidate for biological therapy for asthma (Nucala or Fasenra)  PLAN:  Change Advair to 500/50, 1 inhalation twice daily.  Rinse mouth after use Continue montelukast Continue Xopenex inhaler as needed  We will begin the approval process for Fasenra Follow-up in 6 to 8 weeks.  Call sooner if needed   Merton Border, MD PCCM service Mobile [phone] Pager [phone] 11/09/2017 1:55 PM

## 2017-12-06 ENCOUNTER — Telehealth: Payer: Self-pay | Admitting: Pulmonary Disease

## 2017-12-06 MED ORDER — BENRALIZUMAB 30 MG/ML ~~LOC~~ SOSY: 30.0000 mg | PREFILLED_SYRINGE | SUBCUTANEOUS | 0 refills | Status: DC

## 2017-12-06 NOTE — Telephone Encounter (Signed)
Spoke to [facility], Heidi Torres has been approved for patient. I have sent the prescription on to the specialty pharmacy for dispersement to the patient. LM on VM for patient.

## 2017-12-06 NOTE — Telephone Encounter (Signed)
[place] calling Will need to speak with nurse in regards to dosage and frequency of medication Fasenra Case number [id number] [phone] Please call to discuss

## 2017-12-14 ENCOUNTER — Telehealth: Payer: Self-pay | Admitting: *Deleted

## 2017-12-14 NOTE — Telephone Encounter (Signed)
Called patient back to let her know that once a year should be good per Dr. Grayland Ormond. Patient wanted me to ask Dr. Grayland Ormond if she could come in 3 months. I told her that I would ask him and let her know.

## 2017-12-14 NOTE — Telephone Encounter (Signed)
Is she symptomatic?  I think yearly imaging is ok.

## 2017-12-14 NOTE — Telephone Encounter (Signed)
Patient called asking if she needs a CT Scan. Please advise  ASSESSMENT: Stage III clear cell renal cell carcinoma of the left kidney  PLAN:    1. Stage III clear cell renal cell carcinoma of the left kidney: Patient's nephrectomy was on February 24, 2017.  Patient could not tolerate 50 mg or a dose reduction of 37.5 mg of sorafenib secondary to worsening diarrhea, vomiting, and abnormal thyroid panel.  Because of this, treatment was discontinued altogether.  CT scan results from September 22, 2017 reviewed independently and report as above with no evidence of progressive or metastatic disease.  No intervention is needed at this time.  Return to clinic in 6 months with repeat laboratory can further evaluation.  Reimage in September 2020. 2.  Elevated TSH: Resolved.  Secondary to sorafenib.  3.  Renal insufficiency: Patient's creatinine appears to be approximately her baseline.  Monitor.    I spent a total of 30 minutes face-to-face with the patient of which greater than 50% of the visit was spent in counseling and coordination of care as detailed above.   Patient expressed understanding and was in agreement with this plan. She also understands that She can call clinic at any time with any questions, concerns, or complaints.   Cancer Staging Cancer of left kidney [facility]) Staging form: Kidney, AJCC 8th Edition - Clinical stage from 03/12/2017: Stage III (cT3a, cN0, cM0) - Signed by Lloyd Huger, MD on 03/12/2017

## 2017-12-15 ENCOUNTER — Telehealth: Payer: Self-pay

## 2017-12-15 DIAGNOSIS — C642 Malignant neoplasm of left kidney, except renal pelvis: Secondary | ICD-10-CM

## 2017-12-15 NOTE — Telephone Encounter (Signed)
Spoke with Dr. Grayland Ormond and he stated that it was okay for me to order patient's CT Scan and to schedule her a follow up appointment. Patient will be notified.

## 2017-12-15 NOTE — Telephone Encounter (Signed)
Patient was contacted and she will have her CT Scan and follow up with Dr. Grayland Ormond.

## 2017-12-18 ENCOUNTER — Telehealth: Payer: Self-pay

## 2017-12-18 NOTE — Telephone Encounter (Signed)
LM on VM that insurance is not going to cover [place]. Will need to switch to Nucala. Notified patient she can call back and discuss or we can discuss at follow-up this week.

## 2017-12-18 NOTE — Telephone Encounter (Signed)
Spoke to patient regarding fesenra. She is aware insurance denied. She will discuss with Dr. Alva Garnet at next apt.

## 2017-12-21 ENCOUNTER — Encounter: Payer: Self-pay | Admitting: Pulmonary Disease

## 2017-12-21 ENCOUNTER — Ambulatory Visit (INDEPENDENT_AMBULATORY_CARE_PROVIDER_SITE_OTHER): Payer: Commercial Managed Care - PPO | Admitting: Pulmonary Disease

## 2017-12-21 VITALS — BP 120/80 | HR 59 | Resp 16 | Ht 66.0 in | Wt 162.0 lb

## 2017-12-21 DIAGNOSIS — T464X5A Adverse effect of angiotensin-converting-enzyme inhibitors, initial encounter: Secondary | ICD-10-CM

## 2017-12-21 DIAGNOSIS — R490 Dysphonia: Secondary | ICD-10-CM | POA: Diagnosis not present

## 2017-12-21 DIAGNOSIS — R05 Cough: Secondary | ICD-10-CM | POA: Diagnosis not present

## 2017-12-21 DIAGNOSIS — D721 Eosinophilia, unspecified: Secondary | ICD-10-CM

## 2017-12-21 DIAGNOSIS — J4550 Severe persistent asthma, uncomplicated: Secondary | ICD-10-CM | POA: Diagnosis not present

## 2017-12-21 NOTE — Patient Instructions (Addendum)
As we discussed, I want you to remain off of Lisinopril I expect the cough to improve in 3-4 weeks after discontinuation of lisinopril Cont Advair to 500/50, 1 inhalation twice daily.  Rinse mouth after use Continue montelukast 10 mg each night We will now begin the process of approval for Nucala Continue Xopenex inhaler as needed  As we discussed, if after starting Nucala asthma control is much improved, you may call here and discuss possibly reducing Advair to the 250/50 strength  Follow up in 3 months with PFTs prior to that visit

## 2017-12-21 NOTE — Progress Notes (Signed)
PULMONARY OFFICE FOLLOW-UP NOTE  Requesting MD/Service: Self Date of initial consultation: 09/18/17 Reason for consultation: Moderate persistent asthma  PT PROFILE: 62 y.o. female never smoker with history of asthma since childhood self-referred for persistent dyspnea and chest tightness.   DATA: 12/22/16 PFTs [name]): FVC: 2.87 L (93 %pred), FEV1: 2.11 L (85 %pred), FEV1/FVC: 73%, TLC: 98 %pred, DLCO 97 %pred  11/29/17 CT chest: Central tree-in-bud opacity with scattered nodularity and patchy ground-glass attenuation identified in the right upper, middle, and lower lobes 06/23/17 CT chest: No significant findings.  Previously seen inflammatory changes resolved 11/07/17 PFTs: FVC: 2.29 > 2.82 L (68 > 84 %pred), FEV1: 1.62 > 2.12 L (62 > 81 %pred), FEV1/FVC: 71%, TLC: 6.06 L (113 %pred), DLCO 83 %pred    INTERVAL: Last visit 11/08/2017.  At that time, we tried to get Berna Bue initiated which has now been denied due to insurer preference for either Dupixent or Nucala  SUBJ:  This is a scheduled follow-up. Unfortunately, Berna Bue was denied with insurer preference for [employer] or [employer]. Since increasing the Advair to 500 strength, she believes her asthma control is improved. However, she notices worsened hoarseness with difficulty singing in church.  Because of a recall on losartan, she has resumed lisinopril and now notes persistent moderate nonproductive hacking cough.  She acknowledges that the onset of this cough correlates with the resumption of ACE inhibitor therapy.  She is compliant with Advair using it twice a day.  She remains on montelukast.  She is using her Xopenex rescue inhaler a few of times per week.  She denies CP, fever, purulent sputum, hemoptysis, LE edema and calf tenderness.    Vitals:   12/21/17 1002 12/21/17 1008  BP:  120/80  Pulse:  (!) 59  Resp: 16   SpO2:  100%  Weight: 162 lb (73.5 kg)   Height: 5\' 6"  (1.676 m)   RA  EXAM:  Gen: NAD HEENT: NCAT,  sclera white Neck: No JVD Lungs: breath sounds full, no wheezes or other adventitious sounds Cardiovascular: RRR, no murmurs Abdomen: Soft, nontender, normal BS Ext: without clubbing, cyanosis, edema Neuro: grossly intact Skin: Limited exam, no lesions noted   DATA:   BMP Latest Ref Rng & Units 10/03/2017 09/18/2017 08/07/2017  Glucose 70 - 99 mg/dL 89 102(H) 124(H)  BUN 8 - 23 mg/dL 31(H) 34(H) 24(H)  Creatinine 0.44 - 1.00 mg/dL 1.28(H) 1.24(H) 1.21(H)  Sodium 135 - 145 mmol/L 138 140 133(L)  Potassium 3.5 - 5.1 mmol/L 3.7 3.7 3.7  Chloride 98 - 111 mmol/L 100 103 100  CO2 22 - 32 mmol/L 26 27 23   Calcium 8.9 - 10.3 mg/dL 10.0 9.4 9.2    CBC Latest Ref Rng & Units 10/03/2017 08/07/2017 06/27/2017  WBC 3.6 - 11.0 K/uL 6.7 8.3 5.8  Hemoglobin 12.0 - 16.0 g/dL 12.6 11.2(L) 11.6(L)  Hematocrit 35.0 - 47.0 % 37.2 31.9(L) 33.1(L)  Platelets 150 - 440 K/uL 283 293 167   Abs eos count: 06/01/17: 1600 06/19/17: 1200 06/27/17:   900 08/07/17: 1200 10/03/17:   900  CXR: No new film  I have personally reviewed all chest radiographs reported above including CXRs and CT chest unless otherwise indicated  IMPRESSION:     ICD-10-CM   1. Severe persistent asthma J45.50 Pulmonary Function Test [facility] Only  2. Eosinophilia D72.1   3. Cough due to ACE inhibitor R05    T46.4X5A   4. Dysphonia R49.0    There are multiple interrelated processes going on here.  She very definitely has asthma with an eosinophilic phenotype.  This is likely contributing to cough.  The inhaled steroid is likely contributing to dysphonia and hoarseness.  The ACE inhibitor is contributing to cough and possibly to dysphonia and poor asthma control.    PLAN:  As discussed in the office with her, she is to remain off of lisinopril (she discontinued it yesterday) and find acceptable alternatives.  For now, continue Advair 500/50, 1 inhalation twice a day.  Rinse mouth after use Continue montelukast 10 mg  nightly Continue Xopenex inhaler as needed We are now working on the approval process for [employer]  Once Nucala gets initiated, if her asthma control is much improved, we can consider reducing the Advair strength back to 250/50 with the hopes that this will improve dysphonia  She is to follow-up in 3 months with PFTs prior to that visit.  She knows to contact us over the phone to work through some of the above issues if she has any questions.  We will be contacting her regarding approval of Nucala.   Merton Border, MD PCCM service Mobile [phone] Pager [phone] 12/21/2017 10:39 AM

## 2017-12-22 ENCOUNTER — Ambulatory Visit
Admission: RE | Admit: 2017-12-22 | Discharge: 2017-12-22 | Disposition: A | Discharge status: Home / Self Care | Payer: Commercial Managed Care - PPO | Source: Ambulatory Visit | Attending: Oncology | Admitting: Oncology

## 2017-12-22 DIAGNOSIS — C642 Malignant neoplasm of left kidney, except renal pelvis: Secondary | ICD-10-CM

## 2017-12-26 ENCOUNTER — Ambulatory Visit: Payer: Commercial Managed Care - PPO | Admitting: Oncology

## 2018-01-01 ENCOUNTER — Telehealth: Payer: Self-pay | Admitting: Pulmonary Disease

## 2018-01-01 NOTE — Telephone Encounter (Signed)
Sonya, I called [facility] ([facility] called me this morning and they are closed till Thurs.Marland Kitchen) I will try [facility] again then.  Routing to [place] as FYI.

## 2018-01-01 NOTE — Telephone Encounter (Signed)
Called and spoke with Heidi Torres in regards to benefits. Pt has [employer] and they would not disclose benefits for auto injector or prefilled syringe. Pt is covered for vial/ buy and bill. Nucala paperwork faxed to [facility]

## 2018-01-05 NOTE — Progress Notes (Signed)
[place]  Telephone:(336) [phone] Fax:(336) [phone]  ID: Heidi Torres OB: 10-17-1955  MR#: [id number]  YCX#:[id number]  Patient Care Team: [name], MD as PCP - General (Family Medicine) Lloyd Huger, MD as Medical Oncologist (Oncology)  CHIEF COMPLAINT: Stage III clear cell renal cell carcinoma of the left kidney  INTERVAL HISTORY: Patient returns to clinic today for routine evaluation and discussion of her imaging results.  She continues to feel well and remains asymptomatic. She continues to be anxious. She has no neurologic complaints.  She has a fair appetite, but denies weight loss.  She has no chest pain or shortness of breath.  She denies any nausea, vomiting, constipation, or diarrhea.  She has no urinary complaints.  Patient feels at her baseline offers no specific complaints today.  REVIEW OF SYSTEMS:   Review of Systems  Constitutional: Negative.  Negative for fever, malaise/fatigue and weight loss.  Respiratory: Negative.  Negative for cough and shortness of breath.   Cardiovascular: Negative.  Negative for chest pain and leg swelling.  Gastrointestinal: Negative.  Negative for abdominal pain, blood in stool, diarrhea and melena.  Genitourinary: Negative.  Negative for dysuria, flank pain and hematuria.  Musculoskeletal: Negative.  Negative for back pain.  Skin: Negative.  Negative for rash.  Neurological: Negative.  Negative for sensory change, focal weakness, weakness and headaches.  Psychiatric/Behavioral: Negative.  The patient is not nervous/anxious.     As per HPI. Otherwise, a complete review of systems is negative.  PAST MEDICAL HISTORY: Past Medical History:  Diagnosis Date  . Asthma   . Complication of anesthesia   . GERD (gastroesophageal reflux disease)   . Headache    occular HA  . Heart murmur   . History of hiatal hernia   . Hypertension   . Irregular heart beat    pac's and pvc's  . Pneumonia    11-2016    . PONV (postoperative nausea and vomiting)   . Renal cell cancer, left ([place])     PAST SURGICAL HISTORY: Past Surgical History:  Procedure Laterality Date  . ABDOMINAL HYSTERECTOMY    . ABLATION     prior to hysterectomy  . CORONARY ANGIOPLASTY    . CYSTOSCOPY/RETROGRADE/URETEROSCOPY     01-20-17 Dr. Junious Silk  . CYSTOSCOPY/RETROGRADE/URETEROSCOPY Left 01/20/2017   Procedure: CYSTOSCOPY/RETROGRADE/URETEROSCOPY/ BIOPSY,BLADDER BIOPSY PLACEMENT STENT LEFT URETER;  Surgeon: Festus Aloe, MD;  Location: [facility];  Service: Urology;  Laterality: Left;  . DILATION AND CURETTAGE OF UTERUS    . NASAL ENDOSCOPY WITH EPISTAXIS CONTROL    . ROBOTIC ASSITED PARTIAL NEPHRECTOMY Left 02/24/2017   Procedure: XI ROBOTIC ASSITED LEFT  RADICAL NEPHRECTOMY;  Surgeon: Alexis Frock, MD;  Location: [facility];  Service: Urology;  Laterality: Left;  . TUBAL LIGATION      FAMILY HISTORY: Family History  Problem Relation Age of Onset  . Hypertension Brother     ADVANCED DIRECTIVES (Y/N):  N  HEALTH MAINTENANCE: Social History   Tobacco Use  . Smoking status: Never Smoker  . Smokeless tobacco: Never Used  Substance Use Topics  . Alcohol use: No  . Drug use: No     Colonoscopy:  PAP:  Bone density:  Lipid panel:  Allergies  Allergen Reactions  . Ciprofloxacin Other (See Comments)    Arms were numb  . Steri-Strip Compound Benzoin [Benzoin Compound] Other (See Comments)    Blisters    Current Outpatient Medications  Medication Sig Dispense Refill  . ALPRAZolam (XANAX) 0.25 MG tablet Take  0.25 mg by mouth at bedtime as needed for anxiety. Anxiety.  5  . aspirin EC 81 MG tablet Take 81 mg by mouth daily.    . calcitRIOL (ROCALTROL) 0.25 MCG capsule     . Cholecalciferol (VITAMIN D3) 1000 units CAPS Take 1 capsule by mouth daily.    . ciclopirox (PENLAC) 8 % solution     . estradiol (ESTRACE) 2 MG tablet Take 2 mg by mouth daily.  6  . Fluticasone-Salmeterol (ADVAIR DISKUS) 500-50  MCG/DOSE AEPB Inhale 1 puff into the lungs 2 (two) times daily. 60 each 5  . levalbuterol (XOPENEX HFA) 45 MCG/ACT inhaler     . montelukast (SINGULAIR) 10 MG tablet Take 10 mg by mouth daily.    Marland Kitchen olmesartan (BENICAR) 20 MG tablet Take 20 mg by mouth daily.    . pantoprazole (PROTONIX) 40 MG tablet Take 40 mg by mouth 2 (two) times daily.    . potassium chloride (MICRO-K) 10 MEQ CR capsule Take 20 mEq by mouth daily.  11  . ranitidine (ZANTAC) 75 MG tablet Take 75 mg by mouth 2 (two) times daily.    . sucralfate (CARAFATE) 1 G tablet Take 1 g by mouth 2 (two) times daily.     Marland Kitchen triamterene-hydrochlorothiazide (DYAZIDE) 37.5-25 MG per capsule Take 1 capsule by mouth daily.    . Benralizumab (FASENRA) 30 MG/ML SOSY Inject 30 mg into the skin every 30 (thirty) days. (Patient not taking: Reported on 12/21/2017) 3 mL 0   No current facility-administered medications for this visit.     OBJECTIVE: Vitals:   01/09/18 1029  BP: 113/70     Body mass index is 26.44 kg/m.    ECOG FS:0 - Asymptomatic  General: Well-developed, well-nourished, no acute distress. Eyes: Pink conjunctiva, anicteric sclera. HEENT: Normocephalic, moist mucous membranes. Lungs: Clear to auscultation bilaterally. Heart: Regular rate and rhythm. No rubs, murmurs, or gallops. Abdomen: Soft, nontender, nondistended. No organomegaly noted, normoactive bowel sounds. Musculoskeletal: No edema, cyanosis, or clubbing. Neuro: Alert, answering all questions appropriately. Cranial nerves grossly intact. Skin: No rashes or petechiae noted. Psych: Normal affect.  LAB RESULTS:  Lab Results  Component Value Date   NA 139 01/09/2018   K 3.9 01/09/2018   CL 104 01/09/2018   CO2 28 01/09/2018   GLUCOSE 98 01/09/2018   BUN 28 (H) 01/09/2018   CREATININE 1.19 (H) 01/09/2018   CALCIUM 9.6 01/09/2018   PROT 7.3 01/09/2018   ALBUMIN 4.4 01/09/2018   AST 19 01/09/2018   ALT 19 01/09/2018   ALKPHOS 39 01/09/2018   BILITOT 0.7  01/09/2018   GFRNONAA 49 (L) 01/09/2018   GFRAA 56 (L) 01/09/2018    Lab Results  Component Value Date   WBC 6.4 01/09/2018   NEUTROABS 3.5 01/09/2018   HGB 11.8 (L) 01/09/2018   HCT 37.1 01/09/2018   MCV 87.1 01/09/2018   PLT 263 01/09/2018     STUDIES: Ct Abdomen Pelvis Wo Contrast  Result Date: 12/22/2017 CLINICAL DATA:  Renal cell carcinoma status post left nephrectomy in February 2019. EXAM: CT ABDOMEN AND PELVIS WITHOUT CONTRAST TECHNIQUE: Multidetector CT imaging of the abdomen and pelvis was performed following the standard protocol without IV contrast. COMPARISON:  CT 09/22/2017 and 06/23/2017 FINDINGS: Lower chest: Probable emphysematous changes at both lung bases with mild linear scarring. No suspicious nodularity or pleural effusion. There is a stable small pericardial effusion. Hepatobiliary: The liver has a stable appearance as evaluated in the noncontrast state. No evidence of gallstones, gallbladder  wall thickening or biliary dilatation. Pancreas: Unremarkable. No pancreatic ductal dilatation or surrounding inflammatory changes. Spleen: Normal in size without focal abnormality. The spleen is rotated posteromedially into the nephrectomy bed Adrenals/Urinary Tract: Stable normal appearance of the right adrenal gland and right kidney. There is no evidence of mass within the left nephrectomy bed. The left adrenal gland is not clearly visualized and may be surgically absent. The urinary bladder appears unremarkable. Stomach/Bowel: No evidence of bowel wall thickening, distention or surrounding inflammatory change. Small bowel protrudes lateral to the descending colon but appears stable. The appendix appears normal. Vascular/Lymphatic: There are no enlarged abdominal or pelvic lymph nodes. Mild aortic and branch vessel atherosclerosis. Reproductive: Hysterectomy.  Stable appearance of the adnexa. Other: Stable postsurgical changes in the anterior abdominal wall without significant  hernia. No ascites or peritoneal nodularity. Musculoskeletal: No acute or significant osseous findings. Postsurgical changes in the pubic bones and lower lumbar spondylosis noted. IMPRESSION: 1. Stable examination. No evidence of local recurrence or metastatic disease status post left nephrectomy and presumed adrenalectomy. 2. The right kidney has a stable appearance. Electronically Signed   By: Richardean Sale M.D.   On: 12/22/2017 16:41    ASSESSMENT: Stage III clear cell renal cell carcinoma of the left kidney  PLAN:    1. Stage III clear cell renal cell carcinoma of the left kidney: Patient's nephrectomy was on February 24, 2017.  Patient could not tolerate 50 mg or a dose reduction of 37.5 mg of sorafenib secondary to worsening diarrhea, vomiting, and abnormal thyroid panel.  Because of this, treatment was discontinued altogether.  CT scan results from December 22, 2017 reviewed independently and report as above with no evidence of recurrent or metastatic disease.  No intervention is needed at this time.  Return to clinic in 6 months with repeat imaging and further evaluation.   2.  Renal insufficiency: Patient's creatinine is only mildly elevated at 1.19.  Monitor.  Patient expressed understanding and was in agreement with this plan. She also understands that She can call clinic at any time with any questions, concerns, or complaints.   Cancer Staging Cancer of left kidney [facility]) Staging form: Kidney, AJCC 8th Edition - Clinical stage from 03/12/2017: Stage III (cT3a, cN0, cM0) - Signed by Lloyd Huger, MD on 03/12/2017   Lloyd Huger, MD   01/11/2018 6:58 AM

## 2018-01-08 DIAGNOSIS — I10 Essential (primary) hypertension: Secondary | ICD-10-CM | POA: Diagnosis not present

## 2018-01-08 DIAGNOSIS — N183 Chronic kidney disease, stage 3 (moderate): Secondary | ICD-10-CM | POA: Diagnosis not present

## 2018-01-08 DIAGNOSIS — N2581 Secondary hyperparathyroidism of renal origin: Secondary | ICD-10-CM | POA: Diagnosis not present

## 2018-01-08 NOTE — Telephone Encounter (Signed)
Routing back to [employer] to continue following up on.

## 2018-01-08 NOTE — Telephone Encounter (Signed)
I didn't get a chance to call today, I do my best to call 01/09/2018.

## 2018-01-09 ENCOUNTER — Inpatient Hospital Stay: Payer: Commercial Managed Care - PPO

## 2018-01-09 ENCOUNTER — Inpatient Hospital Stay: Payer: Commercial Managed Care - PPO | Attending: Oncology | Admitting: Oncology

## 2018-01-09 ENCOUNTER — Encounter: Payer: Self-pay | Admitting: Oncology

## 2018-01-09 ENCOUNTER — Other Ambulatory Visit: Payer: Self-pay

## 2018-01-09 VITALS — BP 113/70 | Wt 163.8 lb

## 2018-01-09 DIAGNOSIS — Z85528 Personal history of other malignant neoplasm of kidney: Secondary | ICD-10-CM

## 2018-01-09 DIAGNOSIS — C642 Malignant neoplasm of left kidney, except renal pelvis: Secondary | ICD-10-CM

## 2018-01-09 DIAGNOSIS — R5383 Other fatigue: Secondary | ICD-10-CM

## 2018-01-09 LAB — CBC WITH DIFFERENTIAL/PLATELET
ABS IMMATURE GRANULOCYTES: 0.02 10*3/uL (ref 0.00–0.07)
BASOS ABS: 0 10*3/uL (ref 0.0–0.1)
BASOS PCT: 1 %
Eosinophils Absolute: 0.4 10*3/uL (ref 0.0–0.5)
Eosinophils Relative: 7 %
HCT: 37.1 % (ref 36.0–46.0)
Hemoglobin: 11.8 g/dL — ABNORMAL LOW (ref 12.0–15.0)
IMMATURE GRANULOCYTES: 0 %
Lymphocytes Relative: 30 %
Lymphs Abs: 1.9 10*3/uL (ref 0.7–4.0)
MCH: 27.7 pg (ref 26.0–34.0)
MCHC: 31.8 g/dL (ref 30.0–36.0)
MCV: 87.1 fL (ref 80.0–100.0)
MONOS PCT: 7 %
Monocytes Absolute: 0.4 10*3/uL (ref 0.1–1.0)
NEUTROS ABS: 3.5 10*3/uL (ref 1.7–7.7)
NEUTROS PCT: 55 %
NRBC: 0 % (ref 0.0–0.2)
PLATELETS: 263 10*3/uL (ref 150–400)
RBC: 4.26 MIL/uL (ref 3.87–5.11)
RDW: 13.2 % (ref 11.5–15.5)
WBC: 6.4 10*3/uL (ref 4.0–10.5)

## 2018-01-09 LAB — COMPREHENSIVE METABOLIC PANEL
ALBUMIN: 4.4 g/dL (ref 3.5–5.0)
ALT: 19 U/L (ref 0–44)
ANION GAP: 7 (ref 5–15)
AST: 19 U/L (ref 15–41)
Alkaline Phosphatase: 39 U/L (ref 38–126)
BUN: 28 mg/dL — ABNORMAL HIGH (ref 8–23)
CO2: 28 mmol/L (ref 22–32)
Calcium: 9.6 mg/dL (ref 8.9–10.3)
Chloride: 104 mmol/L (ref 98–111)
Creatinine, Ser: 1.19 mg/dL — ABNORMAL HIGH (ref 0.44–1.00)
GFR calc Af Amer: 56 mL/min — ABNORMAL LOW (ref >60)
GFR calc non Af Amer: 49 mL/min — ABNORMAL LOW (ref >60)
GLUCOSE: 98 mg/dL (ref 70–99)
POTASSIUM: 3.9 mmol/L (ref 3.5–5.1)
SODIUM: 139 mmol/L (ref 135–145)
TOTAL PROTEIN: 7.3 g/dL (ref 6.5–8.1)
Total Bilirubin: 0.7 mg/dL (ref 0.3–1.2)

## 2018-01-09 LAB — MAGNESIUM: Magnesium: 2.3 mg/dL (ref 1.7–2.4)

## 2018-01-09 LAB — PHOSPHORUS: PHOSPHORUS: 3.2 mg/dL (ref 2.5–4.6)

## 2018-01-09 NOTE — Telephone Encounter (Signed)
TS please advise once able to, thank you.

## 2018-01-09 NOTE — Progress Notes (Signed)
Patient here today for follow up regarding kidney cancer, CT results. Patient reports fatigue, denies other concerns.

## 2018-01-09 NOTE — Telephone Encounter (Signed)
Called [employer], they gave me A key: [id number], I submitted the P/A thru [employer].. It was approved. Waiting for fax with the ref# and effective dates.

## 2018-01-10 ENCOUNTER — Other Ambulatory Visit: Payer: Commercial Managed Care - PPO

## 2018-01-10 ENCOUNTER — Ambulatory Visit: Payer: Commercial Managed Care - PPO | Admitting: Oncology

## 2018-01-10 LAB — THYROID PANEL WITH TSH
Free Thyroxine Index: 2.3 (ref 1.2–4.9)
T3 UPTAKE RATIO: 25 % (ref 24–39)
T4 TOTAL: 9.3 ug/dL (ref 4.5–12.0)
TSH: 3.05 u[IU]/mL (ref 0.450–4.500)

## 2018-01-10 NOTE — Telephone Encounter (Signed)
Fax was sent to [facility] office. I asked rep to fax it to [place] as well. Gave her our fax #, she faxed it while I WAS ON THE PHONE WITH HER. Ref#: [id number], Eff. Dates:01/09/2018 to 07/08/2018. [facility] To order Nucala. Then I'll call the pt. And let her know her Geradine Girt has been approved. Called pt lmom.

## 2018-01-12 DIAGNOSIS — N183 Chronic kidney disease, stage 3 (moderate): Secondary | ICD-10-CM | POA: Diagnosis not present

## 2018-01-12 DIAGNOSIS — N2581 Secondary hyperparathyroidism of renal origin: Secondary | ICD-10-CM | POA: Diagnosis not present

## 2018-01-12 DIAGNOSIS — I10 Essential (primary) hypertension: Secondary | ICD-10-CM | POA: Diagnosis not present

## 2018-01-12 NOTE — Telephone Encounter (Signed)
I called in a verbal rx for Nucala. As soon as [facility] is processed [facility].. Since I called in the auto injector.  I Skyped Christine to see if Alva Garnet had mentioned to the pt whether he wants her to get her injs. At home or in our office, since pt lives in [place]. Alva Garnet is out of the office.  Called pt, she said she had rather get the nucala in our office. I called the pharmacy and had them change the rx to SDV instead of the auto injector. Done.

## 2018-01-12 NOTE — Telephone Encounter (Signed)
[facility] to set up delivery.

## 2018-01-15 ENCOUNTER — Ambulatory Visit (INDEPENDENT_AMBULATORY_CARE_PROVIDER_SITE_OTHER): Payer: Commercial Managed Care - PPO | Admitting: Obstetrics & Gynecology

## 2018-01-15 ENCOUNTER — Other Ambulatory Visit (HOSPITAL_COMMUNITY)
Admission: RE | Admit: 2018-01-15 | Discharge: 2018-01-15 | Disposition: A | Discharge status: Home / Self Care | Payer: Commercial Managed Care - PPO | Source: Ambulatory Visit | Attending: Psychology | Admitting: Psychology

## 2018-01-15 ENCOUNTER — Encounter: Payer: Self-pay | Admitting: Obstetrics & Gynecology

## 2018-01-15 VITALS — BP 110/60 | Ht 66.0 in | Wt 159.0 lb

## 2018-01-15 DIAGNOSIS — Z01419 Encounter for gynecological examination (general) (routine) without abnormal findings: Secondary | ICD-10-CM | POA: Diagnosis not present

## 2018-01-15 DIAGNOSIS — Z1211 Encounter for screening for malignant neoplasm of colon: Secondary | ICD-10-CM

## 2018-01-15 DIAGNOSIS — Z1239 Encounter for other screening for malignant neoplasm of breast: Secondary | ICD-10-CM

## 2018-01-15 DIAGNOSIS — Z1272 Encounter for screening for malignant neoplasm of vagina: Secondary | ICD-10-CM | POA: Insufficient documentation

## 2018-01-15 NOTE — Patient Instructions (Signed)
PAP every 3-5 years Mammogram every year    Call [phone] to schedule at [facility] Colonoscopy every 10 years Labs yearly (with PCP)

## 2018-01-15 NOTE — Progress Notes (Signed)
HPI:      [name] is a 63 y.o. [phone] who LMP was in the past s/p hysterectomy, she presents today for her annual examination.  The patient has been treated for kidney cancer this past year.  No vasomotor sx's.  No bleeding.  No pain.  Some incontinence this year has resumed.  Prior sling w hysterectomy years ago.  The patient is sexually active. Herlast pap: approximate date (years ago) and was normal and last mammogram: approximate date (several years ago) and was normal.  The patient does perform self breast exams.  There is no notable family history of breast or ovarian cancer in her family. The patient is taking hormone replacement therapy on an every other day fashion for insomnia. Patient denies post-menopausal vaginal bleeding.   The patient has regular exercise: yes. The patient denies current symptoms of depression.    GYN Hx: Last Colonoscopy:several years ago. Normal.  Last DEXA: never ago.    PMHx: Past Medical History:  Diagnosis Date  . Asthma   . Complication of anesthesia   . GERD (gastroesophageal reflux disease)   . Headache    occular HA  . Heart murmur   . History of hiatal hernia   . Hypertension   . Irregular heart beat    pac's and pvc's  . Pneumonia    11-2016  . PONV (postoperative nausea and vomiting)   . Renal cell cancer, left [facility])    Past Surgical History:  Procedure Laterality Date  . ABDOMINAL HYSTERECTOMY    . ABLATION     prior to hysterectomy  . CORONARY ANGIOPLASTY    . CYSTOSCOPY/RETROGRADE/URETEROSCOPY     01-20-17 Dr. Junious Silk  . CYSTOSCOPY/RETROGRADE/URETEROSCOPY Left 01/20/2017   Procedure: CYSTOSCOPY/RETROGRADE/URETEROSCOPY/ BIOPSY,BLADDER BIOPSY PLACEMENT STENT LEFT URETER;  Surgeon: Festus Aloe, MD;  Location: [facility];  Service: Urology;  Laterality: Left;  . DILATION AND CURETTAGE OF UTERUS    . NASAL ENDOSCOPY WITH EPISTAXIS CONTROL    . ROBOTIC ASSITED PARTIAL NEPHRECTOMY Left 02/24/2017   Procedure: XI ROBOTIC  ASSITED LEFT  RADICAL NEPHRECTOMY;  Surgeon: Alexis Frock, MD;  Location: [facility];  Service: Urology;  Laterality: Left;  . TUBAL LIGATION     Family History  Problem Relation Age of Onset  . Hypertension Brother    Social History   Tobacco Use  . Smoking status: Never Smoker  . Smokeless tobacco: Never Used  Substance Use Topics  . Alcohol use: No  . Drug use: No    Current Outpatient Medications:  .  ALPRAZolam (XANAX) 0.25 MG tablet, Take 0.25 mg by mouth at bedtime as needed for anxiety. Anxiety., Disp: , Rfl: 5 .  aspirin EC 81 MG tablet, Take 81 mg by mouth daily., Disp: , Rfl:  .  Benralizumab (FASENRA) 30 MG/ML SOSY, Inject 30 mg into the skin every 30 (thirty) days., Disp: 3 mL, Rfl: 0 .  calcitRIOL (ROCALTROL) 0.25 MCG capsule, , Disp: , Rfl:  .  Cholecalciferol (VITAMIN D3) 1000 units CAPS, Take 1 capsule by mouth daily., Disp: , Rfl:  .  ciclopirox (PENLAC) 8 % solution, , Disp: , Rfl:  .  estradiol (ESTRACE) 2 MG tablet, Take 2 mg by mouth daily., Disp: , Rfl: 6 .  Fluticasone-Salmeterol (ADVAIR DISKUS) 500-50 MCG/DOSE AEPB, Inhale 1 puff into the lungs 2 (two) times daily., Disp: 60 each, Rfl: 5 .  levalbuterol (XOPENEX HFA) 45 MCG/ACT inhaler, , Disp: , Rfl:  .  montelukast (SINGULAIR) 10 MG tablet, Take 10  mg by mouth daily., Disp: , Rfl:  .  olmesartan (BENICAR) 20 MG tablet, Take 20 mg by mouth daily., Disp: , Rfl:  .  pantoprazole (PROTONIX) 40 MG tablet, Take 40 mg by mouth 2 (two) times daily., Disp: , Rfl:  .  potassium chloride (MICRO-K) 10 MEQ CR capsule, Take 20 mEq by mouth daily., Disp: , Rfl: 11 .  ranitidine (ZANTAC) 75 MG tablet, Take 75 mg by mouth 2 (two) times daily., Disp: , Rfl:  .  sucralfate (CARAFATE) 1 G tablet, Take 1 g by mouth 2 (two) times daily. , Disp: , Rfl:  .  triamterene-hydrochlorothiazide (DYAZIDE) 37.5-25 MG per capsule, Take 1 capsule by mouth daily., Disp: , Rfl:  Allergies: Ciprofloxacin and Steri-strip compound benzoin  [benzoin compound]  Review of Systems  Constitutional: Positive for malaise/fatigue. Negative for chills and fever.  HENT: Negative for congestion, sinus pain and sore throat.   Eyes: Negative for blurred vision and pain.  Respiratory: Negative for cough and wheezing.   Cardiovascular: Negative for chest pain and leg swelling.  Gastrointestinal: Positive for constipation. Negative for abdominal pain, diarrhea, heartburn, nausea and vomiting.  Genitourinary: Negative for dysuria, frequency, hematuria and urgency.  Musculoskeletal: Negative for back pain, joint pain, myalgias and neck pain.  Skin: Negative for itching and rash.  Neurological: Negative for dizziness, tremors and weakness.  Endo/Heme/Allergies: Does not bruise/bleed easily.  Psychiatric/Behavioral: Negative for depression. The patient is not nervous/anxious and does not have insomnia.     Objective: BP 110/60   Ht 5\' 6"  (1.676 m)   Wt 159 lb (72.1 kg)   LMP  (LMP Unknown)   BMI 25.66 kg/m   Filed Weights   01/15/18 1345  Weight: 159 lb (72.1 kg)   Body mass index is 25.66 kg/m. Physical Exam Constitutional:      General: She is not in acute distress.    Appearance: She is well-developed.  Genitourinary:     Pelvic exam was performed with patient supine.     Vagina and rectum normal.     No lesions in the vagina.     No vaginal bleeding.     No right or left adnexal mass present.     Right adnexa not tender.     Left adnexa not tender.     Genitourinary Comments: Absent Uterus Absent cervix Vaginal cuff well healed Min cystocele  HENT:     Head: Normocephalic and atraumatic. No laceration.     Right Ear: Hearing normal.     Left Ear: Hearing normal.     Mouth/Throat:     Pharynx: Uvula midline.  Eyes:     Pupils: Pupils are equal, round, and reactive to light.  Neck:     Musculoskeletal: Normal range of motion and neck supple.     Thyroid: No thyromegaly.  Cardiovascular:     Rate and Rhythm:  Normal rate and regular rhythm.     Heart sounds: No murmur. No friction rub. No gallop.   Pulmonary:     Effort: Pulmonary effort is normal. No respiratory distress.     Breath sounds: Normal breath sounds. No wheezing.  Chest:     Breasts:        Right: No mass, skin change or tenderness.        Left: No mass, skin change or tenderness.  Abdominal:     General: Bowel sounds are normal. There is no distension.     Palpations: Abdomen is soft.  Tenderness: There is no abdominal tenderness. There is no rebound.  Musculoskeletal: Normal range of motion.  Neurological:     Mental Status: She is alert and oriented to person, place, and time.     Cranial Nerves: No cranial nerve deficit.  Skin:    General: Skin is warm and dry.  Psychiatric:        Judgment: Judgment normal.  Vitals signs reviewed.     Assessment: Annual Exam 1. Women's annual routine gynecological examination   2. Screening for breast cancer   3. Screening for vaginal cancer   4. Screen for colon cancer     Plan:            1.  Vaginal Screening-  Pap smear done today  2. Breast screening- Exam annually and mammogram scheduled  3. Colonoscopy every 10 years, Hemoccult testing after age 44  4. Labs managed by PCP  5. Counseling for hormonal therapy: wants to stop due to not of much benefit for what she is taking it for (insomnia)  6. GSI, monitor for worsening. Kegels.  Prior surgery, may consider urology referral.  Normal exam today.     F/U  Return in about 1 year (around 01/16/2019) for Annual.  Barnett Applebaum, MD, [facility], [facility] 01/15/2018  2:23 PM

## 2018-01-18 LAB — CYTOLOGY - PAP
Diagnosis: NEGATIVE
HPV (WINDOPATH): NOT DETECTED

## 2018-01-18 NOTE — Telephone Encounter (Signed)
[facility] to [facility] pt enrollment before we can order her Nucala. [facility].. I'll cb 01/18/2018 pm.

## 2018-01-22 NOTE — Telephone Encounter (Signed)
Tammy please advise, thank you.  

## 2018-01-23 NOTE — Telephone Encounter (Signed)
Will route to Tammy. Waiting on documentation and update.

## 2018-01-24 NOTE — Telephone Encounter (Signed)
[facility] would like to know when to deliver the Nucala injection.  Need a delivery date.  Phone number is [phone]. Amy is the contact person.

## 2018-01-24 NOTE — Telephone Encounter (Signed)
Called [facility] at number provided; was placed on hold for 20 minutes and then told the wait time was over 30 more minutes. Heidi Torres will call back tomorrow morning to schedule shipment of medication.

## 2018-01-25 NOTE — Telephone Encounter (Signed)
Called pt. Left message, her nucala is being del. To [place] tomorrow. Will wait to hear from pt a/b Epi-pen and to make her 1st Nucala inj. Appt.Marland Kitchen

## 2018-01-25 NOTE — Telephone Encounter (Signed)
Spoke to [place] at [facility] in regards to delivery of patient's Nucala. Gave her the address of [address], [place], [place] [address] and to deliver to the attn: [employer]. This will be delivered tomorrow.

## 2018-01-25 NOTE — Telephone Encounter (Signed)
Message routed to Tammy S, to follow up. 

## 2018-01-29 NOTE — Telephone Encounter (Signed)
Spoke with pt, she is going to be out of town for the next wk or 2. Pt. Is going to call me back and let me know when she can come in so I can sch her appt. And call in her Epi-pen. Waiting for pt to call.

## 2018-01-29 NOTE — Telephone Encounter (Signed)
Will route to Tammy S, for follow up

## 2018-02-05 NOTE — Telephone Encounter (Signed)
Waiting for Patient to call Tammy S back

## 2018-02-06 NOTE — Telephone Encounter (Signed)
Will await call from patient and documentation from TS.

## 2018-02-09 NOTE — Telephone Encounter (Signed)
TS please advise on update once available. Thank you.

## 2018-02-12 NOTE — Telephone Encounter (Signed)
Routed to [employer]

## 2018-02-12 NOTE — Telephone Encounter (Signed)
Called pt, vm box is full, couldn't leave a message.

## 2018-02-13 NOTE — Telephone Encounter (Signed)
TS please advise once able to speak with patient, thank you.

## 2018-02-14 NOTE — Telephone Encounter (Signed)
Waiting on response from [place]. Will update once heard back from patient.

## 2018-02-19 NOTE — Telephone Encounter (Signed)
Heidi Torres, any update?  Patient should return this week.

## 2018-02-19 NOTE — Telephone Encounter (Signed)
After 2 calls I was finally able to reach pt.. She is seeing her GP 02/20/2018,she is wheezing and coughing. She thinks she got sick from the guy behind her on the plane. "He was coughing and blowing something terrible." (No mask) (Pt went to [place]) The wheezing and coughing are the reason she hasn't called to schedule an appt. This has been going on since last Wed.. I'm waiting to hear from pt 02/20/2018. She said she would call me after she's seen her GP, to let us know what he said about starting Nucala.

## 2018-02-20 ENCOUNTER — Ambulatory Visit: Payer: Commercial Managed Care - PPO

## 2018-02-20 DIAGNOSIS — J45909 Unspecified asthma, uncomplicated: Secondary | ICD-10-CM | POA: Diagnosis not present

## 2018-02-20 IMAGING — MR MR ABDOMEN WO/W CM
17 series · 48 of 48 positions shown · IV contrast (multihance)
Comparison: CT abdomen dated 12/15/2016.

CLINICAL DATA: Follow-up left renal lesion on CT

EXAM:
MRI ABDOMEN WITHOUT AND WITH CONTRAST
TECHNIQUE: Multiplanar multisequence MR imaging of the abdomen was performed
both before and after the administration of intravenous contrast.
CONTRAST:  14mL MULTIHANCE GADOBENATE DIMEGLUMINE 529 MG/ML IV SOLN
Creatinine was obtained on site at [HOSPITAL] at [HOSPITAL].
Results: Creatinine 0.7 mg/dL.

[Series 3: T2 · coronal · 5.0mm · 1.56mm/px · 2 of 36 slices shown (1 of 3)]
[im 1/36]
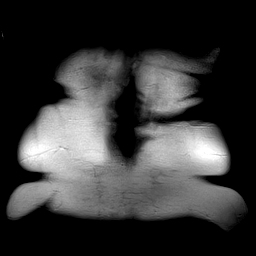
[im 36/36]
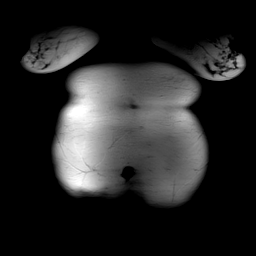

[Series 4: T1 · axial · 3.0mm · 1.19mm/px · z∈[-76,+137]mm · 6 of 144 slices shown]
[im 1/144]
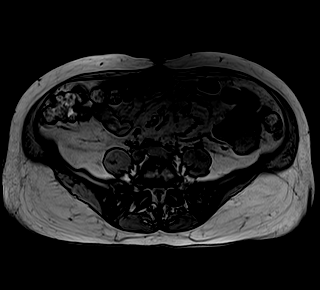
[im 29/144]
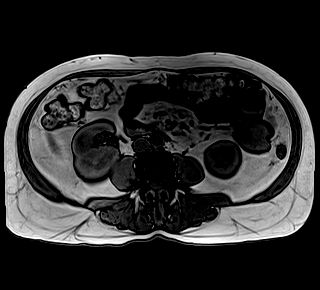
[im 58/144]
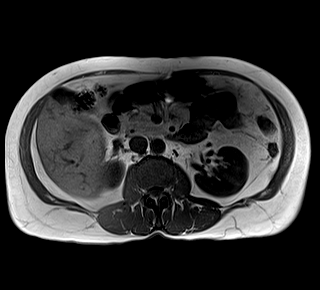
[im 86/144]
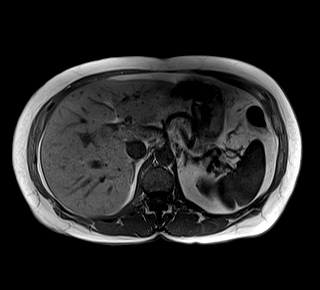
[im 115/144]
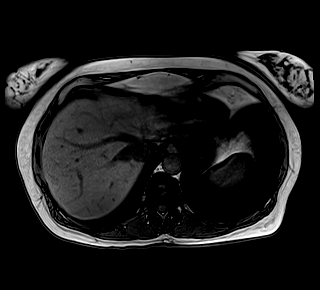
[im 144/144]
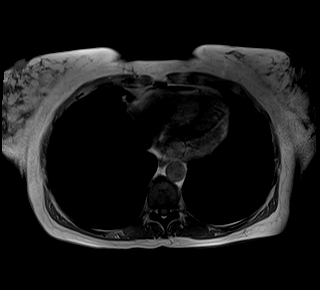

[Series 5: T2 · axial · 5.0mm · 1.48mm/px · z∈[-93,+165]mm · 2 of 44 slices shown (2 of 3)]
[im 1/44]
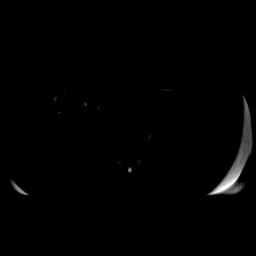
[im 44/44]
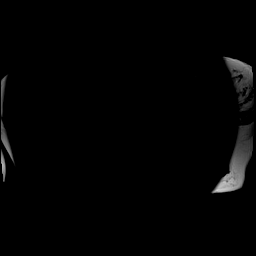

[Series 6: DWI · axial · 5.0mm · 1.42mm/px · z∈[-75,+147]mm · 4 of 114 slices shown (1 of 2)]
[im 1/114]
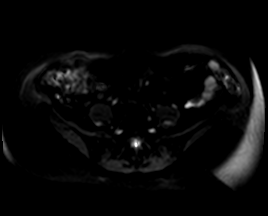
[im 38/114]
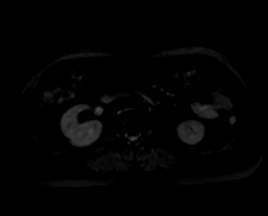
[im 76/114]
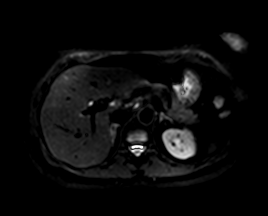
[im 114/114]
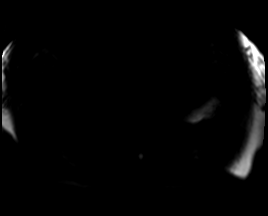

[Series 7: DWI · axial · 5.0mm · 1.42mm/px · 1 of 38 slices shown (2 of 2)]
[im 1/38]
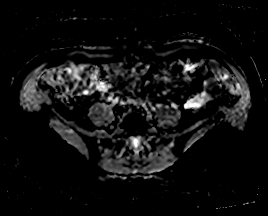

[Series 8: T2 · axial · 6.0mm · 1.22mm/px · 1 of 34 slices shown (3 of 3)]
[im 1/34]
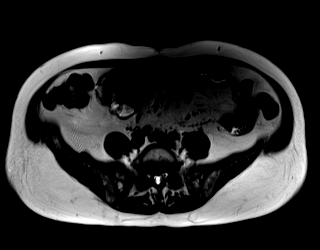

[Series 9: bSSFP · axial · 5.0mm · 1.25mm/px · z∈[-82,+152]mm · 2 of 40 slices shown]
[im 1/40]
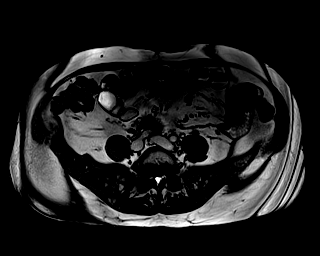
[im 40/40]
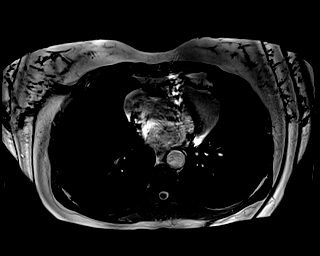

[Series 10: T1 dynamic · axial · non-contrast · 3.0mm · 1.25mm/px · z∈[-81,+156]mm · 3 of 80 slices shown]
[im 1/80]
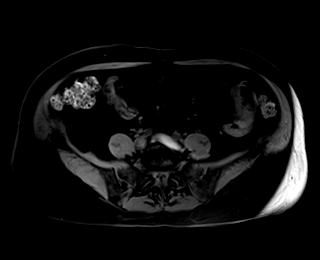
[im 40/80]
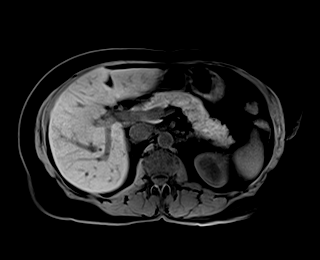
[im 80/80]
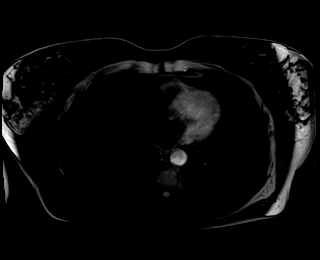

[Series 11: T1 dynamic post-contrast · axial · 3.0mm · 1.25mm/px · z∈[-81,+156]mm · 3 of 80 slices shown (1 of 9)]
[im 1/80]
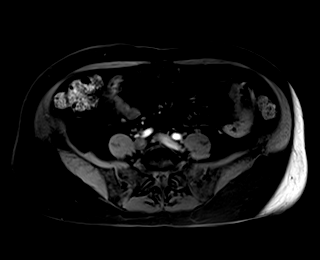
[im 40/80]
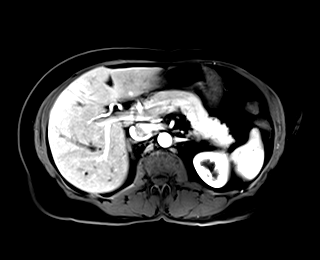
[im 80/80]
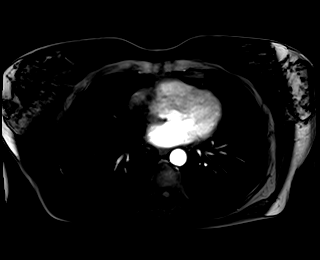

[Series 12: T1 dynamic post-contrast · axial · 3.0mm · 1.25mm/px · z∈[-81,+156]mm · 3 of 80 slices shown (2 of 9)]
[im 1/80]
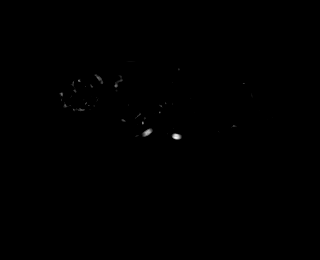
[im 40/80]
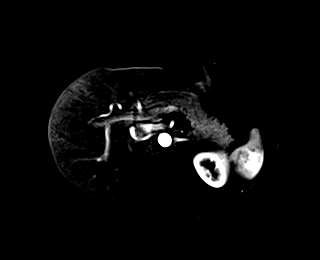
[im 80/80]
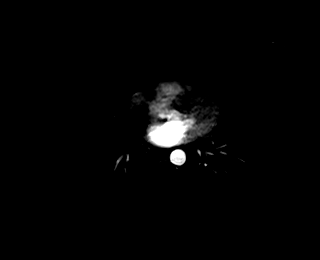

[Series 13: T1 dynamic post-contrast · axial · 3.0mm · 1.25mm/px · z∈[-81,+156]mm · 3 of 80 slices shown (3 of 9)]
[im 1/80]
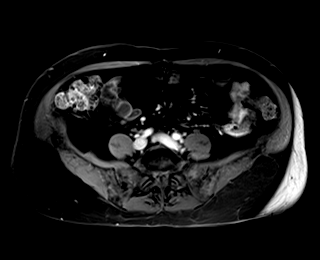
[im 40/80]
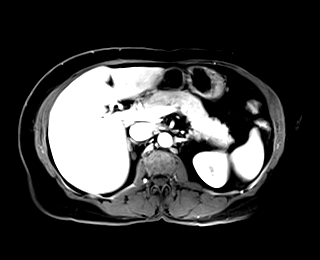
[im 80/80]
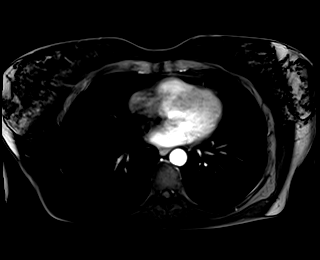

[Series 14: T1 dynamic post-contrast · axial · 3.0mm · 1.25mm/px · z∈[-81,+156]mm · 3 of 80 slices shown (4 of 9)]
[im 1/80]
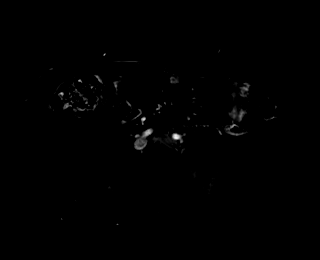
[im 40/80]
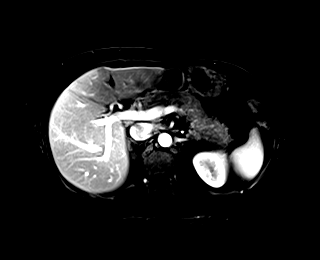
[im 80/80]
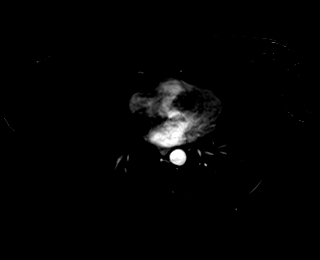

[Series 15: T1 dynamic post-contrast · axial · 3.0mm · 1.25mm/px · z∈[-81,+156]mm · 3 of 80 slices shown (5 of 9)]
[im 1/80]
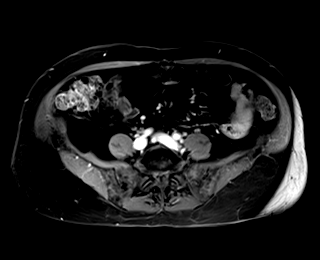
[im 40/80]
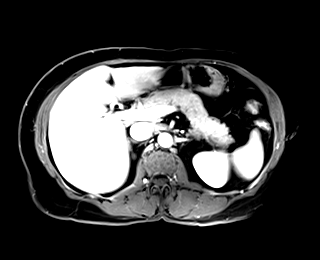
[im 80/80]
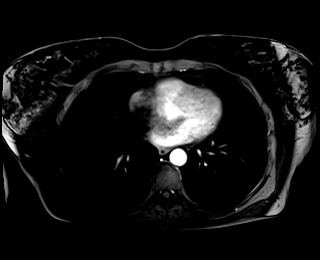

[Series 16: T1 dynamic post-contrast · axial · 3.0mm · 1.25mm/px · z∈[-81,+156]mm · 3 of 80 slices shown (6 of 9)]
[im 1/80]
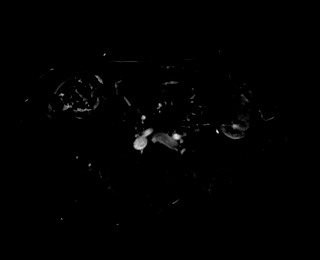
[im 40/80]
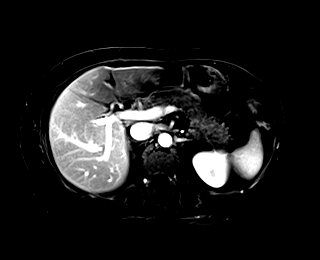
[im 80/80]
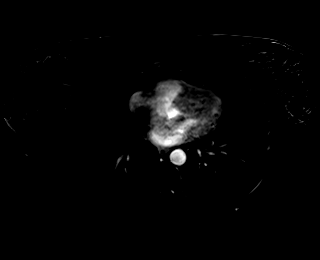

[Series 17: T1 dynamic post-contrast · coronal · 3.0mm · 1.25mm/px · 3 of 72 slices shown (7 of 9)]
[im 1/72]
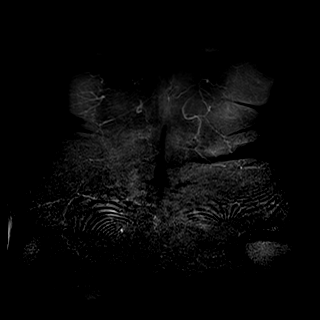
[im 36/72]
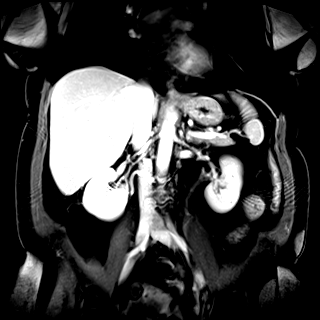
[im 72/72]
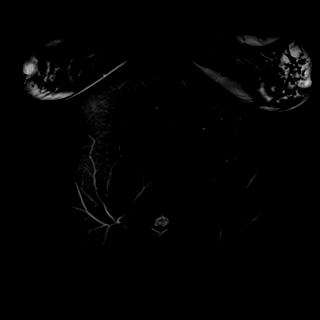

[Series 18: T1 dynamic post-contrast · axial · 3.0mm · 1.25mm/px · z∈[-81,+156]mm · 3 of 80 slices shown (8 of 9)]
[im 1/80]
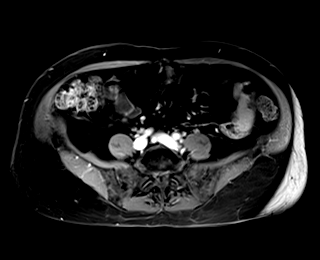
[im 40/80]
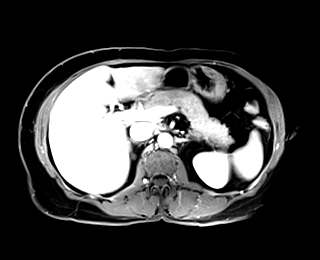
[im 80/80]
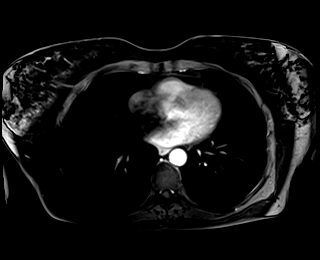

[Series 19: T1 dynamic post-contrast · axial · 3.0mm · 1.25mm/px · z∈[-81,+156]mm · 3 of 80 slices shown (9 of 9)]
[im 1/80]
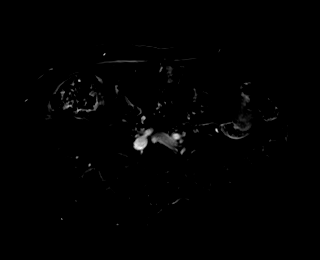
[im 40/80]
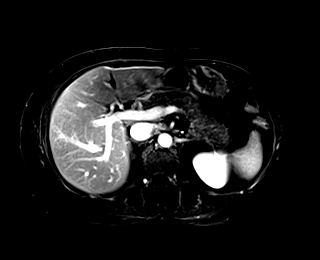
[im 80/80]
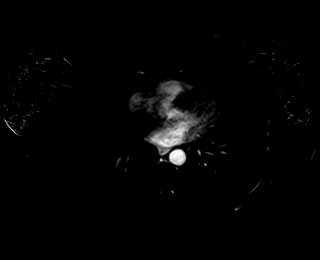

[48 of 48 positions shown; findings below may reference images not displayed]

FINDINGS: Lower chest: Lung bases are clear.

Hepatobiliary: Liver is within normal limits.

Gallbladder is unremarkable. No intrahepatic or extrahepatic duct
dilatation.

Pancreas:  Within normal limits.

Spleen:  Within normal limits.

Adrenals/Urinary Tract:  Adrenal glands are within normal limits.

1.7 x 1.8 x 1.7 cm exophytic solid mass along the medial left lower
pole (series 5/ image 24), extending into the renal sinus. The mass
progressively enhances following contrast administration (series 16/
image 56). This appearance is compatible with solid renal neoplasm
such as renal cell carcinoma.

Right kidney is within normal limits.  No hydronephrosis.

Stomach/Bowel: Stomach is within normal limits.

Visualized bowel is unremarkable.

Vascular/Lymphatic:  No evidence of abdominal aortic aneurysm.

Single left renal artery and vein.  No renal vein invasion.

No suspicious abdominal lymphadenopathy.

Other:  No abdominal ascites.

Musculoskeletal: No focal osseous lesions.
IMPRESSION: 1.8 cm enhancing solid mass along the medial left lower kidney, as
described above, compatible with solid renal neoplasm such as renal
cell carcinoma.

Single left renal artery and vein. No renal vein invasion. No
regional lymphadenopathy.

## 2018-02-21 ENCOUNTER — Telehealth: Payer: Self-pay | Admitting: Pulmonary Disease

## 2018-02-21 NOTE — Telephone Encounter (Signed)
It is OK to get started on Nucala  Thanks  Dr Alva Garnet

## 2018-02-21 NOTE — Telephone Encounter (Signed)
LM on VM for patient that she can start Nucala and get the PFT. She is to call with any questions.

## 2018-02-21 NOTE — Telephone Encounter (Signed)
Spoke to patient, she stated that she r/s her PFT due to being sick. She r/s for 03/13/18. She also wanted to know if she is to get her Nucala before the PFT or after. Will confirm with Dr. Alva Garnet and let her know.

## 2018-02-27 DIAGNOSIS — M4302 Spondylolysis, cervical region: Secondary | ICD-10-CM | POA: Diagnosis not present

## 2018-02-27 DIAGNOSIS — M9901 Segmental and somatic dysfunction of cervical region: Secondary | ICD-10-CM | POA: Diagnosis not present

## 2018-02-27 NOTE — Telephone Encounter (Signed)
Message routed to [place] for updates

## 2018-03-01 ENCOUNTER — Ambulatory Visit: Payer: Commercial Managed Care - PPO | Admitting: Pulmonary Disease

## 2018-03-01 DIAGNOSIS — M4302 Spondylolysis, cervical region: Secondary | ICD-10-CM | POA: Diagnosis not present

## 2018-03-01 DIAGNOSIS — M9901 Segmental and somatic dysfunction of cervical region: Secondary | ICD-10-CM | POA: Diagnosis not present

## 2018-03-05 DIAGNOSIS — M4302 Spondylolysis, cervical region: Secondary | ICD-10-CM | POA: Diagnosis not present

## 2018-03-05 DIAGNOSIS — M9901 Segmental and somatic dysfunction of cervical region: Secondary | ICD-10-CM | POA: Diagnosis not present

## 2018-03-07 NOTE — Telephone Encounter (Signed)
Heidi Torres- I called and spoke with the pt to f/u  She states that she is feeling much improved and is ready to start Nucala inj  She states her PCP had never heard if the med, but did not onject to her starting  Please f/u with her on getting started on this thanks

## 2018-03-08 ENCOUNTER — Telehealth: Payer: Self-pay | Admitting: Pulmonary Disease

## 2018-03-08 DIAGNOSIS — M4302 Spondylolysis, cervical region: Secondary | ICD-10-CM | POA: Diagnosis not present

## 2018-03-08 DIAGNOSIS — M9901 Segmental and somatic dysfunction of cervical region: Secondary | ICD-10-CM | POA: Diagnosis not present

## 2018-03-08 DIAGNOSIS — J4550 Severe persistent asthma, uncomplicated: Secondary | ICD-10-CM

## 2018-03-08 MED ORDER — EPINEPHRINE 0.3 MG/0.3ML IJ SOAJ: 0.3000 mg | Freq: Once | INTRAMUSCULAR | 11 refills | Status: AC

## 2018-03-08 NOTE — Telephone Encounter (Signed)
Rx sent 

## 2018-03-08 NOTE — Telephone Encounter (Signed)
Called pt, she is coming in for her first Nucala appt. 03/09/2018. Pt is aware of 2 hr. Wait and to bring her Epi-Pen with her and let me see it. Nothing further needed.

## 2018-03-09 ENCOUNTER — Ambulatory Visit (INDEPENDENT_AMBULATORY_CARE_PROVIDER_SITE_OTHER): Payer: Commercial Managed Care - PPO

## 2018-03-09 DIAGNOSIS — J4550 Severe persistent asthma, uncomplicated: Secondary | ICD-10-CM

## 2018-03-09 MED ORDER — MEPOLIZUMAB 100 MG ~~LOC~~ SOLR
100.0000 mg | Freq: Once | SUBCUTANEOUS | Status: AC
  Administered 2018-03-09: 100 mg via SUBCUTANEOUS

## 2018-03-13 ENCOUNTER — Ambulatory Visit: Payer: Commercial Managed Care - PPO

## 2018-03-15 DIAGNOSIS — M9901 Segmental and somatic dysfunction of cervical region: Secondary | ICD-10-CM | POA: Diagnosis not present

## 2018-03-15 DIAGNOSIS — M4302 Spondylolysis, cervical region: Secondary | ICD-10-CM | POA: Diagnosis not present

## 2018-03-19 ENCOUNTER — Telehealth: Payer: Self-pay | Admitting: Pulmonary Disease

## 2018-03-19 NOTE — Telephone Encounter (Signed)
Called and spoke to pt. Pt is requesting Rx for albuterol nebulizer solution.  Per our records, it does not appear that Dr. Alva Garnet has ordered neb solution previously only xopenex HFA.  [facility].  SD please advise. Thanks

## 2018-03-21 MED ORDER — LEVALBUTEROL HCL 0.63 MG/3ML IN NEBU: 0.6300 mg | INHALATION_SOLUTION | RESPIRATORY_TRACT | 5 refills | Status: DC | PRN

## 2018-03-21 NOTE — Telephone Encounter (Signed)
Pt is aware of below recommendations and voiced her understanding. Nothing further is needed.  

## 2018-03-21 NOTE — Telephone Encounter (Signed)
I have placed order for 10 doses of Xopenex neb solution. This is to be used only as needed if inhaler fails to provide sufficient relief of SOB, chest tightness, wheezing  Thanks  Waunita Schooner

## 2018-03-28 MED ORDER — MEPOLIZUMAB 100 MG ~~LOC~~ SOLR
100.0000 mg | Freq: Once | SUBCUTANEOUS | Status: AC
  Administered 2018-03-09: 100 mg via SUBCUTANEOUS

## 2018-03-28 NOTE — Progress Notes (Addendum)
Charge was in the system. I deleted the older Med. And an inj. Charge and put charges in today. Hope it takes this time.  Patient answered the following questions per the hand-written documentation on the Yes Injection sheet:  Have you had a change in your insurance?  No. Have you been in the hospital in the past 10 days?  No. Do you have a fever?  NO. Do you have a cough?  A Little productive. Pt. always has this cough. That's why She's taking Nucala. ([place]

## 2018-03-29 ENCOUNTER — Telehealth: Payer: Self-pay | Admitting: Pulmonary Disease

## 2018-03-29 NOTE — Telephone Encounter (Signed)
1 Vial Order Date: 03/29/2018 Shipping Date: 04/05/2018  [facility] Is going to [facility] pt about co-pay.

## 2018-04-06 NOTE — Telephone Encounter (Signed)
1 vial Arrival Date:04/06/2018 Lot #: [id number] Exp Date: 09/2021  R'cd 04/06/2018

## 2018-04-09 ENCOUNTER — Other Ambulatory Visit: Payer: Commercial Managed Care - PPO

## 2018-04-09 ENCOUNTER — Telehealth: Payer: Self-pay | Admitting: Pulmonary Disease

## 2018-04-09 ENCOUNTER — Ambulatory Visit: Payer: Commercial Managed Care - PPO | Admitting: Oncology

## 2018-04-09 NOTE — Telephone Encounter (Signed)
LVM for patient to return call regarding Nucala injection. X1  Spoke with supervisor, NO injections are to be given in the parking lot at all Pt is to come in the office into the injection room for her injection per supervisor.

## 2018-04-09 NOTE — Telephone Encounter (Signed)
Pt doesn't want to come in the building for her Nucala shot. She is a kidney transplant pt, a cancer pt, and of course asthma pt..  Pt has not been out of the house for 2 and half wks. If they need any thing they order it online at [facility]. (curbside service)  Pt is not sick and she doesn't want to get sick. Pt is requesting I give her, her shot in the parking lot. I explained to her the procedure as you come in. She doesn't want to come in the office period. Please advise.

## 2018-04-10 NOTE — Telephone Encounter (Signed)
Left voicemail to call us.

## 2018-04-10 NOTE — Telephone Encounter (Signed)
Pt decided to keep her appt.. She said she would come a few mins. Early. I told her I go to lunch from 12:00 to 1:00, that I would come & get her as soon as I could. Nothing further needed.

## 2018-04-10 NOTE — Telephone Encounter (Signed)
Pt is returning call. Cb is [phone].

## 2018-04-10 NOTE — Telephone Encounter (Signed)
Patient is aware of this policy.She would like to know if she can move up here appointment time.

## 2018-04-12 ENCOUNTER — Other Ambulatory Visit: Payer: Self-pay

## 2018-04-12 ENCOUNTER — Ambulatory Visit (INDEPENDENT_AMBULATORY_CARE_PROVIDER_SITE_OTHER): Payer: Commercial Managed Care - PPO

## 2018-04-12 DIAGNOSIS — J4550 Severe persistent asthma, uncomplicated: Secondary | ICD-10-CM | POA: Diagnosis not present

## 2018-04-12 MED ORDER — MEPOLIZUMAB 100 MG ~~LOC~~ SOLR
100.0000 mg | Freq: Once | SUBCUTANEOUS | Status: AC
  Administered 2018-04-12: 100 mg via SUBCUTANEOUS

## 2018-04-12 NOTE — Progress Notes (Signed)
Have you been hospitalized within the last 10 days?  No Do you have a fever?  No Do you have a cough?  No Do you have a headache or sore throat? No  

## 2018-04-19 ENCOUNTER — Encounter: Payer: Self-pay | Admitting: Pulmonary Disease

## 2018-05-01 ENCOUNTER — Telehealth: Payer: Self-pay | Admitting: Pulmonary Disease

## 2018-05-01 NOTE — Telephone Encounter (Signed)
Nucala Order: 100mg  #1 Vial Order Date: 05/01/2018 Expected shipping Date: 05/03/2018 Ordered by: Desmond Dike, [place]: [facility]

## 2018-05-03 NOTE — Telephone Encounter (Signed)
Nucala Shipment Received: 100mg  #1 vial Medication arrival date:05/03/2018 Lot #: [id number] Medication exp date:10/2021 Received by: TBS

## 2018-05-17 ENCOUNTER — Other Ambulatory Visit: Payer: Self-pay | Admitting: Pulmonary Disease

## 2018-05-17 MED ORDER — FLUTICASONE-SALMETEROL 500-50 MCG/DOSE IN AEPB: 1.0000 | INHALATION_SPRAY | Freq: Two times a day (BID) | RESPIRATORY_TRACT | 0 refills | Status: DC

## 2018-05-23 ENCOUNTER — Ambulatory Visit: Payer: Commercial Managed Care - PPO

## 2018-05-23 ENCOUNTER — Ambulatory Visit (INDEPENDENT_AMBULATORY_CARE_PROVIDER_SITE_OTHER): Payer: Commercial Managed Care - PPO

## 2018-05-23 ENCOUNTER — Other Ambulatory Visit: Payer: Self-pay

## 2018-05-23 DIAGNOSIS — J4550 Severe persistent asthma, uncomplicated: Secondary | ICD-10-CM

## 2018-05-23 MED ORDER — MEPOLIZUMAB 100 MG ~~LOC~~ SOLR
100.0000 mg | Freq: Once | SUBCUTANEOUS | Status: AC
  Administered 2018-05-23: 100 mg via SUBCUTANEOUS

## 2018-05-23 NOTE — Progress Notes (Signed)
Have you been hospitalized within the last 10 days?  No Do you have a fever?  No Do you have a cough?  Yes dry cough Do you have a headache or sore throat? No

## 2018-06-05 ENCOUNTER — Telehealth: Payer: Self-pay

## 2018-06-06 NOTE — Telephone Encounter (Signed)
Kelly skyped me and then transferred the call to me. Pt wants to start getting her injections at home.  Pt is going to [place] to stay with her daughter.  Pt is coming in tomorrow at 2:30 to be trained.

## 2018-06-07 NOTE — Telephone Encounter (Signed)
Pt came in around 2:30, I demonstrated how to use Nucala autoinjector. Then I handed the device to her and asked her what do you do? She said clean the area, take the cap off of the autoinjector, pinch up some muscle from your thigh or abdomin  push on device when you hear it click hold it (medicine is going in) and  wait for a second click. (Once you hear it click again take the device away from your leg or stomach.) Pt understood. Pt recorded me demonstrating the injector as well. Will forward this to DS, Erasmo Downer and Lesleigh Noe to make them aware. We gave pt her first 5 injections with no reactions or sx.

## 2018-06-08 NOTE — Telephone Encounter (Signed)
Noted  

## 2018-06-12 ENCOUNTER — Telehealth: Payer: Self-pay | Admitting: Pulmonary Disease

## 2018-06-12 NOTE — Telephone Encounter (Signed)
Called [facility]. Spoke with Wells Guiles. Was advised that this medication was shipped to the pt's home on 06/10/2018. Pt should receive this medication on 06/12/2018. Pt will bring medication with her to her appointment on 06/18/2018.

## 2018-06-20 ENCOUNTER — Telehealth: Payer: Self-pay

## 2018-06-20 ENCOUNTER — Ambulatory Visit: Payer: Commercial Managed Care - PPO

## 2018-06-20 NOTE — Telephone Encounter (Signed)
Routing to Tammy Scott. 

## 2018-06-22 NOTE — Telephone Encounter (Signed)
Will await response from [facility].

## 2018-06-25 NOTE — Telephone Encounter (Signed)
Spoke to pt, she or her daughter gave shot 06/22/2018. ( Pt is in [place] with her daughter.)  That night she started having back pain on one side. Then Sat the back pain had spread to the other side and terrible headache and nausaeu. Pt went the ER this morning. She said they were running every test on her you could imagine. (ER is definetly going to do the CODVID test, if they haven't already.) Back pain and headache are still terrible. She woke up this morning her nauaseu was worse.  Pt thinks she is dehydrated. Her words, "I'm walking around here like a 63 year old lady." I'll route this to DS and Erasmo Downer as a FYI.

## 2018-06-25 NOTE — Telephone Encounter (Signed)
Please call pt on her cell and ask for further info.. She'll be able to give you the name & # of the hospital for results and updates.

## 2018-06-25 NOTE — Telephone Encounter (Signed)
Called pt back a couple of days ago. Lm with VM to call back.  I called pt today, to make sure she didn't have any questions about administering her Nucala Auto injector. Please refer to phone note 06/05/2018 for a more detailed explianation. I'll leave in encounter open so if pt calls back.

## 2018-06-27 NOTE — Telephone Encounter (Signed)
Noted. Please have her arrange follow up with me after she returns to this area  Thanks  Waunita Schooner

## 2018-06-27 NOTE — Telephone Encounter (Signed)
I called and spoke to patient and check on her. She stated that all her symptoms turned out to be a very bad UTI. She is better with antibiotics, though she is having some nausea with them.  Patient also stated that she has noticed that she is feeling more controlled with her Asthma and the Nucala. She will follow-up when she returns to this area. Patient was very appreciative of [place] calling to check on her.   I spoke to Dr. Alva Garnet and relayed all information. He is aware.

## 2018-07-08 ENCOUNTER — Other Ambulatory Visit: Payer: Self-pay | Admitting: Pulmonary Disease

## 2018-07-10 ENCOUNTER — Ambulatory Visit: Payer: Commercial Managed Care - PPO

## 2018-07-18 ENCOUNTER — Other Ambulatory Visit: Payer: Self-pay

## 2018-07-18 DIAGNOSIS — C649 Malignant neoplasm of unspecified kidney, except renal pelvis: Secondary | ICD-10-CM

## 2018-07-19 ENCOUNTER — Ambulatory Visit: Admission: RE | Admit: 2018-07-19 | Payer: Commercial Managed Care - PPO | Source: Ambulatory Visit

## 2018-07-19 ENCOUNTER — Other Ambulatory Visit: Payer: Commercial Managed Care - PPO

## 2018-07-19 ENCOUNTER — Ambulatory Visit: Payer: Commercial Managed Care - PPO | Admitting: Oncology

## 2018-07-20 ENCOUNTER — Inpatient Hospital Stay: Payer: Commercial Managed Care - PPO | Attending: Oncology

## 2018-07-20 ENCOUNTER — Inpatient Hospital Stay: Payer: Commercial Managed Care - PPO

## 2018-07-20 ENCOUNTER — Other Ambulatory Visit: Payer: Self-pay

## 2018-07-20 ENCOUNTER — Inpatient Hospital Stay: Payer: Commercial Managed Care - PPO | Admitting: Oncology

## 2018-07-20 DIAGNOSIS — Z85528 Personal history of other malignant neoplasm of kidney: Secondary | ICD-10-CM | POA: Diagnosis present

## 2018-07-20 DIAGNOSIS — C649 Malignant neoplasm of unspecified kidney, except renal pelvis: Secondary | ICD-10-CM

## 2018-07-20 DIAGNOSIS — N289 Disorder of kidney and ureter, unspecified: Secondary | ICD-10-CM | POA: Diagnosis not present

## 2018-07-20 DIAGNOSIS — D649 Anemia, unspecified: Secondary | ICD-10-CM | POA: Diagnosis not present

## 2018-07-20 LAB — COMPREHENSIVE METABOLIC PANEL
ALT: 18 U/L (ref 0–44)
AST: 20 U/L (ref 15–41)
Albumin: 4.3 g/dL (ref 3.5–5.0)
Alkaline Phosphatase: 49 U/L (ref 38–126)
Anion gap: 9 (ref 5–15)
BUN: 26 mg/dL — ABNORMAL HIGH (ref 8–23)
CO2: 25 mmol/L (ref 22–32)
Calcium: 9.2 mg/dL (ref 8.9–10.3)
Chloride: 102 mmol/L (ref 98–111)
Creatinine, Ser: 1.36 mg/dL — ABNORMAL HIGH (ref 0.44–1.00)
GFR calc Af Amer: 48 mL/min — ABNORMAL LOW (ref >60)
GFR calc non Af Amer: 41 mL/min — ABNORMAL LOW (ref >60)
Glucose, Bld: 90 mg/dL (ref 70–99)
Potassium: 3.6 mmol/L (ref 3.5–5.1)
Sodium: 136 mmol/L (ref 135–145)
Total Bilirubin: 0.5 mg/dL (ref 0.3–1.2)
Total Protein: 7.3 g/dL (ref 6.5–8.1)

## 2018-07-20 LAB — CBC WITH DIFFERENTIAL/PLATELET
Abs Immature Granulocytes: 0.02 10*3/uL (ref 0.00–0.07)
Basophils Absolute: 0 10*3/uL (ref 0.0–0.1)
Basophils Relative: 1 %
Eosinophils Absolute: 0.1 10*3/uL (ref 0.0–0.5)
Eosinophils Relative: 1 %
HCT: 34 % — ABNORMAL LOW (ref 36.0–46.0)
Hemoglobin: 11.1 g/dL — ABNORMAL LOW (ref 12.0–15.0)
Immature Granulocytes: 0 %
Lymphocytes Relative: 33 %
Lymphs Abs: 2.2 10*3/uL (ref 0.7–4.0)
MCH: 28.2 pg (ref 26.0–34.0)
MCHC: 32.6 g/dL (ref 30.0–36.0)
MCV: 86.5 fL (ref 80.0–100.0)
Monocytes Absolute: 0.4 10*3/uL (ref 0.1–1.0)
Monocytes Relative: 6 %
Neutro Abs: 3.8 10*3/uL (ref 1.7–7.7)
Neutrophils Relative %: 59 %
Platelets: 244 10*3/uL (ref 150–400)
RBC: 3.93 MIL/uL (ref 3.87–5.11)
RDW: 12.6 % (ref 11.5–15.5)
WBC: 6.6 10*3/uL (ref 4.0–10.5)
nRBC: 0 % (ref 0.0–0.2)

## 2018-07-20 LAB — PHOSPHORUS: Phosphorus: 3.7 mg/dL (ref 2.5–4.6)

## 2018-07-20 LAB — MAGNESIUM: Magnesium: 2 mg/dL (ref 1.7–2.4)

## 2018-07-21 LAB — THYROID PANEL WITH TSH
Free Thyroxine Index: 2.1 (ref 1.2–4.9)
T3 Uptake Ratio: 26 % (ref 24–39)
T4, Total: 7.9 ug/dL (ref 4.5–12.0)
TSH: 2.42 u[IU]/mL (ref 0.450–4.500)

## 2018-07-22 NOTE — Progress Notes (Signed)
Heidi Torres  [phone] [phone]) [phone]  ID: Heidi Torres OB: 1955-06-30  MR#: [id number]  NAT#:[id number]  Patient Care Team: [name], MD as PCP - General (Family Medicine) Lloyd Huger, MD as Medical Oncologist (Oncology)  CHIEF COMPLAINT: Stage III clear cell renal cell carcinoma of the left kidney  INTERVAL HISTORY: Patient returns to clinic today for repeat laboratory work and routine 37-month evaluation.  She continues to be highly anxious, but otherwise feels well.  She had a recent UTI and was on extended antibiotics.  Her symptoms have now resolved.  She has no neurologic complaints.  She has a fair appetite, but denies weight loss.  She denies any chest pain, shortness of breath, cough, or hemoptysis.  She has no nausea, vomiting, constipation, or diarrhea.  She has no urinary complaints.  Patient offers no further specific complaints today.  REVIEW OF SYSTEMS:   Review of Systems  Constitutional: Negative.  Negative for fever, malaise/fatigue and weight loss.  Respiratory: Negative.  Negative for cough and shortness of breath.   Cardiovascular: Negative.  Negative for chest pain and leg swelling.  Gastrointestinal: Negative.  Negative for abdominal pain, blood in stool, diarrhea and melena.  Genitourinary: Negative.  Negative for dysuria, flank pain and hematuria.  Musculoskeletal: Negative.  Negative for back pain.  Skin: Negative.  Negative for rash.  Neurological: Negative.  Negative for sensory change, focal weakness, weakness and headaches.  Psychiatric/Behavioral: The patient is nervous/anxious.     As per HPI. Otherwise, a complete review of systems is negative.  PAST MEDICAL HISTORY: Past Medical History:  Diagnosis Date  . Asthma   . Complication of anesthesia   . GERD (gastroesophageal reflux disease)   . Headache    occular HA  . Heart murmur   . History of hiatal hernia   . Hx of dysplastic nevus    multiple sites  . Hx of squamous cell carcinoma of skin 06/22/2015   R infrascapular, SCC in situ  . Hypertension   . Irregular heart beat    pac's and pvc's  . Pneumonia    11-2016  . PONV (postoperative nausea and vomiting)   . Renal cell cancer, left ([place])     PAST SURGICAL HISTORY: Past Surgical History:  Procedure Laterality Date  . ABDOMINAL HYSTERECTOMY    . ABLATION     prior to hysterectomy  . CORONARY ANGIOPLASTY    . CYSTOSCOPY/RETROGRADE/URETEROSCOPY     01-20-17 Dr. Junious Silk  . CYSTOSCOPY/RETROGRADE/URETEROSCOPY Left 01/20/2017   Procedure: CYSTOSCOPY/RETROGRADE/URETEROSCOPY/ BIOPSY,BLADDER BIOPSY PLACEMENT STENT LEFT URETER;  Surgeon: Festus Aloe, MD;  Location: [facility];  Service: Urology;  Laterality: Left;  . DILATION AND CURETTAGE OF UTERUS    . NASAL ENDOSCOPY WITH EPISTAXIS CONTROL    . ROBOTIC ASSITED PARTIAL NEPHRECTOMY Left 02/24/2017   Procedure: XI ROBOTIC ASSITED LEFT  RADICAL NEPHRECTOMY;  Surgeon: Alexis Frock, MD;  Location: [facility];  Service: Urology;  Laterality: Left;  . TUBAL LIGATION      FAMILY HISTORY: Family History  Problem Relation Age of Onset  . Hypertension Brother     ADVANCED DIRECTIVES (Y/N):  N  HEALTH MAINTENANCE: Social History   Tobacco Use  . Smoking status: Never Smoker  . Smokeless tobacco: Never Used  Substance Use Topics  . Alcohol use: No  . Drug use: No     Colonoscopy:  PAP:  Bone density:  Lipid panel:  Allergies  Allergen Reactions  . Ciprofloxacin Other (See Comments)  Arms were numb  . Steri-Strip Compound Benzoin [Benzoin Compound] Other (See Comments)    Blisters    Current Outpatient Medications  Medication Sig Dispense Refill  . ADVAIR DISKUS 500-50 MCG/DOSE AEPB TAKE 1 PUFF BY MOUTH TWICE A DAY 60 each 0  . ALPRAZolam (XANAX) 0.25 MG tablet Take 0.25 mg by mouth at bedtime as needed for anxiety. Anxiety.  5  . aspirin EC 81 MG tablet Take 81 mg by mouth daily.    . Benralizumab  (FASENRA) 30 MG/ML SOSY Inject 30 mg into the skin every 30 (thirty) days. 3 mL 0  . calcitRIOL (ROCALTROL) 0.25 MCG capsule     . Cholecalciferol (VITAMIN D3) 1000 units CAPS Take 1 capsule by mouth daily.    . ciclopirox (PENLAC) 8 % solution     . estradiol (ESTRACE) 2 MG tablet Take 2 mg by mouth daily.  6  . levalbuterol (XOPENEX HFA) 45 MCG/ACT inhaler     . levalbuterol (XOPENEX) 0.63 MG/3ML nebulizer solution Take 3 mLs (0.63 mg total) by nebulization every 4 (four) hours as needed for wheezing or shortness of breath. Use as needed if Xopenex inhaler provides insufficient relief of shortness of breath, wheezing, chest tightness 30 mL 5  . montelukast (SINGULAIR) 10 MG tablet Take 10 mg by mouth daily.    Marland Kitchen olmesartan (BENICAR) 20 MG tablet Take 20 mg by mouth daily.    . pantoprazole (PROTONIX) 40 MG tablet Take 40 mg by mouth 2 (two) times daily.    . potassium chloride (MICRO-K) 10 MEQ CR capsule Take 20 mEq by mouth daily.  11  . ranitidine (ZANTAC) 75 MG tablet Take 75 mg by mouth 2 (two) times daily.    . sucralfate (CARAFATE) 1 G tablet Take 1 g by mouth 2 (two) times daily.     Marland Kitchen triamterene-hydrochlorothiazide (DYAZIDE) 37.5-25 MG per capsule Take 1 capsule by mouth daily.     No current facility-administered medications for this visit.     OBJECTIVE: Vitals:   07/27/18 1514  BP: (!) 147/76  Pulse: 67  Temp: 98.6 F (37 C)     Body mass index is 26.15 kg/m.    ECOG FS:0 - Asymptomatic  General: Well-developed, well-nourished, no acute distress. Eyes: Pink conjunctiva, anicteric sclera. HEENT: Normocephalic, moist mucous membranes. Lungs: Clear to auscultation bilaterally. Heart: Regular rate and rhythm. No rubs, murmurs, or gallops. Abdomen: Soft, nontender, nondistended. No organomegaly noted, normoactive bowel sounds. Musculoskeletal: No edema, cyanosis, or clubbing. Neuro: Alert, answering all questions appropriately. Cranial nerves grossly intact. Skin: No  rashes or petechiae noted. Psych: Normal affect.  LAB RESULTS:  Lab Results  Component Value Date   NA 136 07/20/2018   K 3.6 07/20/2018   CL 102 07/20/2018   CO2 25 07/20/2018   GLUCOSE 90 07/20/2018   BUN 26 (H) 07/20/2018   CREATININE 1.36 (H) 07/20/2018   CALCIUM 9.2 07/20/2018   PROT 7.3 07/20/2018   ALBUMIN 4.3 07/20/2018   AST 20 07/20/2018   ALT 18 07/20/2018   ALKPHOS 49 07/20/2018   BILITOT 0.5 07/20/2018   GFRNONAA 41 (L) 07/20/2018   GFRAA 48 (L) 07/20/2018    Lab Results  Component Value Date   WBC 6.6 07/20/2018   NEUTROABS 3.8 07/20/2018   HGB 11.1 (L) 07/20/2018   HCT 34.0 (L) 07/20/2018   MCV 86.5 07/20/2018   PLT 244 07/20/2018     STUDIES: Ct Abdomen Pelvis Wo Contrast  Result Date: 07/26/2018 CLINICAL DATA:  Stage  III clear cell renal cell carcinoma on the left post nephrectomy 02/24/2017. Patient did not tolerate subsequent Sorafenib therapy. EXAM: CT ABDOMEN AND PELVIS WITHOUT CONTRAST TECHNIQUE: Multidetector CT imaging of the abdomen and pelvis was performed following the standard protocol without IV contrast. COMPARISON:  Abdominopelvic CT 12/22/2017 FINDINGS: Lower chest: Stable mild linear scarring at both lung bases and small pericardial effusion. No significant pleural effusion. Hepatobiliary: The liver appears unremarkable as imaged in the noncontrast state. No evidence of gallstones, gallbladder wall thickening or biliary dilatation. Pancreas: Unremarkable. No pancreatic ductal dilatation or surrounding inflammatory changes. Spleen: Normal in size without focal abnormality. Adrenals/Urinary Tract: The right adrenal gland and right kidney remain unremarkable in appearance. There is no mass within the left nephrectomy bed. Again, the left adrenal gland is not clearly visualized. The bladder appears unremarkable. Stomach/Bowel: No evidence of bowel wall thickening, distention or surrounding inflammatory change. The appendix appears normal.  Vascular/Lymphatic: There are no enlarged abdominal or pelvic lymph nodes. Stable mild aortic and branch vessel atherosclerosis. Reproductive: Hysterectomy. Both ovaries appear unchanged. No adnexal mass. Other: Stable postsurgical changes in the anterior abdominal wall. No ascites or peritoneal nodularity. Musculoskeletal: No acute or significant osseous findings. Lower lumbar spondylosis again noted. IMPRESSION: 1. Stable abdominopelvic CT status post left nephrectomy and presumed adrenalectomy. No evidence of metastatic disease or local recurrence. 2. The right kidney appears unremarkable as imaged in the noncontrast state. 3. Mild Aortic Atherosclerosis (ICD10-I70.0). Electronically Signed   By: Richardean Sale M.D.   On: 07/26/2018 15:05    ASSESSMENT: Stage III clear cell renal cell carcinoma of the left kidney  PLAN:    1. Stage III clear cell renal cell carcinoma of the left kidney: Patient's nephrectomy was on February 24, 2017.  Patient could not tolerate 50 mg or a dose reduction of 37.5 mg of sorafenib secondary to worsening diarrhea, vomiting, and abnormal thyroid panel.  Because of this, treatment was discontinued altogether.  CT scan results from July 26, 2018 reviewed independently and reported as above with no obvious evidence of recurrent or progressive disease.  No intervention is needed at this time.  Return to clinic in 6 months with repeat imaging and further evaluation. 2.  Renal insufficiency: Patient's creatinine has trended up slightly and is now 1.36.  Monitor. 3.  Anemia: Mild, monitor.    Patient expressed understanding and was in agreement with this plan. She also understands that She can call clinic at any time with any questions, concerns, or complaints.   Cancer Staging Cancer of left kidney [facility]) Staging form: Kidney, AJCC 8th Edition - Clinical stage from 03/12/2017: Stage III (cT3a, cN0, cM0) - Signed by Lloyd Huger, MD on 03/12/2017   Lloyd Huger,  MD   07/29/2018 9:23 AM

## 2018-07-26 ENCOUNTER — Other Ambulatory Visit: Payer: Self-pay | Admitting: Pulmonary Disease

## 2018-07-26 ENCOUNTER — Ambulatory Visit
Admission: RE | Admit: 2018-07-26 | Discharge: 2018-07-26 | Disposition: A | Discharge status: Home / Self Care | Payer: Commercial Managed Care - PPO | Source: Ambulatory Visit | Attending: Oncology | Admitting: Oncology

## 2018-07-26 ENCOUNTER — Other Ambulatory Visit: Payer: Self-pay

## 2018-07-26 DIAGNOSIS — C642 Malignant neoplasm of left kidney, except renal pelvis: Secondary | ICD-10-CM | POA: Insufficient documentation

## 2018-07-26 NOTE — Telephone Encounter (Signed)
Will route to [place] to assist with PA.

## 2018-07-27 ENCOUNTER — Other Ambulatory Visit: Payer: Commercial Managed Care - PPO

## 2018-07-27 ENCOUNTER — Encounter: Payer: Self-pay | Admitting: Oncology

## 2018-07-27 ENCOUNTER — Inpatient Hospital Stay (HOSPITAL_BASED_OUTPATIENT_CLINIC_OR_DEPARTMENT_OTHER): Payer: Commercial Managed Care - PPO | Admitting: Oncology

## 2018-07-27 ENCOUNTER — Other Ambulatory Visit: Payer: Self-pay

## 2018-07-27 VITALS — BP 147/76 | HR 67 | Temp 98.6°F | Wt 162.0 lb

## 2018-07-27 DIAGNOSIS — C642 Malignant neoplasm of left kidney, except renal pelvis: Secondary | ICD-10-CM

## 2018-07-27 DIAGNOSIS — N289 Disorder of kidney and ureter, unspecified: Secondary | ICD-10-CM | POA: Diagnosis not present

## 2018-07-27 DIAGNOSIS — Z85528 Personal history of other malignant neoplasm of kidney: Secondary | ICD-10-CM | POA: Diagnosis not present

## 2018-07-27 DIAGNOSIS — I10 Essential (primary) hypertension: Secondary | ICD-10-CM | POA: Insufficient documentation

## 2018-07-27 DIAGNOSIS — E7800 Pure hypercholesterolemia, unspecified: Secondary | ICD-10-CM | POA: Insufficient documentation

## 2018-07-27 DIAGNOSIS — J3089 Other allergic rhinitis: Secondary | ICD-10-CM | POA: Insufficient documentation

## 2018-07-27 DIAGNOSIS — J302 Other seasonal allergic rhinitis: Secondary | ICD-10-CM | POA: Insufficient documentation

## 2018-07-27 DIAGNOSIS — J45909 Unspecified asthma, uncomplicated: Secondary | ICD-10-CM | POA: Insufficient documentation

## 2018-07-27 DIAGNOSIS — D649 Anemia, unspecified: Secondary | ICD-10-CM | POA: Diagnosis not present

## 2018-07-31 NOTE — Telephone Encounter (Signed)
I faxed a form to our [place] office on 07/20/2018 to start the PA for the pt's medication. This form was not faxed back to our [place] office until 07/27/2018, I was not in the office on this day. The form that was faxed back is not legible and can't be faxed to the pt's insurance company. PA will have to be done over the phone. Will update chart once PA is started over the phone.

## 2018-08-01 ENCOUNTER — Telehealth: Payer: Self-pay | Admitting: Pulmonary Disease

## 2018-08-01 NOTE — Telephone Encounter (Signed)
Checked Cover My Meds. PA has been approved.

## 2018-08-01 NOTE — Telephone Encounter (Signed)
PA was completed over Cover My Meds. PA has been approved.

## 2018-08-01 NOTE — Telephone Encounter (Signed)
Received a fax from [place]. Pt's current PA for Nucala has expired. PA renewal has been started on Cover My Meds. Key: [id number] Will follow up on PA determination.

## 2018-08-03 ENCOUNTER — Telehealth: Payer: Self-pay | Admitting: Pulmonary Disease

## 2018-08-03 NOTE — Telephone Encounter (Signed)
Called and spoke to pt, who is questioning if Nulcala can increased BUN and Creatinine and decreased GFR? DS please advise.   Also pt is is questioning status for PA. Ria Comment, can you help with this. Thanks

## 2018-08-06 ENCOUNTER — Ambulatory Visit: Payer: Commercial Managed Care - PPO

## 2018-08-06 NOTE — Telephone Encounter (Signed)
Called and spoke to pt, and made her aware of approval.  Will leave encounter open, until we receive response from Dr. Alva Garnet.

## 2018-08-06 NOTE — Telephone Encounter (Signed)
The PA for Nucala has been approved per my documentation in telephone encountered dated 08/01/2018.

## 2018-08-06 NOTE — Telephone Encounter (Signed)
Nucala has not been reported to cause kidney injury so I don't think it is related to abnormalities in her kidney function tests  Thanks  Waunita Schooner

## 2018-08-06 NOTE — Telephone Encounter (Signed)
Pt is aware of below message and voiced her understanding. Nothing further is needed. 

## 2018-08-10 ENCOUNTER — Other Ambulatory Visit: Payer: Commercial Managed Care - PPO

## 2018-08-10 ENCOUNTER — Ambulatory Visit: Payer: Commercial Managed Care - PPO | Admitting: Oncology

## 2018-08-15 ENCOUNTER — Telehealth: Payer: Self-pay | Admitting: Pulmonary Disease

## 2018-08-15 ENCOUNTER — Telehealth: Payer: Self-pay | Admitting: Obstetrics & Gynecology

## 2018-08-15 NOTE — Telephone Encounter (Signed)
Called and spoke to pt.  Pt is requesting lab to check Eosinophils levels to determine if Nucala is effective.  Based off of these labs, pt may trail off of Nucala due to her GFR being low.    Dr. Alva Garnet please advise. Thanks

## 2018-08-15 NOTE — Telephone Encounter (Signed)
Patient called to say she was seen at an ER in [place], [place] (she thinks part of [facility]) last night, and they questioned her about a mastectomy w/ Dr. Laurey Morale in 2017, and said she has never had a mastectomy, that Dr. Laurey Morale has retired, and she sees Dr. Kenton Kingfisher. Patient wanted to see about having this corrected. Patient is aware I could not find any information regarding a mastectomy, only breast imaging for a mass in 2017 at [facility]. Patient said this was correct, and will f/u with the health system where she was seen last night. Patient said she saw Dr. Kenton Kingfisher this year, and will contact [facility] to schedule her screening mammogram. I offered to have that scheduled for her, but she said she will call when she is ready.

## 2018-08-20 NOTE — Telephone Encounter (Signed)
Pt is aware of below recommendations and voiced her understanding.  Pt has been scheduled for OV on 09/05/2018. Nothing further is needed.

## 2018-08-20 NOTE — Telephone Encounter (Signed)
The most important measure of effectiveness is whether her breathing is better. We can order a CBC with diff when she follows up in the office. She needs to make a follow up appt. She was supposed to have been seen in Mar or Apr  Dave

## 2018-08-29 ENCOUNTER — Ambulatory Visit: Payer: Self-pay | Admitting: Urology

## 2018-09-05 ENCOUNTER — Other Ambulatory Visit
Admission: RE | Admit: 2018-09-05 | Discharge: 2018-09-05 | Disposition: A | Discharge status: Home / Self Care | Payer: Commercial Managed Care - PPO | Attending: Pulmonary Disease | Admitting: Pulmonary Disease

## 2018-09-05 ENCOUNTER — Encounter: Payer: Self-pay | Admitting: Pulmonary Disease

## 2018-09-05 ENCOUNTER — Other Ambulatory Visit: Payer: Self-pay

## 2018-09-05 ENCOUNTER — Ambulatory Visit (INDEPENDENT_AMBULATORY_CARE_PROVIDER_SITE_OTHER): Payer: Commercial Managed Care - PPO | Admitting: Pulmonary Disease

## 2018-09-05 VITALS — BP 132/78 | HR 65 | Temp 97.7°F | Ht 66.0 in | Wt 161.8 lb

## 2018-09-05 DIAGNOSIS — J4540 Moderate persistent asthma, uncomplicated: Secondary | ICD-10-CM

## 2018-09-05 DIAGNOSIS — D721 Eosinophilia, unspecified: Secondary | ICD-10-CM

## 2018-09-05 DIAGNOSIS — N189 Chronic kidney disease, unspecified: Secondary | ICD-10-CM | POA: Diagnosis not present

## 2018-09-05 LAB — CBC WITH DIFFERENTIAL/PLATELET
Abs Immature Granulocytes: 0.01 10*3/uL (ref 0.00–0.07)
Basophils Absolute: 0 10*3/uL (ref 0.0–0.1)
Basophils Relative: 0 %
Eosinophils Absolute: 0.1 10*3/uL (ref 0.0–0.5)
Eosinophils Relative: 1 %
HCT: 36.1 % (ref 36.0–46.0)
Hemoglobin: 11.6 g/dL — ABNORMAL LOW (ref 12.0–15.0)
Immature Granulocytes: 0 %
Lymphocytes Relative: 27 %
Lymphs Abs: 2.3 10*3/uL (ref 0.7–4.0)
MCH: 28.1 pg (ref 26.0–34.0)
MCHC: 32.1 g/dL (ref 30.0–36.0)
MCV: 87.4 fL (ref 80.0–100.0)
Monocytes Absolute: 0.4 10*3/uL (ref 0.1–1.0)
Monocytes Relative: 5 %
Neutro Abs: 5.7 10*3/uL (ref 1.7–7.7)
Neutrophils Relative %: 67 %
Platelets: 279 10*3/uL (ref 150–400)
RBC: 4.13 MIL/uL (ref 3.87–5.11)
RDW: 13.3 % (ref 11.5–15.5)
WBC: 8.5 10*3/uL (ref 4.0–10.5)
nRBC: 0 % (ref 0.0–0.2)

## 2018-09-05 NOTE — Patient Instructions (Signed)
Continue Advair Diskus 500/50, 1 inhalation twice a day.  Rinse mouth after use Continue montelukast (Singulair) 10 mg daily Continue Xopenex inhaler as needed for increased shortness of breath, wheezing, chest tightness, cough  It is okay to undergo a 36-month trial off of Nucala to see if this has impact on kidney function  Blood tests today: BMET (to monitor kidney function), CBC with differential (to look at eosinophil count)  Follow-up in 3-4 months with Dr. Patsey Berthold.  Sometime in the next 3 to 6 months, PFTs should be repeated

## 2018-09-05 NOTE — Progress Notes (Signed)
PULMONARY OFFICE FOLLOW-UP NOTE  Requesting MD/Service: Self Date of initial consultation: 09/18/17 Reason for consultation: Moderate persistent asthma  PT PROFILE: 63 y.o. female never smoker with history of asthma since childhood self-referred for persistent dyspnea and chest tightness.   DATA: 12/22/16 PFTs [name]): FVC: 2.87 L (93 %pred), FEV1: 2.11 L (85 %pred), FEV1/FVC: 73%, TLC: 98 %pred, DLCO 97 %pred  11/29/17 CT chest: Central tree-in-bud opacity with scattered nodularity and patchy ground-glass attenuation identified in the right upper, middle, and lower lobes 06/23/17 CT chest: No significant findings.  Previously seen inflammatory changes resolved 11/07/17 PFTs: FVC: 2.29 > 2.82 L (68 > 84 %pred), FEV1: 1.62 > 2.12 L (62 > 81 %pred), FEV1/FVC: 71%, TLC: 6.06 L (113 %pred), DLCO 83 %pred    Abs eos count: 06/01/17: 1600 06/19/17: 1200 06/27/17:   900 08/07/17: 1200 10/03/17:   900  INTERVAL: Last visit 12/21/17.  Nucala initiated 03/2018.   SUBJ:  This is a scheduled follow-up.  Since initiating Nucala, she believes that her asthma control is much improved.  She has much less exertional dyspnea.  Previously, with certain exposures such as dust and animal dander she would develop wheezing.  This is no longer occurring.  She is using Xopenex approximately 3 times per week.  However, she has had a nephrectomy for renal cell carcinoma and her renal function is followed closely.  Her baseline GFR is in the 45-50 range.  Her last BMET revealed a GFR of 41.  She is concerned that Nucala has caused a worsening of her renal function.  This question was raised previously (via phone encounter) and I have done a literature review.  I have found no reports of worsening renal function attributable to Nucala or other similar medications.  I explained to her that I doubt that this is the cause of apparent decline in GFR.  I note that she is on olmesartan which is a more likely  explanation for a reduced GFR.  Nonetheless, she strongly wishes to try off of the medication for 3 months to see if GFR improves.  Presently, she has no new complaints related to asthma or respiratory system.  Cough and hoarseness have resolved (after discontinuation of ACE inhibitor).  She has minimal exertional dyspnea.  She remains on Advair 500/50 and montelukast as maintenance medications.  She denies CP, fever, purulent sputum, hemoptysis, LE edema and calf tenderness.    Vitals:   09/05/18 1104  BP: 132/78  Pulse: 65  Temp: 97.7 F (36.5 C)  SpO2: 97%  Weight: 161 lb 12.8 oz (73.4 kg)  Height: 5\' 6"  (1.676 m)  RA  EXAM:  Gen: NAD HEENT: NCAT, sclerae white Neck: No JVD Lungs: breath sounds full, no wheezes or other adventitious sounds Cardiovascular: RRR, no murmurs Abdomen: Soft, nontender, normal BS Ext: without clubbing, cyanosis, edema Neuro: grossly intact Skin: Limited exam, no lesions noted    DATA:   BMP Latest Ref Rng & Units 07/20/2018 01/09/2018 10/03/2017  Glucose 70 - 99 mg/dL 90 98 89  BUN 8 - 23 mg/dL 26(H) 28(H) 31(H)  Creatinine 0.44 - 1.00 mg/dL 1.36(H) 1.19(H) 1.28(H)  Sodium 135 - 145 mmol/L 136 139 138  Potassium 3.5 - 5.1 mmol/L 3.6 3.9 3.7  Chloride 98 - 111 mmol/L 102 104 100  CO2 22 - 32 mmol/L 25 28 26   Calcium 8.9 - 10.3 mg/dL 9.2 9.6 10.0    CBC Latest Ref Rng & Units 07/20/2018 01/09/2018 10/03/2017  WBC 4.0 - 10.5  K/uL 6.6 6.4 6.7  Hemoglobin 12.0 - 15.0 g/dL 11.1(L) 11.8(L) 12.6  Hematocrit 36.0 - 46.0 % 34.0(L) 37.1 37.2  Platelets 150 - 400 K/uL 244 263 283     CXR: No new film  I have personally reviewed all chest radiographs reported above including CXRs and CT chest unless otherwise indicated  IMPRESSION:     ICD-10-CM   1. Moderate persistent asthma without complication  123456   2. Eosinophilia  D72.1 CBC with Differential/Platelet  3. Chronic kidney disease, unspecified CKD stage  [id number] Basic metabolic panel    Presently asthma is well controlled.  It seems that the addition of Nucala has made a significant favorable impact.  Previous dysphonia and cough are resolved and are likely attributable to the ACE inhibitor which has now been discontinued.  We discussed in detail asthma management.  We agree that Geradine Girt has probably had a favorable impact.  Nonetheless, she is insistent on trying off of it to see if kidney function improves.  I have agreed that she may undergo a "N of 1 trial" off of Nucala for 3 months paying close attention to asthma control including symptoms and frequency of rescue inhaler use   PLAN:  Blood tests ordered today: BMET (to monitor kidney function) and CBC with differential (to look at eosinophil count)  Continue Advair discus 500/50, 1 inhalation twice daily.  Rinse mouth after use Continue montelukast 10 mg daily Continue Xopenex inhaler as needed for increased shortness of breath, wheezing, chest tightness, cough  Follow-up in 3-4 months with Dr. Patsey Berthold.  At that time, further decisions regarding Nucala (resumption or permanent discontinuation) will be discussed.  At some point within the next 6 months she should have repeat PFTs performed.   25 mins of this encounter involved counseling and discussion of the above plan  Merton Border, MD PCCM service Mobile [phone] Pager [phone] 09/05/2018 12:02 PM

## 2018-09-05 NOTE — Addendum Note (Signed)
Addended by: Santiago Bur on: 09/05/2018 12:28 PM   Modules accepted: Orders

## 2018-09-11 ENCOUNTER — Ambulatory Visit (INDEPENDENT_AMBULATORY_CARE_PROVIDER_SITE_OTHER): Payer: Commercial Managed Care - PPO | Admitting: Urology

## 2018-09-11 ENCOUNTER — Encounter: Payer: Self-pay | Admitting: Urology

## 2018-09-11 ENCOUNTER — Other Ambulatory Visit: Payer: Self-pay

## 2018-09-11 ENCOUNTER — Telehealth: Payer: Self-pay

## 2018-09-11 VITALS — BP 113/62 | HR 62 | Ht 66.0 in | Wt 158.0 lb

## 2018-09-11 DIAGNOSIS — N393 Stress incontinence (female) (male): Secondary | ICD-10-CM

## 2018-09-11 DIAGNOSIS — J4540 Moderate persistent asthma, uncomplicated: Secondary | ICD-10-CM

## 2018-09-11 DIAGNOSIS — Z85528 Personal history of other malignant neoplasm of kidney: Secondary | ICD-10-CM

## 2018-09-11 DIAGNOSIS — N390 Urinary tract infection, site not specified: Secondary | ICD-10-CM

## 2018-09-11 MED ORDER — PREMARIN 0.625 MG/GM VA CREA: 1.0000 | TOPICAL_CREAM | Freq: Every day | VAGINAL | 12 refills | Status: DC

## 2018-09-11 NOTE — Progress Notes (Signed)
09/11/2018 8:51 PM   Heidi Torres 01-Sep-1955 [id number]  Referring provider: [name], MD [facility] [place],  [place] [address]  Chief Complaint  Patient presents with   Recurrent UTI    HPI: 63 year old female referred for further evaluation of recurrent urinary tract infections.  She reports that she was seen and evaluated and admitted in 06/2018 on a business trip in the [place].  Her presentation included high fevers, nausea vomiting, malaise but without any specific urinary symptoms including no dysuria or gross hematuria.  She did have episodes of large-volume incontinence following the admission that lasted for several days but resolved.  She was ultimately diagnosed with ESBL E. coli per her report.  More recently, she was seen and evaluated at [facility] urgent care.  She developed a stabbing urethral pain which felt like a piece of wood jammed up her urethra which lasted proximately 6 to 12 hours.  She took Pyridium for this and her pain resolved.  She was seen and evaluated [facility] urgent care. This was on 08/19/2018.  This time, her UA was completely negative however she had nitrite positive UA.  Urine culture grew mixed flora.  Despite her negative urine, she was treated with Macrobid and told she had a UTI.  She does already have a urologist.  She underwent nephrectomy by Dr. [name] on 02/2017 for left hilar renal mass c/w RCC.  She is now followed by Dr. Grayland Ormond at the cancer center.  She is not able to tolerate adjuvant therapy.     She reports today that she is particular concerned about these urinary tract infections given that she lives 1 kidney.  She does report that she is followed by nephrology and her creatinine is noted to be rising slightly.  She wants to adamantly protect her remaining kidney.   She is status post hysterectomy.  Her ovaries remain in place.  She is never had any menopausal symptoms including hot flashes.  She does  take oral estradiol intermittently.  This was prescribed by her PCP many years ago.  She does not ever remember having conversation about why she is taking this medication nor does she recall that the risk and benefits were discussed.  She does also report today that she has baseline stress incontinence.  This seems to be worsening over the past several years.  She had a bladder tack at the time of hysterectomy by Dr. Kenton Kingfisher.  She reports that after this, she had minimal to no urinary symptoms but over the years, her leakage with laughing coughing sneezing, and physical activity has worsened.  This is now bothersome to her.   No alleviating factors.  She denies any flank pain or kidney stones.   PMH: Past Medical History:  Diagnosis Date   Asthma    Complication of anesthesia    GERD (gastroesophageal reflux disease)    Headache    occular HA   Heart murmur    History of hiatal hernia    Hx of dysplastic nevus    multiple sites   Hx of squamous cell carcinoma of skin 06/22/2015   R infrascapular, SCC in situ   Hypertension    Irregular heart beat    pac's and pvc's   Pneumonia    11-2016   PONV (postoperative nausea and vomiting)    Renal cell cancer, left [facility])     Surgical History: Past Surgical History:  Procedure Laterality Date   ABDOMINAL HYSTERECTOMY  ABLATION     prior to hysterectomy   CORONARY ANGIOPLASTY     CYSTOSCOPY/RETROGRADE/URETEROSCOPY     01-20-17 Dr. Junious Silk   CYSTOSCOPY/RETROGRADE/URETEROSCOPY Left 01/20/2017   Procedure: CYSTOSCOPY/RETROGRADE/URETEROSCOPY/ [id number] BIOPSY PLACEMENT STENT LEFT URETER;  Surgeon: Festus Aloe, MD;  Location: [facility] ORS;  Service: Urology;  Laterality: Left;   DILATION AND CURETTAGE OF UTERUS     NASAL ENDOSCOPY WITH EPISTAXIS CONTROL     ROBOTIC ASSITED PARTIAL NEPHRECTOMY Left 02/24/2017   Procedure: XI ROBOTIC ASSITED LEFT  RADICAL NEPHRECTOMY;  Surgeon: Alexis Frock, MD;  Location: [facility]  ORS;  Service: Urology;  Laterality: Left;   TUBAL LIGATION      Home Medications:  Allergies as of 09/11/2018      Reactions   Ciprofloxacin Other (See Comments)   Arms were numb   Steri-strip Compound Benzoin [benzoin Compound] Other (See Comments)   Blisters      Medication List       Accurate as of September 11, 2018  8:51 PM. If you have any questions, ask your nurse or doctor.        Advair Diskus 500-50 MCG/DOSE Aepb Generic drug: Fluticasone-Salmeterol TAKE 1 PUFF BY MOUTH TWICE A DAY   ALPRAZolam 0.25 MG tablet Commonly known as: XANAX Take 0.25 mg by mouth at bedtime as needed for anxiety. Anxiety.   aspirin EC 81 MG tablet Take 81 mg by mouth daily.   calcitRIOL 0.25 MCG capsule Commonly known as: ROCALTROL   ciclopirox 8 % solution Commonly known as: PENLAC   estradiol 2 MG tablet Commonly known as: ESTRACE Take 2 mg by mouth daily.   levalbuterol 0.63 MG/3ML nebulizer solution Commonly known as: XOPENEX Take 3 mLs (0.63 mg total) by nebulization every 4 (four) hours as needed for wheezing or shortness of breath. Use as needed if Xopenex inhaler provides insufficient relief of shortness of breath, wheezing, chest tightness   levalbuterol 45 MCG/ACT inhaler Commonly known as: XOPENEX HFA   montelukast 10 MG tablet Commonly known as: SINGULAIR Take 10 mg by mouth daily.   olmesartan 20 MG tablet Commonly known as: BENICAR Take 20 mg by mouth daily.   pantoprazole 40 MG tablet Commonly known as: PROTONIX Take 40 mg by mouth 2 (two) times daily.   potassium chloride 10 MEQ CR capsule Commonly known as: MICRO-K Take 20 mEq by mouth daily.   Premarin vaginal cream Generic drug: conjugated estrogens Place 1 Applicatorful vaginally daily. Use pea sized amount M-W-Fr before bedtime Started by: Hollice Espy, MD   ranitidine 75 MG tablet Commonly known as: ZANTAC Take 75 mg by mouth 2 (two) times daily.   sucralfate 1 g tablet Commonly known  as: CARAFATE Take 1 g by mouth 2 (two) times daily.   triamterene-hydrochlorothiazide 37.5-25 MG capsule Commonly known as: DYAZIDE Take 1 capsule by mouth daily.   Vitamin D3 25 MCG (1000 UT) Caps Take 1 capsule by mouth daily.       Allergies:  Allergies  Allergen Reactions   Ciprofloxacin Other (See Comments)    Arms were numb   Steri-Strip Compound Benzoin [Benzoin Compound] Other (See Comments)    Blisters    Family History: Family History  Problem Relation Age of Onset   Hypertension Brother     Social History:  reports that she has never smoked. She has never used smokeless tobacco. She reports that she does not drink alcohol or use drugs.  ROS: UROLOGY Frequent Urination?: No Hard to postpone urination?: No Burning/pain with urination?:  No Get up at night to urinate?: No Leakage of urine?: Yes Urine stream starts and stops?: No Trouble starting stream?: No Do you have to strain to urinate?: No Blood in urine?: No Urinary tract infection?: Yes Sexually transmitted disease?: No Injury to kidneys or bladder?: Yes Painful intercourse?: No Weak stream?: No Currently pregnant?: No Vaginal bleeding?: No Last menstrual period?: n  Gastrointestinal Nausea?: No Vomiting?: No Indigestion/heartburn?: No Diarrhea?: No Constipation?: Yes  Constitutional Fever: No Night sweats?: No Weight loss?: No Fatigue?: No  Skin Skin rash/lesions?: No Itching?: No  Eyes Blurred vision?: No Double vision?: No  Ears/Nose/Throat Sore throat?: No Sinus problems?: No  Hematologic/Lymphatic Swollen glands?: No Easy bruising?: Yes  Cardiovascular Leg swelling?: No Chest pain?: No  Respiratory Cough?: No Shortness of breath?: No  Endocrine Excessive thirst?: No  Musculoskeletal Back pain?: Yes Joint pain?: No  Neurological Headaches?: No Dizziness?: No  Psychologic Depression?: No Anxiety?: No  Physical Exam: BP 113/62    Pulse 62    Ht  5\' 6"  (1.676 m)    Wt 158 lb (71.7 kg)    LMP  (LMP Unknown)    BMI 25.50 kg/m   Constitutional:  Alert and oriented, No acute distress. HEENT: [place] AT, moist mucus membranes.  Trachea midline, no masses. Cardiovascular: No clubbing, cyanosis, or edema. Respiratory: Normal respiratory effort, no increased work of breathing. GI: Abdomen is soft, nontender, nondistended, no abdominal masses Pelvic: Chaperoned by CMA.  Normal external genitalia.  Normal urethral meatus with slight hypermobility with Valsalva.  Reasonable anterior support with stage I cystocele.  No apical descent.  No rectocele appreciated.  Slightly atrophic vaginal mucosa otherwise unremarkable.  No demonstrable bladder tenderness or levator tenderness bilaterally.  No periurethral tenderness.  No discharge with urethral expression. Skin: No rashes, bruises or suspicious lesions. Neurologic: Grossly intact, no focal deficits, moving all 4 extremities. Psychiatric: Normal mood and affect.  Laboratory Data: Lab Results  Component Value Date   WBC 8.5 09/05/2018   HGB 11.6 (L) 09/05/2018   HCT 36.1 09/05/2018   MCV 87.4 09/05/2018   PLT 279 09/05/2018    Lab Results  Component Value Date   CREATININE 1.36 (H) 07/20/2018    Urinalysis Results for orders placed or performed in visit on 09/11/18  Microscopic Examination   URINE  Result Value Ref Range   WBC, UA None seen 0 - 5 /hpf   RBC None seen 0 - 2 /hpf   Epithelial Cells (non renal) 0-10 0 - 10 /hpf   Bacteria, UA None seen None seen/Few  Urinalysis, Complete  Result Value Ref Range   Specific Gravity, UA 1.020 1.005 - 1.030   pH, UA 5.5 5.0 - 7.5   Color, UA Yellow Yellow   Appearance Ur Clear Clear   Leukocytes,UA Negative Negative   Protein,UA Negative Negative/Trace   Glucose, UA Negative Negative   Ketones, UA Negative Negative   RBC, UA Negative Negative   Bilirubin, UA Negative Negative   Urobilinogen, Ur 0.2 0.2 - 1.0 mg/dL   Nitrite, UA Negative  Negative   Microscopic Examination See below:     Pertinent Imaging: n/a  Assessment & Plan:    1. Recurrent UTI Based on her history, she is had one isolated ESBL E. coli episode in 06/2018 requiring admission.  Her subsequent bouts of pain in the level of her urethra were not associated with true infection.  No obvious anatomic findings today on pelvic exam to explain her symptoms.  Her UA  was unremarkable at the time without evidence of infection.  I suspect this may have been related to a bladder spasm or perhaps transient urethritis which resolved.  Symptoms improved with Pyridium only after a few hours.  I recommended treatment as needed with Pyridium and to seek urgent same-day evaluation our office if she has similar symptoms.  May consider cystoscopy or further evaluation of her urethra if she continues have recurrent episodes.  In addition to the above, she may benefit from some topical estrogen cream.  She is taking oral hormone replacement therapy and not sure why.  She like to get off this medication and as such, I would recommend transitioning her over to a topical estrogen cream per urethra 3 times a week.  Risk and benefits of this were discussed in detail along with instructions on how to do so. - Urinalysis, Complete  2. History of renal cell carcinoma Followed by Dr. Grayland Ormond at the cancer center for surveillance  3. Stress incontinence, female Mild but bothersome stress incontinence  We discussed Kegel exercises versus surgical intervention.    She also may benefit from topical estrogen cream.   If she would like to discuss surgical intervention further, I recommended that she follow-up with my partner, Dr. Nicki Reaper MacDairmid who specializes in female incontinence.  Hollice Espy, MD  [facility] [address], [place], [place] [address] [phone]

## 2018-09-11 NOTE — Telephone Encounter (Signed)
Left message for pt

## 2018-09-11 NOTE — Telephone Encounter (Signed)
-----   Message from Wilhelmina Mcardle, MD sent at 09/11/2018  8:48 AM EDT ----- Let her know that eosinophil count is 100. It was previously in the [phone] range on multiple measurements. The BMET has not been resulted - I'm not sure why it hasn't been done. Please look into this...  Thanks  Waunita Schooner

## 2018-09-12 LAB — URINALYSIS, COMPLETE
Bilirubin, UA: NEGATIVE
Glucose, UA: NEGATIVE
Ketones, UA: NEGATIVE
Leukocytes,UA: NEGATIVE
Nitrite, UA: NEGATIVE
Protein,UA: NEGATIVE
RBC, UA: NEGATIVE
Specific Gravity, UA: 1.02 (ref 1.005–1.030)
Urobilinogen, Ur: 0.2 mg/dL (ref 0.2–1.0)
pH, UA: 5.5 (ref 5.0–7.5)

## 2018-09-12 LAB — MICROSCOPIC EXAMINATION
Bacteria, UA: NONE SEEN
RBC, Urine: NONE SEEN /hpf (ref 0–2)
WBC, UA: NONE SEEN /hpf (ref 0–5)

## 2018-09-12 NOTE — Telephone Encounter (Signed)
Pt is aware of results and voiced her understanding.  Called and spoke to [place] with lab, who confirmed that BMET was not drawn. BMET can not be added on, as CBC has already been resulted.   DS please advise if you would like for BMET to be reordered?

## 2018-09-16 NOTE — Telephone Encounter (Signed)
Order still exists, I think. If not, please re-order  Heidi Torres

## 2018-09-17 NOTE — Addendum Note (Signed)
Addended by: Maryanna Shape A on: 09/17/2018 08:04 AM   Modules accepted: Orders

## 2018-09-17 NOTE — Telephone Encounter (Signed)
Bmet has been reordered. Pt is aware to come back by [facility] mall to have labs. Nothing further is needed.

## 2018-09-25 DIAGNOSIS — N183 Chronic kidney disease, stage 3 unspecified: Secondary | ICD-10-CM | POA: Insufficient documentation

## 2018-09-28 ENCOUNTER — Telehealth: Payer: Self-pay | Admitting: Pulmonary Disease

## 2018-09-28 NOTE — Telephone Encounter (Signed)
Called and spoke with Patient. Patient requested regular flu vaccine. Patient scheduled 10/03/18, at 1100. Patient is aware of office location.

## 2018-10-02 ENCOUNTER — Telehealth: Payer: Self-pay | Admitting: Pulmonary Disease

## 2018-10-02 NOTE — Telephone Encounter (Signed)
Called and spoke to pt, who is requesting flu vaccine.  I have made pt aware that we do not offer nurse visit. Advised pt that appt with provider would be needed. Pt stated that she would contact the [facility] office to schedule flu vaccine.  Nothing further is needed at this time.

## 2018-10-03 ENCOUNTER — Ambulatory Visit: Payer: Commercial Managed Care - PPO

## 2018-10-05 ENCOUNTER — Ambulatory Visit (INDEPENDENT_AMBULATORY_CARE_PROVIDER_SITE_OTHER): Payer: Commercial Managed Care - PPO

## 2018-10-05 ENCOUNTER — Other Ambulatory Visit: Payer: Self-pay

## 2018-10-05 DIAGNOSIS — Z23 Encounter for immunization: Secondary | ICD-10-CM

## 2018-10-17 ENCOUNTER — Other Ambulatory Visit: Payer: Self-pay

## 2018-10-17 ENCOUNTER — Other Ambulatory Visit: Payer: Commercial Managed Care - PPO

## 2018-10-17 DIAGNOSIS — R300 Dysuria: Secondary | ICD-10-CM

## 2018-10-18 ENCOUNTER — Telehealth: Payer: Self-pay | Admitting: Physician Assistant

## 2018-10-18 LAB — MICROSCOPIC EXAMINATION
Epithelial Cells (non renal): NONE SEEN /hpf (ref 0–10)
RBC, Urine: NONE SEEN /hpf (ref 0–2)

## 2018-10-18 LAB — URINALYSIS, COMPLETE
Bilirubin, UA: NEGATIVE
Glucose, UA: NEGATIVE
Ketones, UA: NEGATIVE
Leukocytes,UA: NEGATIVE
Nitrite, UA: NEGATIVE
Protein,UA: NEGATIVE
RBC, UA: NEGATIVE
Specific Gravity, UA: 1.015 (ref 1.005–1.030)
Urobilinogen, Ur: 0.2 mg/dL (ref 0.2–1.0)
pH, UA: 6.5 (ref 5.0–7.5)

## 2018-10-18 NOTE — Telephone Encounter (Signed)
Pt called asking for UA.

## 2018-10-19 NOTE — Telephone Encounter (Signed)
Left message for results for her UA , It was negative. Left on her Voices mail

## 2018-10-22 ENCOUNTER — Ambulatory Visit: Payer: Commercial Managed Care - PPO | Admitting: Urology

## 2018-10-29 ENCOUNTER — Ambulatory Visit (INDEPENDENT_AMBULATORY_CARE_PROVIDER_SITE_OTHER): Payer: Commercial Managed Care - PPO | Admitting: Urology

## 2018-10-29 ENCOUNTER — Encounter: Payer: Self-pay | Admitting: Urology

## 2018-10-29 ENCOUNTER — Other Ambulatory Visit: Payer: Self-pay

## 2018-10-29 VITALS — BP 144/73 | HR 68 | Ht 66.0 in | Wt 162.0 lb

## 2018-10-29 DIAGNOSIS — N393 Stress incontinence (female) (male): Secondary | ICD-10-CM

## 2018-10-29 DIAGNOSIS — N390 Urinary tract infection, site not specified: Secondary | ICD-10-CM | POA: Diagnosis not present

## 2018-10-29 MED ORDER — TRIMETHOPRIM 100 MG PO TABS: 100.0000 mg | ORAL_TABLET | Freq: Every day | ORAL | 11 refills | Status: DC

## 2018-10-29 NOTE — Progress Notes (Signed)
10/29/2018 3:28 PM   Heidi Torres [birth year] [id number]  Referring provider: [name], MD [facility] [place],  [place] [address]  Chief Complaint  Patient presents with  . Urinary Incontinence    HPI: Dr [name] Patient had a symptomatic urinary tract infection in June 2020 with a positive culture.  She had an episode of significant pain in the urethra with apparently a negative culture treated with Macrodantin at [facility]  She does already have a urologist.  She underwent nephrectomy by Dr. [name] on 02/2017 for left hilar renal mass c/w RCC.    She reports today that she is particular concerned about these urinary tract infections given that she lives 1 kidney.  She does report that she is followed by nephrology and her creatinine is noted to be rising slightly.  She wants to adamantly protect her remaining kidney.   She is status post hysterectomy.  She had stress incontinence affecting her quality life.  She has had a bladder suspension time of her hysterectomy.  She had a single normal right kidney on CT scan in July 2020 and culture in June within normal limits  Today Patient leaks with coughing sneezing and sometimes bending and lifting with her bladder is full.  She has urge incontinence.  Post significant.  She denies bedwetting.  She wears 2 pads a day and I think they are damp or a little bit more wet  She reports 3 bladder infections being treated in the last year with burning and frequency that responded favorably to antibiotics.  The urethral discomfort has happened approximately 3 times.  She has rarely taken Azo-Standard.  She reduced some bladder irritants in her diet.  Sometimes when she voids she feels almost a burning sensation that radiates upward into her abdomen  No other neurologic issues.  Prone to constipation.  No medical treatment for incontinence  Modifying factors: There are no other modifying factors  Associated  signs and symptoms: There are no other associated signs and symptoms Aggravating and relieving factors: There are no other aggravating or relieving factors Severity: Moderate Duration: Persistent       PMH: Past Medical History:  Diagnosis Date  . Asthma   . Complication of anesthesia   . GERD (gastroesophageal reflux disease)   . Headache    occular HA  . Heart murmur   . History of hiatal hernia   . Hx of dysplastic nevus    multiple sites  . Hx of squamous cell carcinoma of skin 06/22/2015   R infrascapular, SCC in situ  . Hypertension   . Irregular heart beat    pac's and pvc's  . Pneumonia    11-2016  . PONV (postoperative nausea and vomiting)   . Renal cell cancer, left [facility])     Surgical History: Past Surgical History:  Procedure Laterality Date  . ABDOMINAL HYSTERECTOMY    . ABLATION     prior to hysterectomy  . CORONARY ANGIOPLASTY    . CYSTOSCOPY/RETROGRADE/URETEROSCOPY     01-20-17 Dr. Junious Silk  . CYSTOSCOPY/RETROGRADE/URETEROSCOPY Left 01/20/2017   Procedure: CYSTOSCOPY/RETROGRADE/URETEROSCOPY/ BIOPSY,BLADDER BIOPSY PLACEMENT STENT LEFT URETER;  Surgeon: Festus Aloe, MD;  Location: [facility];  Service: Urology;  Laterality: Left;  . DILATION AND CURETTAGE OF UTERUS    . NASAL ENDOSCOPY WITH EPISTAXIS CONTROL    . ROBOTIC ASSITED PARTIAL NEPHRECTOMY Left 02/24/2017   Procedure: XI ROBOTIC ASSITED LEFT  RADICAL NEPHRECTOMY;  Surgeon: Alexis Frock, MD;  Location: [facility];  Service: Urology;  Laterality: Left;  . TUBAL LIGATION      Home Medications:  Allergies as of 10/29/2018      Reactions   Ciprofloxacin Other (See Comments)   Arms were numb   Steri-strip Compound Benzoin [benzoin Compound] Other (See Comments)   Blisters      Medication List       Accurate as of October 29, 2018  3:28 PM. If you have any questions, ask your nurse or doctor.        Advair Diskus 500-50 MCG/DOSE Aepb Generic drug: Fluticasone-Salmeterol TAKE 1 PUFF BY  MOUTH TWICE A DAY   ALPRAZolam 0.25 MG tablet Commonly known as: XANAX Take 0.25 mg by mouth at bedtime as needed for anxiety. Anxiety.   aspirin EC 81 MG tablet Take 81 mg by mouth daily.   calcitRIOL 0.25 MCG capsule Commonly known as: ROCALTROL   ciclopirox 8 % solution Commonly known as: PENLAC   estradiol 2 MG tablet Commonly known as: ESTRACE Take 2 mg by mouth daily.   levalbuterol 0.63 MG/3ML nebulizer solution Commonly known as: XOPENEX Take 3 mLs (0.63 mg total) by nebulization every 4 (four) hours as needed for wheezing or shortness of breath. Use as needed if Xopenex inhaler provides insufficient relief of shortness of breath, wheezing, chest tightness   levalbuterol 45 MCG/ACT inhaler Commonly known as: XOPENEX HFA   montelukast 10 MG tablet Commonly known as: SINGULAIR Take 10 mg by mouth daily.   olmesartan 20 MG tablet Commonly known as: BENICAR Take 20 mg by mouth daily.   pantoprazole 40 MG tablet Commonly known as: PROTONIX Take 40 mg by mouth 2 (two) times daily.   potassium chloride 10 MEQ CR capsule Commonly known as: MICRO-K Take 20 mEq by mouth daily.   Premarin vaginal cream Generic drug: conjugated estrogens Place 1 Applicatorful vaginally daily. Use pea sized amount M-W-Fr before bedtime   ranitidine 75 MG tablet Commonly known as: ZANTAC Take 75 mg by mouth 2 (two) times daily.   sucralfate 1 g tablet Commonly known as: CARAFATE Take 1 g by mouth 2 (two) times daily.   triamterene-hydrochlorothiazide 37.5-25 MG capsule Commonly known as: DYAZIDE Take 1 capsule by mouth daily.   Vitamin D3 25 MCG (1000 UT) Caps Take 1 capsule by mouth daily.       Allergies:  Allergies  Allergen Reactions  . Ciprofloxacin Other (See Comments)    Arms were numb  . Steri-Strip Compound Benzoin [Benzoin Compound] Other (See Comments)    Blisters    Family History: Family History  Problem Relation Age of Onset  . Hypertension Brother      Social History:  reports that she has never smoked. She has never used smokeless tobacco. She reports that she does not drink alcohol or use drugs.  ROS:                                        Physical Exam: LMP  (LMP Unknown)   Constitutional:  Alert and oriented, No acute distress. HEENT: [place] AT, moist mucus membranes.  Trachea midline, no masses. Cardiovascular: No clubbing, cyanosis, or edema. Respiratory: Normal respiratory effort, no increased work of breathing. GI: Abdomen is soft, nontender, nondistended, no abdominal masses GU: Grade 1 hypermobility the bladder neck and no stress incontinence or problem Skin: No rashes, bruises or suspicious lesions. Lymph: No cervical or inguinal adenopathy. Neurologic:  Grossly intact, no focal deficits, moving all 4 extremities. Psychiatric: Normal mood and affect.  Laboratory Data: Lab Results  Component Value Date   WBC 8.5 09/05/2018   HGB 11.6 (L) 09/05/2018   HCT 36.1 09/05/2018   MCV 87.4 09/05/2018   PLT 279 09/05/2018    Lab Results  Component Value Date   CREATININE 1.36 (H) 07/20/2018    No results found for: PSA  No results found for: TESTOSTERONE  No results found for: HGBA1C  Urinalysis    Component Value Date/Time   COLORURINE COLORLESS (A) 06/27/2017 1620   APPEARANCEUR Clear 10/17/2018 1126   LABSPEC 1.002 (L) 06/27/2017 1620   PHURINE 7.0 06/27/2017 1620   GLUCOSEU Negative 10/17/2018 1126   HGBUR NEGATIVE 06/27/2017 1620   BILIRUBINUR Negative 10/17/2018 1126   KETONESUR NEGATIVE 06/27/2017 1620   PROTEINUR Negative 10/17/2018 [place] 06/27/2017 1620   NITRITE Negative 10/17/2018 1126   NITRITE NEGATIVE 06/27/2017 1620   LEUKOCYTESUR Negative 10/17/2018 1126    Pertinent Imaging:   Assessment & Plan: Patient has been having recurrent bladder infections and has had a normal upper tract x-ray.  She has had a previous bladder suspension.  Some of her  urethral symptoms and altered sensation may be from up regulation from recurrent bladder infections.  The role of cystoscopy and prophylaxis described.  Eventually I think she would benefit from urodynamics by think it would be wise for the system to settle down some.  She seemed to be quite focused on her pelvic symptoms We will see in 7 weeks on daily suppression therapy 100 mg of trimethoprim.  I will see if a lot of her symptoms down regulate.  We will perform cystoscopy next visit.  We will then talk about pathophysiology of incontinence and the role of urodynamics that was not detailed today she agreed with the plan    1. Recurrent UTI   2. Stress incontinence, female  - Urinalysis, Complete   No follow-ups on file.  Reece Packer, MD  [facility] [address] [place], [place] [address] [phone]

## 2018-10-29 NOTE — Patient Instructions (Signed)
Cystoscopy Cystoscopy is a procedure that is used to help diagnose and sometimes treat conditions that affect the lower urinary tract. The lower urinary tract includes the bladder and the urethra. The urethra is the tube that drains urine from the bladder. Cystoscopy is done using a thin, tube-shaped instrument with a light and camera at the end (cystoscope). The cystoscope may be hard or flexible, depending on the goal of the procedure. The cystoscope is inserted through the urethra, into the bladder. Cystoscopy may be recommended if you have:  Urinary tract infections that keep coming back.  Blood in the urine (hematuria).  An inability to control when you urinate (urinary incontinence) or an overactive bladder.  Unusual cells found in a urine sample.  A blockage in the urethra, such as a urinary stone.  Painful urination.  An abnormality in the bladder found during an intravenous pyelogram (IVP) or CT scan. Cystoscopy may also be done to remove a sample of tissue to be examined under a microscope (biopsy). Tell a health care provider about:  Any allergies you have.  All medicines you are taking, including vitamins, herbs, eye drops, creams, and over-the-counter medicines.  Any problems you or family members have had with anesthetic medicines.  Any blood disorders you have.  Any surgeries you have had.  Any medical conditions you have.  Whether you are pregnant or may be pregnant. What are the risks? Generally, this is a safe procedure. However, problems may occur, including:  Infection.  Bleeding.  Allergic reactions to medicines.  Damage to other structures or organs. What happens before the procedure?  Ask your health care provider about: ? Changing or stopping your regular medicines. This is especially important if you are taking diabetes medicines or blood thinners. ? Taking medicines such as aspirin and ibuprofen. These medicines can thin your blood. Do not take  these medicines unless your health care provider tells you to take them. ? Taking over-the-counter medicines, vitamins, herbs, and supplements.  Follow instructions from your health care provider about eating or drinking restrictions.  Ask your health care provider what steps will be taken to help prevent infection. These may include: ? Washing skin with a germ-killing soap. ? Taking antibiotic medicine.  You may have an exam or testing, such as: ? X-rays of the bladder, urethra, or kidneys. ? Urine tests to check for signs of infection.  Plan to have someone take you home from the hospital or clinic. What happens during the procedure?   You will be given one or more of the following: ? A medicine to help you relax (sedative). ? A medicine to numb the area (local anesthetic).  The area around the opening of your urethra will be cleaned.  The cystoscope will be passed through your urethra into your bladder.  Germ-free (sterile) fluid will flow through the cystoscope to fill your bladder. The fluid will stretch your bladder so that your health care provider can clearly examine your bladder walls.  Your doctor will look at the urethra and bladder. Your doctor may take a biopsy or remove stones.  The cystoscope will be removed, and your bladder will be emptied. The procedure may vary among health care providers and hospitals. What can I expect after the procedure? After the procedure, it is common to have:  Some soreness or pain in your abdomen and urethra.  Urinary symptoms. These include: ? Mild pain or burning when you urinate. Pain should stop within a few minutes after you urinate. This   may last for up to 1 week. ? A small amount of blood in your urine for several days. ? Feeling like you need to urinate but producing only a small amount of urine. Follow these instructions at home: Medicines  Take over-the-counter and prescription medicines only as told by your health care  provider.  If you were prescribed an antibiotic medicine, take it as told by your health care provider. Do not stop taking the antibiotic even if you start to feel better. General instructions  Return to your normal activities as told by your health care provider. Ask your health care provider what activities are safe for you.  Do not drive for 24 hours if you were given a sedative during your procedure.  Watch for any blood in your urine. If the amount of blood in your urine increases, call your health care provider.  Follow instructions from your health care provider about eating or drinking restrictions.  If a tissue sample was removed for testing (biopsy) during your procedure, it is up to you to get your test results. Ask your health care provider, or the department that is doing the test, when your results will be ready.  Drink enough fluid to keep your urine pale yellow.  Keep all follow-up visits as told by your health care provider. This is important. Contact a health care provider if you:  Have pain that gets worse or does not get better with medicine, especially pain when you urinate.  Have trouble urinating.  Have more blood in your urine. Get help right away if you:  Have blood clots in your urine.  Have abdominal pain.  Have a fever or chills.  Are unable to urinate. Summary  Cystoscopy is a procedure that is used to help diagnose and sometimes treat conditions that affect the lower urinary tract.  Cystoscopy is done using a thin, tube-shaped instrument with a light and camera at the end.  After the procedure, it is common to have some soreness or pain in your abdomen and urethra.  Watch for any blood in your urine. If the amount of blood in your urine increases, call your health care provider.  If you were prescribed an antibiotic medicine, take it as told by your health care provider. Do not stop taking the antibiotic even if you start to feel better. This  information is not intended to replace advice given to you by your health care provider. Make sure you discuss any questions you have with your health care provider. Document Released: 12/18/1999 Document Revised: 12/12/2017 Document Reviewed: 12/12/2017 Elsevier Patient Education  2020 Elsevier Inc.  

## 2018-11-02 ENCOUNTER — Other Ambulatory Visit: Payer: Self-pay | Admitting: Urology

## 2018-11-02 DIAGNOSIS — N390 Urinary tract infection, site not specified: Secondary | ICD-10-CM

## 2018-12-10 ENCOUNTER — Encounter: Payer: Self-pay | Admitting: Urology

## 2018-12-10 ENCOUNTER — Ambulatory Visit (INDEPENDENT_AMBULATORY_CARE_PROVIDER_SITE_OTHER): Payer: Commercial Managed Care - PPO | Admitting: Urology

## 2018-12-10 ENCOUNTER — Other Ambulatory Visit: Payer: Self-pay

## 2018-12-10 VITALS — BP 159/96 | HR 62 | Ht 66.0 in | Wt 159.0 lb

## 2018-12-10 DIAGNOSIS — N3946 Mixed incontinence: Secondary | ICD-10-CM

## 2018-12-10 DIAGNOSIS — N390 Urinary tract infection, site not specified: Secondary | ICD-10-CM

## 2018-12-10 LAB — URINALYSIS, COMPLETE
Bilirubin, UA: NEGATIVE
Glucose, UA: NEGATIVE
Ketones, UA: NEGATIVE
Leukocytes,UA: NEGATIVE
Nitrite, UA: NEGATIVE
Protein,UA: NEGATIVE
RBC, UA: NEGATIVE
Specific Gravity, UA: 1.015 (ref 1.005–1.030)
Urobilinogen, Ur: 0.2 mg/dL (ref 0.2–1.0)
pH, UA: 7 (ref 5.0–7.5)

## 2018-12-10 LAB — MICROSCOPIC EXAMINATION
Bacteria, UA: NONE SEEN
RBC, Urine: NONE SEEN /hpf (ref 0–2)
WBC, UA: NONE SEEN /hpf (ref 0–5)

## 2018-12-10 NOTE — Progress Notes (Signed)
12/10/2018 2:44 PM   Heidi Torres [birth year] [id number]  Referring provider: [name], MD [name] [place],  [place] [address]  Chief Complaint  Patient presents with  . 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  She had an episode of significant pain in the urethra with apparently a negative culture treated with Macrodantin at [facility]  She does already have a urologist. She underwent nephrectomy by Dr. [name] on 02/2017 for left hilar renal mass c/w RCC.   She is status post hysterectomy.  She had stress incontinence affecting her quality life.  She has had a bladder suspension time of her hysterectomy.  She had a single normal right kidney on CT scan in July 2020 and culture in June within normal limits  MacD Patient leaks with coughing sneezing and sometimes bending and lifting with her bladder is full.  She has urge incontinence.  Both  significant.  She denies bedwetting.  She wears 2 pads a day and I think they are damp or a little bit more wet  She reports 3 bladder infections being treated in the last year with burning and frequency that responded favorably to antibiotics.  The urethral discomfort has happened approximately 3 times.  She has rarely taken Azo-Standard.  She reduced some bladder irritants in her diet.  Sometimes when she voids she feels almost a burning sensation that radiates upward into her abdomen   Patient has been having recurrent bladder infections and has had a normal upper tract x-ray.  She has had a previous bladder suspension.  Some of her urethral symptoms and altered sensation may be from up regulation from recurrent bladder infections.  The role of cystoscopy and prophylaxis described.  Eventually I think she would benefit from urodynamics by think it would be wise for the system to settle down some.  She seemed to be quite focused on her  pelvic symptoms  We will see in 7 weeks on daily suppression therapy 100 mg of trimethoprim.  I will see if a lot of her symptoms down regulate.  We will perform cystoscopy next visit.  We will then talk about pathophysiology of incontinence and the role of urodynamics that was not detailed today she agreed with the plan  Today Frequency stable.  Incontinence stable.  Again I think she has a mild mixed picture using 2 pads a day.  Urethral discomfort completely gone and very pleased  Cystoscopy: Patient underwent flexible cystoscopy utilizing sterile technique.  Bladder mucosa and trigone were normal.  No foreign Torres.  No carcinoma.  Ureters normal.  Grade 2 hypermobility of the bladder neck and negative cough test after cystoscopy and no prolapse   PMH: Past Medical History:  Diagnosis Date  . Asthma   . Complication of anesthesia   . GERD (gastroesophageal reflux disease)   . Headache    occular HA  . Heart murmur   . History of hiatal hernia   . Hx of dysplastic nevus    multiple sites  . Hx of squamous cell carcinoma of skin 06/22/2015   R infrascapular, SCC in situ  . Hypertension   . Irregular heart beat    pac's and pvc's  . Pneumonia    11-2016  . PONV (postoperative nausea and vomiting)   . Renal cell cancer, left [facility])     Surgical History: Past Surgical History:  Procedure Laterality Date  .  ABDOMINAL HYSTERECTOMY    . ABLATION     prior to hysterectomy  . CORONARY ANGIOPLASTY    . CYSTOSCOPY/RETROGRADE/URETEROSCOPY     01-20-17 Dr. Junious Silk  . CYSTOSCOPY/RETROGRADE/URETEROSCOPY Left 01/20/2017   Procedure: CYSTOSCOPY/RETROGRADE/URETEROSCOPY/ BIOPSY,BLADDER BIOPSY PLACEMENT STENT LEFT URETER;  Surgeon: Festus Aloe, MD;  Location: [facility];  Service: Urology;  Laterality: Left;  . DILATION AND CURETTAGE OF UTERUS    . NASAL ENDOSCOPY WITH EPISTAXIS CONTROL    . ROBOTIC ASSITED PARTIAL NEPHRECTOMY Left 02/24/2017   Procedure: XI ROBOTIC ASSITED LEFT   RADICAL NEPHRECTOMY;  Surgeon: Alexis Frock, MD;  Location: [facility];  Service: Urology;  Laterality: Left;  . TUBAL LIGATION      Home Medications:  Allergies as of 12/10/2018      Reactions   Ciprofloxacin Other (See Comments)   Arms were numb   Steri-strip Compound Benzoin [benzoin Compound] Other (See Comments)   Blisters      Medication List       Accurate as of December 10, 2018  2:44 PM. If you have any questions, ask your nurse or doctor.        STOP taking these medications   ciclopirox 8 % solution Commonly known as: PENLAC Stopped by: Reece Packer, MD   estradiol 2 MG tablet Commonly known as: ESTRACE Stopped by: Reece Packer, MD     TAKE these medications   Advair Diskus 500-50 MCG/DOSE Aepb Generic drug: Fluticasone-Salmeterol TAKE 1 PUFF BY MOUTH TWICE A DAY   ALPRAZolam 0.25 MG tablet Commonly known as: XANAX Take 0.25 mg by mouth at bedtime as needed for anxiety. Anxiety.   aspirin EC 81 MG tablet Take 81 mg by mouth daily.   calcitRIOL 0.25 MCG capsule Commonly known as: ROCALTROL   levalbuterol 0.63 MG/3ML nebulizer solution Commonly known as: XOPENEX Take 3 mLs (0.63 mg total) by nebulization every 4 (four) hours as needed for wheezing or shortness of breath. Use as needed if Xopenex inhaler provides insufficient relief of shortness of breath, wheezing, chest tightness   levalbuterol 45 MCG/ACT inhaler Commonly known as: XOPENEX HFA   montelukast 10 MG tablet Commonly known as: SINGULAIR Take 10 mg by mouth daily.   olmesartan 20 MG tablet Commonly known as: BENICAR Take 20 mg by mouth daily.   pantoprazole 40 MG tablet Commonly known as: PROTONIX Take 40 mg by mouth 2 (two) times daily.   potassium chloride 10 MEQ CR capsule Commonly known as: MICRO-K Take 20 mEq by mouth daily.   Premarin vaginal cream Generic drug: conjugated estrogens PLACE 1 APPLICATORFUL VAGINALLY DAILY. USE PEA SIZED AMOUNT M-W-FR BEFORE  BEDTIME   ranitidine 75 MG tablet Commonly known as: ZANTAC Take 75 mg by mouth 2 (two) times daily.   sucralfate 1 g tablet Commonly known as: CARAFATE Take 1 g by mouth 2 (two) times daily.   triamterene-hydrochlorothiazide 37.5-25 MG capsule Commonly known as: DYAZIDE Take 1 capsule by mouth daily.   trimethoprim 100 MG tablet Commonly known as: TRIMPEX Take 1 tablet (100 mg total) by mouth daily.   Vitamin D3 25 MCG (1000 UT) Caps Take 1 capsule by mouth daily.       Allergies:  Allergies  Allergen Reactions  . Ciprofloxacin Other (See Comments)    Arms were numb  . Steri-Strip Compound Benzoin [Benzoin Compound] Other (See Comments)    Blisters    Family History: Family History  Problem Relation Age of Onset  . Hypertension Brother     Social  History:  reports that she has never smoked. She has never used smokeless tobacco. She reports that she does not drink alcohol or use drugs.  ROS:                                        Physical Exam: BP (!) 159/96   Pulse 62   Ht 5\' 6"  (1.676 m)   Wt 72.1 kg   LMP  (LMP Unknown)   BMI 25.66 kg/m   Constitutional:  Alert and oriented, No acute distress.   Laboratory Data: Lab Results  Component Value Date   WBC 8.5 09/05/2018   HGB 11.6 (L) 09/05/2018   HCT 36.1 09/05/2018   MCV 87.4 09/05/2018   PLT 279 09/05/2018    Lab Results  Component Value Date   CREATININE 1.36 (H) 07/20/2018    No results found for: PSA  No results found for: TESTOSTERONE  No results found for: HGBA1C  Urinalysis    Component Value Date/Time   COLORURINE COLORLESS (A) 06/27/2017 1620   APPEARANCEUR Clear 10/17/2018 1126   LABSPEC 1.002 (L) 06/27/2017 1620   PHURINE 7.0 06/27/2017 1620   GLUCOSEU Negative 10/17/2018 1126   HGBUR NEGATIVE 06/27/2017 1620   BILIRUBINUR Negative 10/17/2018 1126   KETONESUR NEGATIVE 06/27/2017 1620   PROTEINUR Negative 10/17/2018 [place]  06/27/2017 1620   NITRITE Negative 10/17/2018 1126   NITRITE NEGATIVE 06/27/2017 1620   LEUKOCYTESUR Negative 10/17/2018 1126    Pertinent Imaging:   Assessment & Plan: Continue patient on prophylaxis for urethral discomfort.  The role of urodynamics discussed.  Tends to focus a lot on her likely secondary urethritis.  She may want urodynamics in the future but we agreed to reassess goals in 4 months  1. Recurrent UTI  - Urinalysis, Complete   Return in about 4 months (around 04/10/2019).  Reece Packer, MD  [facility] [address], [place], [place] [address] [phone]

## 2019-01-07 ENCOUNTER — Other Ambulatory Visit
Admission: RE | Admit: 2019-01-07 | Discharge: 2019-01-07 | Disposition: A | Discharge status: Home / Self Care | Payer: Commercial Managed Care - PPO | Source: Ambulatory Visit | Attending: Internal Medicine | Admitting: Internal Medicine

## 2019-01-07 ENCOUNTER — Telehealth: Payer: Self-pay | Admitting: Urology

## 2019-01-07 DIAGNOSIS — Z01812 Encounter for preprocedural laboratory examination: Secondary | ICD-10-CM | POA: Insufficient documentation

## 2019-01-07 DIAGNOSIS — Z20822 Contact with and (suspected) exposure to covid-19: Secondary | ICD-10-CM | POA: Insufficient documentation

## 2019-01-07 LAB — SARS CORONAVIRUS 2 (TAT 6-24 HRS): SARS Coronavirus 2: NEGATIVE

## 2019-01-07 NOTE — Telephone Encounter (Signed)
ERROR

## 2019-01-08 ENCOUNTER — Encounter: Payer: Self-pay | Admitting: Internal Medicine

## 2019-01-09 ENCOUNTER — Ambulatory Visit: Payer: Commercial Managed Care - PPO | Admitting: Anesthesiology

## 2019-01-09 ENCOUNTER — Other Ambulatory Visit: Payer: Self-pay

## 2019-01-09 ENCOUNTER — Encounter: Payer: Self-pay | Admitting: Internal Medicine

## 2019-01-09 ENCOUNTER — Other Ambulatory Visit: Payer: Self-pay | Admitting: Internal Medicine

## 2019-01-09 ENCOUNTER — Encounter
Admission: RE | Disposition: A | Discharge status: Home / Self Care | Payer: Self-pay | Source: Home / Self Care | Attending: Internal Medicine

## 2019-01-09 ENCOUNTER — Ambulatory Visit
Admission: RE | Admit: 2019-01-09 | Discharge: 2019-01-09 | Disposition: A | Discharge status: Home / Self Care | Payer: Commercial Managed Care - PPO | Attending: Internal Medicine | Admitting: Internal Medicine

## 2019-01-09 DIAGNOSIS — Z91048 Other nonmedicinal substance allergy status: Secondary | ICD-10-CM | POA: Diagnosis not present

## 2019-01-09 DIAGNOSIS — R011 Cardiac murmur, unspecified: Secondary | ICD-10-CM | POA: Diagnosis not present

## 2019-01-09 DIAGNOSIS — Z7982 Long term (current) use of aspirin: Secondary | ICD-10-CM | POA: Diagnosis not present

## 2019-01-09 DIAGNOSIS — Z881 Allergy status to other antibiotic agents status: Secondary | ICD-10-CM | POA: Diagnosis not present

## 2019-01-09 DIAGNOSIS — K2970 Gastritis, unspecified, without bleeding: Secondary | ICD-10-CM | POA: Diagnosis not present

## 2019-01-09 DIAGNOSIS — K222 Esophageal obstruction: Secondary | ICD-10-CM | POA: Diagnosis not present

## 2019-01-09 DIAGNOSIS — I10 Essential (primary) hypertension: Secondary | ICD-10-CM | POA: Diagnosis not present

## 2019-01-09 DIAGNOSIS — Z7951 Long term (current) use of inhaled steroids: Secondary | ICD-10-CM | POA: Diagnosis not present

## 2019-01-09 DIAGNOSIS — Z85828 Personal history of other malignant neoplasm of skin: Secondary | ICD-10-CM | POA: Insufficient documentation

## 2019-01-09 DIAGNOSIS — Z79899 Other long term (current) drug therapy: Secondary | ICD-10-CM | POA: Insufficient documentation

## 2019-01-09 DIAGNOSIS — J45909 Unspecified asthma, uncomplicated: Secondary | ICD-10-CM | POA: Diagnosis not present

## 2019-01-09 DIAGNOSIS — R1310 Dysphagia, unspecified: Secondary | ICD-10-CM | POA: Diagnosis present

## 2019-01-09 DIAGNOSIS — K200 Eosinophilic esophagitis: Secondary | ICD-10-CM | POA: Diagnosis not present

## 2019-01-09 DIAGNOSIS — L83 Acanthosis nigricans: Secondary | ICD-10-CM | POA: Insufficient documentation

## 2019-01-09 DIAGNOSIS — K449 Diaphragmatic hernia without obstruction or gangrene: Secondary | ICD-10-CM | POA: Insufficient documentation

## 2019-01-09 DIAGNOSIS — Z85528 Personal history of other malignant neoplasm of kidney: Secondary | ICD-10-CM | POA: Diagnosis not present

## 2019-01-09 DIAGNOSIS — K317 Polyp of stomach and duodenum: Secondary | ICD-10-CM | POA: Diagnosis not present

## 2019-01-09 HISTORY — PX: ESOPHAGOGASTRODUODENOSCOPY (EGD) WITH PROPOFOL: SHX5813

## 2019-01-09 SURGERY — ESOPHAGOGASTRODUODENOSCOPY (EGD) WITH PROPOFOL
Anesthesia: General

## 2019-01-09 MED ORDER — PROPOFOL 500 MG/50ML IV EMUL
INTRAVENOUS | Status: AC
  Filled 2019-01-09: qty 50

## 2019-01-09 MED ORDER — PROPOFOL 10 MG/ML IV BOLUS
INTRAVENOUS | Status: DC | PRN
  Administered 2019-01-09: 50 mg via INTRAVENOUS

## 2019-01-09 MED ORDER — SODIUM CHLORIDE 0.9 % IV SOLN
INTRAVENOUS | Status: DC
  Administered 2019-01-09: 1000 mL via INTRAVENOUS

## 2019-01-09 MED ORDER — PROPOFOL 500 MG/50ML IV EMUL
INTRAVENOUS | Status: DC | PRN
  Administered 2019-01-09: 175 ug/kg/min via INTRAVENOUS

## 2019-01-09 MED ORDER — ALBUTEROL SULFATE HFA 108 (90 BASE) MCG/ACT IN AERS
INHALATION_SPRAY | RESPIRATORY_TRACT | Status: DC | PRN
  Administered 2019-01-09: 2 via RESPIRATORY_TRACT

## 2019-01-09 NOTE — Transfer of Care (Signed)
Immediate Anesthesia Transfer of Care Note  Patient: Heidi Torres  Procedure(s) Performed: ESOPHAGOGASTRODUODENOSCOPY (EGD) WITH PROPOFOL (N/A )  Patient Location: PACU  Anesthesia Type:General  Level of Consciousness: awake, alert  and oriented  Airway & Oxygen Therapy: Patient Spontanous Breathing and Patient connected to face mask oxygen  Post-op Assessment: Report given to RN and Post -op Vital signs reviewed and stable  Post vital signs: Reviewed and stable  Last Vitals:  Vitals Value Taken Time  BP 119/56 01/09/19 0848  Temp    Pulse 71 01/09/19 0852  Resp 14 01/09/19 0852  SpO2 100 % 01/09/19 0852  Vitals shown include unvalidated device data.  Last Pain:  Vitals:   01/09/19 0848  TempSrc:   PainSc: 0-No pain         Complications: No apparent anesthesia complications

## 2019-01-09 NOTE — Anesthesia Postprocedure Evaluation (Signed)
Anesthesia Post Note  Patient: Heidi Torres  Procedure(s) Performed: ESOPHAGOGASTRODUODENOSCOPY (EGD) WITH PROPOFOL (N/A )  Patient location during evaluation: PACU Anesthesia Type: General Level of consciousness: awake and alert Pain management: pain level controlled Vital Signs Assessment: post-procedure vital signs reviewed and stable Respiratory status: spontaneous breathing, nonlabored ventilation, respiratory function stable and patient connected to nasal cannula oxygen Cardiovascular status: blood pressure returned to baseline and stable Postop Assessment: no apparent nausea or vomiting Anesthetic complications: no     Last Vitals:  Vitals:   01/09/19 0848 01/09/19 0858  BP: (!) 119/56 115/67  Pulse: 76   Resp: 15   Temp:    SpO2: 100%     Last Pain:  Vitals:   01/09/19 0858  TempSrc:   PainSc: 0-No pain                 Arita Miss

## 2019-01-09 NOTE — Op Note (Signed)
[facility] Gastroenterology Patient Name: Heidi Torres Procedure Date: 01/09/2019 7:29 AM MRN: [id number] Account #: [id number] Date of Birth: 05/30/1955 Admit Type: Outpatient Age: 64 Room: [facility] ENDO ROOM 3 Gender: Female Note Status: Finalized Procedure:             Upper GI endoscopy Indications:           Epigastric abdominal pain, Abdominal pain in the left                         upper quadrant, Dysphagia, Follow-up of esophageal                         reflux, Failure to respond to medical treatment Providers:             Benay Pike. Alice Reichert MD, MD Referring MD:          [name] (Referring MD) Medicines:             Propofol per Anesthesia Complications:         No immediate complications. Procedure:             Pre-Anesthesia Assessment:                        - The risks and benefits of the procedure and the                         sedation options and risks were discussed with the                         patient. All questions were answered and informed                         consent was obtained.                        - Patient identification and proposed procedure were                         verified prior to the procedure by the nurse. The                         procedure was verified in the procedure room.                        - ASA Grade Assessment: III - A patient with severe                         systemic disease.                        - After reviewing the risks and benefits, the patient                         was deemed in satisfactory condition to undergo the                         procedure.                        After obtaining  informed consent, the endoscope was                         passed under direct vision. Throughout the procedure,                         the patient's blood pressure, pulse, and oxygen                         saturations were monitored continuously. The Endoscope                         was  introduced through the mouth, and advanced to the                         third part of duodenum. The upper GI endoscopy was                         accomplished without difficulty. The patient tolerated                         the procedure well. Findings:      The hypopharynx was normal.      Mucosal changes including feline appearance were found in the entire       esophagus. Biopsies were obtained from the proximal and distal esophagus       with cold forceps for histology of suspected eosinophilic esophagitis.      One benign-appearing, intrinsic moderate (circumferential scarring or       stenosis; an endoscope may pass) stenosis was found at the       gastroesophageal junction. This stenosis measured 1.4 cm (inner       diameter) x less than one cm (in length). The stenosis was traversed.       The scope was withdrawn. Dilation was performed with a Maloney dilator       with mild resistance at 67 Fr.      Patchy minimal inflammation characterized by erythema was found in the       gastric antrum. Biopsies were taken with a cold forceps for Helicobacter       pylori testing.      Multiple 3 to 8 mm pedunculated and sessile polyps with no bleeding and       no stigmata of recent bleeding were found in the gastric body. Biopsies       were taken with a cold forceps for histology.      The examined duodenum was normal.      The exam was otherwise without abnormality. Impression:            - Normal hypopharynx.                        - Esophageal mucosal changes suspicious for                         eosinophilic esophagitis. Biopsied.                        - Benign-appearing esophageal stenosis. Dilated.                        -  Gastritis. Biopsied.                        - Multiple gastric polyps. Biopsied.                        - Normal examined duodenum.                        - The examination was otherwise normal. Recommendation:        - Patient has a contact number  available for                         emergencies. The signs and symptoms of potential                         delayed complications were discussed with the patient.                         Return to normal activities tomorrow. Written                         discharge instructions were provided to the patient.                        - Resume previous diet.                        - Continue present medications.                        - Await pathology results.                        - Monitor results to esophageal dilation                        - Return to nurse practitioner in 6 weeks.                        - Follow up with Stephens November, GI Nurse                         Practioner, in office to discuss results and monitor                         progress. Procedure Code(s):     --- Professional ---                        [phone], Esophagogastroduodenoscopy, flexible,                         transoral; with biopsy, single or multiple                        [id number], Dilation of esophagus, by unguided sound or                         bougie, single or multiple passes Diagnosis Code(s):     --- Professional ---  K21.9, Gastro-esophageal reflux disease without                         esophagitis                        R13.10, Dysphagia, unspecified                        R10.12, Left upper quadrant pain                        R10.13, Epigastric pain                        K31.7, Polyp of stomach and duodenum                        K29.70, Gastritis, unspecified, without bleeding                        K22.8, Other specified diseases of esophagus                        K22.2, Esophageal obstruction CPT copyright 2019 [employer]. All rights reserved. The codes documented in this report are preliminary and upon coder review may  be revised to meet current compliance requirements. Efrain Sella MD, MD 01/09/2019 8:49:45 AM This report has been  signed electronically. Number of Addenda: 0 Note Initiated On: 01/09/2019 7:29 AM Estimated Blood Loss:  Estimated blood loss: none.      [facility]

## 2019-01-09 NOTE — Interval H&P Note (Signed)
History and Physical Interval Note:  01/09/2019 8:31 AM  Heidi Torres  has presented today for surgery, with the diagnosis of DYSPEPSIA,DYSPHAGIA,LUQ PAIN,COLON CANCER SCREENING.  The various methods of treatment have been discussed with the patient and family. After consideration of risks, benefits and other options for treatment, the patient has consented to  Procedure(s): ESOPHAGOGASTRODUODENOSCOPY (EGD) WITH PROPOFOL (N/A) as a surgical intervention.  The patient's history has been reviewed, patient examined, no change in status, stable for surgery.  I have reviewed the patient's chart and labs.  Questions were answered to the patient's satisfaction.     [place], [place]

## 2019-01-09 NOTE — Anesthesia Preprocedure Evaluation (Signed)
Anesthesia Evaluation  Patient identified by MRN, date of birth, ID band Patient awake    Reviewed: Allergy & Precautions, NPO status , Patient's Chart, lab work & pertinent test results  History of Anesthesia Complications (+) PONVNegative for: history of anesthetic complications  Airway Mallampati: II  TM Distance: >3 FB Neck ROM: Full    Dental no notable dental hx. (+) Teeth Intact   Pulmonary asthma , neg COPD, Patient abstained from smoking.Not current smoker,  On regular advair, rescue inhaler 3x per week. Hospitalized long time ago once   Pulmonary exam normal breath sounds clear to auscultation       Cardiovascular Exercise Tolerance: Good METShypertension, (-) CAD and (-) Past MI + Valvular Problems/Murmurs  Rhythm:Regular Rate:Normal - Systolic murmurs    Neuro/Psych negative neurological ROS  negative psych ROS   GI/Hepatic Neg liver ROS, hiatal hernia, GERD  Poorly Controlled,  Endo/Other  neg diabetes  Renal/GU Renal diseaseHx renal cancer s/p nephrectomy     Musculoskeletal   Abdominal   Peds  Hematology   Anesthesia Other Findings   Reproductive/Obstetrics                             Anesthesia Physical Anesthesia Plan  ASA: II  Anesthesia Plan: General   Post-op Pain Management:    Induction: Intravenous  PONV Risk Score and Plan: 4 or greater and Ondansetron, Propofol infusion and TIVA  Airway Management Planned: Nasal Cannula  Additional Equipment: None  Intra-op Plan:   Post-operative Plan:   Informed Consent: I have reviewed the patients History and Physical, chart, labs and discussed the procedure including the risks, benefits and alternatives for the proposed anesthesia with the patient or authorized representative who has indicated his/her understanding and acceptance.     Dental advisory given  Plan Discussed with: CRNA and Surgeon  Anesthesia  Plan Comments: (Discussed risks of anesthesia with patient, including possibility of difficulty with spontaneous ventilation under anesthesia necessitating airway intervention, PONV, and rare risks such as cardiac or respiratory events. Patient understands.  Will give albuterol inhaler preop)        Anesthesia Quick Evaluation

## 2019-01-09 NOTE — H&P (Signed)
Outpatient short stay form Pre-procedure 01/09/2019 8:28 AM Heidi Torres K. Alice Reichert, M.D.  Primary Physician: [name], M.D.  Reason for visit:  GERSD, Dyspepsia, dysphagia (globus), epigastric RUQ pain.  History of present illness: As above. Patient having suboptimal response to PPI and H2 blocker. Not able to tolerate swallowing carafate. No hemetemesis, weight loss, hematochezia.     Current Facility-Administered Medications:  .  0.9 %  sodium chloride infusion, , Intravenous, Continuous, [place], Benay Pike, MD, Last Rate: 20 mL/hr at 01/09/19 0818, 1,000 mL at 01/09/19 0818  Medications Prior to Admission  Medication Sig Dispense Refill Last Dose  . ADVAIR DISKUS 500-50 MCG/DOSE AEPB TAKE 1 PUFF BY MOUTH TWICE A DAY 60 each 0 01/08/2019 at Unknown time  . ALPRAZolam (XANAX) 0.25 MG tablet Take 0.25 mg by mouth at bedtime as needed for anxiety. Anxiety.  5 01/09/2019 at 0200  . aspirin EC 81 MG tablet Take 81 mg by mouth daily.   Past Week at Unknown time  . calcitRIOL (ROCALTROL) 0.25 MCG capsule    Past Week at Unknown time  . Cholecalciferol (VITAMIN D3) 1000 units CAPS Take 1 capsule by mouth daily.   Past Week at Unknown time  . ipratropium-albuterol (DUONEB) 0.5-2.5 (3) MG/3ML SOLN Take 3 mLs by nebulization.     . montelukast (SINGULAIR) 10 MG tablet Take 10 mg by mouth daily.   Past Week at Unknown time  . olmesartan (BENICAR) 20 MG tablet Take 20 mg by mouth daily.   01/08/2019 at 0615  . pantoprazole (PROTONIX) 40 MG tablet Take 40 mg by mouth 2 (two) times daily.   Past Week at Unknown time  . potassium chloride (MICRO-K) 10 MEQ CR capsule Take 20 mEq by mouth daily.  11 Past Week at Unknown time  . PREMARIN vaginal cream PLACE 1 APPLICATORFUL VAGINALLY DAILY. USE PEA SIZED AMOUNT M-W-FR BEFORE BEDTIME 90 g 5 Past Week at Unknown time  . ranitidine (ZANTAC) 75 MG tablet Take 75 mg by mouth 2 (two) times daily.   Past Week at Unknown time  . triamterene-hydrochlorothiazide  (DYAZIDE) 37.5-25 MG per capsule Take 1 capsule by mouth daily.   Past Week at Unknown time  . trimethoprim (TRIMPEX) 100 MG tablet Take 1 tablet (100 mg total) by mouth daily. 30 tablet 11 Past Week at Unknown time  . levalbuterol (XOPENEX HFA) 45 MCG/ACT inhaler      . levalbuterol (XOPENEX) 0.63 MG/3ML nebulizer solution Take 3 mLs (0.63 mg total) by nebulization every 4 (four) hours as needed for wheezing or shortness of breath. Use as needed if Xopenex inhaler provides insufficient relief of shortness of breath, wheezing, chest tightness 30 mL 5   . sucralfate (CARAFATE) 1 G tablet Take 1 g by mouth 2 (two) times daily.    Not Taking at Unknown time     Allergies  Allergen Reactions  . Ciprofloxacin Other (See Comments)    Arms were numb  . Steri-Strip Compound Benzoin [Benzoin Compound] Other (See Comments)    Blisters     Past Medical History:  Diagnosis Date  . Asthma   . Complication of anesthesia   . GERD (gastroesophageal reflux disease)   . Headache    occular HA  . Heart murmur   . History of hiatal hernia   . Hx of dysplastic nevus    multiple sites  . Hx of squamous cell carcinoma of skin 06/22/2015   R infrascapular, SCC in situ  . Hypertension   . Irregular heart  beat    pac's and pvc's  . Pneumonia    11-2016  . PONV (postoperative nausea and vomiting)   . Renal cell cancer, left ([place])     Review of systems:  Otherwise negative.    Physical Exam  Gen: Alert, oriented. Appears stated age.  HEENT: [place]/AT. PERRLA. Lungs: CTA, no wheezes. CV: RR nl S1, S2. Abd: soft, benign, no masses. BS+ Ext: No edema. Pulses 2+    Planned procedures: Proceed with EGD. The patient understands the nature of the planned procedure, indications, risks, alternatives and potential complications including but not limited to bleeding, infection, perforation, damage to internal organs and possible oversedation/side effects from anesthesia. The patient agrees and gives  consent to proceed.  Please refer to procedure notes for findings, recommendations and patient disposition/instructions.     Heidi Torres K. Alice Reichert, M.D. Gastroenterology 01/09/2019  8:28 AM

## 2019-01-10 ENCOUNTER — Telehealth: Payer: Self-pay | Admitting: *Deleted

## 2019-01-10 LAB — SURGICAL PATHOLOGY

## 2019-01-10 NOTE — Telephone Encounter (Signed)
Abd ct should include that area.

## 2019-01-10 NOTE — Telephone Encounter (Signed)
Patient informed of doctor response 

## 2019-01-10 NOTE — Telephone Encounter (Signed)
Patient called requesting that her CT include just below her ribs as she has been having pain bilateral at this area. She is scheduled for CT abdomen/ pelvis

## 2019-01-11 ENCOUNTER — Ambulatory Visit
Admission: RE | Admit: 2019-01-11 | Discharge: 2019-01-11 | Disposition: A | Discharge status: Home / Self Care | Payer: Commercial Managed Care - PPO | Source: Ambulatory Visit | Attending: Gastroenterology | Admitting: Gastroenterology

## 2019-01-11 ENCOUNTER — Other Ambulatory Visit: Payer: Self-pay | Admitting: Gastroenterology

## 2019-01-11 DIAGNOSIS — R079 Chest pain, unspecified: Secondary | ICD-10-CM

## 2019-01-11 LAB — PATHOLOGY

## 2019-01-23 ENCOUNTER — Other Ambulatory Visit: Payer: Self-pay

## 2019-01-23 ENCOUNTER — Inpatient Hospital Stay: Payer: Commercial Managed Care - PPO | Attending: Oncology

## 2019-01-23 DIAGNOSIS — Z85528 Personal history of other malignant neoplasm of kidney: Secondary | ICD-10-CM | POA: Insufficient documentation

## 2019-01-23 DIAGNOSIS — C649 Malignant neoplasm of unspecified kidney, except renal pelvis: Secondary | ICD-10-CM

## 2019-01-23 LAB — COMPREHENSIVE METABOLIC PANEL
ALT: 29 U/L (ref 0–44)
AST: 23 U/L (ref 15–41)
Albumin: 4.5 g/dL (ref 3.5–5.0)
Alkaline Phosphatase: 52 U/L (ref 38–126)
Anion gap: 8 (ref 5–15)
BUN: 30 mg/dL — ABNORMAL HIGH (ref 8–23)
CO2: 26 mmol/L (ref 22–32)
Calcium: 9.7 mg/dL (ref 8.9–10.3)
Chloride: 102 mmol/L (ref 98–111)
Creatinine, Ser: 1.4 mg/dL — ABNORMAL HIGH (ref 0.44–1.00)
GFR calc Af Amer: 46 mL/min — ABNORMAL LOW (ref >60)
GFR calc non Af Amer: 40 mL/min — ABNORMAL LOW (ref >60)
Glucose, Bld: 96 mg/dL (ref 70–99)
Potassium: 4.3 mmol/L (ref 3.5–5.1)
Sodium: 136 mmol/L (ref 135–145)
Total Bilirubin: 0.6 mg/dL (ref 0.3–1.2)
Total Protein: 7.7 g/dL (ref 6.5–8.1)

## 2019-01-24 ENCOUNTER — Inpatient Hospital Stay: Payer: Commercial Managed Care - PPO

## 2019-01-24 ENCOUNTER — Ambulatory Visit
Admission: RE | Admit: 2019-01-24 | Discharge: 2019-01-24 | Disposition: A | Discharge status: Home / Self Care | Payer: Commercial Managed Care - PPO | Source: Ambulatory Visit | Attending: Oncology | Admitting: Oncology

## 2019-01-24 DIAGNOSIS — C642 Malignant neoplasm of left kidney, except renal pelvis: Secondary | ICD-10-CM | POA: Insufficient documentation

## 2019-01-26 NOTE — Progress Notes (Signed)
[place]  Telephone:([phone]) [phone] Fax:(336) [phone]  ID: Heidi Torres OB: 1955/01/09  MR#: [id number]  [id number]  Patient Care Team: [name], MD as PCP - General (Family Medicine) Lloyd Huger, MD as Medical Oncologist (Oncology)  I connected with Heidi Torres on 01/29/19 at  1:15 PM EST by video enabled telemedicine visit and verified that I am speaking with the correct person using two identifiers.   I discussed the limitations, risks, security and privacy concerns of performing an evaluation and management service by telemedicine and the availability of in-person appointments. I also discussed with the patient that there may be a patient responsible charge related to this service. The patient expressed understanding and agreed to proceed.   Other persons participating in the visit and their role in the encounter: Patient, MD.  Patient's location: Home. Provider's location: Clinic.  CHIEF COMPLAINT: Stage III clear cell renal cell carcinoma of the left kidney  INTERVAL HISTORY: Patient agreed to video enabled telemedicine visit for further evaluation and discussion of her imaging results.  She continues to feel well and remains asymptomatic.  She has no neurologic complaints.  She denies any recent fevers or illnesses.  She has a fair appetite, but denies weight loss.  She denies any chest pain, shortness of breath, cough, or hemoptysis.  She has no nausea, vomiting, constipation, or diarrhea.  She has no urinary complaints.  Patient offers no specific complaints today.  REVIEW OF SYSTEMS:   Review of Systems  Constitutional: Negative.  Negative for fever, malaise/fatigue and weight loss.  Respiratory: Negative.  Negative for cough and shortness of breath.   Cardiovascular: Negative.  Negative for chest pain and leg swelling.  Gastrointestinal: Negative.  Negative for abdominal pain, blood in stool, diarrhea and melena.    Genitourinary: Negative.  Negative for dysuria, flank pain and hematuria.  Musculoskeletal: Negative.  Negative for back pain.  Skin: Negative.  Negative for rash.  Neurological: Negative.  Negative for sensory change, focal weakness, weakness and headaches.  Psychiatric/Behavioral: Negative.  The patient is not nervous/anxious.     As per HPI. Otherwise, a complete review of systems is negative.  PAST MEDICAL HISTORY: Past Medical History:  Diagnosis Date  . Asthma   . Complication of anesthesia   . GERD (gastroesophageal reflux disease)   . Headache    occular HA  . Heart murmur   . History of hiatal hernia   . Hx of dysplastic nevus    multiple sites  . Hx of squamous cell carcinoma of skin 06/22/2015   R infrascapular, SCC in situ  . Hypertension   . Irregular heart beat    pac's and pvc's  . Pneumonia    11-2016  . PONV (postoperative nausea and vomiting)   . Renal cell cancer, left ([place])     PAST SURGICAL HISTORY: Past Surgical History:  Procedure Laterality Date  . ABDOMINAL HYSTERECTOMY    . ABLATION     prior to hysterectomy  . CYSTOSCOPY/RETROGRADE/URETEROSCOPY     01-20-17 Dr. Junious Silk  . CYSTOSCOPY/RETROGRADE/URETEROSCOPY Left 01/20/2017   Procedure: CYSTOSCOPY/RETROGRADE/URETEROSCOPY/ BIOPSY,BLADDER BIOPSY PLACEMENT STENT LEFT URETER;  Surgeon: Festus Aloe, MD;  Location: [facility];  Service: Urology;  Laterality: Left;  . DILATION AND CURETTAGE OF UTERUS    . ESOPHAGOGASTRODUODENOSCOPY (EGD) WITH PROPOFOL N/A 01/09/2019   Procedure: ESOPHAGOGASTRODUODENOSCOPY (EGD) WITH PROPOFOL;  Surgeon: Toledo, Benay Pike, MD;  Location: [facility];  Service: Gastroenterology;  Laterality: N/A;  . NASAL ENDOSCOPY WITH EPISTAXIS CONTROL    .  ROBOTIC ASSITED PARTIAL NEPHRECTOMY Left 02/24/2017   Procedure: XI ROBOTIC ASSITED LEFT  RADICAL NEPHRECTOMY;  Surgeon: Alexis Frock, MD;  Location: [facility];  Service: Urology;  Laterality: Left;  . TUBAL LIGATION       FAMILY HISTORY: Family History  Problem Relation Age of Onset  . Hypertension Brother     ADVANCED DIRECTIVES (Y/N):  N  HEALTH MAINTENANCE: Social History   Tobacco Use  . Smoking status: Never Smoker  . Smokeless tobacco: Never Used  Substance Use Topics  . Alcohol use: No  . Drug use: Never     Colonoscopy:  PAP:  Bone density:  Lipid panel:  Allergies  Allergen Reactions  . Benzoin     Other reaction(s): Other (See Comments) Blisters  . Ciprofloxacin Other (See Comments)    Arms were numb  . Steri-Strip Compound Benzoin [Benzoin Compound] Other (See Comments)    Blisters    Current Outpatient Medications  Medication Sig Dispense Refill  . atorvastatin (LIPITOR) 40 MG tablet TAKE 1 TABLET BY MOUTH EVERY DAY AT NIGHT    . estradiol (ESTRACE) 2 MG tablet Comments:   Filled Date: Jan 19 2017 12:00AM  Patient Notes: TAKE 1 TABLET BY MOUTH EVERY DAY FOR HORMONE  Duration: 90    . ADVAIR DISKUS 500-50 MCG/DOSE AEPB TAKE 1 PUFF BY MOUTH TWICE A DAY 60 each 0  . albuterol (VENTOLIN HFA) 108 (90 Base) MCG/ACT inhaler     . ALPRAZolam (XANAX) 0.25 MG tablet Take 0.25 mg by mouth at bedtime as needed for anxiety. Anxiety.  5  . aspirin EC 81 MG tablet Take 81 mg by mouth daily.    . calcitRIOL (ROCALTROL) 0.25 MCG capsule     . Cholecalciferol (VITAMIN D3) 1000 units CAPS Take 1 capsule by mouth daily.    . fluticasone (FLONASE) 50 MCG/ACT nasal spray Place 2 sprays into both nostrils daily.    Marland Kitchen ipratropium-albuterol (DUONEB) 0.5-2.5 (3) MG/3ML SOLN Take 3 mLs by nebulization.    Marland Kitchen levalbuterol (XOPENEX HFA) 45 MCG/ACT inhaler     . levalbuterol (XOPENEX) 0.63 MG/3ML nebulizer solution Take 3 mLs (0.63 mg total) by nebulization every 4 (four) hours as needed for wheezing or shortness of breath. Use as needed if Xopenex inhaler provides insufficient relief of shortness of breath, wheezing, chest tightness 30 mL 5  . metoprolol succinate (TOPROL-XL) 50 MG 24 hr tablet  as needed   Comments:   Filled Date: Feb 20 2017 12:00AM  Duration: 90    . montelukast (SINGULAIR) 10 MG tablet Take 10 mg by mouth daily.    Marland Kitchen olmesartan (BENICAR) 20 MG tablet Take 20 mg by mouth daily.    . pantoprazole (PROTONIX) 40 MG tablet Take 40 mg by mouth 2 (two) times daily.    . potassium chloride (MICRO-K) 10 MEQ CR capsule Take 20 mEq by mouth daily.  11  . PREMARIN vaginal cream PLACE 1 APPLICATORFUL VAGINALLY DAILY. USE PEA SIZED AMOUNT M-W-FR BEFORE BEDTIME 90 g 5  . ranitidine (ZANTAC) 75 MG tablet Take 75 mg by mouth 2 (two) times daily.    . sucralfate (CARAFATE) 1 G tablet Take 1 g by mouth 2 (two) times daily.     Marland Kitchen triamterene-hydrochlorothiazide (DYAZIDE) 37.5-25 MG per capsule Take 1 capsule by mouth daily.    Marland Kitchen trimethoprim (TRIMPEX) 100 MG tablet Take 1 tablet (100 mg total) by mouth daily. 30 tablet 11   No current facility-administered medications for this visit.    OBJECTIVE:  There were no vitals filed for this visit.   There is no height or weight on file to calculate BMI.    ECOG FS:0 - Asymptomatic   LAB RESULTS:  Lab Results  Component Value Date   NA 136 01/23/2019   K 4.3 01/23/2019   CL 102 01/23/2019   CO2 26 01/23/2019   GLUCOSE 96 01/23/2019   BUN 30 (H) 01/23/2019   CREATININE 1.40 (H) 01/23/2019   CALCIUM 9.7 01/23/2019   PROT 7.7 01/23/2019   ALBUMIN 4.5 01/23/2019   AST 23 01/23/2019   ALT 29 01/23/2019   ALKPHOS 52 01/23/2019   BILITOT 0.6 01/23/2019   GFRNONAA 40 (L) 01/23/2019   GFRAA 46 (L) 01/23/2019    Lab Results  Component Value Date   WBC 8.5 09/05/2018   NEUTROABS 5.7 09/05/2018   HGB 11.6 (L) 09/05/2018   HCT 36.1 09/05/2018   MCV 87.4 09/05/2018   PLT 279 09/05/2018     STUDIES: CT ABDOMEN PELVIS WO CONTRAST  Result Date: 01/24/2019 CLINICAL DATA:  Status post left nephrectomy for renal cell carcinoma. EXAM: CT ABDOMEN AND PELVIS WITHOUT CONTRAST TECHNIQUE: Multidetector CT imaging of the abdomen and  pelvis was performed following the standard protocol without IV contrast. COMPARISON:  07/26/2018 and 12/22/2017. FINDINGS: Lower chest: A small pericardial effusion identified. Unchanged. Hepatobiliary: No focal liver abnormality is seen. No gallstones, gallbladder wall thickening, or biliary dilatation. Pancreas: Unremarkable. No pancreatic ductal dilatation or surrounding inflammatory changes. Spleen: Normal in size without focal abnormality. Adrenals/Urinary Tract: Normal appearance of the right adrenal gland. Status post left adrenalectomy and nephrectomy. No suspicious pulmonary nodule or mass identified. Normal appearance of the right kidney. No mass or hydronephrosis. Urinary bladder appears normal. Stomach/Bowel: Stomach is normal. No bowel wall thickening, inflammation or distension. The appendix is visualized and appears normal. Vascular/Lymphatic: Mild aortic atherosclerosis. No aneurysm. No abdominopelvic adenopathy. Reproductive: Status post hysterectomy. No adnexal masses. Other: No free fluid or fluid collections. Musculoskeletal: Degenerative disc disease identified at L5-S1. IMPRESSION: 1. Status post left adrenalectomy and nephrectomy. No specific findings identified to suggest residual or recurrence of tumor. 2. Stable small pericardial effusion. Electronically Signed   By: Kerby Moors M.D.   On: 01/24/2019 13:34   DG Chest 2 View  Result Date: 01/11/2019 CLINICAL DATA:  Chest pain EXAM: CHEST - 2 VIEW COMPARISON:  09/07/2014, CT 06/23/2017 FINDINGS: Mild linear scarring left base. No acute opacity or pleural effusion. Normal heart size. No pneumothorax. IMPRESSION: No active cardiopulmonary disease. Electronically Signed   By: Donavan Foil M.D.   On: 01/11/2019 19:47    ASSESSMENT: Stage III clear cell renal cell carcinoma of the left kidney  PLAN:    1. Stage III clear cell renal cell carcinoma of the left kidney: Patient's nephrectomy was on February 24, 2017.  Patient could not  tolerate 50 mg or a dose reduction of 37.5 mg of sorafenib secondary to worsening diarrhea, vomiting, and abnormal thyroid panel.  Because of this, treatment was discontinued altogether.  CT scan results from January 24, 2019 reviewed independently and reported as above with no obvious evidence of recurrent or progressive disease.  No intervention is needed at this time.  Continue CT scans every 6 months for a total of 3 years after patient's nephrectomy and then transition to yearly imaging.  Return to clinic in 6 months with repeat imaging and further evaluation.   2.  Renal insufficiency: Chronic and unchanged.  Patient creatinine is 1.40.  Continue follow-up with  nephrology as scheduled. 3.  Anemia: Chronic and unchanged.  Patient's most recent hemoglobin was 11.6.  I provided 20 minutes of face-to-face video visit time during this encounter which included chart review, counseling, and coordination of care as documented above.   Patient expressed understanding and was in agreement with this plan. She also understands that She can call clinic at any time with any questions, concerns, or complaints.   Cancer Staging Cancer of left kidney [facility]) Staging form: Kidney, AJCC 8th Edition - Clinical stage from 03/12/2017: Stage III (cT3a, cN0, cM0) - Signed by Lloyd Huger, MD on 03/12/2017   Lloyd Huger, MD   01/29/2019 6:24 AM

## 2019-01-28 ENCOUNTER — Encounter: Payer: Self-pay | Admitting: Oncology

## 2019-01-28 ENCOUNTER — Other Ambulatory Visit: Payer: Self-pay

## 2019-01-28 ENCOUNTER — Inpatient Hospital Stay (HOSPITAL_BASED_OUTPATIENT_CLINIC_OR_DEPARTMENT_OTHER): Payer: Commercial Managed Care - PPO | Admitting: Oncology

## 2019-01-28 DIAGNOSIS — E782 Mixed hyperlipidemia: Secondary | ICD-10-CM | POA: Insufficient documentation

## 2019-01-28 DIAGNOSIS — C642 Malignant neoplasm of left kidney, except renal pelvis: Secondary | ICD-10-CM | POA: Diagnosis not present

## 2019-02-13 IMAGING — CT CT ABD-PELV W/O CM
2 of 4 series · 16 of 46 positions shown, 18 images · non-contrast
Comparison: CT 09/22/2017 and 06/23/2017

CLINICAL DATA: Renal cell carcinoma status post left nephrectomy in
February 2017.

EXAM:
CT ABDOMEN AND PELVIS WITHOUT CONTRAST
TECHNIQUE: Multidetector CT imaging of the abdomen and pelvis was performed
following the standard protocol without IV contrast.

[Series 2: routine abdomen pelvis without · axial · non-contrast · 0.71mm/px · z∈[-1585,-1170]mm · 13 of 91 slices shown, 15 images (1 of 2)]
[im 4/91  soft-tissue]
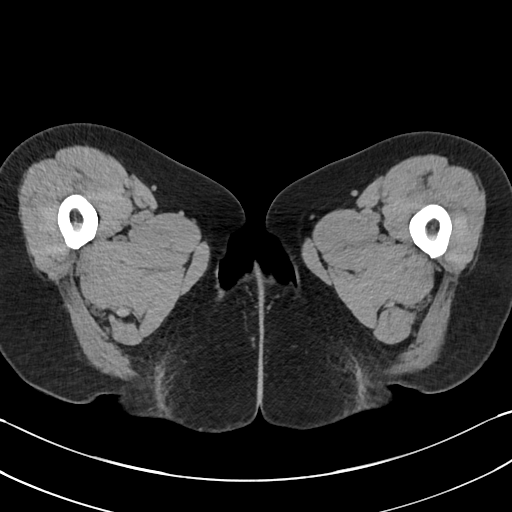
[im 4/91  bone]
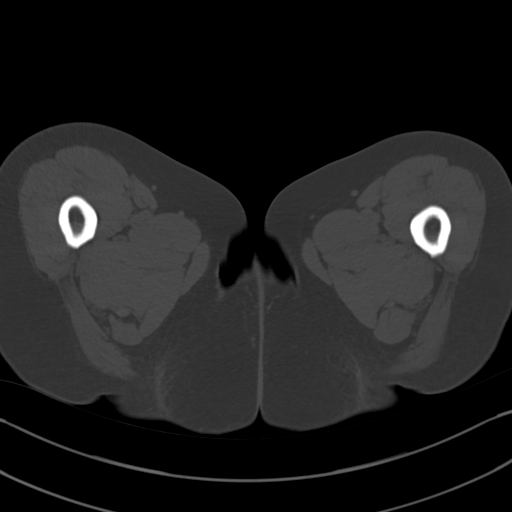
[im 11/91  soft-tissue]
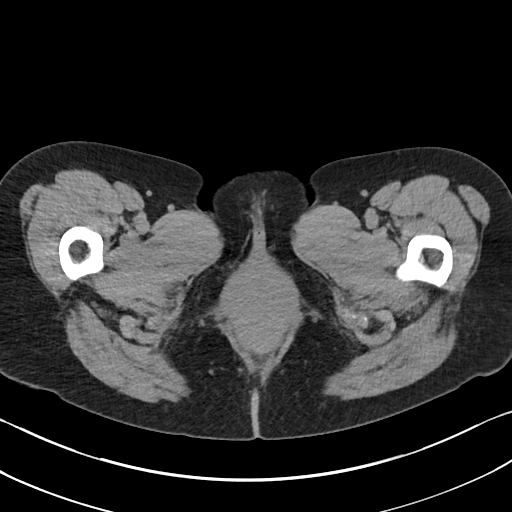
[im 18/91  soft-tissue]
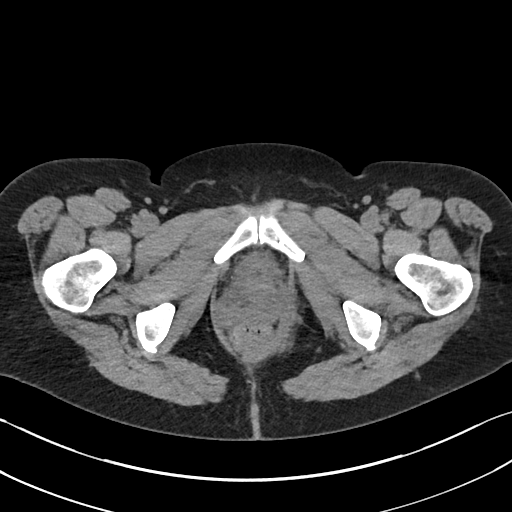
[im 25/91  soft-tissue]
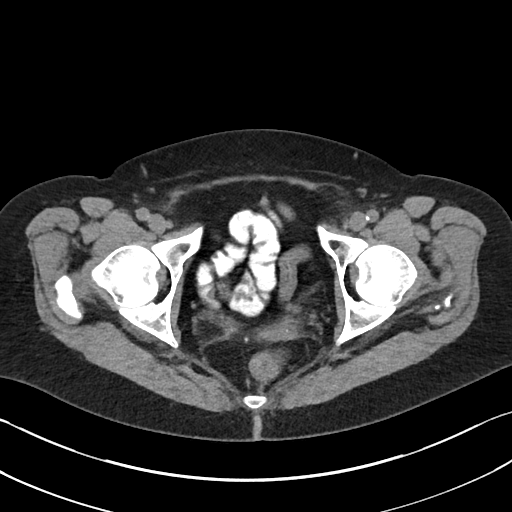
[im 32/91  soft-tissue]
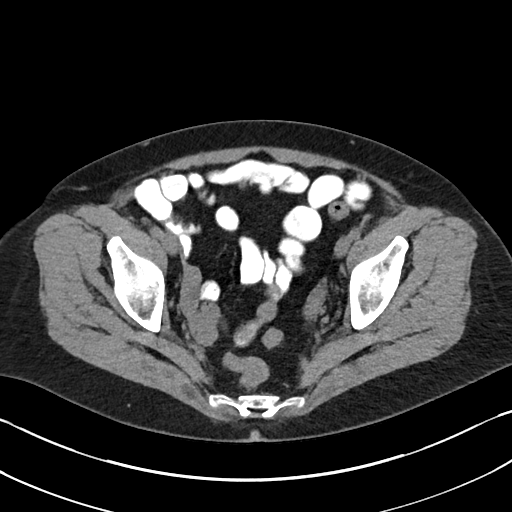
[im 39/91  soft-tissue]
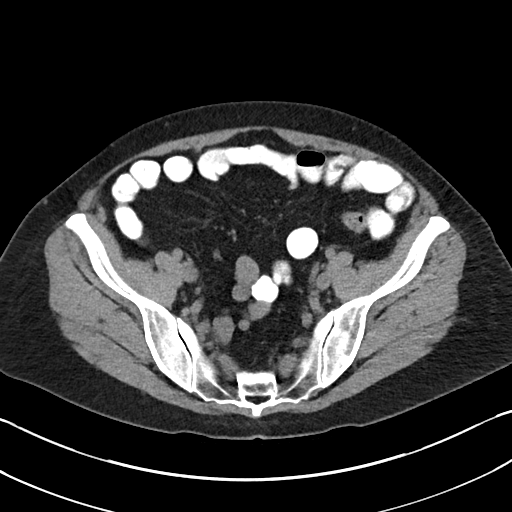
[im 46/91  soft-tissue]
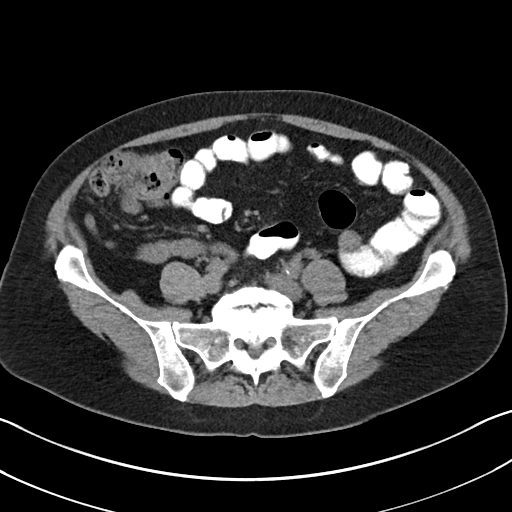
[im 52/91  soft-tissue]
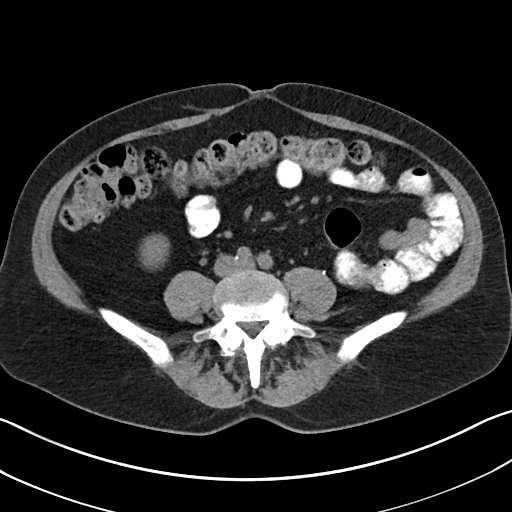
[im 59/91  soft-tissue]
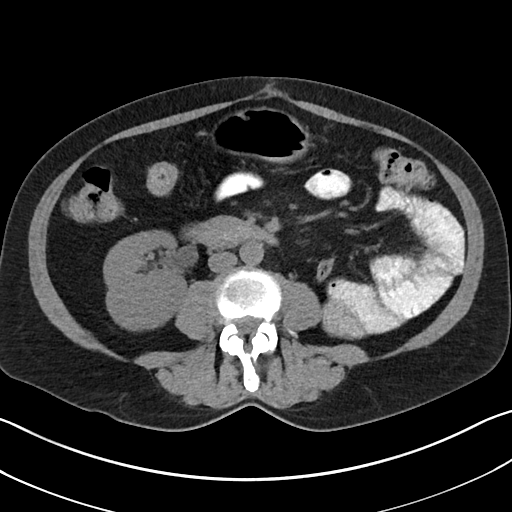
[im 59/91  bone]
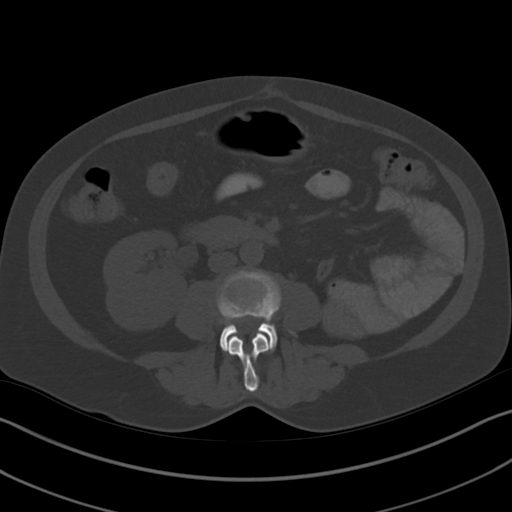
[im 66/91  soft-tissue]
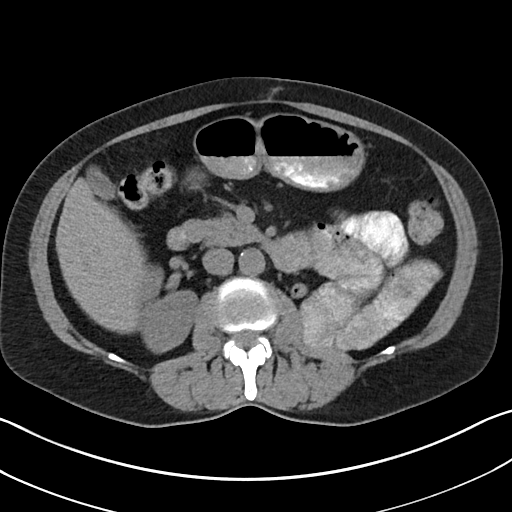
[im 73/91  soft-tissue]
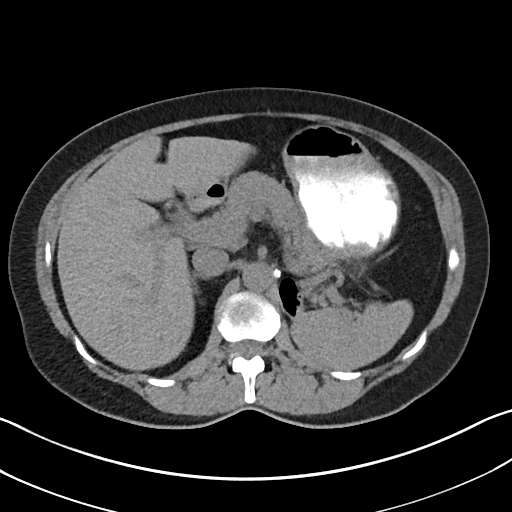
[im 80/91  soft-tissue]
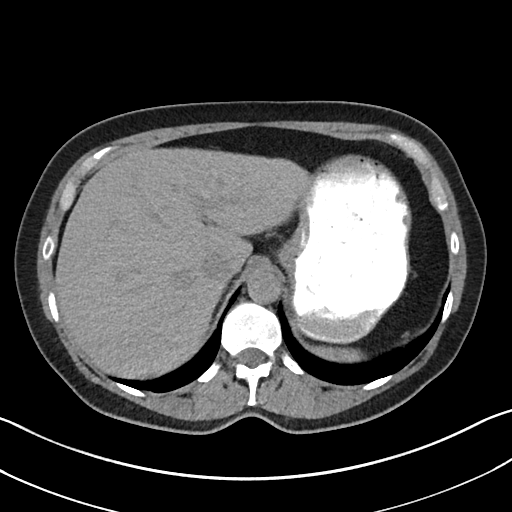
[im 87/91  soft-tissue]
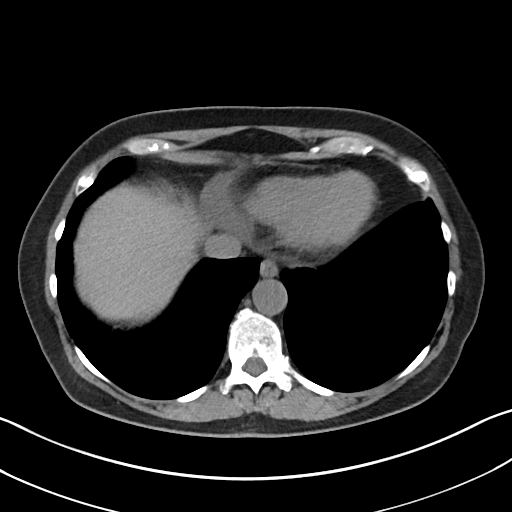

[Series 4: routine abdomen pelvis without · coronal · non-contrast · 0.71mm/px · 3 of 129 slices shown (2 of 2)]
[im 43/129  soft-tissue]
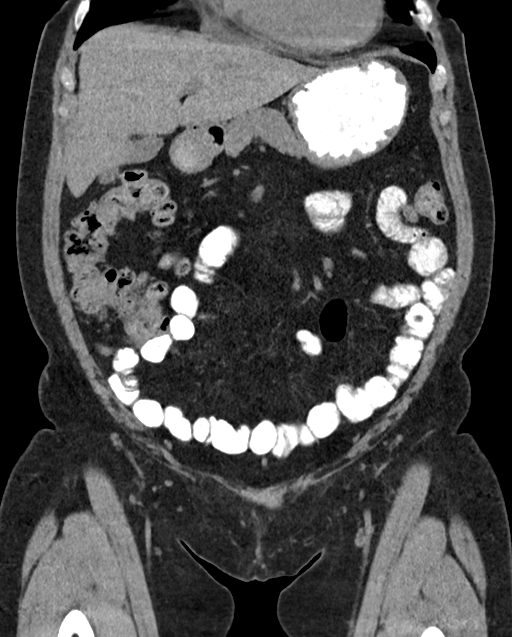
[im 57/129  soft-tissue]
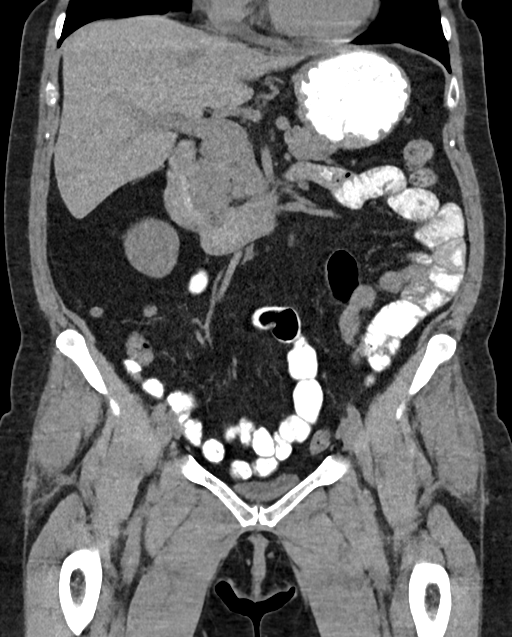
[im 72/129  soft-tissue]
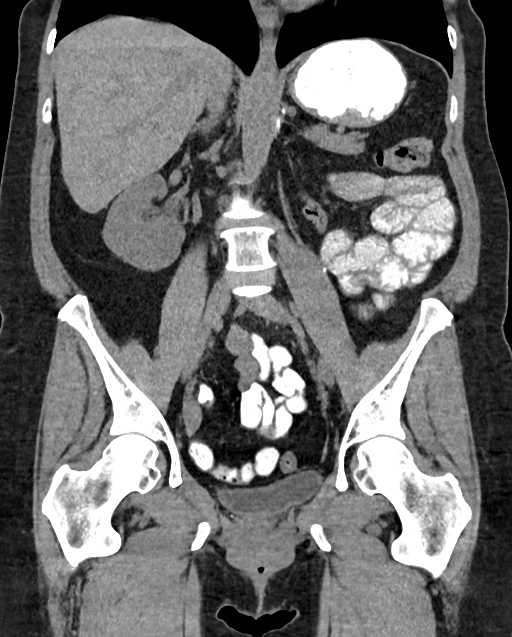

[16 of 46 positions shown; findings below may reference images not displayed]

FINDINGS: Lower chest: Probable emphysematous changes at both lung bases with
mild linear scarring. No suspicious nodularity or pleural effusion.
There is a stable small pericardial effusion.

Hepatobiliary: The liver has a stable appearance as evaluated in the
noncontrast state. No evidence of gallstones, gallbladder wall
thickening or biliary dilatation.

Pancreas: Unremarkable. No pancreatic ductal dilatation or
surrounding inflammatory changes.

Spleen: Normal in size without focal abnormality. The spleen is
rotated posteromedially into the nephrectomy bed

Adrenals/Urinary Tract: Stable normal appearance of the right
adrenal gland and right kidney. There is no evidence of mass within
the left nephrectomy bed. The left adrenal gland is not clearly
visualized and may be surgically absent. The urinary bladder appears
unremarkable.

Stomach/Bowel: No evidence of bowel wall thickening, distention or
surrounding inflammatory change. Small bowel protrudes lateral to
the descending colon but appears stable. The appendix appears
normal.

Vascular/Lymphatic: There are no enlarged abdominal or pelvic lymph
nodes. Mild aortic and branch vessel atherosclerosis.

Reproductive: Hysterectomy.  Stable appearance of the adnexa.

Other: Stable postsurgical changes in the anterior abdominal wall
without significant hernia. No ascites or peritoneal nodularity.

Musculoskeletal: No acute or significant osseous findings.
Postsurgical changes in the pubic bones and lower lumbar spondylosis
noted.
IMPRESSION: 1. Stable examination. No evidence of local recurrence or metastatic
disease status post left nephrectomy and presumed adrenalectomy.
2. The right kidney has a stable appearance.

## 2019-02-15 ENCOUNTER — Ambulatory Visit: Payer: Commercial Managed Care - PPO

## 2019-02-18 ENCOUNTER — Other Ambulatory Visit: Payer: Self-pay | Admitting: Pulmonary Disease

## 2019-04-03 NOTE — Telephone Encounter (Signed)
Per last ov with Dr. Alva Garnet 9/20:   PLAN:  Blood tests ordered today: BMET (to monitor kidney function) and CBC with differential (to look at eosinophil count)  Continue Advair discus 500/50, 1 inhalation twice daily.  Rinse mouth after use Continue montelukast 10 mg daily Continue Xopenex inhaler as needed for increased shortness of breath, wheezing, chest tightness, cough  Follow-up in 3-4 months with Dr. Patsey Berthold.  At that time, further decisions regarding Nucala (resumption or permanent discontinuation) will be discussed.  At some point within the next 6 months she should have repeat PFTs performed.   25 mins of this encounter involved counseling and discussion of the above plan

## 2019-04-05 NOTE — Telephone Encounter (Signed)
I have not seen her and she is past due for visit. May have 1 more refill but needs appt.

## 2019-04-15 ENCOUNTER — Ambulatory Visit: Payer: Commercial Managed Care - PPO | Admitting: Urology

## 2019-04-16 ENCOUNTER — Telehealth: Payer: Self-pay | Admitting: Pulmonary Disease

## 2019-04-16 NOTE — Telephone Encounter (Signed)
Pt is currently receiving Nucala 100mg q4w at our office. We are starting to transition our patients to self administer at home. Please advise if you believe pt would be a good candidate for this. Thanks.  

## 2019-04-17 NOTE — Telephone Encounter (Signed)
Please disregard message

## 2019-04-19 NOTE — Telephone Encounter (Signed)
Called patient and made aware that she would need ov for future refills. Dr.G has agreed to do 1 refill.

## 2019-05-16 ENCOUNTER — Ambulatory Visit: Payer: Commercial Managed Care - PPO | Admitting: Pulmonary Disease

## 2019-05-16 ENCOUNTER — Other Ambulatory Visit: Payer: Self-pay

## 2019-05-16 ENCOUNTER — Telehealth: Payer: Self-pay | Admitting: Pulmonary Disease

## 2019-05-16 ENCOUNTER — Encounter: Payer: Self-pay | Admitting: Pulmonary Disease

## 2019-05-16 VITALS — BP 134/64 | HR 87 | Temp 98.0°F | Ht 66.0 in | Wt 162.4 lb

## 2019-05-16 DIAGNOSIS — J4550 Severe persistent asthma, uncomplicated: Secondary | ICD-10-CM

## 2019-05-16 DIAGNOSIS — K200 Eosinophilic esophagitis: Secondary | ICD-10-CM

## 2019-05-16 DIAGNOSIS — D72110 Idiopathic hypereosinophilic syndrome (ihes): Secondary | ICD-10-CM

## 2019-05-16 DIAGNOSIS — N1831 Chronic kidney disease, stage 3a: Secondary | ICD-10-CM

## 2019-05-16 NOTE — Telephone Encounter (Signed)
Pt seen in clinic today by Dr Patsey Berthold. Per Dr. Patsey Berthold, needs to start back on her Nucala- same dose 100 mg every 28 days. She has been on med before and just held for 3 months. She is just needing continuation order to be sent. SPt states would like her next injection to be given in office before she starts giving on her own. Will route to injection pool. Thank you!!

## 2019-05-16 NOTE — Patient Instructions (Signed)
We will start your Nucala back.  We will see him in follow-up in 2 months time call sooner should any new difficulties arise

## 2019-05-23 ENCOUNTER — Ambulatory Visit: Payer: Commercial Managed Care - PPO | Admitting: Dermatology

## 2019-05-23 NOTE — Telephone Encounter (Signed)
LMTCB x1 for pt to set up appointment.

## 2019-05-23 NOTE — Telephone Encounter (Signed)
Spoke with pt. Her appointment has been scheduled for 06/11/2019 at 1400.

## 2019-05-23 NOTE — Telephone Encounter (Signed)
Nucala Order: 100mg  #1 prefilled syringe Order Date: 05/23/2019 Expected date of arrival: 05/30/2019 Ordered by: Desmond Dike, [place]  Specialty Pharmacy: [facility]

## 2019-05-24 ENCOUNTER — Telehealth: Payer: Self-pay | Admitting: Pulmonary Disease

## 2019-05-24 MED ORDER — ZAFIRLUKAST 20 MG PO TABS: 20.0000 mg | ORAL_TABLET | Freq: Two times a day (BID) | ORAL | 2 refills | Status: DC

## 2019-05-24 NOTE — Telephone Encounter (Signed)
Lets call in Accolate 20 mg p.o. twice daily

## 2019-05-24 NOTE — Telephone Encounter (Signed)
Pt calling back regarding earlier message.  Please advise.  [phone]

## 2019-05-24 NOTE — Telephone Encounter (Signed)
Dr. Patsey Berthold please advise   Pt states DG took her off of Singulair and was told to let DG know if she experiences more asthma. pt states that she has a cough and SOB but no wheezing. Wanted to know if DG needs to call in something else. [facility]

## 2019-05-24 NOTE — Telephone Encounter (Signed)
Called and spoke with patient RX has been sent to pharmacy. Nothing further needed at this time.

## 2019-05-30 NOTE — Telephone Encounter (Signed)
Nucala Shipment Received: 100mg  #1 vial Medication arrival date: 05/30/2019 Lot #: [id number] Exp date: 10/2020 Received by: Desmond Dike, [place]

## 2019-05-31 ENCOUNTER — Other Ambulatory Visit: Payer: Self-pay | Admitting: Pulmonary Disease

## 2019-06-04 ENCOUNTER — Institutional Professional Consult (permissible substitution): Payer: Commercial Managed Care - PPO | Admitting: Pulmonary Disease

## 2019-06-11 ENCOUNTER — Other Ambulatory Visit: Payer: Self-pay | Admitting: Gastroenterology

## 2019-06-11 ENCOUNTER — Ambulatory Visit (INDEPENDENT_AMBULATORY_CARE_PROVIDER_SITE_OTHER): Payer: Commercial Managed Care - PPO

## 2019-06-11 ENCOUNTER — Other Ambulatory Visit: Payer: Self-pay

## 2019-06-11 DIAGNOSIS — R1011 Right upper quadrant pain: Secondary | ICD-10-CM

## 2019-06-11 DIAGNOSIS — J4550 Severe persistent asthma, uncomplicated: Secondary | ICD-10-CM

## 2019-06-11 MED ORDER — MEPOLIZUMAB 100 MG/ML ~~LOC~~ SOSY
100.0000 mg | PREFILLED_SYRINGE | Freq: Once | SUBCUTANEOUS | Status: AC
  Administered 2019-06-11: 100 mg via SUBCUTANEOUS

## 2019-06-11 NOTE — Progress Notes (Signed)
Have you been hospitalized within the last 10 days?  No Do you have a fever?  No Do you have a cough?  No Do you have a headache or sore throat? No Do you have your Epi Pen visible and is it within date?  Yes   Patient monitored for restart of Nucala. Patient was previously taking Nucala home injections. Patient denies any signs of any side effects. Patient epi pen at her side. Patient instructed on self injections with auto injector pen.  Understanding stated.  Advised Patient to call for any questions or concerns.  Nothing further at this time.

## 2019-06-14 ENCOUNTER — Ambulatory Visit
Admission: RE | Admit: 2019-06-14 | Discharge: 2019-06-14 | Disposition: A | Discharge status: Home / Self Care | Payer: Commercial Managed Care - PPO | Source: Ambulatory Visit | Attending: Gastroenterology | Admitting: Gastroenterology

## 2019-06-14 ENCOUNTER — Other Ambulatory Visit: Payer: Self-pay

## 2019-06-14 DIAGNOSIS — R1011 Right upper quadrant pain: Secondary | ICD-10-CM | POA: Insufficient documentation

## 2019-06-20 ENCOUNTER — Ambulatory Visit: Payer: Commercial Managed Care - PPO

## 2019-07-03 ENCOUNTER — Other Ambulatory Visit: Payer: Self-pay | Admitting: Pulmonary Disease

## 2019-07-03 ENCOUNTER — Telehealth: Payer: Self-pay | Admitting: Pharmacy Technician

## 2019-07-03 NOTE — Telephone Encounter (Signed)
Received Geradine Girt PA fax request from [place]. Completed and faxed to [facility] office for MD signature. Will follow up.

## 2019-07-04 NOTE — Telephone Encounter (Signed)
PA has been completed and faxed by to pharmacy team.

## 2019-07-04 NOTE — Telephone Encounter (Signed)
Received PA form, faxed to [facility], will update when we receive a response.

## 2019-07-16 NOTE — Telephone Encounter (Signed)
Received notification from [facility] regarding a prior authorization for [place]. Authorization has been APPROVED from 07/09/19 to 07/08/20.   Authorization # [id number]

## 2019-07-18 ENCOUNTER — Other Ambulatory Visit: Payer: Self-pay | Admitting: Pulmonary Disease

## 2019-07-19 ENCOUNTER — Other Ambulatory Visit: Payer: Self-pay

## 2019-07-19 MED ORDER — ZAFIRLUKAST 20 MG PO TABS: 20.0000 mg | ORAL_TABLET | Freq: Two times a day (BID) | ORAL | 2 refills | Status: DC

## 2019-07-19 NOTE — Telephone Encounter (Signed)
[facility] Zafirlkast [facility]mg  [facility] as requested by [facility].  Nothing further is needed.

## 2019-07-22 ENCOUNTER — Other Ambulatory Visit: Payer: Commercial Managed Care - PPO

## 2019-07-23 ENCOUNTER — Ambulatory Visit: Payer: Commercial Managed Care - PPO

## 2019-07-29 ENCOUNTER — Ambulatory Visit: Payer: Commercial Managed Care - PPO | Admitting: Oncology

## 2019-07-30 ENCOUNTER — Ambulatory Visit (INDEPENDENT_AMBULATORY_CARE_PROVIDER_SITE_OTHER): Payer: Commercial Managed Care - PPO | Admitting: Pulmonary Disease

## 2019-07-30 ENCOUNTER — Encounter: Payer: Self-pay | Admitting: Pulmonary Disease

## 2019-07-30 ENCOUNTER — Other Ambulatory Visit: Payer: Self-pay

## 2019-07-30 VITALS — BP 130/76 | HR 63 | Temp 98.3°F | Ht 63.78 in | Wt 161.8 lb

## 2019-07-30 DIAGNOSIS — N1831 Chronic kidney disease, stage 3a: Secondary | ICD-10-CM

## 2019-07-30 DIAGNOSIS — J4550 Severe persistent asthma, uncomplicated: Secondary | ICD-10-CM

## 2019-07-30 DIAGNOSIS — D72110 Idiopathic hypereosinophilic syndrome (ihes): Secondary | ICD-10-CM | POA: Diagnosis not present

## 2019-07-30 MED ORDER — TRELEGY ELLIPTA 100-62.5-25 MCG/INH IN AEPB: 1.0000 | INHALATION_SPRAY | Freq: Every day | RESPIRATORY_TRACT | 0 refills | Status: DC

## 2019-07-30 MED ORDER — NUCALA 100 MG/ML ~~LOC~~ SOAJ: SUBCUTANEOUS | 11 refills | Status: DC

## 2019-07-30 MED ORDER — TRELEGY ELLIPTA 200-62.5-25 MCG/INH IN AEPB: 1.0000 | INHALATION_SPRAY | Freq: Every day | RESPIRATORY_TRACT | 0 refills | Status: DC

## 2019-07-30 NOTE — Patient Instructions (Signed)
Your prescription for Heidi Torres is up-to-date.  We are giving you a trial of Trelegy Ellipta 1 inhalation daily, make sure you rinse your mouth well after you use it.  Let us know how you are doing with the Trelegy.  Do not use Advair with the Trelegy.  We will see you in follow-up in 2 to 3 months time call sooner should any new problems arise.

## 2019-07-30 NOTE — Progress Notes (Signed)
Subjective:    Patient ID: Heidi Torres, female    DOB: [birth year], 64 y.o.   MRN: [id number] Requesting MD/Service: Self Date of initial consultation: 09/18/17 by Dr. Merton Border. Reason for consultation: Moderate persistent asthma   PT PROFILE: 64 y.o. female never smoker with history of asthma since childhood self-referred for persistent dyspnea and chest tightness.    DATA: 12/22/16 PFTs [name]): FVC: 2.87 L (93 %pred), FEV1: 2.11 L (85 %pred), FEV1/FVC: 73%, TLC: 98 %pred, DLCO 97 %pred  11/29/17 CT chest: Central tree-in-bud opacity with scattered nodularity and patchy ground-glass attenuation identified in the right upper, middle, and lower lobes 06/23/17 CT chest: No significant findings.  Previously seen inflammatory changes resolved 11/07/17 PFTs: FVC: 2.29 > 2.82 L (68 > 84 %pred), FEV1: 1.62 > 2.12 L (62 > 81 %pred), FEV1/FVC: 71%, TLC: 6.06 L (113 %pred), DLCO 83 %pred     Abs eos count: 06/01/17: 1600 06/19/17: 1200 06/27/17:   900 08/07/17: 1200 10/03/17:   900   INTERVAL: Last visit 09/05/2018 with her today but [place].  Nucala initiated 03/2018.  I am assuming care of the patient since Dr. Alva Garnet left the practice.   HPI This is a scheduled follow-up as the patient needs her Nucala renewed.  She has been doing well her asthma control since she has been on Nucala.  She has been on Advair 500/50, 1 inhalation twice a day.  She is also on DuoNeb.  Takes Singulair as well.  Would like to simplify medications.  Previously she thought Callow was affecting her kidney function however this was not the case.  She does have stage III kidney disease.  This is being followed by nephrology.  He does not endorse any fevers, chills or sweats.  No cough or sputum production.  Feels Nucala has really helped her.  She has not had to use her rescue inhaler very much at all.  No nocturnal awakenings.  No chest pain.  No lower extremity edema or calf tenderness.  Overall she  feels well and looks well.   Review of Systems A 10 point review of systems was performed and it is as noted above otherwise negative.  Allergies  Allergen Reactions   Benzoin     Other reaction(s): Other (See Comments) Blisters   Ciprofloxacin Other (See Comments)    Arms were numb   Steri-Strip Compound Benzoin [Benzoin Compound] Other (See Comments)    Blisters   Current Meds  Medication Sig   ADVAIR DISKUS 500-50 MCG/DOSE AEPB TAKE 1 PUFF BY MOUTH TWICE A DAY   albuterol (VENTOLIN HFA) 108 (90 Base) MCG/ACT inhaler Inhale 2 puffs into the lungs every 4 (four) hours as needed.    ALPRAZolam (XANAX) 0.25 MG tablet Take 0.25 mg by mouth at bedtime as needed for anxiety. Anxiety.   aspirin EC 81 MG tablet Take 81 mg by mouth daily.   atorvastatin (LIPITOR) 40 MG tablet TAKE 1 TABLET BY MOUTH EVERY DAY AT NIGHT   Cholecalciferol (VITAMIN D3) 1000 units CAPS Take 1 capsule by mouth daily.   fluticasone (FLONASE) 50 MCG/ACT nasal spray Place 2 sprays into both nostrils daily.   ipratropium-albuterol (DUONEB) 0.5-2.5 (3) MG/3ML SOLN Take 3 mLs by nebulization.   levalbuterol (XOPENEX HFA) 45 MCG/ACT inhaler    Mepolizumab (NUCALA) 100 MG/ML SOAJ INJECT 1 PEN UNDER THE SKIN EVERY 28 DAYS   montelukast (SINGULAIR) 10 MG tablet Take 10 mg by mouth daily.   olmesartan (BENICAR) 20 MG tablet  Take 20 mg by mouth daily.   pantoprazole (PROTONIX) 40 MG tablet Take 40 mg by mouth 2 (two) times daily.   potassium chloride (MICRO-K) 10 MEQ CR capsule Take 20 mEq by mouth daily.   ranitidine (ZANTAC) 75 MG tablet Take 75 mg by mouth 2 (two) times daily.   triamterene-hydrochlorothiazide (DYAZIDE) 37.5-25 MG per capsule Take 1 capsule by mouth daily.   trimethoprim (TRIMPEX) 100 MG tablet Take 1 tablet (100 mg total) by mouth daily.   [DISCONTINUED] NUCALA 100 MG/ML SOAJ INJECT 1 PEN UNDER THE SKIN EVERY 28 DAYS   Immunization History  Administered Date(s) Administered   Influenza Inj Mdck Quad  Pf 10/24/2016, 11/06/2017   Influenza,inj,Quad PF,6+ Mos 10/05/2018   Influenza-Unspecified 10/03/2013, 01/27/2016   PFIZER SARS-COV-2 Vaccination 02/28/2019, 03/21/2019   Pneumococcal Polysaccharide-23 02/22/2016   Tdap 02/22/2012, 12/19/2015   Zoster Recombinat (Shingrix) 08/26/2017, 11/08/2017, 11/09/2017, 12/21/2017       Objective:   Physical Exam BP (!) 130/76 (BP Location: Left Arm, Cuff Size: Normal)   Pulse 63   Temp 98.3 F (36.8 C) (Oral)   Ht 5' 3.78" (1.62 m)   Wt 161 lb 12.8 oz (73.4 kg)   LMP  (LMP Unknown)   SpO2 98%   BMI 27.96 kg/m   GENERAL: Awake, alert, fully ambulatory, no focal deficits. HEAD: Normocephalic, atraumatic.  EYES: Pupils equal, round, reactive to light.  No scleral icterus.  Mild periorbital puffiness. MOUTH: Nose/mouth/throat not examined due to masking requirements for COVID 19. NECK: Supple. No thyromegaly. Trachea midline. No JVD.  No adenopathy. PULMONARY: Lungs clear to auscultation bilaterally. CARDIOVASCULAR: S1 and S2. Regular rate and rhythm.  GASTROINTESTINAL: Benign. MUSCULOSKELETAL: No joint deformity, no clubbing, no edema.  NEUROLOGIC: No focal deficits, fluent speech, no gait disturbance noted. SKIN: Intact,warm,dry.  Limited exam, no rashes. PSYCH: Mood and behavior appropriate.       Assessment & Plan:     ICD-10-CM   1. Severe persistent asthma without complication  F02.63    Continue Nucala injections Change Advair 500 to Trelegy Ellipta 200 Continue Singulair for now    2. Idiopathic hypereosinophilic syndrome  Z85.885    Nucala has been helping Eosinophilia with some persistence in April Patient has upcoming blood draws by primary care in August Will review    3. Stage 3a chronic kidney disease  N18.31    This issue adds complexity to her management Follows with nephrology     Meds ordered this encounter  Medications   Mepolizumab (NUCALA) 100 MG/ML SOAJ    Sig: INJECT 1 PEN UNDER THE SKIN EVERY 28  DAYS    Dispense:  1 mL    Refill:  11   Fluticasone-Umeclidin-Vilant (TRELEGY ELLIPTA) 100-62.5-25 MCG/INH AEPB    Sig: Inhale 1 puff into the lungs daily.    Dispense:  14 each    Refill:  0   Fluticasone-Umeclidin-Vilant (TRELEGY ELLIPTA) 200-62.5-25 MCG/INH AEPB    Sig: Inhale 1 puff into the lungs daily.    Dispense:  14 each    Refill:  0    Order Specific Question:   Lot Number?    Answer:   [id number]    Order Specific Question:   Expiration Date?    Answer:   09/03/2020    Order Specific Question:   Quantity    Answer:   2   Patient was instructed on the use of Trelegy Ellipta.  She is to discontinue Advair.  This will decrease the inhaled corticosteroid dose and  hopefully keep her symptoms controlled.  She is to continue Nucala injections.  We will see her in follow-up in 2 to 3 months time she is to call sooner should any new problems arise.  Renold Don, MD Advanced Bronchoscopy PCCM  [facility]    *This note was dictated using voice recognition software/Dragon.  Despite best efforts to proofread, errors can occur which can change the meaning. Any transcriptional errors that result from this process are unintentional and may not be fully corrected at the time of dictation.

## 2019-08-07 ENCOUNTER — Ambulatory Visit: Payer: Commercial Managed Care - PPO | Admitting: Oncology

## 2019-08-09 ENCOUNTER — Other Ambulatory Visit: Payer: Self-pay | Admitting: *Deleted

## 2019-08-09 DIAGNOSIS — C642 Malignant neoplasm of left kidney, except renal pelvis: Secondary | ICD-10-CM

## 2019-08-12 ENCOUNTER — Inpatient Hospital Stay: Payer: Commercial Managed Care - PPO

## 2019-08-13 ENCOUNTER — Inpatient Hospital Stay: Payer: Commercial Managed Care - PPO | Attending: Oncology

## 2019-08-13 ENCOUNTER — Ambulatory Visit: Payer: Commercial Managed Care - PPO

## 2019-08-13 ENCOUNTER — Other Ambulatory Visit: Payer: Self-pay

## 2019-08-13 DIAGNOSIS — N289 Disorder of kidney and ureter, unspecified: Secondary | ICD-10-CM | POA: Insufficient documentation

## 2019-08-13 DIAGNOSIS — Z905 Acquired absence of kidney: Secondary | ICD-10-CM | POA: Insufficient documentation

## 2019-08-13 DIAGNOSIS — Z85828 Personal history of other malignant neoplasm of skin: Secondary | ICD-10-CM | POA: Insufficient documentation

## 2019-08-13 DIAGNOSIS — D649 Anemia, unspecified: Secondary | ICD-10-CM | POA: Diagnosis not present

## 2019-08-13 DIAGNOSIS — Z90710 Acquired absence of both cervix and uterus: Secondary | ICD-10-CM | POA: Insufficient documentation

## 2019-08-13 DIAGNOSIS — Z85528 Personal history of other malignant neoplasm of kidney: Secondary | ICD-10-CM | POA: Insufficient documentation

## 2019-08-13 DIAGNOSIS — C642 Malignant neoplasm of left kidney, except renal pelvis: Secondary | ICD-10-CM

## 2019-08-13 LAB — CBC WITH DIFFERENTIAL/PLATELET
Abs Immature Granulocytes: 0.02 10*3/uL (ref 0.00–0.07)
Basophils Absolute: 0 10*3/uL (ref 0.0–0.1)
Basophils Relative: 1 %
Eosinophils Absolute: 0.1 10*3/uL (ref 0.0–0.5)
Eosinophils Relative: 2 %
HCT: 34.3 % — ABNORMAL LOW (ref 36.0–46.0)
Hemoglobin: 11.4 g/dL — ABNORMAL LOW (ref 12.0–15.0)
Immature Granulocytes: 0 %
Lymphocytes Relative: 37 %
Lymphs Abs: 2.7 10*3/uL (ref 0.7–4.0)
MCH: 28.4 pg (ref 26.0–34.0)
MCHC: 33.2 g/dL (ref 30.0–36.0)
MCV: 85.5 fL (ref 80.0–100.0)
Monocytes Absolute: 0.4 10*3/uL (ref 0.1–1.0)
Monocytes Relative: 6 %
Neutro Abs: 3.9 10*3/uL (ref 1.7–7.7)
Neutrophils Relative %: 54 %
Platelets: 265 10*3/uL (ref 150–400)
RBC: 4.01 MIL/uL (ref 3.87–5.11)
RDW: 12.9 % (ref 11.5–15.5)
WBC: 7.3 10*3/uL (ref 4.0–10.5)
nRBC: 0 % (ref 0.0–0.2)

## 2019-08-13 LAB — COMPREHENSIVE METABOLIC PANEL
ALT: 25 U/L (ref 0–44)
AST: 22 U/L (ref 15–41)
Albumin: 4.8 g/dL (ref 3.5–5.0)
Alkaline Phosphatase: 53 U/L (ref 38–126)
Anion gap: 11 (ref 5–15)
BUN: 34 mg/dL — ABNORMAL HIGH (ref 8–23)
CO2: 23 mmol/L (ref 22–32)
Calcium: 9.6 mg/dL (ref 8.9–10.3)
Chloride: 106 mmol/L (ref 98–111)
Creatinine, Ser: 1.39 mg/dL — ABNORMAL HIGH (ref 0.44–1.00)
GFR calc Af Amer: 46 mL/min — ABNORMAL LOW (ref >60)
GFR calc non Af Amer: 40 mL/min — ABNORMAL LOW (ref >60)
Glucose, Bld: 126 mg/dL — ABNORMAL HIGH (ref 70–99)
Potassium: 3.6 mmol/L (ref 3.5–5.1)
Sodium: 140 mmol/L (ref 135–145)
Total Bilirubin: 0.8 mg/dL (ref 0.3–1.2)
Total Protein: 7.7 g/dL (ref 6.5–8.1)

## 2019-08-14 ENCOUNTER — Ambulatory Visit: Payer: Commercial Managed Care - PPO

## 2019-08-14 ENCOUNTER — Ambulatory Visit
Admission: RE | Admit: 2019-08-14 | Discharge: 2019-08-14 | Disposition: A | Discharge status: Home / Self Care | Payer: Commercial Managed Care - PPO | Source: Ambulatory Visit | Attending: Oncology | Admitting: Oncology

## 2019-08-14 ENCOUNTER — Other Ambulatory Visit: Payer: Self-pay

## 2019-08-14 DIAGNOSIS — C642 Malignant neoplasm of left kidney, except renal pelvis: Secondary | ICD-10-CM | POA: Diagnosis not present

## 2019-08-14 LAB — PTH, INTACT AND CALCIUM
Calcium, Total (PTH): 10 mg/dL (ref 8.7–10.3)
PTH: 32 pg/mL (ref 15–65)

## 2019-08-16 ENCOUNTER — Ambulatory Visit: Admission: RE | Admit: 2019-08-16 | Payer: Commercial Managed Care - PPO | Source: Ambulatory Visit

## 2019-08-16 NOTE — Progress Notes (Signed)
  [place]  Telephone:([phone] [phone]) [phone]  ID: Adraine Biffle Tino OB: 06-17-1955  MR#: [id number]  KHT#:[id number]  Patient Care Team: [name], MD as PCP - General (Family Medicine) Lloyd Huger, MD as Medical Oncologist (Oncology)   Cancer Staging Cancer of left kidney [facility]) Staging form: Kidney, AJCC 8th Edition - Clinical stage from 03/12/2017: Stage III (cT3a, cN0, cM0) - Signed by Lloyd Huger, MD on 03/12/2017   Lloyd Huger, MD   08/21/2019 11:18 AM     This encounter was created in error - please disregard.

## 2019-08-21 ENCOUNTER — Ambulatory Visit: Payer: Commercial Managed Care - PPO | Admitting: Oncology

## 2019-08-21 ENCOUNTER — Other Ambulatory Visit: Payer: Self-pay

## 2019-08-21 ENCOUNTER — Inpatient Hospital Stay: Payer: Commercial Managed Care - PPO | Admitting: Oncology

## 2019-08-21 NOTE — Progress Notes (Signed)
Patient states she would like to discuss recent labs done on thyroid and why testing results were different from lab results done by [place] provider. She would also like to discuss the need for COVID Booster shot.

## 2019-08-22 ENCOUNTER — Other Ambulatory Visit: Payer: Self-pay

## 2019-08-22 ENCOUNTER — Inpatient Hospital Stay (HOSPITAL_BASED_OUTPATIENT_CLINIC_OR_DEPARTMENT_OTHER): Payer: Commercial Managed Care - PPO | Admitting: Oncology

## 2019-08-22 VITALS — BP 134/73 | HR 67 | Temp 97.8°F | Resp 16 | Wt 161.4 lb

## 2019-08-22 DIAGNOSIS — Z85528 Personal history of other malignant neoplasm of kidney: Secondary | ICD-10-CM | POA: Diagnosis not present

## 2019-08-22 DIAGNOSIS — C642 Malignant neoplasm of left kidney, except renal pelvis: Secondary | ICD-10-CM

## 2019-08-22 NOTE — Progress Notes (Signed)
[place]  [phone] [phone]  ID: Heidi Torres OB: 01-Dec-1955  MR#: [id number]  YJE#:[id number]  Patient Care Team: [name], MD as PCP - General (Family Medicine) Lloyd Huger, MD as Medical Oncologist (Oncology)   CHIEF COMPLAINT: Stage III clear cell renal cell carcinoma of the left kidney  INTERVAL HISTORY: Patient returns to clinic today for further evaluation and discussion of her imaging results.  She continues to feel well and remains asymptomatic. She has no neurologic complaints.  She denies any recent fevers or illnesses.  She has good appetite and denies weight loss.  She denies any chest pain, shortness of breath, cough, or hemoptysis.  She has no nausea, vomiting, constipation, or diarrhea.  She has no urinary complaints.  Patient feels at her baseline offers no specific complaints today.  REVIEW OF SYSTEMS:   Review of Systems  Constitutional: Negative.  Negative for fever, malaise/fatigue and weight loss.  Respiratory: Negative.  Negative for cough and shortness of breath.   Cardiovascular: Negative.  Negative for chest pain and leg swelling.  Gastrointestinal: Negative.  Negative for abdominal pain, blood in stool, diarrhea and melena.  Genitourinary: Negative.  Negative for dysuria, flank pain and hematuria.  Musculoskeletal: Negative.  Negative for back pain.  Skin: Negative.  Negative for rash.  Neurological: Negative.  Negative for sensory change, focal weakness, weakness and headaches.  Psychiatric/Behavioral: Negative.  The patient is not nervous/anxious.     As per HPI. Otherwise, a complete review of systems is negative.  PAST MEDICAL HISTORY: Past Medical History:  Diagnosis Date  . Asthma   . Complication of anesthesia   . GERD (gastroesophageal reflux disease)   . Headache    occular HA  . Heart murmur   . History of hiatal hernia   . Hx of dysplastic nevus    multiple sites  . Hx  of squamous cell carcinoma of skin 06/22/2015   R infrascapular, SCC in situ  . Hypertension   . Irregular heart beat    pac's and pvc's  . Pneumonia    11-2016  . PONV (postoperative nausea and vomiting)   . Renal cell cancer, left ([place])     PAST SURGICAL HISTORY: Past Surgical History:  Procedure Laterality Date  . ABDOMINAL HYSTERECTOMY    . ABLATION     prior to hysterectomy  . CYSTOSCOPY/RETROGRADE/URETEROSCOPY     01-20-17 Dr. Junious Silk  . CYSTOSCOPY/RETROGRADE/URETEROSCOPY Left 01/20/2017   Procedure: CYSTOSCOPY/RETROGRADE/URETEROSCOPY/ BIOPSY,BLADDER BIOPSY PLACEMENT STENT LEFT URETER;  Surgeon: Festus Aloe, MD;  Location: [facility];  Service: Urology;  Laterality: Left;  . DILATION AND CURETTAGE OF UTERUS    . ESOPHAGOGASTRODUODENOSCOPY (EGD) WITH PROPOFOL N/A 01/09/2019   Procedure: ESOPHAGOGASTRODUODENOSCOPY (EGD) WITH PROPOFOL;  Surgeon: Toledo, Benay Pike, MD;  Location: [facility];  Service: Gastroenterology;  Laterality: N/A;  . NASAL ENDOSCOPY WITH EPISTAXIS CONTROL    . ROBOTIC ASSITED PARTIAL NEPHRECTOMY Left 02/24/2017   Procedure: XI ROBOTIC ASSITED LEFT  RADICAL NEPHRECTOMY;  Surgeon: Alexis Frock, MD;  Location: [facility];  Service: Urology;  Laterality: Left;  . TUBAL LIGATION      FAMILY HISTORY: Family History  Problem Relation Age of Onset  . Hypertension Brother     ADVANCED DIRECTIVES (Y/N):  N  HEALTH MAINTENANCE: Social History   Tobacco Use  . Smoking status: Never Smoker  . Smokeless tobacco: Never Used  Vaping Use  . Vaping Use: Never used  Substance Use Topics  . Alcohol use: No  .  Drug use: Never     Colonoscopy:  PAP:  Bone density:  Lipid panel:  Allergies  Allergen Reactions  . Benzoin     Other reaction(s): Other (See Comments) Blisters  . Ciprofloxacin Other (See Comments)    Arms were numb  . Steri-Strip Compound Benzoin [Benzoin Compound] Other (See Comments)    Blisters    Current Outpatient Medications    Medication Sig Dispense Refill  . ADVAIR DISKUS 500-50 MCG/DOSE AEPB TAKE 1 PUFF BY MOUTH TWICE A DAY 60 each 0  . albuterol (VENTOLIN HFA) 108 (90 Base) MCG/ACT inhaler Inhale 2 puffs into the lungs every 4 (four) hours as needed.     . ALPRAZolam (XANAX) 0.25 MG tablet Take 0.25 mg by mouth at bedtime as needed for anxiety. Anxiety.  5  . aspirin EC 81 MG tablet Take 81 mg by mouth daily.    Marland Kitchen atorvastatin (LIPITOR) 40 MG tablet TAKE 1 TABLET BY MOUTH EVERY DAY AT NIGHT    . Cholecalciferol (VITAMIN D3) 1000 units CAPS Take 1 capsule by mouth daily.    . fluticasone (FLONASE) 50 MCG/ACT nasal spray Place 2 sprays into both nostrils daily.    . Fluticasone-Umeclidin-Vilant (TRELEGY ELLIPTA) 200-62.5-25 MCG/INH AEPB Inhale 1 puff into the lungs daily. 14 each 0  . ipratropium-albuterol (DUONEB) 0.5-2.5 (3) MG/3ML SOLN Take 3 mLs by nebulization.    Marland Kitchen levalbuterol (XOPENEX HFA) 45 MCG/ACT inhaler     . levalbuterol (XOPENEX) 0.63 MG/3ML nebulizer solution Take 3 mLs (0.63 mg total) by nebulization every 4 (four) hours as needed for wheezing or shortness of breath. Use as needed if Xopenex inhaler provides insufficient relief of shortness of breath, wheezing, chest tightness 30 mL 5  . Mepolizumab (NUCALA) 100 MG/ML SOAJ INJECT 1 PEN UNDER THE SKIN EVERY 28 DAYS 1 mL 11  . montelukast (SINGULAIR) 10 MG tablet Take 10 mg by mouth daily.    Marland Kitchen olmesartan (BENICAR) 20 MG tablet Take 20 mg by mouth daily.    . pantoprazole (PROTONIX) 40 MG tablet Take 40 mg by mouth 2 (two) times daily.    . potassium chloride (MICRO-K) 10 MEQ CR capsule Take 20 mEq by mouth daily.  11  . ranitidine (ZANTAC) 75 MG tablet Take 75 mg by mouth 2 (two) times daily.    Marland Kitchen triamterene-hydrochlorothiazide (DYAZIDE) 37.5-25 MG per capsule Take 1 capsule by mouth daily.    Marland Kitchen trimethoprim (TRIMPEX) 100 MG tablet Take 1 tablet (100 mg total) by mouth daily. 30 tablet 11   No current facility-administered medications for this  visit.    OBJECTIVE: Vitals:   08/22/19 1046  BP: 134/73  Pulse: 67  Resp: 16  Temp: 97.8 F (36.6 C)  SpO2: 98%     Body mass index is 27.9 kg/m.    ECOG FS:0 - Asymptomatic  General: Well-developed, well-nourished, no acute distress. Eyes: Pink conjunctiva, anicteric sclera. HEENT: Normocephalic, moist mucous membranes. Lungs: No audible wheezing or coughing. Heart: Regular rate and rhythm. Abdomen: Soft, nontender, no obvious distention. Musculoskeletal: No edema, cyanosis, or clubbing. Neuro: Alert, answering all questions appropriately. Cranial nerves grossly intact. Skin: No rashes or petechiae noted. Psych: Normal affect.   LAB RESULTS:  Lab Results  Component Value Date   NA 140 08/13/2019   K 3.6 08/13/2019   CL 106 08/13/2019   CO2 23 08/13/2019   GLUCOSE 126 (H) 08/13/2019   BUN 34 (H) 08/13/2019   CREATININE 1.39 (H) 08/13/2019   CALCIUM 10.0 08/13/2019  PROT 7.7 08/13/2019   ALBUMIN 4.8 08/13/2019   AST 22 08/13/2019   ALT 25 08/13/2019   ALKPHOS 53 08/13/2019   BILITOT 0.8 08/13/2019   GFRNONAA 40 (L) 08/13/2019   GFRAA 46 (L) 08/13/2019    Lab Results  Component Value Date   WBC 7.3 08/13/2019   NEUTROABS 3.9 08/13/2019   HGB 11.4 (L) 08/13/2019   HCT 34.3 (L) 08/13/2019   MCV 85.5 08/13/2019   PLT 265 08/13/2019     STUDIES: CT Abdomen Pelvis Wo Contrast  Result Date: 08/14/2019 CLINICAL DATA:  Renal cell carcinoma.  Restaging. EXAM: CT ABDOMEN AND PELVIS WITHOUT CONTRAST TECHNIQUE: Multidetector CT imaging of the abdomen and pelvis was performed following the standard protocol without IV contrast. COMPARISON:  01/24/2019 FINDINGS: Lower chest: Stable trace pericardial effusion. Hepatobiliary: No focal abnormality in the liver on this study without intravenous contrast. There is no evidence for gallstones, gallbladder wall thickening, or pericholecystic fluid. No intrahepatic or extrahepatic biliary dilation. Pancreas: No focal mass  lesion. No dilatation of the main duct. No intraparenchymal cyst. No peripancreatic edema. Spleen: SIRT normal spleen Adrenals/Urinary Tract: Right adrenal gland unremarkable. Left adrenal gland is surgically absent. Stable unremarkable noncontrast appearance of the right kidney and ureter. Left kidney is surgically absent without new or unexpected soft tissue in the nephrectomy bed. The urinary bladder appears normal for the degree of distention. Stomach/Bowel: Stomach is unremarkable. No gastric wall thickening. No evidence of outlet obstruction. Duodenum is normally positioned as is the ligament of Treitz. No small bowel wall thickening. No small bowel dilatation. The terminal ileum is normal. The appendix is normal. Small appendicoliths is noted in the base of the appendix. No gross colonic mass. No colonic wall thickening. Vascular/Lymphatic: There is abdominal aortic atherosclerosis without aneurysm. There is no gastrohepatic or hepatoduodenal ligament lymphadenopathy. No retroperitoneal or mesenteric lymphadenopathy. No pelvic sidewall lymphadenopathy. Reproductive: The uterus is surgically absent. There is no adnexal mass. Other: No intraperitoneal free fluid. Musculoskeletal: No worrisome lytic or sclerotic osseous abnormality. IMPRESSION: 1. Stable exam. No new or progressive findings to suggest recurrent or metastatic disease in the abdomen/pelvis. 2. Status post left nephrectomy, adrenalectomy, and hysterectomy. 3. Aortic Atherosclerosis (ICD10-I70.0). Electronically Signed   By: Misty Stanley M.D.   On: 08/14/2019 16:17    ASSESSMENT: Stage III clear cell renal cell carcinoma of the left kidney  PLAN:    1. Stage III clear cell renal cell carcinoma of the left kidney: Patient's nephrectomy was on February 24, 2017.  Patient could not tolerate 50 mg or a dose reduction of 37.5 mg of sorafenib secondary to worsening diarrhea, vomiting, and abnormal thyroid panel.  Because of this, treatment was  discontinued altogether.  Her most recent imaging with CT scan on August 14, 2019 reviewed independently and report as above with no obvious evidence of recurrent or progressive disease.  No intervention is needed at this time. Continue CT scans every 6 months for a total of 3 years after patient's nephrectomy and then transition to yearly imaging.  Return to clinic in 6 months with repeat laboratory work and CT scan followed by video assisted telemedicine visit. 2.  Renal insufficiency: Chronic and unchanged.  Continue follow-up with nephrology as scheduled. 3.  Anemia: Chronic and unchanged.  Patient's most recent hemoglobin was 11.4.   Patient expressed understanding and was in agreement with this plan. She also understands that She can call clinic at any time with any questions, concerns, or complaints.   Cancer Staging Cancer  of left kidney [facility]) Staging form: Kidney, AJCC 8th Edition - Clinical stage from 03/12/2017: Stage III (cT3a, cN0, cM0) - Signed by Lloyd Huger, MD on 03/12/2017   Lloyd Huger, MD   08/22/2019 11:07 AM

## 2019-09-03 ENCOUNTER — Other Ambulatory Visit: Payer: Self-pay

## 2019-09-03 ENCOUNTER — Ambulatory Visit
Admission: RE | Admit: 2019-09-03 | Discharge: 2019-09-03 | Disposition: A | Discharge status: Home / Self Care | Payer: Commercial Managed Care - PPO | Source: Ambulatory Visit | Attending: Family Medicine | Admitting: Family Medicine

## 2019-09-03 ENCOUNTER — Other Ambulatory Visit: Payer: Self-pay | Admitting: Family Medicine

## 2019-09-03 DIAGNOSIS — S5002XA Contusion of left elbow, initial encounter: Secondary | ICD-10-CM

## 2019-09-04 ENCOUNTER — Other Ambulatory Visit: Payer: Self-pay | Admitting: Family Medicine

## 2019-09-04 DIAGNOSIS — S5002XA Contusion of left elbow, initial encounter: Secondary | ICD-10-CM

## 2019-10-13 ENCOUNTER — Other Ambulatory Visit: Payer: Self-pay | Admitting: Pulmonary Disease

## 2019-10-21 ENCOUNTER — Other Ambulatory Visit: Payer: Self-pay | Admitting: Pulmonary Disease

## 2019-10-30 ENCOUNTER — Encounter: Payer: Commercial Managed Care - PPO | Admitting: Dermatology

## 2019-11-07 ENCOUNTER — Other Ambulatory Visit: Payer: Self-pay

## 2019-11-07 ENCOUNTER — Ambulatory Visit (INDEPENDENT_AMBULATORY_CARE_PROVIDER_SITE_OTHER): Payer: Commercial Managed Care - PPO | Admitting: Dermatology

## 2019-11-07 DIAGNOSIS — I781 Nevus, non-neoplastic: Secondary | ICD-10-CM

## 2019-11-07 DIAGNOSIS — L570 Actinic keratosis: Secondary | ICD-10-CM

## 2019-11-07 DIAGNOSIS — Z1283 Encounter for screening for malignant neoplasm of skin: Secondary | ICD-10-CM | POA: Diagnosis not present

## 2019-11-07 DIAGNOSIS — D1800 Hemangioma unspecified site: Secondary | ICD-10-CM

## 2019-11-07 DIAGNOSIS — D229 Melanocytic nevi, unspecified: Secondary | ICD-10-CM

## 2019-11-07 DIAGNOSIS — L821 Other seborrheic keratosis: Secondary | ICD-10-CM | POA: Diagnosis not present

## 2019-11-07 DIAGNOSIS — L814 Other melanin hyperpigmentation: Secondary | ICD-10-CM | POA: Diagnosis not present

## 2019-11-07 DIAGNOSIS — L578 Other skin changes due to chronic exposure to nonionizing radiation: Secondary | ICD-10-CM

## 2019-11-07 NOTE — Progress Notes (Deleted)
Follow-Up Visit   Subjective  Heidi Torres is a 64 y.o. female who presents for the following: Annual Exam (History of dysplastic nevi and SCC- TBSE today). The patient presents for Total-Body Skin Exam (TBSE) for skin cancer screening and mole check.  The following portions of the chart were reviewed this encounter and updated as appropriate:  Tobacco  Allergies  Meds  Problems  Med Hx  Surg Hx  Fam Hx     Review of Systems:  No other skin or systemic complaints except as noted in HPI or Assessment and Plan.  Objective  Well appearing patient in no apparent distress; mood and affect are within normal limits.  A full examination was performed including scalp, head, eyes, ears, nose, lips, neck, chest, axillae, abdomen, back, buttocks, bilateral upper extremities, bilateral lower extremities, hands, feet, fingers, toes, fingernails, and toenails. All findings within normal limits unless otherwise noted below.  Objective  Face (5): Erythematous thin papules/macules with gritty scale.   Objective  trunk, extremities (8): Stuck-on, waxy, tan-brown papule or plaque --Discussed benign etiology and prognosis.   Objective  Left Nasal Sidewall: Dilated blood vessel   Assessment & Plan    Lentigines - Scattered tan macules - Discussed due to sun exposure - Benign, observe - Call for any changes  Seborrheic Keratoses - Stuck-on, waxy, tan-brown papules and plaques  - Discussed benign etiology and prognosis. - Observe - Call for any changes  Melanocytic Nevi - Tan-brown and/or pink-flesh-colored symmetric macules and papules - Benign appearing on exam today - Observation - Call clinic for new or changing moles - Recommend daily use of broad spectrum spf 30+ sunscreen to sun-exposed areas.   Hemangiomas - Red papules - Discussed benign nature - Observe - Call for any changes  Actinic Damage - Chronic, secondary to cumulative UV/sun exposure - diffuse scaly  erythematous macules with underlying dyspigmentation - Recommend daily broad spectrum sunscreen SPF 30+ to sun-exposed areas, reapply every 2 hours as needed.  - Call for new or changing lesions.  Skin cancer screening performed today.  History of Dysplastic Nevi - No evidence of recurrence today - Recommend regular full body skin exams - Recommend daily broad spectrum sunscreen SPF 30+ to sun-exposed areas, reapply every 2 hours as needed.  - Call if any new or changing lesions are noted between office visits  History of Squamous Cell Carcinoma of the Skin - No evidence of recurrence today - No lymphadenopathy - Recommend regular full body skin exams - Recommend daily broad spectrum sunscreen SPF 30+ to sun-exposed areas, reapply every 2 hours as needed.  - Call if any new or changing lesions are noted between office visits  AK (actinic keratosis) (5) Face  Destruction of lesion - Face Complexity: simple   Destruction method: cryotherapy   Informed consent: discussed and consent obtained   Timeout:  patient name, date of birth, surgical site, and procedure verified Lesion destroyed using liquid nitrogen: Yes   Region frozen until ice ball extended beyond lesion: Yes   Outcome: patient tolerated procedure well with no complications   Post-procedure details: wound care instructions given    Seborrheic keratosis Head - Anterior (Face)  Discussed Ln2. Advised patient fee is $60 for first lesion and $15 each additional.  Destruction of lesion - Head - Anterior (Face) Complexity: simple   Destruction method: cryotherapy   Informed consent: discussed and consent obtained   Timeout:  patient name, date of birth, surgical site, and procedure verified Lesion destroyed  using liquid nitrogen: Yes   Region frozen until ice ball extended beyond lesion: Yes   Outcome: patient tolerated procedure well with no complications   Post-procedure details: wound care instructions given     Telangiectasia Left Nasal Sidewall  Benign. Discussed BBL/ laser. Advised patient fee is $200 for one spot.  Return in about 1 year (around 11/06/2020).   I, Ashok Cordia, CMA, am acting as scribe for Sarina Ser, MD .  Documentation: I have reviewed the above documentation for accuracy and completeness, and I agree with the above.  Sarina Ser, MD

## 2019-11-07 NOTE — Patient Instructions (Signed)

## 2019-11-08 ENCOUNTER — Other Ambulatory Visit: Payer: Self-pay

## 2019-11-08 NOTE — Telephone Encounter (Signed)
Received refill request from [facility] for zafirlukast 20 mg. Denied refill request, Dr. Patsey Berthold d/c this medication at the patient's last office visit.

## 2019-11-11 ENCOUNTER — Encounter: Payer: Self-pay | Admitting: Dermatology

## 2019-11-11 NOTE — Progress Notes (Signed)
   Follow-Up Visit   Subjective  Heidi Torres is a 64 y.o. female who presents for the following: Annual Exam (History of dysplastic nevi and SCC- TBSE today). The patient presents for Total-Body Skin Exam (TBSE) for skin cancer screening and mole check.  The following portions of the chart were reviewed this encounter and updated as appropriate:  Tobacco  Allergies  Meds  Problems  Med Hx  Surg Hx  Fam Hx     Review of Systems:  No other skin or systemic complaints except as noted in HPI or Assessment and Plan.  Objective  Well appearing patient in no apparent distress; mood and affect are within normal limits.  A full examination was performed including scalp, head, eyes, ears, nose, lips, neck, chest, axillae, abdomen, back, buttocks, bilateral upper extremities, bilateral lower extremities, hands, feet, fingers, toes, fingernails, and toenails. All findings within normal limits unless otherwise noted below.  Objective  Face (5): Erythematous thin papules/macules with gritty scale.   Objective  trunk, extremities (8): Stuck-on, waxy, tan-brown papule or plaque --Discussed benign etiology and prognosis.   Objective  Left Nasal Sidewall: Dilated blood vessel   Assessment & Plan    Lentigines - Scattered tan macules - Discussed due to sun exposure - Benign, observe - Call for any changes  Seborrheic Keratoses - Stuck-on, waxy, tan-brown papules and plaques  - Discussed benign etiology and prognosis. - Observe - Call for any changes  Melanocytic Nevi - Tan-brown and/or pink-flesh-colored symmetric macules and papules - Benign appearing on exam today - Observation - Call clinic for new or changing moles - Recommend daily use of broad spectrum spf 30+ sunscreen to sun-exposed areas.   Hemangiomas - Red papules - Discussed benign nature - Observe - Call for any changes  Actinic Damage - Chronic, secondary to cumulative UV/sun exposure - diffuse scaly  erythematous macules with underlying dyspigmentation - Recommend daily broad spectrum sunscreen SPF 30+ to sun-exposed areas, reapply every 2 hours as needed.  - Call for new or changing lesions.  Skin cancer screening performed today.   AK (actinic keratosis) (5) Face  Destruction of lesion - Face Complexity: simple   Destruction method: cryotherapy   Informed consent: discussed and consent obtained   Timeout:  patient name, date of birth, surgical site, and procedure verified Lesion destroyed using liquid nitrogen: Yes   Region frozen until ice ball extended beyond lesion: Yes   Outcome: patient tolerated procedure well with no complications   Post-procedure details: wound care instructions given    Seborrheic keratosis (8) trunk, extremities  Discussed Ln2. Advised patient fee is $60 for first lesion and $15 each additional.  Destruction of lesion - trunk, extremities Complexity: simple   Destruction method: cryotherapy   Informed consent: discussed and consent obtained   Timeout:  patient name, date of birth, surgical site, and procedure verified Lesion destroyed using liquid nitrogen: Yes   Region frozen until ice ball extended beyond lesion: Yes   Outcome: patient tolerated procedure well with no complications   Post-procedure details: wound care instructions given    Telangiectasia Left Nasal Sidewall Benign. Discussed BBL. Advised patient fee is $200 for one spot.  Return in about 1 year (around 11/06/2020).   I, Ashok Cordia, CMA, am acting as scribe for Sarina Ser, MD .  Documentation: I have reviewed the above documentation for accuracy and completeness, and I agree with the above.  Sarina Ser, MD

## 2019-12-12 ENCOUNTER — Other Ambulatory Visit
Admission: RE | Admit: 2019-12-12 | Discharge: 2019-12-12 | Disposition: A | Discharge status: Home / Self Care | Payer: Medicare Other | Source: Ambulatory Visit | Attending: Gastroenterology | Admitting: Gastroenterology

## 2019-12-12 ENCOUNTER — Other Ambulatory Visit: Payer: Self-pay

## 2019-12-12 DIAGNOSIS — Z01812 Encounter for preprocedural laboratory examination: Secondary | ICD-10-CM | POA: Insufficient documentation

## 2019-12-12 DIAGNOSIS — Z20822 Contact with and (suspected) exposure to covid-19: Secondary | ICD-10-CM | POA: Insufficient documentation

## 2019-12-13 ENCOUNTER — Encounter: Payer: Self-pay | Admitting: *Deleted

## 2019-12-13 LAB — SARS CORONAVIRUS 2 (TAT 6-24 HRS): SARS Coronavirus 2: NEGATIVE

## 2019-12-16 ENCOUNTER — Encounter
Admission: RE | Disposition: A | Discharge status: Home / Self Care | Payer: Self-pay | Source: Home / Self Care | Attending: Gastroenterology

## 2019-12-16 ENCOUNTER — Ambulatory Visit: Payer: Medicare Other | Admitting: Anesthesiology

## 2019-12-16 ENCOUNTER — Ambulatory Visit
Admission: RE | Admit: 2019-12-16 | Discharge: 2019-12-16 | Disposition: A | Discharge status: Home / Self Care | Payer: Medicare Other | Attending: Gastroenterology | Admitting: Gastroenterology

## 2019-12-16 DIAGNOSIS — Z905 Acquired absence of kidney: Secondary | ICD-10-CM | POA: Diagnosis not present

## 2019-12-16 DIAGNOSIS — Z85828 Personal history of other malignant neoplasm of skin: Secondary | ICD-10-CM | POA: Diagnosis not present

## 2019-12-16 DIAGNOSIS — K2950 Unspecified chronic gastritis without bleeding: Secondary | ICD-10-CM | POA: Diagnosis not present

## 2019-12-16 DIAGNOSIS — K200 Eosinophilic esophagitis: Secondary | ICD-10-CM | POA: Diagnosis present

## 2019-12-16 DIAGNOSIS — Z888 Allergy status to other drugs, medicaments and biological substances status: Secondary | ICD-10-CM | POA: Diagnosis not present

## 2019-12-16 DIAGNOSIS — Z85528 Personal history of other malignant neoplasm of kidney: Secondary | ICD-10-CM | POA: Diagnosis not present

## 2019-12-16 DIAGNOSIS — Z79899 Other long term (current) drug therapy: Secondary | ICD-10-CM | POA: Insufficient documentation

## 2019-12-16 DIAGNOSIS — K31A11 Gastric intestinal metaplasia without dysplasia, involving the antrum: Secondary | ICD-10-CM | POA: Diagnosis not present

## 2019-12-16 DIAGNOSIS — K222 Esophageal obstruction: Secondary | ICD-10-CM | POA: Insufficient documentation

## 2019-12-16 DIAGNOSIS — Z881 Allergy status to other antibiotic agents status: Secondary | ICD-10-CM | POA: Diagnosis not present

## 2019-12-16 DIAGNOSIS — Z7951 Long term (current) use of inhaled steroids: Secondary | ICD-10-CM | POA: Insufficient documentation

## 2019-12-16 DIAGNOSIS — K317 Polyp of stomach and duodenum: Secondary | ICD-10-CM | POA: Diagnosis not present

## 2019-12-16 DIAGNOSIS — Z7982 Long term (current) use of aspirin: Secondary | ICD-10-CM | POA: Insufficient documentation

## 2019-12-16 DIAGNOSIS — Z86018 Personal history of other benign neoplasm: Secondary | ICD-10-CM | POA: Diagnosis not present

## 2019-12-16 HISTORY — DX: Cardiac arrhythmia, unspecified: I49.9

## 2019-12-16 HISTORY — DX: Pure hypercholesterolemia, unspecified: E78.00

## 2019-12-16 HISTORY — PX: ESOPHAGOGASTRODUODENOSCOPY: SHX5428

## 2019-12-16 HISTORY — DX: Cortical age-related cataract, unspecified eye: H25.019

## 2019-12-16 SURGERY — EGD (ESOPHAGOGASTRODUODENOSCOPY)
Anesthesia: General

## 2019-12-16 MED ORDER — PROPOFOL 10 MG/ML IV BOLUS
INTRAVENOUS | Status: DC | PRN
  Administered 2019-12-16: 20 mg via INTRAVENOUS
  Administered 2019-12-16: 50 mg via INTRAVENOUS
  Administered 2019-12-16: 10 mg via INTRAVENOUS
  Administered 2019-12-16: 20 mg via INTRAVENOUS

## 2019-12-16 MED ORDER — SODIUM CHLORIDE 0.9 % IV SOLN
INTRAVENOUS | Status: DC
  Administered 2019-12-16: 20 mL/h via INTRAVENOUS

## 2019-12-16 MED ORDER — PROPOFOL 500 MG/50ML IV EMUL
INTRAVENOUS | Status: DC | PRN
  Administered 2019-12-16: 180 ug/kg/min via INTRAVENOUS

## 2019-12-16 MED ORDER — PHENYLEPHRINE HCL (PRESSORS) 10 MG/ML IV SOLN
INTRAVENOUS | Status: DC | PRN
  Administered 2019-12-16: 100 ug via INTRAVENOUS

## 2019-12-16 NOTE — Transfer of Care (Signed)
Immediate Anesthesia Transfer of Care Note  Patient: Heidi Torres  Procedure(s) Performed: ESOPHAGOGASTRODUODENOSCOPY (EGD) (N/A )  Patient Location: PACU  Anesthesia Type:General  Level of Consciousness: awake, alert  and oriented  Airway & Oxygen Therapy: Patient Spontanous Breathing  Post-op Assessment: Report given to RN and Post -op Vital signs reviewed and stable  Post vital signs: Reviewed and stable  Last Vitals:  Vitals Value Taken Time  BP 109/46 12/16/19 1215  Temp 35.7 C 12/16/19 1215  Pulse    Resp 18 12/16/19 1215  SpO2 100 % 12/16/19 1215    Last Pain:  Vitals:   12/16/19 1215  TempSrc: Temporal  PainSc: 0-No pain         Complications: No complications documented.

## 2019-12-16 NOTE — Op Note (Signed)
[facility] Gastroenterology Patient Name: Heidi Torres Procedure Date: 12/16/2019 11:30 AM MRN: [id number] Account #: [id number] Date of Birth: 02-28-1955 Admit Type: Outpatient Age: 64 Room: [facility] ENDO ROOM 3 Gender: Female Note Status: Finalized Procedure:             Upper GI endoscopy Indications:           Dyspepsia, Eosinophilic esophagitis Providers:             Andrey Farmer MD, MD Referring MD:          [name] (Referring MD) Medicines:             Monitored Anesthesia Care Complications:         No immediate complications. Estimated blood loss:                         Minimal. Procedure:             Pre-Anesthesia Assessment:                        - Prior to the procedure, a History and Physical was                         performed, and patient medications and allergies were                         reviewed. The patient is competent. The risks and                         benefits of the procedure and the sedation options and                         risks were discussed with the patient. All questions                         were answered and informed consent was obtained.                         Patient identification and proposed procedure were                         verified by the physician, the nurse, the anesthetist                         and the technician in the endoscopy suite. Mental                         Status Examination: normal. Airway Examination: normal                         oropharyngeal airway and neck mobility. Respiratory                         Examination: clear to auscultation. CV Examination:                         normal. Prophylactic Antibiotics: The patient does not  require prophylactic antibiotics. Prior                         Anticoagulants: The patient has taken no previous                         anticoagulant or antiplatelet agents. ASA Grade                          Assessment: II - A patient with mild systemic disease.                         After reviewing the risks and benefits, the patient                         was deemed in satisfactory condition to undergo the                         procedure. The anesthesia plan was to use monitored                         anesthesia care (MAC). Immediately prior to                         administration of medications, the patient was                         re-assessed for adequacy to receive sedatives. The                         heart rate, respiratory rate, oxygen saturations,                         blood pressure, adequacy of pulmonary ventilation, and                         response to care were monitored throughout the                         procedure. The physical status of the patient was                         re-assessed after the procedure.                        After obtaining informed consent, the endoscope was                         passed under direct vision. Throughout the procedure,                         the patient's blood pressure, pulse, and oxygen                         saturations were monitored continuously. The Endoscope                         was introduced through the mouth, and advanced to the  second part of duodenum. The upper GI endoscopy was                         accomplished without difficulty. The patient tolerated                         the procedure well. Findings:      A widely patent Schatzki ring was found in the lower third of the       esophagus. This was not empirically dilated as patient denied dysphagia       prior to procedure.      The exam of the esophagus was otherwise normal.      Biopsies were taken with a cold forceps in the entire esophagus for       histology. Estimated blood loss was minimal.      Multiple 2 to 10 mm sessile fundic gland polyps with no stigmata of       recent bleeding were found in the gastric  body.      The exam of the stomach was otherwise normal.      Biopsies were taken with a cold forceps in the entire examined stomach       for histology. Estimated blood loss was minimal.      The examined duodenum was normal. Impression:            - Widely patent Schatzki ring.                        - Multiple fundic gland polyps.                        - Normal examined duodenum.                        - Biopsies were taken with a cold forceps for                         histology in the entire esophagus.                        - Biopsies were taken with a cold forceps for                         histology in the entire examined stomach. Recommendation:        - Discharge patient to home.                        - Resume previous diet.                        - Continue present medications.                        - Await pathology results.                        - Return to referring physician as previously                         scheduled. Procedure Code(s):     --- Professional ---  41583, Esophagogastroduodenoscopy, flexible,                         transoral; with biopsy, single or multiple Diagnosis Code(s):     --- Professional ---                        K22.2, Esophageal obstruction                        K31.7, Polyp of stomach and duodenum                        R10.13, Epigastric pain                        E94.0, Eosinophilic esophagitis CPT copyright 2019 [employer]. All rights reserved. The codes documented in this report are preliminary and upon coder review may  be revised to meet current compliance requirements. Andrey Farmer, MD Andrey Farmer MD, MD 12/16/2019 12:06:16 PM Number of Addenda: 0 Note Initiated On: 12/16/2019 11:30 AM Estimated Blood Loss:  Estimated blood loss was minimal.      [facility]

## 2019-12-16 NOTE — Anesthesia Preprocedure Evaluation (Signed)
Anesthesia Evaluation  Patient identified by MRN, date of birth, ID band Patient awake    Reviewed: Allergy & Precautions, NPO status , Patient's Chart, lab work & pertinent test results  History of Anesthesia Complications (+) PONV and history of anesthetic complications  Airway Mallampati: II  TM Distance: >3 FB Neck ROM: Full    Dental no notable dental hx. (+) Teeth Intact   Pulmonary asthma , neg COPD, Patient abstained from smoking.Not current smoker,  On regular advair, rescue inhaler 3x per week. Hospitalized long time ago once   Pulmonary exam normal breath sounds clear to auscultation       Cardiovascular Exercise Tolerance: Good METShypertension, (-) CAD and (-) Past MI + Valvular Problems/Murmurs  Rhythm:Regular Rate:Normal - Systolic murmurs    Neuro/Psych negative neurological ROS  negative psych ROS   GI/Hepatic Neg liver ROS, hiatal hernia, GERD  Poorly Controlled,  Endo/Other  neg diabetes  Renal/GU Renal diseaseHx renal cancer s/p nephrectomy     Musculoskeletal   Abdominal   Peds  Hematology   Anesthesia Other Findings Past Medical History: No date: Asthma No date: Cataract cortical, senile No date: Complication of anesthesia No date: Dysrhythmia No date: GERD (gastroesophageal reflux disease) No date: Headache     Comment:  occular HA No date: Heart murmur No date: History of hiatal hernia No date: Hx of dysplastic nevus     Comment:  multiple sites 06/22/2015: Hx of squamous cell carcinoma of skin     Comment:  R infrascapular, SCC in situ No date: Hypercholesteremia No date: Hypertension No date: Irregular heart beat     Comment:  pac's and pvc's No date: Pneumonia     Comment:  11-2016 No date: PONV (postoperative nausea and vomiting) No date: Renal cell cancer, left (HCC)   Reproductive/Obstetrics                             Anesthesia  Physical  Anesthesia Plan  ASA: II  Anesthesia Plan: General   Post-op Pain Management:    Induction: Intravenous  PONV Risk Score and Plan: 4 or greater and Ondansetron, Propofol infusion and TIVA  Airway Management Planned: Nasal Cannula  Additional Equipment: None  Intra-op Plan:   Post-operative Plan:   Informed Consent: I have reviewed the patients History and Physical, chart, labs and discussed the procedure including the risks, benefits and alternatives for the proposed anesthesia with the patient or authorized representative who has indicated his/her understanding and acceptance.     Dental advisory given  Plan Discussed with: CRNA and Surgeon  Anesthesia Plan Comments: (Discussed risks of anesthesia with patient, including possibility of difficulty with spontaneous ventilation under anesthesia necessitating airway intervention, PONV, and rare risks such as cardiac or respiratory events. Patient understands.  )        Anesthesia Quick Evaluation

## 2019-12-16 NOTE — H&P (Signed)
Outpatient short stay form Pre-procedure 12/16/2019 11:40 AM Heidi Miyamoto MD, MPH  Primary Physician: Dr. Netty Starring  Reason for visit:  Dyspepsia  History of present illness:   64 y/o lady with history of peripheral eosinophilia and EoE here for EGD for recurrent dyspeptic symptoms. Has tried an elimination diet and doesn't have any symptoms if she sticks with the diet. No dysphagia. History of kidney removal.     Current Facility-Administered Medications:    0.9 %  sodium chloride infusion, , Intravenous, Continuous, Kirt Chew, Hilton Cork, MD, Last Rate: 20 mL/hr at 12/16/19 1104, 20 mL/hr at 12/16/19 1104  Medications Prior to Admission  Medication Sig Dispense Refill Last Dose   ADVAIR DISKUS 500-50 MCG/DOSE AEPB TAKE 1 PUFF BY MOUTH TWICE A DAY 60 each 0 12/15/2019 at Unknown time   albuterol (VENTOLIN HFA) 108 (90 Base) MCG/ACT inhaler Inhale 2 puffs into the lungs every 4 (four) hours as needed.    12/15/2019 at Unknown time   ALPRAZolam (XANAX) 0.25 MG tablet Take 0.25 mg by mouth at bedtime as needed for anxiety. Anxiety.  5 12/15/2019 at Unknown time   aspirin EC 81 MG tablet Take 81 mg by mouth daily.   12/15/2019 at Unknown time   atorvastatin (LIPITOR) 40 MG tablet TAKE 1 TABLET BY MOUTH EVERY DAY AT NIGHT   12/15/2019 at Unknown time   Cholecalciferol (VITAMIN D3) 1000 units CAPS Take 1 capsule by mouth daily.   12/15/2019 at Unknown time   fluticasone (FLONASE) 50 MCG/ACT nasal spray Place 2 sprays into both nostrils daily.   12/15/2019 at Unknown time   hydrALAZINE (APRESOLINE) 25 MG tablet Take 25 mg by mouth 2 (two) times daily. As needed for bp GREATER THAN 180-160/100   12/15/2019 at Unknown time   hydrochlorothiazide (HYDRODIURIL) 12.5 MG tablet Take 12.5 mg by mouth daily.   12/15/2019 at Unknown time   ipratropium-albuterol (DUONEB) 0.5-2.5 (3) MG/3ML SOLN Take 3 mLs by nebulization.   12/15/2019 at Unknown time   levalbuterol [facility] HFA) 45 MCG/ACT  inhaler    12/15/2019 at Unknown time   Mepolizumab (NUCALA) 100 MG/ML SOAJ INJECT 1 PEN UNDER THE SKIN EVERY 28 DAYS 1 mL 11 12/15/2019 at Unknown time   montelukast (SINGULAIR) 10 MG tablet Take 10 mg by mouth daily.   12/15/2019 at Unknown time   olmesartan (BENICAR) 20 MG tablet Take 20 mg by mouth daily.   12/15/2019 at Unknown time   pantoprazole (PROTONIX) 40 MG tablet Take 40 mg by mouth 2 (two) times daily.   12/15/2019 at Unknown time   potassium chloride (MICRO-K) 10 MEQ CR capsule Take 20 mEq by mouth daily.  11 12/15/2019 at Unknown time   ranitidine (ZANTAC) 75 MG tablet Take 75 mg by mouth 2 (two) times daily.   12/15/2019 at Unknown time   triamterene-hydrochlorothiazide (DYAZIDE) 37.5-25 MG per capsule Take 1 capsule by mouth daily.   12/15/2019 at Unknown time   trimethoprim (TRIMPEX) 100 MG tablet Take 1 tablet (100 mg total) by mouth daily. 30 tablet 11 12/15/2019 at Unknown time   Fluticasone-Umeclidin-Vilant (TRELEGY ELLIPTA) 200-62.5-25 MCG/INH AEPB Inhale 1 puff into the lungs daily. 14 each 0    levalbuterol (XOPENEX) 0.63 MG/3ML nebulizer solution Take 3 mLs (0.63 mg total) by nebulization every 4 (four) hours as needed for wheezing or shortness of breath. Use as needed if Xopenex inhaler provides insufficient relief of shortness of breath, wheezing, chest tightness 30 mL 5      Allergies  Allergen  Reactions   Benzoin     Other reaction(s): Other (See Comments) Blisters   Ciprofloxacin Other (See Comments)    Arms were numb   Steri-Strip Compound Benzoin [Benzoin Compound] Other (See Comments)    Blisters     Past Medical History:  Diagnosis Date   Asthma    Cataract cortical, senile    Complication of anesthesia    Dysrhythmia    GERD (gastroesophageal reflux disease)    Headache    occular HA   Heart murmur    History of hiatal hernia    Hx of dysplastic nevus    multiple sites   Hx of squamous cell carcinoma of skin 06/22/2015    R infrascapular, SCC in situ   Hypercholesteremia    Hypertension    Irregular heart beat    pac's and pvc's   Pneumonia    11-2016   PONV (postoperative nausea and vomiting)    Renal cell cancer, left (HCC)     Review of systems:  Otherwise negative.    Physical Exam  Gen: Alert, oriented. Appears stated age.  HEENT: PERRLA. Lungs: No respiratory distress CV: RRR Abd: soft, benign, no masses Ext: No edema.    Planned procedures: Proceed with EGD. The patient understands the nature of the planned procedure, indications, risks, alternatives and potential complications including but not limited to bleeding, infection, perforation, damage to internal organs and possible oversedation/side effects from anesthesia. The patient agrees and gives consent to proceed.  Please refer to procedure notes for findings, recommendations and patient disposition/instructions.     Heidi Miyamoto MD, MPH Gastroenterology 12/16/2019  11:40 AM

## 2019-12-16 NOTE — Interval H&P Note (Signed)
History and Physical Interval Note:  12/16/2019 11:42 AM  Heidi Torres  has presented today for surgery, with the diagnosis of DYSPEPSIA,EOSINOPHILIC ESOPHAGITIS.  The various methods of treatment have been discussed with the patient and family. After consideration of risks, benefits and other options for treatment, the patient has consented to  Procedure(s): ESOPHAGOGASTRODUODENOSCOPY (EGD) (N/A) as a surgical intervention.  The patient's history has been reviewed, patient examined, no change in status, stable for surgery.  I have reviewed the patient's chart and labs.  Questions were answered to the patient's satisfaction.     Lesly Rubenstein  Ok to proceed with EGD

## 2019-12-16 NOTE — Anesthesia Procedure Notes (Signed)
Date/Time: 12/16/2019 11:50 AM Performed by: Allean Found, CRNA Pre-anesthesia Checklist: Patient identified, Emergency Drugs available, Suction available and Timeout performed Oxygen Delivery Method: Nasal cannula Placement Confirmation: positive ETCO2

## 2019-12-17 ENCOUNTER — Encounter: Payer: Self-pay | Admitting: Gastroenterology

## 2019-12-17 NOTE — Progress Notes (Signed)
PT states she has a little issusse and she will call later about this issuse.

## 2019-12-17 NOTE — Anesthesia Postprocedure Evaluation (Signed)
Anesthesia Post Note  Patient: Heidi Torres  Procedure(s) Performed: ESOPHAGOGASTRODUODENOSCOPY (EGD) (N/A )  Patient location during evaluation: Endoscopy Anesthesia Type: General Level of consciousness: awake and alert Pain management: pain level controlled Vital Signs Assessment: post-procedure vital signs reviewed and stable Respiratory status: spontaneous breathing, nonlabored ventilation, respiratory function stable and patient connected to nasal cannula oxygen Cardiovascular status: blood pressure returned to baseline and stable Postop Assessment: no apparent nausea or vomiting Anesthetic complications: no   No complications documented.   Last Vitals:  Vitals:   12/16/19 1225 12/16/19 1236  BP: 118/63 128/75  Pulse: (!) 50 (!) 50  Resp: 20 18  Temp:    SpO2: 100% 100%    Last Pain:  Vitals:   12/17/19 0758  TempSrc:   PainSc: 0-No pain                 Arita Miss

## 2019-12-18 LAB — SURGICAL PATHOLOGY

## 2020-01-27 ENCOUNTER — Other Ambulatory Visit: Payer: Self-pay | Admitting: Pulmonary Disease

## 2020-02-12 ENCOUNTER — Encounter: Payer: Self-pay | Admitting: Emergency Medicine

## 2020-02-12 ENCOUNTER — Other Ambulatory Visit: Payer: Self-pay

## 2020-02-12 ENCOUNTER — Inpatient Hospital Stay
Admission: EM | Admit: 2020-02-12 | Discharge: 2020-02-13 | DRG: 309 | Disposition: A | Discharge status: Home / Self Care | Payer: Medicare Other | Attending: Internal Medicine | Admitting: Internal Medicine

## 2020-02-12 ENCOUNTER — Emergency Department: Payer: Medicare Other

## 2020-02-12 DIAGNOSIS — K449 Diaphragmatic hernia without obstruction or gangrene: Secondary | ICD-10-CM | POA: Diagnosis present

## 2020-02-12 DIAGNOSIS — I4891 Unspecified atrial fibrillation: Secondary | ICD-10-CM

## 2020-02-12 DIAGNOSIS — J45901 Unspecified asthma with (acute) exacerbation: Secondary | ICD-10-CM

## 2020-02-12 DIAGNOSIS — Z20822 Contact with and (suspected) exposure to covid-19: Secondary | ICD-10-CM | POA: Diagnosis present

## 2020-02-12 DIAGNOSIS — N1831 Chronic kidney disease, stage 3a: Secondary | ICD-10-CM | POA: Diagnosis present

## 2020-02-12 DIAGNOSIS — Z905 Acquired absence of kidney: Secondary | ICD-10-CM

## 2020-02-12 DIAGNOSIS — Z79899 Other long term (current) drug therapy: Secondary | ICD-10-CM

## 2020-02-12 DIAGNOSIS — Z883 Allergy status to other anti-infective agents status: Secondary | ICD-10-CM | POA: Diagnosis not present

## 2020-02-12 DIAGNOSIS — Z960 Presence of urogenital implants: Secondary | ICD-10-CM | POA: Diagnosis present

## 2020-02-12 DIAGNOSIS — Z85528 Personal history of other malignant neoplasm of kidney: Secondary | ICD-10-CM | POA: Diagnosis not present

## 2020-02-12 DIAGNOSIS — Z91048 Other nonmedicinal substance allergy status: Secondary | ICD-10-CM | POA: Diagnosis not present

## 2020-02-12 DIAGNOSIS — J4541 Moderate persistent asthma with (acute) exacerbation: Secondary | ICD-10-CM | POA: Diagnosis present

## 2020-02-12 DIAGNOSIS — Z7951 Long term (current) use of inhaled steroids: Secondary | ICD-10-CM | POA: Diagnosis not present

## 2020-02-12 DIAGNOSIS — I10 Essential (primary) hypertension: Secondary | ICD-10-CM | POA: Diagnosis not present

## 2020-02-12 DIAGNOSIS — Z7982 Long term (current) use of aspirin: Secondary | ICD-10-CM | POA: Diagnosis not present

## 2020-02-12 DIAGNOSIS — J3089 Other allergic rhinitis: Secondary | ICD-10-CM | POA: Diagnosis present

## 2020-02-12 DIAGNOSIS — Z90710 Acquired absence of both cervix and uterus: Secondary | ICD-10-CM

## 2020-02-12 DIAGNOSIS — N179 Acute kidney failure, unspecified: Secondary | ICD-10-CM | POA: Diagnosis present

## 2020-02-12 DIAGNOSIS — Z8249 Family history of ischemic heart disease and other diseases of the circulatory system: Secondary | ICD-10-CM

## 2020-02-12 DIAGNOSIS — E782 Mixed hyperlipidemia: Secondary | ICD-10-CM | POA: Diagnosis present

## 2020-02-12 DIAGNOSIS — Z881 Allergy status to other antibiotic agents status: Secondary | ICD-10-CM | POA: Diagnosis not present

## 2020-02-12 DIAGNOSIS — I129 Hypertensive chronic kidney disease with stage 1 through stage 4 chronic kidney disease, or unspecified chronic kidney disease: Secondary | ICD-10-CM | POA: Diagnosis present

## 2020-02-12 DIAGNOSIS — I480 Paroxysmal atrial fibrillation: Secondary | ICD-10-CM

## 2020-02-12 DIAGNOSIS — K219 Gastro-esophageal reflux disease without esophagitis: Secondary | ICD-10-CM | POA: Diagnosis present

## 2020-02-12 DIAGNOSIS — Z85828 Personal history of other malignant neoplasm of skin: Secondary | ICD-10-CM

## 2020-02-12 DIAGNOSIS — E876 Hypokalemia: Secondary | ICD-10-CM | POA: Diagnosis present

## 2020-02-12 DIAGNOSIS — R011 Cardiac murmur, unspecified: Secondary | ICD-10-CM | POA: Diagnosis present

## 2020-02-12 DIAGNOSIS — D631 Anemia in chronic kidney disease: Secondary | ICD-10-CM | POA: Diagnosis present

## 2020-02-12 LAB — T4, FREE: Free T4: 0.89 ng/dL (ref 0.61–1.12)

## 2020-02-12 LAB — MAGNESIUM
Magnesium: 2 mg/dL (ref 1.7–2.4)
Magnesium: 2 mg/dL (ref 1.7–2.4)

## 2020-02-12 LAB — COMPREHENSIVE METABOLIC PANEL
ALT: 29 U/L (ref 0–44)
AST: 25 U/L (ref 15–41)
Albumin: 4.4 g/dL (ref 3.5–5.0)
Alkaline Phosphatase: 61 U/L (ref 38–126)
Anion gap: 11 (ref 5–15)
BUN: 31 mg/dL — ABNORMAL HIGH (ref 8–23)
CO2: 25 mmol/L (ref 22–32)
Calcium: 9.8 mg/dL (ref 8.9–10.3)
Chloride: 102 mmol/L (ref 98–111)
Creatinine, Ser: 1.31 mg/dL — ABNORMAL HIGH (ref 0.44–1.00)
GFR, Estimated: 45 mL/min — ABNORMAL LOW (ref >60)
Glucose, Bld: 112 mg/dL — ABNORMAL HIGH (ref 70–99)
Potassium: 3.2 mmol/L — ABNORMAL LOW (ref 3.5–5.1)
Sodium: 138 mmol/L (ref 135–145)
Total Bilirubin: 0.5 mg/dL (ref 0.3–1.2)
Total Protein: 7.6 g/dL (ref 6.5–8.1)

## 2020-02-12 LAB — CBC
HCT: 36.5 % (ref 36.0–46.0)
Hemoglobin: 11.8 g/dL — ABNORMAL LOW (ref 12.0–15.0)
MCH: 28 pg (ref 26.0–34.0)
MCHC: 32.3 g/dL (ref 30.0–36.0)
MCV: 86.7 fL (ref 80.0–100.0)
Platelets: 272 10*3/uL (ref 150–400)
RBC: 4.21 MIL/uL (ref 3.87–5.11)
RDW: 13.6 % (ref 11.5–15.5)
WBC: 9.6 10*3/uL (ref 4.0–10.5)
nRBC: 0 % (ref 0.0–0.2)

## 2020-02-12 LAB — TSH: TSH: 2.965 u[IU]/mL (ref 0.350–4.500)

## 2020-02-12 LAB — TROPONIN I (HIGH SENSITIVITY)
Troponin I (High Sensitivity): 31 ng/L — ABNORMAL HIGH (ref <18)
Troponin I (High Sensitivity): 120 ng/L (ref <18)

## 2020-02-12 LAB — HEPARIN LEVEL (UNFRACTIONATED): Heparin Unfractionated: 0.89 IU/mL — ABNORMAL HIGH (ref 0.30–0.70)

## 2020-02-12 LAB — POTASSIUM: Potassium: 3.8 mmol/L (ref 3.5–5.1)

## 2020-02-12 LAB — APTT: aPTT: 30 seconds (ref 24–36)

## 2020-02-12 LAB — HIV ANTIBODY (ROUTINE TESTING W REFLEX): HIV Screen 4th Generation wRfx: NONREACTIVE

## 2020-02-12 LAB — RESP PANEL BY RT-PCR (FLU A&B, COVID) ARPGX2
Influenza A by PCR: NEGATIVE
Influenza B by PCR: NEGATIVE
SARS Coronavirus 2 by RT PCR: NEGATIVE

## 2020-02-12 MED ORDER — ALUM & MAG HYDROXIDE-SIMETH 200-200-20 MG/5ML PO SUSP
15.0000 mL | Freq: Once | ORAL | Status: AC
  Administered 2020-02-12: 15 mL via ORAL

## 2020-02-12 MED ORDER — APIXABAN 5 MG PO TABS
5.0000 mg | ORAL_TABLET | Freq: Two times a day (BID) | ORAL | Status: DC
  Administered 2020-02-12 – 2020-02-13 (×2): 5 mg via ORAL
  Filled 2020-02-12 (×2): qty 1

## 2020-02-12 MED ORDER — ATORVASTATIN CALCIUM 20 MG PO TABS
40.0000 mg | ORAL_TABLET | Freq: Every day | ORAL | Status: DC
Start: 2020-02-12 — End: 2020-02-13
  Administered 2020-02-12 – 2020-02-13 (×2): 40 mg via ORAL
  Filled 2020-02-12 (×3): qty 2

## 2020-02-12 MED ORDER — FLUTICASONE PROPIONATE 50 MCG/ACT NA SUSP
2.0000 | Freq: Every day | NASAL | Status: DC
  Filled 2020-02-12: qty 16

## 2020-02-12 MED ORDER — ALUM & MAG HYDROXIDE-SIMETH 200-200-20 MG/5ML PO SUSP
30.0000 mL | Freq: Once | ORAL | Status: AC
  Administered 2020-02-12: 30 mL via ORAL
  Filled 2020-02-12: qty 30

## 2020-02-12 MED ORDER — AMIODARONE HCL IN DEXTROSE 360-4.14 MG/200ML-% IV SOLN
60.0000 mg/h | INTRAVENOUS | Status: DC
  Administered 2020-02-12 (×2): 60 mg/h via INTRAVENOUS
  Filled 2020-02-12: qty 200
  Filled 2020-02-12: qty 400

## 2020-02-12 MED ORDER — APIXABAN 5 MG PO TABS
5.0000 mg | ORAL_TABLET | Freq: Two times a day (BID) | ORAL | Status: DC
  Administered 2020-02-12: 5 mg via ORAL
  Filled 2020-02-12 (×2): qty 1

## 2020-02-12 MED ORDER — SODIUM CHLORIDE 0.9 % IV SOLN: INTRAVENOUS | Status: DC

## 2020-02-12 MED ORDER — DILTIAZEM HCL 25 MG/5ML IV SOLN
10.0000 mg | Freq: Once | INTRAVENOUS | Status: AC
  Administered 2020-02-12: 10 mg via INTRAVENOUS

## 2020-02-12 MED ORDER — AMIODARONE HCL IN DEXTROSE 360-4.14 MG/200ML-% IV SOLN
30.0000 mg/h | INTRAVENOUS | Status: DC
  Administered 2020-02-12: 59.9999 mg/h via INTRAVENOUS

## 2020-02-12 MED ORDER — ASPIRIN EC 81 MG PO TBEC
81.0000 mg | DELAYED_RELEASE_TABLET | Freq: Every day | ORAL | Status: DC
  Administered 2020-02-12: 81 mg via ORAL
  Filled 2020-02-12: qty 1

## 2020-02-12 MED ORDER — DILTIAZEM HCL 25 MG/5ML IV SOLN: 10.0000 mg | Freq: Once | INTRAVENOUS | Status: AC

## 2020-02-12 MED ORDER — DILTIAZEM HCL 25 MG/5ML IV SOLN
INTRAVENOUS | Status: AC
  Administered 2020-02-12: 10 mg via INTRAVENOUS
  Filled 2020-02-12: qty 5

## 2020-02-12 MED ORDER — HEPARIN (PORCINE) 25000 UT/250ML-% IV SOLN
1000.0000 [IU]/h | INTRAVENOUS | Status: DC
  Filled 2020-02-12: qty 250

## 2020-02-12 MED ORDER — ALUM & MAG HYDROXIDE-SIMETH 200-200-20 MG/5ML PO SUSP
30.0000 mL | ORAL | Status: DC | PRN
  Filled 2020-02-12: qty 30

## 2020-02-12 MED ORDER — POTASSIUM CHLORIDE CRYS ER 20 MEQ PO TBCR
20.0000 meq | EXTENDED_RELEASE_TABLET | Freq: Every day | ORAL | Status: DC
  Administered 2020-02-12 – 2020-02-13 (×2): 20 meq via ORAL
  Filled 2020-02-12 (×3): qty 1

## 2020-02-12 MED ORDER — DILTIAZEM HCL-DEXTROSE 125-5 MG/125ML-% IV SOLN (PREMIX)
5.0000 mg/h | INTRAVENOUS | Status: DC
  Administered 2020-02-12: 5 mg/h via INTRAVENOUS

## 2020-02-12 MED ORDER — IRBESARTAN 75 MG PO TABS
75.0000 mg | ORAL_TABLET | Freq: Every day | ORAL | Status: DC
  Filled 2020-02-12 (×2): qty 1

## 2020-02-12 MED ORDER — ONDANSETRON HCL 4 MG/2ML IJ SOLN: 4.0000 mg | Freq: Four times a day (QID) | INTRAMUSCULAR | Status: DC | PRN

## 2020-02-12 MED ORDER — ALPRAZOLAM 0.25 MG PO TABS
0.2500 mg | ORAL_TABLET | Freq: Every evening | ORAL | Status: DC | PRN
  Filled 2020-02-12: qty 1

## 2020-02-12 MED ORDER — LIDOCAINE VISCOUS HCL 2 % MT SOLN
15.0000 mL | Freq: Once | OROMUCOSAL | Status: AC
  Administered 2020-02-12: 15 mL via ORAL
  Filled 2020-02-12: qty 15

## 2020-02-12 MED ORDER — TRIAMTERENE-HCTZ 37.5-25 MG PO CAPS
1.0000 | ORAL_CAPSULE | Freq: Every day | ORAL | Status: DC
  Filled 2020-02-12: qty 1

## 2020-02-12 MED ORDER — ALBUTEROL SULFATE HFA 108 (90 BASE) MCG/ACT IN AERS
2.0000 | INHALATION_SPRAY | RESPIRATORY_TRACT | Status: DC | PRN
  Filled 2020-02-12: qty 6.7

## 2020-02-12 MED ORDER — POTASSIUM CHLORIDE 10 MEQ/100ML IV SOLN
10.0000 meq | Freq: Once | INTRAVENOUS | Status: AC
  Administered 2020-02-12: 10 meq via INTRAVENOUS
  Filled 2020-02-12: qty 100

## 2020-02-12 MED ORDER — MONTELUKAST SODIUM 10 MG PO TABS
10.0000 mg | ORAL_TABLET | Freq: Every day | ORAL | Status: DC
  Administered 2020-02-12 – 2020-02-13 (×2): 10 mg via ORAL
  Filled 2020-02-12 (×2): qty 1

## 2020-02-12 MED ORDER — HYDRALAZINE HCL 25 MG PO TABS
25.0000 mg | ORAL_TABLET | Freq: Two times a day (BID) | ORAL | Status: DC
  Administered 2020-02-13: 25 mg via ORAL
  Filled 2020-02-12 (×3): qty 1

## 2020-02-12 MED ORDER — IPRATROPIUM-ALBUTEROL 0.5-2.5 (3) MG/3ML IN SOLN: 3.0000 mL | RESPIRATORY_TRACT | Status: DC | PRN

## 2020-02-12 MED ORDER — PANTOPRAZOLE SODIUM 40 MG PO TBEC
40.0000 mg | DELAYED_RELEASE_TABLET | Freq: Two times a day (BID) | ORAL | Status: DC
  Administered 2020-02-12 – 2020-02-13 (×3): 40 mg via ORAL
  Filled 2020-02-12 (×3): qty 1

## 2020-02-12 MED ORDER — DILTIAZEM HCL 30 MG PO TABS: 30.0000 mg | ORAL_TABLET | Freq: Four times a day (QID) | ORAL | Status: DC | PRN

## 2020-02-12 MED ORDER — DILTIAZEM HCL-DEXTROSE 125-5 MG/125ML-% IV SOLN (PREMIX): 5.0000 mg/h | INTRAVENOUS | Status: DC

## 2020-02-12 MED ORDER — IRBESARTAN 150 MG PO TABS
75.0000 mg | ORAL_TABLET | Freq: Every day | ORAL | Status: DC
  Administered 2020-02-12: 75 mg via ORAL
  Filled 2020-02-12 (×2): qty 1

## 2020-02-12 MED ORDER — ACETAMINOPHEN 325 MG PO TABS
650.0000 mg | ORAL_TABLET | ORAL | Status: DC | PRN
  Administered 2020-02-13: 650 mg via ORAL
  Filled 2020-02-12: qty 2

## 2020-02-12 MED ORDER — TRIAMTERENE-HCTZ 37.5-25 MG PO TABS
1.0000 | ORAL_TABLET | Freq: Every day | ORAL | Status: DC
  Administered 2020-02-12 – 2020-02-13 (×2): 1 via ORAL
  Filled 2020-02-12 (×2): qty 1

## 2020-02-12 MED ORDER — LEVALBUTEROL HCL 0.63 MG/3ML IN NEBU: 0.6300 mg | INHALATION_SOLUTION | RESPIRATORY_TRACT | Status: DC | PRN

## 2020-02-12 MED ORDER — POTASSIUM CHLORIDE CRYS ER 20 MEQ PO TBCR
40.0000 meq | EXTENDED_RELEASE_TABLET | Freq: Once | ORAL | Status: AC
  Administered 2020-02-12: 40 meq via ORAL
  Filled 2020-02-12: qty 2

## 2020-02-12 MED ORDER — VITAMIN D3 25 MCG (1000 UNIT) PO TABS
1000.0000 [IU] | ORAL_TABLET | Freq: Every day | ORAL | Status: DC
  Administered 2020-02-12 – 2020-02-13 (×2): 1000 [IU] via ORAL
  Filled 2020-02-12 (×5): qty 1

## 2020-02-12 MED ORDER — FAMOTIDINE 20 MG PO TABS
20.0000 mg | ORAL_TABLET | Freq: Every day | ORAL | Status: DC
  Administered 2020-02-12 – 2020-02-13 (×2): 20 mg via ORAL
  Filled 2020-02-12 (×2): qty 1

## 2020-02-12 MED ORDER — DILTIAZEM HCL 25 MG/5ML IV SOLN
10.0000 mg | Freq: Once | INTRAVENOUS | Status: AC
  Administered 2020-02-12: 10 mg via INTRAVENOUS
  Filled 2020-02-12: qty 5

## 2020-02-12 MED ORDER — SODIUM CHLORIDE 0.9 % IV SOLN: Freq: Once | INTRAVENOUS | Status: AC

## 2020-02-12 MED ORDER — DILTIAZEM LOAD VIA INFUSION
10.0000 mg | Freq: Once | INTRAVENOUS | Status: DC
  Administered 2020-02-12: 10 mg via INTRAVENOUS

## 2020-02-12 MED ORDER — AMIODARONE LOAD VIA INFUSION
150.0000 mg | Freq: Once | INTRAVENOUS | Status: AC
  Administered 2020-02-12: 150 mg via INTRAVENOUS
  Filled 2020-02-12: qty 83.34

## 2020-02-12 MED ORDER — AMIODARONE HCL 200 MG PO TABS
200.0000 mg | ORAL_TABLET | Freq: Two times a day (BID) | ORAL | Status: DC
  Administered 2020-02-12: 200 mg via ORAL
  Filled 2020-02-12: qty 1

## 2020-02-12 NOTE — Progress Notes (Signed)
Pt arrived to the unit She appears to be in no apparent distress Able to ambulate from stretcher to bed Vitals obtained.  Tele monitor placed. HR in the 50's Sinus brady Providers notified. Per Dr Clayborn Bigness. Cardiac gtt stopped. Pt oriented to room and call bell Dinner tray provided. Patient family notified by patient of arrival  Pt verbalizes an understanding on plan of care Denies any additional wants or needs at this time Call bell within reach. Will continue to monitor.

## 2020-02-12 NOTE — Consult Note (Addendum)
ANTICOAGULATION CONSULT NOTE - Initial Consult  Pharmacy Consult for heparin dosing Indication: atrial fibrillation  Allergies  Allergen Reactions  . Benzoin     Other reaction(s): Other (See Comments) Blisters  . Ciprofloxacin Other (See Comments)    Arms were numb  . Steri-Strip Compound Benzoin [Benzoin Compound] Other (See Comments)    Blisters    Patient Measurements: Height: 5\' 6"  (167.6 cm) Weight: 71.7 kg (158 lb) IBW/kg (Calculated) : 59.3 Heparin Dosing Weight: 71.7 kg  Vital Signs: BP: 108/69 (02/09 1619) Pulse Rate: 65 (02/09 1619)  Labs: Recent Labs    02/12/20 0321 02/12/20 0543  HGB 11.8*  --   HCT 36.5  --   PLT 272  --   CREATININE 1.31*  --   TROPONINIHS 31* 120*    Estimated Creatinine Clearance: 43.5 mL/min (A) (by C-G formula based on SCr of 1.31 mg/dL (H)).   Medical History: Past Medical History:  Diagnosis Date  . Asthma   . Cataract cortical, senile   . Complication of anesthesia   . Dysrhythmia   . GERD (gastroesophageal reflux disease)   . Headache    occular HA  . Heart murmur   . History of hiatal hernia   . Hx of dysplastic nevus    multiple sites  . Hx of squamous cell carcinoma of skin 06/22/2015   R infrascapular, SCC in situ  . Hypercholesteremia   . Hypertension   . Irregular heart beat    pac's and pvc's  . Pneumonia    11-2016  . PONV (postoperative nausea and vomiting)   . Renal cell cancer, left (HCC)     Medications:  Scheduled:  . aspirin EC  81 mg Oral Daily  . atorvastatin  40 mg Oral Daily  . cholecalciferol  1,000 Units Oral Daily  . famotidine  20 mg Oral Daily  . fluticasone  2 spray Each Nare Daily  . hydrALAZINE  25 mg Oral BID  . irbesartan  75 mg Oral Daily  . montelukast  10 mg Oral Daily  . pantoprazole  40 mg Oral BID  . potassium chloride  20 mEq Oral Daily  . triamterene-hydrochlorothiazide  1 tablet Oral Daily  - Apixaban   5 mg tablet  Oral  Once @1103 -02/12/20  Assessment: Pt is  a 65 y.o. female presenting to the ED on 02/12/20 due to chest pain associated with palpitations. PMH includes asthma, A.fib, GERD, hypercholesterolemia, and HTN. EKG shows A.fib w/ RVR of 144 w/ LVH and prolonged QT interval of 497 ms. CXR shows bibasilar atelectasis. Pharmacy has been consulted for heparin dosing indicated for A.fib  Goal of Therapy:  Heparin level 0.3-0.7 units/ml aPTT 66-102 seconds Monitor platelets by anticoagulation protocol: Yes   Plan:  Pt received morning dose of apixaban so a heparin bolus was considered unnecessary Start heparin infusion at 1000 units/hr at 2300 on 02/12/20 Baseline HL and aPTT levels ordered Check anti-Xa level in 6 hours and daily while on heparin Continue to monitor H&H and platelets  Chrystie Nose, PharmD Student 02/12/2020,4:36 PM

## 2020-02-12 NOTE — ED Notes (Signed)
X-ray at bedside

## 2020-02-12 NOTE — ED Provider Notes (Signed)
[facility] Emergency Department Provider Note  ___________________________________________   Event Date/Time   First MD Initiated Contact with Patient 02/12/20 [phone]     (approximate)  I have reviewed the triage vital signs and the nursing notes.   HISTORY  Chief Complaint Chest Pain    HPI Heidi Torres is a 65 y.o. female here with shortness of breath, palpitations, and indigestion.  The patient states she woke up just prior to arrival with severe indigestion and burping.  She states she had bacon bits for dinner and feels like this was the cause of her issues.  She has chronic reflux but also has A. fib which she believes is precipitated by reflux.  She states she took Tums and tried to sit up to help her symptoms but it was "too late."  She states she has had palpitations, shortness of breath, and fatigue.  She states that she has had some mild chest pressure but this has not been constant.  Symptoms feel similar to her previous episodes of A. fib.  She states that normally when she sits up and tries to take antacids this improves her symptoms, but because they persisted today she did take her diltiazem 30 mg tablet which was prescribed by her cardiologist.  She denies any fevers.  Denies any recent illnesses.  No other changes in health.        Past Medical History:  Diagnosis Date  . Asthma   . Cataract cortical, senile   . Complication of anesthesia   . Dysrhythmia   . GERD (gastroesophageal reflux disease)   . Headache    occular HA  . Heart murmur   . History of hiatal hernia   . Hx of dysplastic nevus    multiple sites  . Hx of squamous cell carcinoma of skin 06/22/2015   R infrascapular, SCC in situ  . Hypercholesteremia   . Hypertension   . Irregular heart beat    pac's and pvc's  . Pneumonia    11-2016  . PONV (postoperative nausea and vomiting)   . Renal cell cancer, left [facility])     Patient Active Problem List   Diagnosis  Date Noted  . Mixed hyperlipidemia 01/28/2019  . Stage 3 chronic kidney disease ([place]) 09/25/2018  . Moderate persistent asthma 09/05/2018  . Eosinophilia 09/05/2018  . Essential hypertension 07/27/2018  . Perennial allergic rhinitis with seasonal variation 07/27/2018  . Pure hypercholesterolemia 07/27/2018  . Chemotherapy induced nausea and vomiting 07/01/2017  . Chemotherapy induced diarrhea 07/01/2017  . Acute renal insufficiency 07/01/2017  . Thyroid dysfunction 07/01/2017  . Renal cell carcinoma of left kidney ([place]) 03/21/2017  . Cancer of left kidney ([place]) 02/24/2017  . Esophagitis, reflux 10/21/2013    Past Surgical History:  Procedure Laterality Date  . ABDOMINAL HYSTERECTOMY    . ABLATION     prior to hysterectomy  . CYSTOSCOPY/RETROGRADE/URETEROSCOPY     01-20-17 Dr. Junious Silk  . CYSTOSCOPY/RETROGRADE/URETEROSCOPY Left 01/20/2017   Procedure: CYSTOSCOPY/RETROGRADE/URETEROSCOPY/ BIOPSY,BLADDER BIOPSY PLACEMENT STENT LEFT URETER;  Surgeon: Festus Aloe, MD;  Location: [facility] ORS;  Service: Urology;  Laterality: Left;  . DILATION AND CURETTAGE OF UTERUS    . ESOPHAGOGASTRODUODENOSCOPY N/A 12/16/2019   Procedure: ESOPHAGOGASTRODUODENOSCOPY (EGD);  Surgeon: Lesly Rubenstein, MD;  Location: [facility] ENDOSCOPY;  Service: Endoscopy;  Laterality: N/A;  . ESOPHAGOGASTRODUODENOSCOPY (EGD) WITH PROPOFOL N/A 01/09/2019   Procedure: ESOPHAGOGASTRODUODENOSCOPY (EGD) WITH PROPOFOL;  Surgeon: Toledo, Benay Pike, MD;  Location: [facility] ENDOSCOPY;  Service: Gastroenterology;  Laterality:  N/A;  . NASAL ENDOSCOPY WITH EPISTAXIS CONTROL    . ROBOTIC ASSITED PARTIAL NEPHRECTOMY Left 02/24/2017   Procedure: XI ROBOTIC ASSITED LEFT  RADICAL NEPHRECTOMY;  Surgeon: Alexis Frock, MD;  Location: [facility] ORS;  Service: Urology;  Laterality: Left;  . TUBAL LIGATION      Prior to Admission medications   Medication Sig Start Date End Date Taking? Authorizing Provider  ADVAIR DISKUS 500-50 MCG/DOSE AEPB TAKE 1  PUFF BY MOUTH TWICE A DAY 07/09/18   Wilhelmina Mcardle, MD  albuterol (VENTOLIN HFA) 108 (90 Base) MCG/ACT inhaler Inhale 2 puffs into the lungs every 4 (four) hours as needed.     [provider]  ALPRAZolam Duanne Moron) 0.25 MG tablet Take 0.25 mg by mouth at bedtime as needed for anxiety. Anxiety. 08/11/14   [provider]  aspirin EC 81 MG tablet Take 81 mg by mouth daily.    [provider]  atorvastatin (LIPITOR) 40 MG tablet TAKE 1 TABLET BY MOUTH EVERY DAY AT NIGHT 09/26/18   [provider]  Cholecalciferol (VITAMIN D3) 1000 units CAPS Take 1 capsule by mouth daily.    [provider]  fluticasone (FLONASE) 50 MCG/ACT nasal spray Place 2 sprays into both nostrils daily. 01/05/19   [provider]  Fluticasone-Umeclidin-Vilant (TRELEGY ELLIPTA) 200-62.5-25 MCG/INH AEPB Inhale 1 puff into the lungs daily. 07/30/19   Tyler Pita, MD  hydrALAZINE (APRESOLINE) 25 MG tablet Take 25 mg by mouth 2 (two) times daily. As needed for bp GREATER THAN 180-160/100    [provider]  hydrochlorothiazide (HYDRODIURIL) 12.5 MG tablet Take 12.5 mg by mouth daily.    [provider]  ipratropium-albuterol (DUONEB) 0.5-2.5 (3) MG/3ML SOLN Take 3 mLs by nebulization.    [provider]  levalbuterol Penne Lash HFA) 45 MCG/ACT inhaler  10/02/17   [provider]  levalbuterol Penne Lash) 0.63 MG/3ML nebulizer solution Take 3 mLs (0.63 mg total) by nebulization every 4 (four) hours as needed for wheezing or shortness of breath. Use as needed if Xopenex inhaler provides insufficient relief of shortness of breath, wheezing, chest tightness 03/21/18 03/21/19  Wilhelmina Mcardle, MD  Mepolizumab (NUCALA) 100 MG/ML SOAJ INJECT 1 PEN UNDER THE SKIN EVERY 28 DAYS 07/30/19   Tyler Pita, MD  montelukast (SINGULAIR) 10 MG tablet Take 10 mg by mouth daily. 07/10/14   [provider]  olmesartan (BENICAR) 20 MG tablet Take 20 mg by mouth  daily.    [provider]  pantoprazole (PROTONIX) 40 MG tablet Take 40 mg by mouth 2 (two) times daily.    [provider]  potassium chloride (MICRO-K) 10 MEQ CR capsule Take 20 mEq by mouth daily. 01/23/17   [provider]  ranitidine (ZANTAC) 75 MG tablet Take 75 mg by mouth 2 (two) times daily.    [provider]  triamterene-hydrochlorothiazide (DYAZIDE) 37.5-25 MG per capsule Take 1 capsule by mouth daily. 07/10/14   [provider]  trimethoprim (TRIMPEX) 100 MG tablet Take 1 tablet (100 mg total) by mouth daily. 10/29/18   Bjorn Loser, MD  zafirlukast (ACCOLATE) 20 MG tablet TAKE 1 TABLET BY MOUTH TWICE A DAY 01/27/20   Tyler Pita, MD    Allergies Benzoin, Ciprofloxacin, and Steri-strip compound benzoin [benzoin compound]  Family History  Problem Relation Age of Onset  . Hypertension Brother     Social History Social History   Tobacco Use  . Smoking status: Never Smoker  . Smokeless tobacco: Never Used  Vaping Use  . Vaping Use: Never used  Substance Use Topics  . Alcohol use: No  . Drug use: Never    Review of Systems  Review of Systems  Constitutional: Positive for fatigue. Negative for fever.  HENT: Negative for congestion and sore throat.   Eyes: Negative for visual disturbance.  Respiratory: Positive for chest tightness. Negative for cough and shortness of breath.   Cardiovascular: Positive for palpitations. Negative for chest pain.  Gastrointestinal: Negative for abdominal pain, diarrhea, nausea and vomiting.  Genitourinary: Negative for flank pain.  Musculoskeletal: Negative for back pain and neck pain.  Skin: Negative for rash and wound.  Neurological: Positive for weakness.  All other systems reviewed and are negative.    ____________________________________________  PHYSICAL EXAM:      VITAL SIGNS: ED Triage Vitals  Enc Vitals Group     BP      Pulse      Resp      Temp      Temp src       SpO2      Weight      Height      Head Circumference      Peak Flow      Pain Score      Pain Loc      Pain Edu?      Excl. in [place]?      Physical Exam Vitals and nursing note reviewed.  Constitutional:      General: She is not in acute distress.    Appearance: She is well-developed.  HENT:     Head: Normocephalic and atraumatic.  Eyes:     Conjunctiva/sclera: Conjunctivae normal.  Cardiovascular:     Rate and Rhythm: Tachycardia present. Rhythm irregularly irregular.     Heart sounds: Normal heart sounds. No murmur heard. No friction rub.  Pulmonary:     Effort: Pulmonary effort is normal. No respiratory distress.     Breath sounds: Normal breath sounds. No wheezing or rales.  Abdominal:     General: There is no distension.     Palpations: Abdomen is soft.     Tenderness: There is no abdominal tenderness.  Musculoskeletal:     Cervical back: Neck supple.  Skin:    General: Skin is warm.     Capillary Refill: Capillary refill takes less than 2 seconds.  Neurological:     Mental Status: She is alert and oriented to person, place, and time.     Motor: No abnormal muscle tone.       ____________________________________________   LABS (all labs ordered are listed, but only abnormal results are displayed)  Labs Reviewed  CBC - Abnormal; Notable for the following components:      Result Value   Hemoglobin 11.8 (*)    All other components within normal limits  COMPREHENSIVE METABOLIC PANEL - Abnormal; Notable for the following components:   Potassium 3.2 (*)    Glucose, Bld 112 (*)    BUN 31 (*)    Creatinine, Ser 1.31 (*)    GFR, Estimated 45 (*)    All other components within normal limits  TROPONIN I (HIGH SENSITIVITY) - Abnormal; Notable for the following components:   Troponin I (High Sensitivity) 31 (*)    All other components within normal limits  RESP PANEL BY RT-PCR (FLU A&B, COVID) ARPGX2  MAGNESIUM  TROPONIN I (HIGH SENSITIVITY)     ____________________________________________  EKG: Atrial fibrillation with rapid ventricular rate, ventricular rate 144.  QRS 91, QTc 497.  Nonspecific T wave changes, likely rate related, worse in the lateral precordial leads ________________________________________  RADIOLOGY All imaging, including plain films, CT scans, and ultrasounds, independently reviewed by me, and interpretations confirmed via formal radiology reads.  ED MD interpretation:   Chest x-ray: Bibasilar atelectasis  Official radiology report(s): DG Chest Portable 1 View  Result Date: 02/12/2020 CLINICAL DATA:  Chest pain EXAM: PORTABLE CHEST 1 VIEW COMPARISON:  01/11/2019 FINDINGS: The heart size and mediastinal contours are within normal limits. Bibasilar atelectasis. The visualized skeletal structures are unremarkable. IMPRESSION: Bibasilar atelectasis. Electronically Signed   By: Ulyses Jarred M.D.   On: 02/12/2020 03:33    ____________________________________________  PROCEDURES   Procedure(s) performed (including Critical Care):  .Critical Care Performed by: Duffy Bruce, MD Authorized by: Duffy Bruce, MD   Critical care provider statement:    Critical care time (minutes):  35   Critical care time was exclusive of:  Separately billable procedures and treating other patients and teaching time   Critical care was necessary to treat or prevent imminent or life-threatening deterioration of the following conditions:  Circulatory failure, cardiac failure and respiratory failure   Critical care was time spent personally by me on the following activities:  Development of treatment plan with patient or surrogate, discussions with consultants, evaluation of patient's response to treatment, examination of patient, obtaining history from patient or surrogate, ordering and performing treatments and interventions, ordering and review of laboratory studies, ordering and review of radiographic studies, pulse  oximetry, re-evaluation of patient's condition and review of old charts   I assumed direction of critical care for this patient from another provider in my specialty: no   .1-3 Lead EKG Interpretation Performed by: Duffy Bruce, MD Authorized by: Duffy Bruce, MD     Interpretation: abnormal     ECG rate:  100-150   ECG rate assessment: tachycardic     Rhythm: atrial fibrillation     Ectopy: none     Conduction: normal   Comments:     Indication: A. fib RVR    ____________________________________________  INITIAL IMPRESSION / MDM / ASSESSMENT AND PLAN / ED COURSE  As part of my medical decision making, I reviewed the following data within the [place] notes reviewed and incorporated, Old chart reviewed, Notes from prior ED visits, and [place] Controlled Substance Database       *Heidi Torres was evaluated in Emergency Department on 02/12/2020 for the symptoms described in the history of present illness. She was evaluated in the context of the global COVID-19 pandemic, which necessitated consideration that the patient might be at risk for infection with the SARS-CoV-2 virus that causes COVID-19. Institutional protocols and algorithms that pertain to the evaluation of patients at risk for COVID-19 are in a state of rapid change based on information released by regulatory bodies including the CDC and federal and state organizations. These policies and algorithms were followed during the patient's care in the ED.  Some ED evaluations and interventions may be delayed as a result of limited staffing during the pandemic.*     Medical Decision Making: 65 year old female here with palpitations and shortness of breath.  On arrival, patient noted to be in atrial fibrillation with rapid ventricular response.  EKG shows A. fib RVR with nonspecific ST changes.  No known ischemic heart disease.  Lab work shows mild hypokalemia which has been repleted.  White count normal.   Mag normal.  Covid negative.  Chest  x-ray clear.  Patient given multiple doses of IV diltiazem with persistent rapid rate.  Will place on drip and plan to admit.  ____________________________________________  FINAL CLINICAL IMPRESSION(S) / ED DIAGNOSES  Final diagnoses:  Paroxysmal atrial fibrillation (HCC)     MEDICATIONS GIVEN DURING THIS VISIT:  Medications  diltiazem (CARDIZEM) 125 mg in dextrose 5% 125 mL (1 mg/mL) infusion (5 mg/hr Intravenous New Bag/Given 02/12/20 0433)  potassium chloride 10 mEq in 100 mL IVPB (10 mEq Intravenous New Bag/Given 02/12/20 0434)  diltiazem (CARDIZEM) injection 10 mg (10 mg Intravenous Given 02/12/20 0329)  diltiazem (CARDIZEM) injection 10 mg (10 mg Intravenous Given 02/12/20 0337)  alum & mag hydroxide-simeth (MAALOX/MYLANTA) 200-200-20 MG/5ML suspension 30 mL (30 mLs Oral Given 02/12/20 0411)    And  lidocaine (XYLOCAINE) 2 % viscous mouth solution 15 mL (15 mLs Oral Given 02/12/20 0411)  diltiazem (CARDIZEM) injection 10 mg (10 mg Intravenous Given 02/12/20 0409)  potassium chloride SA (KLOR-CON) CR tablet 40 mEq (40 mEq Oral Given 02/12/20 0434)  alum & mag hydroxide-simeth (MAALOX/MYLANTA) 200-200-20 MG/5ML suspension 15 mL (15 mLs Oral Given 02/12/20 0438)     ED Discharge Orders    None       Note:  This document was prepared using Dragon voice recognition software and may include unintentional dictation errors.   Duffy Bruce, MD 02/12/20 [phone]

## 2020-02-12 NOTE — H&P (Signed)
[place]   PATIENT NAME: Heidi Torres    MR#:  [id number]  DATE OF BIRTH:  Heidi Torres 10, 1957  DATE OF ADMISSION:  02/12/2020  PRIMARY CARE PHYSICIAN: [name], MD   Patient is coming from: Home REQUESTING/REFERRING PHYSICIAN: Duffy Bruce, MD  CHIEF COMPLAINT:   Chief Complaint  Patient presents with  . Chest Pain    HISTORY OF PRESENT ILLNESS:  Heidi Torres is a 65 y.o. Caucasian female with medical history significant for asthma, GERD, atrial fibrillation, hypertension and dyslipidemia, who presented to the emergency room with acute onset of midsternal chest pain with associated palpitations without dyspnea or nausea or vomiting or diaphoresis.  She has been having mild cough and wheezing with a recent URI that is improving.  She has been vaccinated and had a booster for COVID-19.  She believes that she may have eaten something that irritated her GERD and that was the culprit for her atrial fibrillation.  No dysuria, oliguria or hematuria or flank pain.  No bleeding diathesis.  No fever or chills. ED Course: When she came to the ER heart rate was 127 with otherwise normal vital signs.  Labs revealed hypokalemia and a creatinine of 1.31 with BUN of 31 and magnesium was 2.  High-sensitivity troponin I was 31 and later 120 and CBC showed mild anemia.  TSH came back 2.96 and free T4 0.89.  Influenza antigens and COVID-19 PCR came back negative. EKG as reviewed by me : Showed atrial fibrillation with rapid ventricular response of 144 with LVH and prolonged QT interval of 497 MS QTC Imaging: Chest x-ray showed bibasilar atelectasis.  The patient was given 2 doses of GI cocktail and 3 doses of IV Cardizem 10 mg bolus followed by IV Cardizem drip as well as 10 mEq of IV potassium chloride and 40 mEq p.o. she will be admitted to a progressive unit bed for further evaluation and management. PAST MEDICAL HISTORY:   Past Medical History:  Diagnosis Date  . Asthma   .  Cataract cortical, senile   . Complication of anesthesia   . Dysrhythmia   . GERD (gastroesophageal reflux disease)   . Headache    occular HA  . Heart murmur   . History of hiatal hernia   . Hx of dysplastic nevus    multiple sites  . Hx of squamous cell carcinoma of skin 06/22/2015   R infrascapular, SCC in situ  . Hypercholesteremia   . Hypertension   . Irregular heart beat    pac's and pvc's  . Pneumonia    11-2016  . PONV (postoperative nausea and vomiting)   . Renal cell cancer, left ([place])     PAST SURGICAL HISTORY:   Past Surgical History:  Procedure Laterality Date  . ABDOMINAL HYSTERECTOMY    . ABLATION     prior to hysterectomy  . CYSTOSCOPY/RETROGRADE/URETEROSCOPY     01-20-17 Dr. Junious Silk  . CYSTOSCOPY/RETROGRADE/URETEROSCOPY Left 01/20/2017   Procedure: CYSTOSCOPY/RETROGRADE/URETEROSCOPY/ BIOPSY,BLADDER BIOPSY PLACEMENT STENT LEFT URETER;  Surgeon: Festus Aloe, MD;  Location: [facility];  Service: Urology;  Laterality: Left;  . DILATION AND CURETTAGE OF UTERUS    . ESOPHAGOGASTRODUODENOSCOPY N/A 12/16/2019   Procedure: ESOPHAGOGASTRODUODENOSCOPY (EGD);  Surgeon: Lesly Rubenstein, MD;  Location: [facility] ENDOSCOPY;  Service: Endoscopy;  Laterality: N/A;  . ESOPHAGOGASTRODUODENOSCOPY (EGD) WITH PROPOFOL N/A 01/09/2019   Procedure: ESOPHAGOGASTRODUODENOSCOPY (EGD) WITH PROPOFOL;  Surgeon: Toledo, Benay Pike, MD;  Location: [facility] ENDOSCOPY;  Service: Gastroenterology;  Laterality: N/A;  .  NASAL ENDOSCOPY WITH EPISTAXIS CONTROL    . ROBOTIC ASSITED PARTIAL NEPHRECTOMY Left 02/24/2017   Procedure: XI ROBOTIC ASSITED LEFT  RADICAL NEPHRECTOMY;  Surgeon: Alexis Frock, MD;  Location: [facility];  Service: Urology;  Laterality: Left;  . TUBAL LIGATION      SOCIAL HISTORY:   Social History   Tobacco Use  . Smoking status: Never Smoker  . Smokeless tobacco: Never Used  Substance Use Topics  . Alcohol use: No    FAMILY HISTORY:   Family History  Problem Relation Age  of Onset  . Hypertension Brother     DRUG ALLERGIES:   Allergies  Allergen Reactions  . Benzoin     Other reaction(s): Other (See Comments) Blisters  . Ciprofloxacin Other (See Comments)    Arms were numb  . Steri-Strip Compound Benzoin [Benzoin Compound] Other (See Comments)    Blisters    REVIEW OF SYSTEMS:   ROS As per history of present illness. All pertinent systems were reviewed above. Constitutional, HEENT, cardiovascular, respiratory, GI, GU, musculoskeletal, neuro, psychiatric, endocrine, integumentary and hematologic systems were reviewed and are otherwise negative/unremarkable except for positive findings mentioned above in the HPI.   MEDICATIONS AT HOME:   Prior to Admission medications   Medication Sig Start Date End Date Taking? Authorizing Provider  ADVAIR DISKUS 500-50 MCG/DOSE AEPB TAKE 1 PUFF BY MOUTH TWICE A DAY 07/09/18   Wilhelmina Mcardle, MD  albuterol (VENTOLIN HFA) 108 (90 Base) MCG/ACT inhaler Inhale 2 puffs into the lungs every 4 (four) hours as needed.     [provider]  ALPRAZolam Duanne Moron) 0.25 MG tablet Take 0.25 mg by mouth at bedtime as needed for anxiety. Anxiety. 08/11/14   [provider]  aspirin EC 81 MG tablet Take 81 mg by mouth daily.    [provider]  atorvastatin (LIPITOR) 40 MG tablet TAKE 1 TABLET BY MOUTH EVERY DAY AT NIGHT 09/26/18   [provider]  Cholecalciferol (VITAMIN D3) 1000 units CAPS Take 1 capsule by mouth daily.    [provider]  fluticasone (FLONASE) 50 MCG/ACT nasal spray Place 2 sprays into both nostrils daily. 01/05/19   [provider]  Fluticasone-Umeclidin-Vilant (TRELEGY ELLIPTA) 200-62.5-25 MCG/INH AEPB Inhale 1 puff into the lungs daily. 07/30/19   Tyler Pita, MD  hydrALAZINE (APRESOLINE) 25 MG tablet Take 25 mg by mouth 2 (two) times daily. As needed for bp GREATER THAN 180-160/100    [provider]  hydrochlorothiazide (HYDRODIURIL) 12.5 MG  tablet Take 12.5 mg by mouth daily.    [provider]  ipratropium-albuterol (DUONEB) 0.5-2.5 (3) MG/3ML SOLN Take 3 mLs by nebulization.    [provider]  levalbuterol Penne Lash HFA) 45 MCG/ACT inhaler  10/02/17   [provider]  levalbuterol Penne Lash) 0.63 MG/3ML nebulizer solution Take 3 mLs (0.63 mg total) by nebulization every 4 (four) hours as needed for wheezing or shortness of breath. Use as needed if Xopenex inhaler provides insufficient relief of shortness of breath, wheezing, chest tightness 03/21/18 03/21/19  Wilhelmina Mcardle, MD  Mepolizumab (NUCALA) 100 MG/ML SOAJ INJECT 1 PEN UNDER THE SKIN EVERY 28 DAYS 07/30/19   Tyler Pita, MD  montelukast (SINGULAIR) 10 MG tablet Take 10 mg by mouth daily. 07/10/14   [provider]  olmesartan (BENICAR) 20 MG tablet Take 20 mg by mouth daily.    [provider]  pantoprazole (PROTONIX) 40 MG tablet Take 40 mg by mouth 2 (two) times daily.  [provider]  potassium chloride (MICRO-K) 10 MEQ CR capsule Take 20 mEq by mouth daily. 01/23/17   [provider]  ranitidine (ZANTAC) 75 MG tablet Take 75 mg by mouth 2 (two) times daily.    [provider]  triamterene-hydrochlorothiazide (DYAZIDE) 37.5-25 MG per capsule Take 1 capsule by mouth daily. 07/10/14   [provider]  trimethoprim (TRIMPEX) 100 MG tablet Take 1 tablet (100 mg total) by mouth daily. 10/29/18   Bjorn Loser, MD  zafirlukast (ACCOLATE) 20 MG tablet TAKE 1 TABLET BY MOUTH TWICE A DAY 01/27/20   Tyler Pita, MD      VITAL SIGNS:  Blood pressure 97/78, pulse 87, temperature 98.1 F (36.7 C), temperature source Oral, resp. rate 16, height 5\' 6"  (1.676 m), weight 71.7 kg, SpO2 97 %.  PHYSICAL EXAMINATION:  Physical Exam  GENERAL:  65 y.o.-year-old anxious Caucasian female patient lying in the bed with no acute distress.  EYES: Pupils equal, round, reactive to light and accommodation.  No scleral icterus. Extraocular muscles intact.  HEENT: Head atraumatic, normocephalic. Oropharynx and nasopharynx clear.  NECK:  Supple, no jugular venous distention. No thyroid enlargement, no tenderness.  LUNGS: Normal breath sounds bilaterally, no wheezing, rales,rhonchi or crepitation. No use of accessory muscles of respiration.  CARDIOVASCULAR: Irregular irregular tachycardic rhythm at 120. S1, S2 normal. No murmurs, rubs, or gallops.  ABDOMEN: Soft, nondistended, nontender. Bowel sounds present. No organomegaly or mass.  EXTREMITIES: No pedal edema, cyanosis, or clubbing.  NEUROLOGIC: Cranial nerves II through XII are intact. Muscle strength 5/5 in all extremities. Sensation intact. Gait not checked.  PSYCHIATRIC: The patient is alert and oriented x 3.  Normal affect and good eye contact. SKIN: No obvious rash, lesion, or ulcer.   LABORATORY PANEL:   CBC Recent Labs  Lab 02/12/20 0321  WBC 9.6  HGB 11.8*  HCT 36.5  PLT 272   ------------------------------------------------------------------------------------------------------------------  Chemistries  Recent Labs  Lab 02/12/20 0321 02/12/20 0323  NA 138  --   K 3.2*  --   CL 102  --   CO2 25  --   GLUCOSE 112*  --   BUN 31*  --   CREATININE 1.31*  --   CALCIUM 9.8  --   MG  --  2.0  AST 25  --   ALT 29  --   ALKPHOS 61  --   BILITOT 0.5  --    ------------------------------------------------------------------------------------------------------------------  Cardiac Enzymes No results for input(s): TROPONINI in the last 168 hours. ------------------------------------------------------------------------------------------------------------------  RADIOLOGY:  DG Chest Portable 1 View  Result Date: 02/12/2020 CLINICAL DATA:  Chest pain EXAM: PORTABLE CHEST 1 VIEW COMPARISON:  01/11/2019 FINDINGS: The heart size and mediastinal contours are within normal limits. Bibasilar atelectasis. The visualized skeletal  structures are unremarkable. IMPRESSION: Bibasilar atelectasis. Electronically Signed   By: Ulyses Jarred M.D.   On: 02/12/2020 03:33      IMPRESSION AND PLAN:  Active Problems:   Atrial fibrillation with RVR (HCC)  1.  Paroxysmal atrial fibrillation with rapid ventricular response. -The patient will be admitted to a progressive unit bed. -We will continue her on IV Cardizem drip and titrate.  At this time the blood pressure will tolerate.   -IV amiodarone could be considered if her rate is not controlled on IV Cardizem and blood pressure is coming to borderline level. -We will obtain a 2D echo for further assessment. -Cardiology consult to be obtained. -I notified Dr. Clayborn Bigness about the patient. -She has  a CHA2DS2-VASc score of 3. -We will therefore start her on p.o. Eliquis.  2.  Hypokalemia. -Potassium will be replaced and magnesium level was normal.  3.  Mild asthma exacerbation. -At this time it is resolving. -She will be placed on as needed duo nebs on Xopenex and Atrovent for tachycardia. -We will continue her Flovent but hold her Trelegy.  4.  Essential hypertension. -We will continue ARB therapy as well as Dyazide and hydralazine.  DVT prophylaxis: Eliquis. Code Status: full code. Family Communication:  The plan of care was discussed in details with the patient who requested no family notification at this time. I answered all questions. The patient agreed to proceed with the above mentioned plan. Further management will depend upon hospital course. Disposition Plan: Back to previous home environment Consults called: Cardiology consult to Dr. Clayborn Bigness. All the records are reviewed and case discussed with ED provider.  Status is: Inpatient  Remains inpatient appropriate because:Ongoing active pain requiring inpatient pain management, Ongoing diagnostic testing needed not appropriate for outpatient work up, Unsafe d/c plan, IV treatments appropriate due to intensity of  illness or inability to take PO and Inpatient level of care appropriate due to severity of illness   Dispo: The patient is from: Home              Anticipated d/c is to: Home              Anticipated d/c date is: 2 days              Patient currently is not medically stable to d/c.   Difficult to place patient No   TOTAL TIME TAKING CARE OF THIS PATIENT: 55 minutes.    Christel Mormon M.D on 02/12/2020 at 5:48 AM  [facility]   From 7 PM-7 AM, contact night-coverage [email or web address]  CC: Primary care physician; [name], MD

## 2020-02-12 NOTE — ED Notes (Signed)
MD at bedside. 

## 2020-02-12 NOTE — ED Triage Notes (Signed)
Pt to triage via w/c with no distress noted; pt reports awoke at 2am with pain to left side neck/chest and arm; st hx of same with afib

## 2020-02-12 NOTE — Progress Notes (Addendum)
Patient seen and examined at the bedside.  She is tearful and frustrated because of recurrent atrial fibrillation.  She attributed recurrence of A. fib with RVR to severe GERD.  She said she had eaten bacon last night before going to bed and she was awakened a few hours later by severe chest pain/chest pressure/heartburn.  Pain was unrelieved by Gaviscon and Tums. Telemetry showed that she goes in and out of A. fib but heart rate is improving.  Continue current management.  She says she does not want to take any blood thinners long-term for stroke prophylaxis.  Start low-dose Lovenox for DVT prophylaxis.  Follow-up with cardiologist for further recommendations.

## 2020-02-12 NOTE — Consult Note (Signed)
Brief cardiology consult Full note to follow Patient presented with paroxysmal atrial fibrillation rapid ventricular response Hemodynamically stable Patient on Cardizem drip rate around 100 A. fib is persisted Doubt any significant ischemia or coronary disease Troponin unremarkable no evidence of failure Previous echo suggest normal left ventricular function In the past we have avoided long-term anticoagulation She has been treated with a pill in a pocket diltiazem in case she has episodes of A. Fib Plan today  We will probably start amiodarone IV to help assist in chemical cardioversion We will continue Cardizem IV until she converts My hope would be that after she converts we can discontinue amiodarone and Cardizem and may be treat her with just metoprolol and aspirin If she continues to have persistent episodes of A. fib recurrently then we may have to consider antiarrhythmic like amiodarone or sotalol and anticoagulation The hope would be that the patient can be discharged home today if she is able to convert Then we can have the patient follow-up in the office

## 2020-02-12 NOTE — ED Notes (Addendum)
Pt drank half of GI cocktail then stated she didn't like how it made her feel in her throat.  Pt sts she wants plain maalox. Dr Ellender Hose notified and order given.

## 2020-02-12 NOTE — ED Notes (Signed)
Pt ambulatory to the bathroom without difficulty; no c/o dizziness or SOB

## 2020-02-12 NOTE — ED Notes (Signed)
Critical troponin result given to MD Isaacs.

## 2020-02-12 NOTE — ED Notes (Signed)
ED Provider at bedside. 

## 2020-02-13 LAB — PROTIME-INR
INR: 1.2 (ref 0.8–1.2)
Prothrombin Time: 14.9 seconds (ref 11.4–15.2)

## 2020-02-13 LAB — BASIC METABOLIC PANEL
Anion gap: 9 (ref 5–15)
BUN: 18 mg/dL (ref 8–23)
CO2: 24 mmol/L (ref 22–32)
Calcium: 9.3 mg/dL (ref 8.9–10.3)
Chloride: 109 mmol/L (ref 98–111)
Creatinine, Ser: 1.1 mg/dL — ABNORMAL HIGH (ref 0.44–1.00)
GFR, Estimated: 56 mL/min — ABNORMAL LOW (ref >60)
Glucose, Bld: 92 mg/dL (ref 70–99)
Potassium: 4.1 mmol/L (ref 3.5–5.1)
Sodium: 142 mmol/L (ref 135–145)

## 2020-02-13 LAB — CBC
HCT: 33.3 % — ABNORMAL LOW (ref 36.0–46.0)
Hemoglobin: 10.7 g/dL — ABNORMAL LOW (ref 12.0–15.0)
MCH: 27.9 pg (ref 26.0–34.0)
MCHC: 32.1 g/dL (ref 30.0–36.0)
MCV: 86.7 fL (ref 80.0–100.0)
Platelets: 222 10*3/uL (ref 150–400)
RBC: 3.84 MIL/uL — ABNORMAL LOW (ref 3.87–5.11)
RDW: 13.9 % (ref 11.5–15.5)
WBC: 7.1 10*3/uL (ref 4.0–10.5)
nRBC: 0 % (ref 0.0–0.2)

## 2020-02-13 LAB — TROPONIN I (HIGH SENSITIVITY): Troponin I (High Sensitivity): 94 ng/L — ABNORMAL HIGH (ref <18)

## 2020-02-13 MED ORDER — AMIODARONE HCL 200 MG PO TABS: 200.0000 mg | ORAL_TABLET | Freq: Every day | ORAL | 1 refills | Status: DC

## 2020-02-13 MED ORDER — RANITIDINE HCL 75 MG PO TABS: 75.0000 mg | ORAL_TABLET | Freq: Two times a day (BID) | ORAL | Status: DC

## 2020-02-13 MED ORDER — AMIODARONE HCL 200 MG PO TABS
200.0000 mg | ORAL_TABLET | Freq: Every day | ORAL | Status: DC
  Administered 2020-02-13: 200 mg via ORAL
  Filled 2020-02-13: qty 1

## 2020-02-13 MED ORDER — APIXABAN 5 MG PO TABS: 5.0000 mg | ORAL_TABLET | Freq: Two times a day (BID) | ORAL | 1 refills | Status: DC

## 2020-02-13 MED ORDER — PANTOPRAZOLE SODIUM 40 MG PO TBEC: 40.0000 mg | DELAYED_RELEASE_TABLET | Freq: Two times a day (BID) | ORAL | Status: DC

## 2020-02-13 MED ORDER — MONTELUKAST SODIUM 10 MG PO TABS: 10.0000 mg | ORAL_TABLET | Freq: Every day | ORAL | Status: DC

## 2020-02-13 MED ORDER — DILTIAZEM HCL 30 MG PO TABS: 30.0000 mg | ORAL_TABLET | Freq: Four times a day (QID) | ORAL | 1 refills | Status: DC | PRN

## 2020-02-13 NOTE — Progress Notes (Signed)
[facility] Cardiology    SUBJECTIVE: States to feel reasonably well shortness of breath is improved palpitations are improved now that she is in sinus denies any vertigo or lightheadedness no nausea vomiting or diarrhea patient amenable short-term anticoagulation and amiodarone for now and feels well enough to be discharged home she states she is normal continue to follow restricted diet.   Vitals:   02/12/20 2300 02/13/20 0000 02/13/20 0300 02/13/20 0700  BP: 98/62   133/70  Pulse: (!) 56   (!) 53  Resp: 12 15  16   Temp:    97.9 F (36.6 C)  TempSrc:    Oral  SpO2: 96%   97%  Weight:   76.7 kg   Height:         Intake/Output Summary (Last 24 hours) at 02/13/2020 0817 Last data filed at 02/12/2020 0847 Gross per 24 hour  Intake 420 ml  Output --  Net 420 ml      PHYSICAL EXAM  General: Well developed, well nourished, in no acute distress HEENT:  Normocephalic and atramatic Neck:  No JVD.  Lungs: Clear bilaterally to auscultation and percussion. Heart: HRRR . Normal S1 and S2 without gallops or murmurs.  Abdomen: Bowel sounds are positive, abdomen soft and non-tender  Msk:  Back normal, normal gait. Normal strength and tone for age. Extremities: No clubbing, cyanosis or edema.   Neuro: Alert and oriented X 3. Psych:  Good affect, responds appropriately   LABS: Basic Metabolic Panel: Recent Labs    02/12/20 0321 02/12/20 0323 02/12/20 0543 02/13/20 0428  NA 138  --   --  142  K 3.2*  --  3.8 4.1  CL 102  --   --  109  CO2 25  --   --  24  GLUCOSE 112*  --   --  92  BUN 31*  --   --  18  CREATININE 1.31*  --   --  1.10*  CALCIUM 9.8  --   --  9.3  MG  --  2.0 2.0  --    Liver Function Tests: Recent Labs    02/12/20 0321  AST 25  ALT 29  ALKPHOS 61  BILITOT 0.5  PROT 7.6  ALBUMIN 4.4   No results for input(s): LIPASE, AMYLASE in the last 72 hours. CBC: Recent Labs    02/12/20 0321 02/13/20 0428  WBC 9.6 7.1  HGB 11.8* 10.7*  HCT 36.5 33.3*  MCV 86.7  86.7  PLT 272 222   Cardiac Enzymes: No results for input(s): CKTOTAL, CKMB, CKMBINDEX, TROPONINI in the last 72 hours. BNP: Invalid input(s): POCBNP D-Dimer: No results for input(s): DDIMER in the last 72 hours. Hemoglobin A1C: No results for input(s): HGBA1C in the last 72 hours. Fasting Lipid Panel: No results for input(s): CHOL, HDL, LDLCALC, TRIG, CHOLHDL, LDLDIRECT in the last 72 hours. Thyroid Function Tests: Recent Labs    02/12/20 0543  TSH 2.965   Anemia Panel: No results for input(s): VITAMINB12, FOLATE, FERRITIN, TIBC, IRON, RETICCTPCT in the last 72 hours.  DG Chest Portable 1 View  Result Date: 02/12/2020 CLINICAL DATA:  Chest pain EXAM: PORTABLE CHEST 1 VIEW COMPARISON:  01/11/2019 FINDINGS: The heart size and mediastinal contours are within normal limits. Bibasilar atelectasis. The visualized skeletal structures are unremarkable. IMPRESSION: Bibasilar atelectasis. Electronically Signed   By: Ulyses Jarred M.D.   On: 02/12/2020 03:33       TELEMETRY: Sinus bradycardia rate of about 60 nonspecific changes  ASSESSMENT  AND PLAN:  Active Problems:   Atrial fibrillation with RVR (HCC) Bradycardia Chest pain Shortness of breath Asthma Hyperlipidemia Hypertension GERD Hiatal hernia Sinus congestion . Plan Agree with admit to telemetry Patient has since converted to sinus rhythm after IV amiodarone Cardizem has been discontinued because of bradycardia Recommend continuing amiodarone at 200 mg a day We will keep Cardizem to as needed because of bradycardia No clear indication for permanent pacemaker Recommend anticoagulation with Eliquis long-term Continue Lipitor therapy for lipid management Okay to discontinue aspirin while the patient is on Eliquis Continue hypertension management with Benicar Dyazide and Cardizem as needed Okay to discharge home have the patient follow-up with cardiology 1 to 2 weeks  Discharge cardiac meds Amiodarone 200 mg  daily Dyazide once a day Olmesartan once a day Eliquis 5 mg twice a day Discontinue aspirin while on Eliquis Continue Lipitor daily Discontinue hydralazine   Yolonda Kida, MD 02/13/2020 8:17 AM

## 2020-02-13 NOTE — Hospital Course (Addendum)
65 year old female with past medical history of asthma, GERD, A. fib not on anticoagulation, hypertension, hyperlipidemia who presented to the ED around 3 AM on 02/12/2020 with midsternal chest pain, palpitations, mild cough and wheezing in the setting of recent URI that has been improving.  She is fully vaccinated and received booster for COVID-19.  Patient reports severe GERD and felt the chest pain may be related as she ate something that exacerbated it.  In the ED, patient was in A. fib with RVR heart rate in the 120s, otherwise stable vitals.  Labs notable for hypokalemia, creatinine 1.31, hs-troponin 31>> 120, CBC with mild anemia, normal TSH and free T4, negative COVID-19 PCR and influenza antigens.  EKG with A. fib RVR rate of 144 bpm, LVH and prolonged QTc of 497 ms.  Patient was treated with GI cocktail, IV Cardizem bolus followed by infusion, IV and oral potassium replacement.  Admitted to hospitalist service with [facility] cardiology consulted.  Cardizem was continued, amiodarone infusion started and patient later converted to normal sinus rhythm.  Cardizem infusion discontinued due to bradycardia with heart rate in the 50s.  Patient started on Eliquis for long-term anticoagulation.

## 2020-02-13 NOTE — Consult Note (Signed)
CARDIOLOGY CONSULT NOTE               Patient ID: Heidi Torres MRN: [id number] DOB/AGE: [birth year] 65 y.o.  Admit date: 02/12/2020 Referring Physician Dr. Garner Gavel hospitalist Primary Physician Dr. Netty Starring primary Primary Cardiologist [facility] Reason for Consultation atrial fibrillation paroxysmal rapid ventricular response  HPI: Patient history of paroxysmal atrial fibrillation on Cardizem history of asthma GERD hypertension hyperlipidemia patient states he ate some spicy food with high level of protein in the time to trigger for A. fib most recent episode was maybe 4 months ago [place] had episode requiring admission she eventually converted and was referred back to cardiology.  Patient states this episode started after a big meal with spicy bacon bits when she started having heart racing lightheaded weakness following came to the emergency room heart rate of 125 rapid atrial fibrillation.  Patient found to be hypokalemic troponins were negative TSH was unremarkable and she was Covid negative because of significant heart rate patient was placed on IV Cardizem heart rate was never to be controlled around 100-110 .cardiology was then consulted for recommended further evaluation  Review of systems complete and found to be negative unless listed above     Past Medical History:  Diagnosis Date  . Asthma   . Cataract cortical, senile   . Complication of anesthesia   . Dysrhythmia   . GERD (gastroesophageal reflux disease)   . Headache    occular HA  . Heart murmur   . History of hiatal hernia   . Hx of dysplastic nevus    multiple sites  . Hx of squamous cell carcinoma of skin 06/22/2015   R infrascapular, SCC in situ  . Hypercholesteremia   . Hypertension   . Irregular heart beat    pac's and pvc's  . Pneumonia    11-2016  . PONV (postoperative nausea and vomiting)   . Renal cell cancer, left [facility])     Past Surgical History:   Procedure Laterality Date  . ABDOMINAL HYSTERECTOMY    . ABLATION     prior to hysterectomy  . CYSTOSCOPY/RETROGRADE/URETEROSCOPY     01-20-17 Dr. Junious Silk  . CYSTOSCOPY/RETROGRADE/URETEROSCOPY Left 01/20/2017   Procedure: CYSTOSCOPY/RETROGRADE/URETEROSCOPY/ BIOPSY,BLADDER BIOPSY PLACEMENT STENT LEFT URETER;  Surgeon: Festus Aloe, MD;  Location: [facility] ORS;  Service: Urology;  Laterality: Left;  . DILATION AND CURETTAGE OF UTERUS    . ESOPHAGOGASTRODUODENOSCOPY N/A 12/16/2019   Procedure: ESOPHAGOGASTRODUODENOSCOPY (EGD);  Surgeon: Lesly Rubenstein, MD;  Location: [facility] ENDOSCOPY;  Service: Endoscopy;  Laterality: N/A;  . ESOPHAGOGASTRODUODENOSCOPY (EGD) WITH PROPOFOL N/A 01/09/2019   Procedure: ESOPHAGOGASTRODUODENOSCOPY (EGD) WITH PROPOFOL;  Surgeon: Toledo, Benay Pike, MD;  Location: [facility] ENDOSCOPY;  Service: Gastroenterology;  Laterality: N/A;  . NASAL ENDOSCOPY WITH EPISTAXIS CONTROL    . ROBOTIC ASSITED PARTIAL NEPHRECTOMY Left 02/24/2017   Procedure: XI ROBOTIC ASSITED LEFT  RADICAL NEPHRECTOMY;  Surgeon: Alexis Frock, MD;  Location: [facility] ORS;  Service: Urology;  Laterality: Left;  . TUBAL LIGATION      Medications Prior to Admission  Medication Sig Dispense Refill Last Dose  . ALPRAZolam (XANAX) 0.25 MG tablet Take 0.25 mg by mouth at bedtime as needed for anxiety. Anxiety.  5 02/11/2020 at 2000  . aspirin EC 81 MG tablet Take 81 mg by mouth daily.   02/11/2020 at 1000  . atorvastatin (LIPITOR) 40 MG tablet Take 40 mg by mouth daily.   02/11/2020 at 2000  . Cholecalciferol (  VITAMIN D3) 1000 units CAPS Take 1 capsule by mouth daily.   02/11/2020 at 1000  . diltiazem (CARDIZEM) 30 MG tablet Take 30 mg by mouth as needed.   02/12/2020 at 0200  . Mepolizumab (NUCALA) 100 MG/ML SOAJ INJECT 1 PEN UNDER THE SKIN EVERY 28 DAYS 1 mL 11 Past Month at Unknown time  . olmesartan (BENICAR) 20 MG tablet Take 20 mg by mouth daily.   02/11/2020 at 1000  . potassium chloride (MICRO-K) 10 MEQ CR capsule Take  20 mEq by mouth daily.  11 02/11/2020 at 1000  . RABEprazole (ACIPHEX) 20 MG tablet Take 20 mg by mouth daily.   02/12/2020 at 0200  . sucralfate (CARAFATE) 1 g tablet Take 1 g by mouth 2 (two) times daily.   02/12/2020 at 0200  . triamterene-hydrochlorothiazide (DYAZIDE) 37.5-25 MG per capsule Take 1 capsule by mouth daily.   02/11/2020 at 1000  . zafirlukast (ACCOLATE) 20 MG tablet TAKE 1 TABLET BY MOUTH TWICE A DAY (Patient taking differently: Take 20 mg by mouth 2 (two) times daily before a meal.) 180 tablet 1 02/11/2020 at 1000  . albuterol (VENTOLIN HFA) 108 (90 Base) MCG/ACT inhaler Inhale 2 puffs into the lungs every 4 (four) hours as needed.    unknown at prn  . FLOVENT HFA 110 MCG/ACT inhaler Inhale 2 puffs into the lungs 2 (two) times daily as needed.   unknown at prn  . fluticasone (FLONASE) 50 MCG/ACT nasal spray Place 2 sprays into both nostrils daily.   unknown at prn  . hydrALAZINE (APRESOLINE) 25 MG tablet Take 25 mg by mouth 2 (two) times daily. As needed for bp GREATER THAN 180-160/100   unknown at prn  . ipratropium-albuterol (DUONEB) 0.5-2.5 (3) MG/3ML SOLN Take 3 mLs by nebulization.   unknown at prn  . levalbuterol (XOPENEX) 0.63 MG/3ML nebulizer solution Take 3 mLs (0.63 mg total) by nebulization every 4 (four) hours as needed for wheezing or shortness of breath. Use as needed if Xopenex inhaler provides insufficient relief of shortness of breath, wheezing, chest tightness 30 mL 5    Social History   Socioeconomic History  . Marital status: Married    Spouse name: Not on file  . Number of children: Not on file  . Years of education: Not on file  . Highest education level: Not on file  Occupational History  . Not on file  Tobacco Use  . Smoking status: Never Smoker  . Smokeless tobacco: Never Used  Vaping Use  . Vaping Use: Never used  Substance and Sexual Activity  . Alcohol use: No  . Drug use: Never  . Sexual activity: Yes  Other Topics Concern  . Not on file  Social  History Narrative  . Not on file   Social Determinants of Health   Financial Resource Strain: Not on file  Food Insecurity: Not on file  Transportation Needs: Not on file  Physical Activity: Not on file  Stress: Not on file  Social Connections: Not on file  Intimate Partner Violence: Not on file    Family History  Problem Relation Age of Onset  . Hypertension Brother       Review of systems complete and found to be negative unless listed above      PHYSICAL EXAM  General: Well developed, well nourished, in no acute distress HEENT:  Normocephalic and atramatic Neck:  No JVD.  Lungs: Clear bilaterally to auscultation and percussion. Heart: HRRR . Normal S1 and S2 without gallops  or 2/6 sem murmurs.  Abdomen: Bowel sounds are positive, abdomen soft and non-tender  Msk:  Back normal, normal gait. Normal strength and tone for age. Extremities: No clubbing, cyanosis or edema.   Neuro: Alert and oriented X 3. Psych:  Good affect, responds appropriately  Labs:   Lab Results  Component Value Date   WBC 7.1 02/13/2020   HGB 10.7 (L) 02/13/2020   HCT 33.3 (L) 02/13/2020   MCV 86.7 02/13/2020   PLT 222 02/13/2020    Recent Labs  Lab 02/12/20 0321 02/12/20 0543 02/13/20 0428  NA 138  --  142  K 3.2*   < > 4.1  CL 102  --  109  CO2 25  --  24  BUN 31*  --  18  CREATININE 1.31*  --  1.10*  CALCIUM 9.8  --  9.3  PROT 7.6  --   --   BILITOT 0.5  --   --   ALKPHOS 61  --   --   ALT 29  --   --   AST 25  --   --   GLUCOSE 112*  --  92   < > = values in this interval not displayed.   Lab Results  Component Value Date   TROPONINI <0.03 10/14/2016   No results found for: CHOL No results found for: HDL No results found for: LDLCALC No results found for: TRIG No results found for: CHOLHDL No results found for: LDLDIRECT    Radiology: DG Chest Portable 1 View  Result Date: 02/12/2020 CLINICAL DATA:  Chest pain EXAM: PORTABLE CHEST 1 VIEW COMPARISON:  01/11/2019  FINDINGS: The heart size and mediastinal contours are within normal limits. Bibasilar atelectasis. The visualized skeletal structures are unremarkable. IMPRESSION: Bibasilar atelectasis. Electronically Signed   By: Ulyses Jarred M.D.   On: 02/12/2020 03:33    EKG: Atrial fibrillation rapid ventricular response nonspecific ST-T wave changes  ASSESSMENT AND PLAN:  Atrial fibrillation paroxysmal Tachycardia rapid ventricular response Chest pain Sick sinus syndrome occasional bradycardia Hyperlipidemia Hypertension GERD Asthma Murmur Acute on chronic renal insufficiency Hypokalemia Mild anemia . Plan Agree with admit to telemetry Recommend short-term anticoagulation with heparin Continue diltiazem drip for rate management Correct electrolytes Agree with Lipitor therapy for lipid management Continue hypertension management with Benicar Cardizem triamterene HCTZ Hydrate in the face of acute renal insufficiency If does not convert quickly loaded with IV amiodarone Consider long-term anticoagulation with Eliquis Once the patient converts will probably maintain on antiarrhythmic sotalol or amiodarone As well as low dose Eliquis therapy for anticoagulation Refer the patient to EP for consideration of possible ablation  Signed: Yolonda Kida MD 02/13/2020, 8:06 AM

## 2020-02-13 NOTE — Discharge Summary (Signed)
Physician Discharge Summary  Heidi Torres UKG:[id number] DOB: February 01, 1955 DOA: 02/12/2020  PCP: [name], MD  Admit date: 02/12/2020 Discharge date: 02/13/2020  Admitted From: home Disposition:  home  Recommendations for Outpatient Follow-up:  Follow up with PCP in 1-2 weeks Please obtain BMP/CBC in one week Please follow up with cardiology in 1-2 weeks  Home Health: No  Equipment/Devices: None   Discharge Condition: Stable  CODE STATUS: Full  Diet recommendation: Heart Healthy     Discharge Diagnoses: Active Problems:   Atrial fibrillation with RVR (HCC)    Summary of HPI and Hospital Course:  65 year old female with past medical history of asthma, GERD, A. fib not on anticoagulation, hypertension, hyperlipidemia who presented to the ED around 3 AM on 02/12/2020 with midsternal chest pain, palpitations, mild cough and wheezing in the setting of recent URI that has been improving.  She is fully vaccinated and received booster for COVID-19.  Patient reports severe GERD and felt the chest pain may be related as she ate something that exacerbated it.  In the ED, patient was in A. fib with RVR heart rate in the 120s, otherwise stable vitals.  Labs notable for hypokalemia, creatinine 1.31, hs-troponin 31>> 120, CBC with mild anemia, normal TSH and free T4, negative COVID-19 PCR and influenza antigens.  EKG with A. fib RVR rate of 144 bpm, LVH and prolonged QTc of 497 ms.  Patient was treated with GI cocktail, IV Cardizem bolus followed by infusion, IV and oral potassium replacement.  Admitted to hospitalist service with [facility] cardiology consulted.  Cardizem was continued, amiodarone infusion started and patient later converted to normal sinus rhythm.  Cardizem infusion discontinued due to bradycardia with heart rate in the 50s.  Patient started on Eliquis for long-term anticoagulation.    Paroxysmal atrial fibrillation with RVR -  Treated with Cardizem infusion with improvement in  HR's. Cardiology was consulted. CHA2DS2-VASc score of 3 - started on Eliquis for stroke prevention (ASA stopped).  Pt agreed, but initially hesitant regarding anticoagulation. Oral amiodarone started. As needed oral diltiazem to be used for HR's > 120.  Echocardiogram was deferred for outpatient follow up. Follow up with cardiology in 1-2 weeks. Hydralazine for HTN was discontinued due to soft BP's with rate controlling medications.   Hypokalemia - potassium replaced.  Outpatient lab follow up per PCP.   Mild asthma exacerbation - resolved with as needed bronchodilators.  Given Atrovent instead of Duonebs to avoid worsened tachycardia.  Continued Flovent PRN.   Essential hypertension - continue ARB and Dyazide.  Stopped hydralazine as above.  CKD stage IIIa - renal function at baseline. Monitor in follow up.    Discharge Instructions   Discharge Instructions     Amb referral to [facility]   Complete by: As directed    Call MD for:  extreme fatigue   Complete by: As directed    Call MD for:  persistant dizziness or light-headedness   Complete by: As directed    Call MD for:  persistant nausea and vomiting   Complete by: As directed    Call MD for:  severe uncontrolled pain   Complete by: As directed    Call MD for:  temperature >100.4   Complete by: As directed    Diet - low sodium heart healthy   Complete by: As directed    Discharge instructions   Complete by: As directed    Please follow up with cardiology in 1-2 weeks.  You have a couple of  new medications -  1) Eliquis (aka apixaban) is a blood thinner.   We use blood thinners with A-fib to prevent stroke, because A-fib increases risk of stroke . 2) Amiodarone - this is a medication to help control your heart rhythm, and hopefully keep you in normal sinus rhythm   Continue to use Cardizem (aka diltiazem) AS NEEDED - if your heart rate is staying high, above 120.   Increase activity slowly   Complete by: As  directed       Allergies as of 02/13/2020       Reactions   Benzoin    Other reaction(s): Other (See Comments) Blisters   Ciprofloxacin Other (See Comments)   Arms were numb   Steri-strip Compound Benzoin [benzoin Compound] Other (See Comments)   Blisters        Medication List     STOP taking these medications    aspirin EC 81 MG tablet   hydrALAZINE 25 MG tablet Commonly known as: APRESOLINE       TAKE these medications    albuterol 108 (90 Base) MCG/ACT inhaler Commonly known as: VENTOLIN HFA Inhale 2 puffs into the lungs every 4 (four) hours as needed.   ALPRAZolam 0.25 MG tablet Commonly known as: XANAX Take 0.25 mg by mouth at bedtime as needed for anxiety. Anxiety.   amiodarone 200 MG tablet Commonly known as: PACERONE Take 1 tablet (200 mg total) by mouth daily. Start taking on: February 14, 2020   apixaban 5 MG Tabs tablet Commonly known as: ELIQUIS Take 1 tablet (5 mg total) by mouth 2 (two) times daily.   atorvastatin 40 MG tablet Commonly known as: LIPITOR Take 40 mg by mouth daily.   diltiazem 30 MG tablet Commonly known as: CARDIZEM Take 1 tablet (30 mg total) by mouth 4 (four) times daily as needed (for heart rate over 120). What changed:  when to take this reasons to take this   Flovent HFA 110 MCG/ACT inhaler Generic drug: fluticasone Inhale 2 puffs into the lungs 2 (two) times daily as needed.   fluticasone 50 MCG/ACT nasal spray Commonly known as: FLONASE Place 2 sprays into both nostrils daily.   ipratropium-albuterol 0.5-2.5 (3) MG/3ML Soln Commonly known as: DUONEB Take 3 mLs by nebulization.   levalbuterol 0.63 MG/3ML nebulizer solution Commonly known as: XOPENEX Take 3 mLs (0.63 mg total) by nebulization every 4 (four) hours as needed for wheezing or shortness of breath. Use as needed if Xopenex inhaler provides insufficient relief of shortness of breath, wheezing, chest tightness   montelukast 10 MG tablet Commonly  known as: SINGULAIR Take 1 tablet (10 mg total) by mouth daily.   Nucala 100 MG/ML Soaj Generic drug: Mepolizumab INJECT 1 PEN UNDER THE SKIN EVERY 28 DAYS   olmesartan 20 MG tablet Commonly known as: BENICAR Take 20 mg by mouth daily.   pantoprazole 40 MG tablet Commonly known as: PROTONIX Take 1 tablet (40 mg total) by mouth 2 (two) times daily.   potassium chloride 10 MEQ CR capsule Commonly known as: MICRO-K Take 20 mEq by mouth daily.   RABEprazole 20 MG tablet Commonly known as: ACIPHEX Take 20 mg by mouth daily.   ranitidine 75 MG tablet Commonly known as: ZANTAC Take 1 tablet (75 mg total) by mouth 2 (two) times daily.   sucralfate 1 g tablet Commonly known as: CARAFATE Take 1 g by mouth 2 (two) times daily.   triamterene-hydrochlorothiazide 37.5-25 MG capsule Commonly known as: DYAZIDE Take 1 capsule by  mouth daily.   Vitamin D3 25 MCG (1000 UT) Caps Take 1 capsule by mouth daily.   zafirlukast 20 MG tablet Commonly known as: ACCOLATE TAKE 1 TABLET BY MOUTH TWICE A DAY What changed: when to take this        Follow-up Information     Yolonda Kida, MD On 03/10/2020.   Specialties: Cardiology, Internal Medicine Why: @ 9am Contact information: [address] [place] [address] [phone]                Allergies  Allergen Reactions   Benzoin     Other reaction(s): Other (See Comments) Blisters   Ciprofloxacin Other (See Comments)    Arms were numb   Steri-Strip Compound Benzoin [Benzoin Compound] Other (See Comments)    Blisters     If you experience worsening of your admission symptoms, develop shortness of breath, life threatening emergency, suicidal or homicidal thoughts you must seek medical attention immediately by calling 911 or calling your MD immediately  if symptoms less severe.    Please note   You were cared for by a hospitalist during your hospital stay. If you have any questions about your discharge  medications or the care you received while you were in the hospital after you are discharged, you can call the unit and asked to speak with the hospitalist on call if the hospitalist that took care of you is not available. Once you are discharged, your primary care physician will handle any further medical issues. Please note that NO REFILLS for any discharge medications will be authorized once you are discharged, as it is imperative that you return to your primary care physician (or establish a relationship with a primary care physician if you do not have one) for your aftercare needs so that they can reassess your need for medications and monitor your lab values.   Consultations: Cardiology   Procedures/Studies: DG Chest Portable 1 View  Result Date: 02/12/2020 CLINICAL DATA:  Chest pain EXAM: PORTABLE CHEST 1 VIEW COMPARISON:  01/11/2019 FINDINGS: The heart size and mediastinal contours are within normal limits. Bibasilar atelectasis. The visualized skeletal structures are unremarkable. IMPRESSION: Bibasilar atelectasis. Electronically Signed   By: Ulyses Jarred M.D.   On: 02/12/2020 03:33       Subjective: Pt feels well this AM.  Reports some intermittent gerd symptoms as is typical for her, no worsened CP or any a/w SOB/DOE.  We discussed use of PRN diltiazem which pt says she has done and expressses understanding.  Discharge Exam: Vitals:   02/13/20 1100 02/13/20 1107  BP: 133/73   Pulse: 63   Resp: 16 20  Temp: 97.9 F (36.6 C)   SpO2: 100%    Vitals:   02/13/20 0300 02/13/20 0700 02/13/20 1100 02/13/20 1107  BP:  133/70 133/73   Pulse:  (!) 53 63   Resp:  16 16 20   Temp:  97.9 F (36.6 C) 97.9 F (36.6 C)   TempSrc:  Oral Oral   SpO2:  97% 100%   Weight: 76.7 kg     Height:        General: Pt is alert, awake, not in acute distress Cardiovascular: RRR, S1/S2 +, no rubs, no gallops Respiratory: CTA bilaterally, no wheezing, no rhonchi Abdominal: Soft, NT, ND, bowel  sounds + Extremities: no edema, no cyanosis    The results of significant diagnostics from this hospitalization (including imaging, microbiology, ancillary and laboratory) are listed below for reference.  Microbiology: Recent Results (from the past 240 hour(s))  Resp Panel by RT-PCR (Flu A&B, Covid) Nasopharyngeal Swab     Status: None   Collection Time: 02/12/20  3:26 AM   Specimen: Nasopharyngeal Swab; Nasopharyngeal(NP) swabs in vial transport medium  Result Value Ref Range Status   SARS Coronavirus 2 by RT PCR NEGATIVE NEGATIVE Final    Comment: (NOTE) SARS-CoV-2 target nucleic acids are NOT DETECTED.  The SARS-CoV-2 RNA is generally detectable in upper respiratory specimens during the acute phase of infection. The lowest concentration of SARS-CoV-2 viral copies this assay can detect is 138 copies/mL. A negative result does not preclude SARS-Cov-2 infection and should not be used as the sole basis for treatment or other patient management decisions. A negative result may occur with  improper specimen collection/handling, submission of specimen other than nasopharyngeal swab, presence of viral mutation(s) within the areas targeted by this assay, and inadequate number of viral copies(<138 copies/mL). A negative result must be combined with clinical observations, patient history, and epidemiological information. The expected result is Negative.  Fact Sheet for Patients:  EntrepreneurPulse.com.au  Fact Sheet for Healthcare Providers:  IncredibleEmployment.be  This test is no t yet approved or cleared by the [place] FDA and  has been authorized for detection and/or diagnosis of SARS-CoV-2 by FDA under an Emergency Use Authorization (EUA). This EUA will remain  in effect (meaning this test can be used) for the duration of the COVID-19 declaration under Section 564(b)(1) of the Act, 21 U.S.C.section 360bbb-3(b)(1), unless the  authorization is terminated  or revoked sooner.       Influenza A by PCR NEGATIVE NEGATIVE Final   Influenza B by PCR NEGATIVE NEGATIVE Final    Comment: (NOTE) The Xpert Xpress SARS-CoV-2/FLU/RSV plus assay is intended as an aid in the diagnosis of influenza from Nasopharyngeal swab specimens and should not be used as a sole basis for treatment. Nasal washings and aspirates are unacceptable for Xpert Xpress SARS-CoV-2/FLU/RSV testing.  Fact Sheet for Patients: EntrepreneurPulse.com.au  Fact Sheet for Healthcare Providers: IncredibleEmployment.be  This test is not yet approved or cleared by the [place] FDA and has been authorized for detection and/or diagnosis of SARS-CoV-2 by FDA under an Emergency Use Authorization (EUA). This EUA will remain in effect (meaning this test can be used) for the duration of the COVID-19 declaration under Section 564(b)(1) of the Act, 21 U.S.C. section 360bbb-3(b)(1), unless the authorization is terminated or revoked.  Performed at [facility], [address] [place], [place] [address]      Labs: BNP (last 3 results) No results for input(s): BNP in the last 8760 hours. Basic Metabolic Panel: Recent Labs  Lab 02/12/20 0321 02/12/20 0323 02/12/20 0543 02/13/20 0428  NA 138  --   --  142  K 3.2*  --  3.8 4.1  CL 102  --   --  109  CO2 25  --   --  24  GLUCOSE 112*  --   --  92  BUN 31*  --   --  18  CREATININE 1.31*  --   --  1.10*  CALCIUM 9.8  --   --  9.3  MG  --  2.0 2.0  --    Liver Function Tests: Recent Labs  Lab 02/12/20 0321  AST 25  ALT 29  ALKPHOS 61  BILITOT 0.5  PROT 7.6  ALBUMIN 4.4   No results for input(s): LIPASE, AMYLASE in the last 168 hours. No results for  input(s): AMMONIA in the last 168 hours. CBC: Recent Labs  Lab 02/12/20 0321 02/13/20 0428  WBC 9.6 7.1  HGB 11.8* 10.7*  HCT 36.5 33.3*  MCV 86.7 86.7  PLT 272 222   Cardiac Enzymes: No  results for input(s): CKTOTAL, CKMB, CKMBINDEX, TROPONINI in the last 168 hours. BNP: Invalid input(s): POCBNP CBG: No results for input(s): GLUCAP in the last 168 hours. D-Dimer No results for input(s): DDIMER in the last 72 hours. Hgb A1c No results for input(s): HGBA1C in the last 72 hours. Lipid Profile No results for input(s): CHOL, HDL, LDLCALC, TRIG, CHOLHDL, LDLDIRECT in the last 72 hours. Thyroid function studies Recent Labs    02/12/20 0543  TSH 2.965   Anemia work up No results for input(s): VITAMINB12, FOLATE, FERRITIN, TIBC, IRON, RETICCTPCT in the last 72 hours. Urinalysis    Component Value Date/Time   COLORURINE COLORLESS (A) 06/27/2017 1620   APPEARANCEUR Clear 12/10/2018 1427   LABSPEC 1.002 (L) 06/27/2017 1620   PHURINE 7.0 06/27/2017 1620   GLUCOSEU Negative 12/10/2018 1427   HGBUR NEGATIVE 06/27/2017 1620   BILIRUBINUR Negative 12/10/2018 1427   KETONESUR NEGATIVE 06/27/2017 1620   PROTEINUR Negative 12/10/2018 1427   PROTEINUR NEGATIVE 06/27/2017 1620   NITRITE Negative 12/10/2018 1427   NITRITE NEGATIVE 06/27/2017 1620   LEUKOCYTESUR Negative 12/10/2018 1427   Sepsis Labs Invalid input(s): PROCALCITONIN,  WBC,  LACTICIDVEN Microbiology Recent Results (from the past 240 hour(s))  Resp Panel by RT-PCR (Flu A&B, Covid) Nasopharyngeal Swab     Status: None   Collection Time: 02/12/20  3:26 AM   Specimen: Nasopharyngeal Swab; Nasopharyngeal(NP) swabs in vial transport medium  Result Value Ref Range Status   SARS Coronavirus 2 by RT PCR NEGATIVE NEGATIVE Final    Comment: (NOTE) SARS-CoV-2 target nucleic acids are NOT DETECTED.  The SARS-CoV-2 RNA is generally detectable in upper respiratory specimens during the acute phase of infection. The lowest concentration of SARS-CoV-2 viral copies this assay can detect is 138 copies/mL. A negative result does not preclude SARS-Cov-2 infection and should not be used as the sole basis for treatment or other  patient management decisions. A negative result may occur with  improper specimen collection/handling, submission of specimen other than nasopharyngeal swab, presence of viral mutation(s) within the areas targeted by this assay, and inadequate number of viral copies(<138 copies/mL). A negative result must be combined with clinical observations, patient history, and epidemiological information. The expected result is Negative.  Fact Sheet for Patients:  EntrepreneurPulse.com.au  Fact Sheet for Healthcare Providers:  IncredibleEmployment.be  This test is no t yet approved or cleared by the [place] FDA and  has been authorized for detection and/or diagnosis of SARS-CoV-2 by FDA under an Emergency Use Authorization (EUA). This EUA will remain  in effect (meaning this test can be used) for the duration of the COVID-19 declaration under Section 564(b)(1) of the Act, 21 U.S.C.section 360bbb-3(b)(1), unless the authorization is terminated  or revoked sooner.       Influenza A by PCR NEGATIVE NEGATIVE Final   Influenza B by PCR NEGATIVE NEGATIVE Final    Comment: (NOTE) The Xpert Xpress SARS-CoV-2/FLU/RSV plus assay is intended as an aid in the diagnosis of influenza from Nasopharyngeal swab specimens and should not be used as a sole basis for treatment. Nasal washings and aspirates are unacceptable for Xpert Xpress SARS-CoV-2/FLU/RSV testing.  Fact Sheet for Patients: EntrepreneurPulse.com.au  Fact Sheet for Healthcare Providers: IncredibleEmployment.be  This test is not yet approved or cleared  by the [place] and has been authorized for detection and/or diagnosis of SARS-CoV-2 by FDA under an Emergency Use Authorization (EUA). This EUA will remain in effect (meaning this test can be used) for the duration of the COVID-19 declaration under Section 564(b)(1) of the Act, 21 U.S.C. section  360bbb-3(b)(1), unless the authorization is terminated or revoked.  Performed at [facility], [address] [place], [place] [address]      Time coordinating discharge: Over 30 minutes  SIGNED:   Ezekiel Slocumb, DO [facility] 02/13/2020, 1:35 PM   If 7PM-7AM, please contact night-coverage [email or web address]

## 2020-02-16 ENCOUNTER — Other Ambulatory Visit: Payer: Self-pay

## 2020-02-16 ENCOUNTER — Emergency Department
Admission: EM | Admit: 2020-02-16 | Discharge: 2020-02-16 | Disposition: A | Discharge status: Home / Self Care | Payer: Medicare Other | Attending: Emergency Medicine | Admitting: Emergency Medicine

## 2020-02-16 ENCOUNTER — Encounter: Payer: Self-pay | Admitting: Emergency Medicine

## 2020-02-16 DIAGNOSIS — I129 Hypertensive chronic kidney disease with stage 1 through stage 4 chronic kidney disease, or unspecified chronic kidney disease: Secondary | ICD-10-CM | POA: Insufficient documentation

## 2020-02-16 DIAGNOSIS — Z7951 Long term (current) use of inhaled steroids: Secondary | ICD-10-CM | POA: Diagnosis not present

## 2020-02-16 DIAGNOSIS — J45909 Unspecified asthma, uncomplicated: Secondary | ICD-10-CM | POA: Insufficient documentation

## 2020-02-16 DIAGNOSIS — Z79899 Other long term (current) drug therapy: Secondary | ICD-10-CM | POA: Diagnosis not present

## 2020-02-16 DIAGNOSIS — Z85828 Personal history of other malignant neoplasm of skin: Secondary | ICD-10-CM | POA: Diagnosis not present

## 2020-02-16 DIAGNOSIS — Z85528 Personal history of other malignant neoplasm of kidney: Secondary | ICD-10-CM | POA: Diagnosis not present

## 2020-02-16 DIAGNOSIS — R030 Elevated blood-pressure reading, without diagnosis of hypertension: Secondary | ICD-10-CM | POA: Diagnosis present

## 2020-02-16 DIAGNOSIS — Z7901 Long term (current) use of anticoagulants: Secondary | ICD-10-CM | POA: Diagnosis not present

## 2020-02-16 DIAGNOSIS — N183 Chronic kidney disease, stage 3 unspecified: Secondary | ICD-10-CM | POA: Diagnosis not present

## 2020-02-16 DIAGNOSIS — R519 Headache, unspecified: Secondary | ICD-10-CM

## 2020-02-16 LAB — CBC WITH DIFFERENTIAL/PLATELET
Abs Immature Granulocytes: 0.01 10*3/uL (ref 0.00–0.07)
Basophils Absolute: 0 10*3/uL (ref 0.0–0.1)
Basophils Relative: 1 %
Eosinophils Absolute: 0.1 10*3/uL (ref 0.0–0.5)
Eosinophils Relative: 1 %
HCT: 35.8 % — ABNORMAL LOW (ref 36.0–46.0)
Hemoglobin: 11.5 g/dL — ABNORMAL LOW (ref 12.0–15.0)
Immature Granulocytes: 0 %
Lymphocytes Relative: 25 %
Lymphs Abs: 1.8 10*3/uL (ref 0.7–4.0)
MCH: 27.5 pg (ref 26.0–34.0)
MCHC: 32.1 g/dL (ref 30.0–36.0)
MCV: 85.6 fL (ref 80.0–100.0)
Monocytes Absolute: 0.5 10*3/uL (ref 0.1–1.0)
Monocytes Relative: 7 %
Neutro Abs: 5 10*3/uL (ref 1.7–7.7)
Neutrophils Relative %: 66 %
Platelets: 261 10*3/uL (ref 150–400)
RBC: 4.18 MIL/uL (ref 3.87–5.11)
RDW: 13.7 % (ref 11.5–15.5)
WBC: 7.4 10*3/uL (ref 4.0–10.5)
nRBC: 0 % (ref 0.0–0.2)

## 2020-02-16 LAB — BASIC METABOLIC PANEL
Anion gap: 10 (ref 5–15)
BUN: 14 mg/dL (ref 8–23)
CO2: 26 mmol/L (ref 22–32)
Calcium: 9.7 mg/dL (ref 8.9–10.3)
Chloride: 103 mmol/L (ref 98–111)
Creatinine, Ser: 1.54 mg/dL — ABNORMAL HIGH (ref 0.44–1.00)
GFR, Estimated: 37 mL/min — ABNORMAL LOW (ref >60)
Glucose, Bld: 93 mg/dL (ref 70–99)
Potassium: 3.8 mmol/L (ref 3.5–5.1)
Sodium: 139 mmol/L (ref 135–145)

## 2020-02-16 LAB — TROPONIN I (HIGH SENSITIVITY): Troponin I (High Sensitivity): 24 ng/L — ABNORMAL HIGH (ref <18)

## 2020-02-16 NOTE — ED Triage Notes (Signed)
Pt to ED via POV with c/o HTN, pt states recently D/C for A-fib. Pt states was has been taking Amiodarone for A-fib, states was prescribed hydralazine PRN for BP, and has regularly scheduled BP meds. Pt states was sent due to increased BP. Pt states hx of kidney removal due to kidney cancer.

## 2020-02-16 NOTE — ED Provider Notes (Addendum)
[facility] Emergency Department Provider Note  ____________________________________________   None    (approximate)  I have reviewed the triage vital signs and the nursing notes.   HISTORY  Chief Complaint Headache  HPI Heidi Torres is a 65 y.o. female history of left nephrectomy secondary to renal carcinoma, hypertension, GERD, asthma, hypertension, and hyperlipidemia, presents herself to the ED for evaluation of elevated blood pressure readings since yesterday.  She is also reporting now resolved headache.  She denies any head injury, loss of consciousness, or weakness.  She also denies any chest pain or shortness of breath.  She was discharged last week after admission for a few.  She discharged on Eliquis, and amiodarone, and diltiazem for elevated HR. She was to discontinue the hydralazazine.  She reports that she did not start Eliquis, as it was not approved by her insurance company.  According to the patient she wanted to take the medication for about 4 weeks.  She presents today over some elevated BP readings this morning, with systolic readings in the low 948N and diastolic readings in the low 90s.  She did restart her hydralazine secondary to elevated blood pressure, has had 4 doses over the last 24 hours.  She reports improvement of pain swelling pain currently only 2 out of 10.  Patient is also reassured by her downtrending blood pressure.     Past Medical History:  Diagnosis Date  . Asthma   . Cataract cortical, senile   . Complication of anesthesia   . Dysrhythmia   . GERD (gastroesophageal reflux disease)   . Headache    occular HA  . Heart murmur   . History of hiatal hernia   . Hx of dysplastic nevus    multiple sites  . Hx of squamous cell carcinoma of skin 06/22/2015   R infrascapular, SCC in situ  . Hypercholesteremia   . Hypertension   . Irregular heart beat    pac's and pvc's  . Pneumonia    11-2016  . PONV (postoperative  nausea and vomiting)   . Renal cell cancer, left [facility])     Patient Active Problem List   Diagnosis Date Noted  . Atrial fibrillation with RVR ([place]) 02/12/2020  . Mixed hyperlipidemia 01/28/2019  . Stage 3 chronic kidney disease ([place]) 09/25/2018  . Moderate persistent asthma 09/05/2018  . Eosinophilia 09/05/2018  . Essential hypertension 07/27/2018  . Perennial allergic rhinitis with seasonal variation 07/27/2018  . Pure hypercholesterolemia 07/27/2018  . Chemotherapy induced nausea and vomiting 07/01/2017  . Chemotherapy induced diarrhea 07/01/2017  . Acute renal insufficiency 07/01/2017  . Thyroid dysfunction 07/01/2017  . Renal cell carcinoma of left kidney ([place]) 03/21/2017  . Cancer of left kidney ([place]) 02/24/2017  . Esophagitis, reflux 10/21/2013    Past Surgical History:  Procedure Laterality Date  . ABDOMINAL HYSTERECTOMY    . ABLATION     prior to hysterectomy  . CYSTOSCOPY/RETROGRADE/URETEROSCOPY     01-20-17 Dr. Junious Silk  . CYSTOSCOPY/RETROGRADE/URETEROSCOPY Left 01/20/2017   Procedure: CYSTOSCOPY/RETROGRADE/URETEROSCOPY/ BIOPSY,BLADDER BIOPSY PLACEMENT STENT LEFT URETER;  Surgeon: Festus Aloe, MD;  Location: [facility] ORS;  Service: Urology;  Laterality: Left;  . DILATION AND CURETTAGE OF UTERUS    . ESOPHAGOGASTRODUODENOSCOPY N/A 12/16/2019   Procedure: ESOPHAGOGASTRODUODENOSCOPY (EGD);  Surgeon: Lesly Rubenstein, MD;  Location[facility] ENDOSCOPY;  Service: Endoscopy;  Laterality: N/A;  . ESOPHAGOGASTRODUODENOSCOPY (EGD) WITH PROPOFOL N/A 01/09/2019   Procedure: ESOPHAGOGASTRODUODENOSCOPY (EGD) WITH PROPOFOL;  Surgeon: Alice Reichert, Benay Pike, MD;  Location: [facility]  ENDOSCOPY;  Service: Gastroenterology;  Laterality: N/A;  . NASAL ENDOSCOPY WITH EPISTAXIS CONTROL    . ROBOTIC ASSITED PARTIAL NEPHRECTOMY Left 02/24/2017   Procedure: XI ROBOTIC ASSITED LEFT  RADICAL NEPHRECTOMY;  Surgeon: Alexis Frock, MD;  Location: [facility] ORS;  Service: Urology;  Laterality: Left;  . TUBAL LIGATION       Prior to Admission medications   Medication Sig Start Date End Date Taking? Authorizing Provider  albuterol (VENTOLIN HFA) 108 (90 Base) MCG/ACT inhaler Inhale 2 puffs into the lungs every 4 (four) hours as needed.     [provider]  ALPRAZolam Duanne Moron) 0.25 MG tablet Take 0.25 mg by mouth at bedtime as needed for anxiety. Anxiety. 08/11/14   [provider]  amiodarone (PACERONE) 200 MG tablet Take 1 tablet (200 mg total) by mouth daily. 02/14/20   Ezekiel Slocumb, DO  apixaban (ELIQUIS) 5 MG TABS tablet Take 1 tablet (5 mg total) by mouth 2 (two) times daily. 02/13/20   Ezekiel Slocumb, DO  atorvastatin (LIPITOR) 40 MG tablet Take 40 mg by mouth daily. 09/26/18   [provider]  Cholecalciferol (VITAMIN D3) 1000 units CAPS Take 1 capsule by mouth daily.    [provider]  diltiazem (CARDIZEM) 30 MG tablet Take 1 tablet (30 mg total) by mouth 4 (four) times daily as needed (for heart rate over 120). 02/13/20 02/12/21  Ezekiel Slocumb, DO  FLOVENT HFA 110 MCG/ACT inhaler Inhale 2 puffs into the lungs 2 (two) times daily as needed. 10/28/19   [provider]  fluticasone (FLONASE) 50 MCG/ACT nasal spray Place 2 sprays into both nostrils daily. 01/05/19   [provider]  ipratropium-albuterol (DUONEB) 0.5-2.5 (3) MG/3ML SOLN Take 3 mLs by nebulization.    [provider]  levalbuterol Penne Lash) 0.63 MG/3ML nebulizer solution Take 3 mLs (0.63 mg total) by nebulization every 4 (four) hours as needed for wheezing or shortness of breath. Use as needed if Xopenex inhaler provides insufficient relief of shortness of breath, wheezing, chest tightness 03/21/18 03/21/19  Wilhelmina Mcardle, MD  Mepolizumab (NUCALA) 100 MG/ML SOAJ INJECT 1 PEN UNDER THE SKIN EVERY 28 DAYS 07/30/19   Tyler Pita, MD  montelukast (SINGULAIR) 10 MG tablet Take 1 tablet (10 mg total) by mouth daily. 02/13/20   Ezekiel Slocumb, DO  olmesartan (BENICAR) 20 MG  tablet Take 20 mg by mouth daily.    [provider]  pantoprazole (PROTONIX) 40 MG tablet Take 1 tablet (40 mg total) by mouth 2 (two) times daily. 02/13/20   Nicole Kindred A, DO  potassium chloride (MICRO-K) 10 MEQ CR capsule Take 20 mEq by mouth daily. 01/23/17   [provider]  RABEprazole (ACIPHEX) 20 MG tablet Take 20 mg by mouth daily. 01/16/20   [provider]  ranitidine (ZANTAC) 75 MG tablet Take 1 tablet (75 mg total) by mouth 2 (two) times daily. 02/13/20   Ezekiel Slocumb, DO  sucralfate (CARAFATE) 1 g tablet Take 1 g by mouth 2 (two) times daily. 11/24/19   [provider]  triamterene-hydrochlorothiazide (DYAZIDE) 37.5-25 MG per capsule Take 1 capsule by mouth daily. 07/10/14   [provider]  zafirlukast (ACCOLATE) 20 MG tablet TAKE 1 TABLET BY MOUTH TWICE A DAY Patient taking differently: Take 20 mg by mouth 2 (two) times daily before a meal. 01/27/20   Tyler Pita, MD    Allergies Benzoin, Ciprofloxacin, and Steri-strip compound benzoin [benzoin compound]  Family History  Problem Relation  Age of Onset  . Hypertension Brother     Social History Social History   Tobacco Use  . Smoking status: Never Smoker  . Smokeless tobacco: Never Used  Vaping Use  . Vaping Use: Never used  Substance Use Topics  . Alcohol use: No  . Drug use: Never    Review of Systems Constitutional: No fever/chills Eyes: No visual changes. ENT: No sore throat. Cardiovascular: Denies chest pain. Respiratory: Denies shortness of breath. Gastrointestinal: No abdominal pain.  No nausea, no vomiting.  No diarrhea.  No constipation. Genitourinary: Negative for dysuria. Musculoskeletal: Negative for back pain. Skin: Negative for rash. Neurological: Negative for focal weakness or numbness. Reports mild headache. ____________________________________________   PHYSICAL EXAM:  VITAL SIGNS: ED Triage Vitals [02/16/20 1418]  Enc Vitals Group      BP (!) 180/93     Pulse Rate 70     Resp 20     Temp 98.5 F (36.9 C)     Temp Source Oral     SpO2 100 %     Weight 169 lb 1.6 oz (76.7 kg)     Height 5\' 6"  (1.676 m)     Head Circumference      Peak Flow      Pain Score 3     Pain Loc      Pain Edu?      Excl. in [place]?    Constitutional: Alert and oriented. Well appearing and in no acute distress. Eyes: Conjunctivae are normal. PERRL. EOMI. Head: Atraumatic. Nose: No congestion/rhinnorhea. Mouth/Throat: Mucous membranes are moist.  Oropharynx non-erythematous. Neck: No stridor.   Cardiovascular: Normal rate, regular rhythm. Grossly normal heart sounds.  Good peripheral circulation. Respiratory: Normal respiratory effort.  No retractions. Lungs CTAB. Gastrointestinal: Soft and nontender. No distention. No abdominal bruits. No CVA tenderness. Musculoskeletal: No lower extremity tenderness nor edema.  No joint effusions. Neurologic:  Normal speech and language. No gross focal neurologic deficits are appreciated. No gait instability. Skin:  Skin is warm, dry and intact. No rash noted. Psychiatric: Mood and affect are normal. Speech and behavior are normal.  ____________________________________________   LABS (all labs ordered are listed, but only abnormal results are displayed)  Labs Reviewed  CBC WITH DIFFERENTIAL/PLATELET - Abnormal; Notable for the following components:      Result Value   Hemoglobin 11.5 (*)    HCT 35.8 (*)    All other components within normal limits  BASIC METABOLIC PANEL - Abnormal; Notable for the following components:   Creatinine, Ser 1.54 (*)    GFR, Estimated 37 (*)    All other components within normal limits  TROPONIN I (HIGH SENSITIVITY) - Abnormal; Notable for the following components:   Troponin I (High Sensitivity) 24 (*)    All other components within normal limits  TROPONIN I (HIGH SENSITIVITY)   ____________________________________________  EKG  Vent. rate 61 BPM PR  interval 122 ms QRS duration 84 ms QT/QTc 434/436 ms P-R-T axes 45 -39 54 No STEMI ____________________________________________  RADIOLOGY I, Melvenia Needles, personally viewed and evaluated these images (plain radiographs) as part of my medical decision making, as well as reviewing the written report by the radiologist.  ED MD interpretation:  N/A  Official radiology report(s): No results found.  ____________________________________________   PROCEDURES  Procedure(s) performed (including Critical Care):  Procedures  ____________________________________________   INITIAL IMPRESSION / ASSESSMENT AND PLAN / ED COURSE  As part of my medical decision making, I reviewed the following data within  the electronic MEDICAL RECORD NUMBER Labs reviewed improving troponin, EKG interpreted NSR, Old EKG reviewed, Notes from prior ED visits and [place] Controlled Substance Database    Patient with ED evaluation of elevated blood pressure readings at home.  Patient was concerned and presented with a diagnosis of admission.  She has significant symptomatic headaches without visual disturbance.  Exam is overall normal and reassuring at this time.  Labs are reassuring including a downtrending troponin improved from 94>>>24, 3 days prior.  Patient is reporting improved headache, and is reassured by her BP is WNL at this time.  She will be discharged in stable condition, with restrictions to follow-up with primary provider.  She will continue with the recently prescribed blood pressure medicines.    ____________________________________________   FINAL CLINICAL IMPRESSION(S) / ED DIAGNOSES  Final diagnoses:  Elevated blood pressure reading  Acute nonintractable headache, unspecified headache type     ED Discharge Orders    None      *Please note:  Tenisha Fleece Orebaugh was evaluated in Emergency Department on 02/16/2020 for the symptoms described in the history of present illness. She was evaluated  in the context of the global COVID-19 pandemic, which necessitated consideration that the patient might be at risk for infection with the SARS-CoV-2 virus that causes COVID-19. Institutional protocols and algorithms that pertain to the evaluation of patients at risk for COVID-19 are in a state of rapid change based on information released by regulatory bodies including the CDC and federal and state organizations. These policies and algorithms were followed during the patient's care in the ED.  Some ED evaluations and interventions may be delayed as a result of limited staffing during and the pandemic.*   Note:  This document was prepared using Dragon voice recognition software and may include unintentional dictation errors.    Melvenia Needles, PA-C 02/16/20 2034    Melvenia Needles, PA-C 02/16/20 2036    Harvest Dark, MD 02/16/20 2234

## 2020-02-16 NOTE — Discharge Instructions (Signed)
Your exam, labs, and EKG are all normal and reassuring at this time.  You have a downtrending troponin which is also reassuring.  You should continue with your home blood pressure medicines, and follow-up with your primary provider as scheduled.  Return to the ED if needed.

## 2020-02-17 ENCOUNTER — Ambulatory Visit: Payer: Commercial Managed Care - PPO | Admitting: Dermatology

## 2020-02-18 ENCOUNTER — Telehealth: Payer: Self-pay | Admitting: Internal Medicine

## 2020-02-18 NOTE — Telephone Encounter (Signed)
Hi Dr. Carlean Purl,  The patient is requesting a transfer of care. Is currently being seen in [place] for chronic GERD which she states causes her to have AFIB.    Please advise on scheduling.   Thank you

## 2020-02-19 NOTE — Telephone Encounter (Signed)
I am happy to see her.  She could also see an app and be assigned to me if she does not want to wait for my next available new patient appointment

## 2020-02-20 ENCOUNTER — Encounter: Payer: Self-pay | Admitting: Gastroenterology

## 2020-02-26 ENCOUNTER — Ambulatory Visit: Payer: Medicare Other

## 2020-02-26 ENCOUNTER — Other Ambulatory Visit: Payer: Commercial Managed Care - PPO

## 2020-02-27 ENCOUNTER — Ambulatory Visit: Payer: Medicare Other | Admitting: Gastroenterology

## 2020-03-03 ENCOUNTER — Telehealth: Payer: Commercial Managed Care - PPO | Admitting: Oncology

## 2020-03-13 ENCOUNTER — Ambulatory Visit: Payer: Commercial Managed Care - PPO | Admitting: Obstetrics & Gynecology

## 2020-03-18 ENCOUNTER — Ambulatory Visit: Payer: Medicare Other

## 2020-03-19 ENCOUNTER — Telehealth: Payer: Self-pay | Admitting: Pulmonary Disease

## 2020-03-19 NOTE — Telephone Encounter (Signed)
Called and spoke to patient, who is questioning if we do allergy testing in the office. She is aware that can order a rasp allergy panel.  Patient stated that she had rasp panel 66mo ago with ENT.  Patient stated that she would contact ENT for further allergy testing.   Nothing further needed at this time.

## 2020-03-25 ENCOUNTER — Ambulatory Visit: Payer: Commercial Managed Care - PPO

## 2020-04-02 ENCOUNTER — Encounter: Payer: Self-pay | Admitting: Oncology

## 2020-04-15 ENCOUNTER — Other Ambulatory Visit: Payer: Self-pay

## 2020-04-15 ENCOUNTER — Ambulatory Visit (INDEPENDENT_AMBULATORY_CARE_PROVIDER_SITE_OTHER): Payer: Medicare Other | Admitting: Obstetrics & Gynecology

## 2020-04-15 ENCOUNTER — Encounter: Payer: Self-pay | Admitting: Obstetrics & Gynecology

## 2020-04-15 VITALS — BP 110/60 | Ht 65.5 in | Wt 162.0 lb

## 2020-04-15 DIAGNOSIS — Z1231 Encounter for screening mammogram for malignant neoplasm of breast: Secondary | ICD-10-CM

## 2020-04-15 DIAGNOSIS — Z01419 Encounter for gynecological examination (general) (routine) without abnormal findings: Secondary | ICD-10-CM | POA: Diagnosis not present

## 2020-04-15 DIAGNOSIS — N39490 Overflow incontinence: Secondary | ICD-10-CM | POA: Diagnosis not present

## 2020-04-15 NOTE — Patient Instructions (Addendum)
PAP every three years Mammogram every year    Call [phone] to schedule at [facility] Colonoscopy every 10 years or Cologuard every 3 years Labs yearly (with PCP) Urogynecology Referral  Thank you for choosing [facility]. As part of our ongoing efforts to improve patient experience, we would appreciate your feedback. Please fill out the short survey that you will receive by mail or MyChart. Your opinion is important to [place]! - Dr. Kenton Kingfisher

## 2020-04-15 NOTE — Progress Notes (Signed)
HPI:      [name] is a 65 y.o. [phone] who LMP was in the past, she presents today for her examination related to urinary incontinence.  She has had for years,and about 10 years ago had TVH w Pubovag sling, with immediate and prolonged success.  She has mild LOU w stress, and wears liner.  She also more recently has noted that she can go a long time without sensing the urge to void, and then has a full bladder and may leak related to that.  Denies recent infection, nocturia, freq, urgency.  The patient has no other complaints today. The patient is sexually active. Herlast pap: approximate date 2020 and was normal and last mammogram: approximate date 2020 and was normal.  The patient does perform self breast exams.  There is no notable family history of breast or ovarian cancer in her family. The patient is not taking hormone replacement therapy. Patient denies post-menopausal vaginal bleeding.   The patient has regular exercise: yes. The patient denies current symptoms of depression.    GYN Hx: Last Cologuard 2022. Last DEXA: year ago.    PMHx: Past Medical History:  Diagnosis Date  . Asthma   . Cataract cortical, senile   . Complication of anesthesia   . Dysrhythmia   . Eosinophilic esophagitis   . GERD (gastroesophageal reflux disease)   . Headache    occular HA  . Heart murmur   . History of hiatal hernia   . Hx of dysplastic nevus    multiple sites  . Hx of squamous cell carcinoma of skin 06/22/2015   R infrascapular, SCC in situ  . Hypercholesteremia   . Hypertension   . Irregular heart beat    pac's and pvc's  . Pneumonia    11-2016  . PONV (postoperative nausea and vomiting)   . Renal cell cancer, left [facility])    Past Surgical History:  Procedure Laterality Date  . ABDOMINAL HYSTERECTOMY    . ABLATION     prior to hysterectomy  . CYSTOSCOPY/RETROGRADE/URETEROSCOPY     01-20-17 Dr. Junious Silk  . CYSTOSCOPY/RETROGRADE/URETEROSCOPY Left 01/20/2017   Procedure:  CYSTOSCOPY/RETROGRADE/URETEROSCOPY/ BIOPSY,BLADDER BIOPSY PLACEMENT STENT LEFT URETER;  Surgeon: Festus Aloe, MD;  Location: [facility];  Service: Urology;  Laterality: Left;  . DILATION AND CURETTAGE OF UTERUS    . ESOPHAGOGASTRODUODENOSCOPY N/A 12/16/2019   Procedure: ESOPHAGOGASTRODUODENOSCOPY (EGD);  Surgeon: Lesly Rubenstein, MD;  Location: [facility] ENDOSCOPY;  Service: Endoscopy;  Laterality: N/A;  . ESOPHAGOGASTRODUODENOSCOPY (EGD) WITH PROPOFOL N/A 01/09/2019   Procedure: ESOPHAGOGASTRODUODENOSCOPY (EGD) WITH PROPOFOL;  Surgeon: [place], Benay Pike, MD;  Location: [facility] ENDOSCOPY;  Service: Gastroenterology;  Laterality: N/A;  . NASAL ENDOSCOPY WITH EPISTAXIS CONTROL    . ROBOTIC ASSITED PARTIAL NEPHRECTOMY Left 02/24/2017   Procedure: XI ROBOTIC ASSITED LEFT  RADICAL NEPHRECTOMY;  Surgeon: Alexis Frock, MD;  Location: [facility]S;  Service: Urology;  Laterality: Left;  . TUBAL LIGATION     Family History  Problem Relation Age of Onset  . Hypertension Brother    Social History   Tobacco Use  . Smoking status: Never Smoker  . Smokeless tobacco: Never Used  Vaping Use  . Vaping Use: Never used  Substance Use Topics  . Alcohol use: No  . Drug use: Never    Current Outpatient Medications:  .  albuterol (VENTOLIN HFA) 108 (90 Base) MCG/ACT inhaler, Inhale 2 puffs into the lungs every 4 (four) hours as needed. , Disp: , Rfl:  .  ALPRAZolam (  XANAX) 0.25 MG tablet, Take 0.25 mg by mouth at bedtime as needed for anxiety. Anxiety., Disp: , Rfl: 5 .  amiodarone (PACERONE) 200 MG tablet, Take 1 tablet (200 mg total) by mouth daily., Disp: 30 tablet, Rfl: 1 .  apixaban (ELIQUIS) 5 MG TABS tablet, Take 1 tablet (5 mg total) by mouth 2 (two) times daily., Disp: 60 tablet, Rfl: 1 .  atorvastatin (LIPITOR) 40 MG tablet, Take 40 mg by mouth daily., Disp: , Rfl:  .  Cholecalciferol (VITAMIN D3) 1000 units CAPS, Take 1 capsule by mouth daily., Disp: , Rfl:  .  diltiazem (CARDIZEM) 30 MG tablet, Take 1  tablet (30 mg total) by mouth 4 (four) times daily as needed (for heart rate over 120)., Disp: 30 tablet, Rfl: 1 .  FLOVENT HFA 110 MCG/ACT inhaler, Inhale 2 puffs into the lungs 2 (two) times daily as needed., Disp: , Rfl:  .  fluticasone (FLONASE) 50 MCG/ACT nasal spray, Place 2 sprays into both nostrils daily., Disp: , Rfl:  .  ipratropium-albuterol (DUONEB) 0.5-2.5 (3) MG/3ML SOLN, Take 3 mLs by nebulization., Disp: , Rfl:  .  Mepolizumab (NUCALA) 100 MG/ML SOAJ, INJECT 1 PEN UNDER THE SKIN EVERY 28 DAYS, Disp: 1 mL, Rfl: 11 .  montelukast (SINGULAIR) 10 MG tablet, Take 1 tablet (10 mg total) by mouth daily., Disp: , Rfl:  .  olmesartan (BENICAR) 20 MG tablet, Take 20 mg by mouth daily., Disp: , Rfl:  .  pantoprazole (PROTONIX) 40 MG tablet, Take 1 tablet (40 mg total) by mouth 2 (two) times daily., Disp: , Rfl:  .  potassium chloride (MICRO-K) 10 MEQ CR capsule, Take 20 mEq by mouth daily., Disp: , Rfl: 11 .  RABEprazole (ACIPHEX) 20 MG tablet, Take 20 mg by mouth daily., Disp: , Rfl:  .  ranitidine (ZANTAC) 75 MG tablet, Take 1 tablet (75 mg total) by mouth 2 (two) times daily., Disp: , Rfl:  .  rivaroxaban (XARELTO) 20 MG TABS tablet, Take 1 tablet by mouth daily with breakfast., Disp: , Rfl:  .  sucralfate (CARAFATE) 1 g tablet, Take 1 g by mouth 2 (two) times daily., Disp: , Rfl:  .  triamterene-hydrochlorothiazide (DYAZIDE) 37.5-25 MG per capsule, Take 1 capsule by mouth daily., Disp: , Rfl:  .  zafirlukast (ACCOLATE) 20 MG tablet, TAKE 1 TABLET BY MOUTH TWICE A DAY (Patient taking differently: Take 20 mg by mouth 2 (two) times daily before a meal.), Disp: 180 tablet, Rfl: 1 .  levalbuterol (XOPENEX) 0.63 MG/3ML nebulizer solution, Take 3 mLs (0.63 mg total) by nebulization every 4 (four) hours as needed for wheezing or shortness of breath. Use as needed if Xopenex inhaler provides insufficient relief of shortness of breath, wheezing, chest tightness, Disp: 30 mL, Rfl: 5 Allergies: Benzoin,  Ciprofloxacin, and Steri-strip compound benzoin [benzoin compound]  Review of Systems  Constitutional: Positive for malaise/fatigue. Negative for chills and fever.  HENT: Negative for congestion, sinus pain and sore throat.   Eyes: Negative for blurred vision and pain.  Respiratory: Negative for cough and wheezing.   Cardiovascular: Negative for chest pain and leg swelling.  Gastrointestinal: Positive for constipation. Negative for abdominal pain, diarrhea, heartburn, nausea and vomiting.  Genitourinary: Negative for dysuria, frequency, hematuria and urgency.  Musculoskeletal: Negative for back pain, joint pain, myalgias and neck pain.  Skin: Negative for itching and rash.  Neurological: Negative for dizziness, tremors and weakness.  Endo/Heme/Allergies: Does not bruise/bleed easily.  Psychiatric/Behavioral: Negative for depression. The patient is not nervous/anxious  and does not have insomnia.     Objective: BP 110/60   Ht 5' 5.5" (1.664 m)   Wt 162 lb (73.5 kg)   LMP  (LMP Unknown)   BMI 26.55 kg/m   Filed Weights   04/15/20 1427  Weight: 162 lb (73.5 kg)   Body mass index is 26.55 kg/m. Physical Exam Constitutional:      General: She is not in acute distress.    Appearance: She is well-developed.  Genitourinary:     Vulva, bladder, rectum and urethral meatus normal.     No lesions in the vagina.     Genitourinary Comments: Vaginal cuff well healed     Right Labia: No rash or tenderness.    Left Labia: No tenderness or rash.    Vaginal cuff intact.    No vaginal bleeding.     Anterior and posterior vaginal prolapse present.    Mild vaginal atrophy present.    Vaginal exam comments: Mild cystocele and rectocele  (Gr 1).      Right Adnexa: not tender and no mass present.    Left Adnexa: not tender and no mass present.    Cervix is absent.     Uterus is absent.     Pelvic exam was performed with patient in the lithotomy position.  Breasts:     Right: No mass, skin  change or tenderness.     Left: No mass, skin change or tenderness.    HENT:     Head: Normocephalic and atraumatic. No laceration.     Right Ear: Hearing normal.     Left Ear: Hearing normal.     Mouth/Throat:     Pharynx: Uvula midline.  Eyes:     Pupils: Pupils are equal, round, and reactive to light.  Neck:     Thyroid: No thyromegaly.  Cardiovascular:     Rate and Rhythm: Normal rate and regular rhythm.     Heart sounds: No murmur heard. No friction rub. No gallop.   Pulmonary:     Effort: Pulmonary effort is normal. No respiratory distress.     Breath sounds: Normal breath sounds. No wheezing.  Abdominal:     General: Bowel sounds are normal. There is no distension.     Palpations: Abdomen is soft.     Tenderness: There is no abdominal tenderness. There is no rebound.  Musculoskeletal:        General: Normal range of motion.     Cervical back: Normal range of motion and neck supple.  Neurological:     Mental Status: She is alert and oriented to person, place, and time.     Cranial Nerves: No cranial nerve deficit.  Skin:    General: Skin is warm and dry.  Psychiatric:        Judgment: Judgment normal.  Vitals reviewed.     Assessment:  1. Women's annual routine gynecological examination   2. Encounter for screening mammogram for malignant neoplasm of breast   3. Overflow incontinence of urine     Plan:            1.  Vaginal Screening-  Pap smear schedule reviewed with patient, every 5 years  2. Breast screening- Exam annually and mammogram scheduled  3. Colonoscopy every 10 years, Hemoccult testing after age 63  4. Labs managed by PCP  5. Counseling for hormonal therapy: none              6. FRAX - FRAX score  for assessing the 10 year probability for fracture calculated and discussed today.  Based on age and score today, DEXA was just done 12/21.   7. Urogyn referral to assess for work up and therapy for her bladder concerns, particularly their  potential neurologic nature.    F/U  Return in about 1 year (around 04/15/2021) for Annual.  Barnett Applebaum, MD, [facility], [facility] 04/15/2020  3:07 PM

## 2020-04-20 ENCOUNTER — Encounter: Payer: Self-pay | Admitting: Internal Medicine

## 2020-04-20 ENCOUNTER — Ambulatory Visit (INDEPENDENT_AMBULATORY_CARE_PROVIDER_SITE_OTHER): Payer: Medicare Other | Admitting: Internal Medicine

## 2020-04-20 VITALS — BP 122/72 | HR 56 | Ht 65.0 in | Wt 160.0 lb

## 2020-04-20 DIAGNOSIS — J4540 Moderate persistent asthma, uncomplicated: Secondary | ICD-10-CM

## 2020-04-20 DIAGNOSIS — J3089 Other allergic rhinitis: Secondary | ICD-10-CM | POA: Diagnosis not present

## 2020-04-20 DIAGNOSIS — I480 Paroxysmal atrial fibrillation: Secondary | ICD-10-CM

## 2020-04-20 DIAGNOSIS — K200 Eosinophilic esophagitis: Secondary | ICD-10-CM | POA: Diagnosis not present

## 2020-04-20 DIAGNOSIS — R079 Chest pain, unspecified: Secondary | ICD-10-CM

## 2020-04-20 DIAGNOSIS — J302 Other seasonal allergic rhinitis: Secondary | ICD-10-CM

## 2020-04-20 NOTE — Patient Instructions (Signed)
You have been scheduled for an esophageal manometry/PH Impedance  at [facility] on 05/04/2020 at 10:30am. Please arrive 30 minutes prior to your procedure for registration. You will need to go to outpatient registration (1st floor of the hospital) first. Make certain to bring your insurance cards as well as a complete list of medications.  Please remember the following:  1) Do not take any muscle relaxants, xanax (alprazolam) or ativan for 1 day prior to your test as well as the day of the test.  2) Nothing to eat or drink for 4 hours before your test.  3) Hold all diabetic medications/insulin the morning of the test. You may eat and take your medications after the test.  It will take at least 2 weeks to receive the results of this test from your physician. ------------------------------------------ ABOUT ESOPHAGEAL MANOMETRY Esophageal manometry (muh-NOM-uh-tree) is a test that gauges how well your esophagus works. Your esophagus is the long, muscular tube that connects your throat to your stomach. Esophageal manometry measures the rhythmic muscle contractions (peristalsis) that occur in your esophagus when you swallow. Esophageal manometry also measures the coordination and force exerted by the muscles of your esophagus.  During esophageal manometry, a thin, flexible tube (catheter) that contains sensors is passed through your nose, down your esophagus and into your stomach. Esophageal manometry can be helpful in diagnosing some mostly uncommon disorders that affect your esophagus.  Why it's done Esophageal manometry is used to evaluate the movement (motility) of food through the esophagus and into the stomach. The test measures how well the circular bands of muscle (sphincters) at the top and bottom of your esophagus open and close, as well as the pressure, strength and pattern of the wave of esophageal muscle contractions that moves food along.  What you can expect Esophageal manometry  is an outpatient procedure done without sedation. Most people tolerate it well. You may be asked to change into a hospital gown before the test starts.  During esophageal manometry  . While you are sitting up, a member of your health care team sprays your throat with a numbing medication or puts numbing gel in your nose or both.  . A catheter is guided through your nose into your esophagus. The catheter may be sheathed in a water-filled sleeve. It doesn't interfere with your breathing. However, your eyes may water, and you may gag. You may have a slight nosebleed from irritation.  . After the catheter is in place, you may be asked to lie on your back on an exam table, or you may be asked to remain seated.  . You then swallow small sips of water. As you do, a computer connected to the catheter records the pressure, strength and pattern of your esophageal muscle contractions.  . During the test, you'll be asked to breathe slowly and smoothly, remain as still as possible, and swallow only when you're asked to do so.  . A member of your health care team may move the catheter down into your stomach while the catheter continues its measurements.  . The catheter then is slowly withdrawn. The test usually lasts 20 to 30 minutes.  After esophageal manometry  When your esophageal manometry is complete, you may return to your normal activities  This test typically takes 30-45 minutes to complete.  Due to recent COVID-19 restrictions implemented by our local and state authorities and in an effort to keep both patients and staff as safe as possible, our hospital system now requires  COVID-19 testing prior to any scheduled hospital procedure. Please go to [place], [place], [place] [address] on 04/30/2020 at  10:25am. This is a drive up testing site, you will not need to exit your vehicle.  You will not be billed at the time of testing but may receive a bill later depending on your insurance. The approximate  cost of the test is $100. You must agree to quarantine from the time of your testing until the procedure date on 05/04/2020 . This should include staying at home with ONLY the people you live with. Avoid take-out, grocery store shopping or leaving the house for any non-emergent reason. Failure to have your COVID-19 test done on the date and time you have been scheduled will result in cancellation of procedure. Please call our office at [phone] if you have any questions.   STAY Off Pantoprazole and Zantac   We will obtain records from Dr Margaretha Sheffield, MD   You will need to have your allergy testing results forwarded to [place] through My Chart or fax to Dr Carlean Purl at [phone]  We will refer you to [facility] and they will contact you with that appointment  Due to recent changes in healthcare laws, you may see the results of your imaging and laboratory studies on MyChart before your provider has had a chance to review them.  We understand that in some cases there may be results that are confusing or concerning to you. Not all laboratory results come back in the same time frame and the provider may be waiting for multiple results in order to interpret others.  Please give [place] 48 hours in order for your provider to thoroughly review all the results before contacting the office for clarification of your results.   I appreciate the  opportunity to care for you  Thank You   Ronney Lion   ________________________________________________________________________________

## 2020-04-20 NOTE — Progress Notes (Signed)
Heidi Torres 65 y.o. Oct 15, 1955 [id number]  Assessment & Plan:   Encounter Diagnoses  Name Primary?  . Chest pain, unspecified type Yes  . Eosinophilic esophagitis   . Paroxysmal atrial fibrillation (HCC)   . Perennial allergic rhinitis with seasonal variation   . Moderate persistent asthma, unspecified whether complicated     I have not seen someone have reflux trigger A. fib though I am sure it is possible and I am not sure it is because she does not really correlate a reflux episode she awakens and has symptoms and is in A. Fib.  I wonder about an event monitor to better understand these episodes.  Will refer to the A. fib/EP clinic.  I will see her back in June, she is going to have an esophageal manometry and pH probe/impedance test to better understand these episodes and what might be happening in the esophagus causing chest pain etc.  Her chest pain certainly could be related to A. Fib.  I await this I will ALCAT allergy test she is pursuing as well.  This will explore food sensitivities/allergies.  She is very curious to see if there are food triggers for her problems.  She has not really been able to identify triggers.  I did mention that formal evaluation by an allergist/immunologist may be of benefit depending upon what we sort through here.  I appreciate the opportunity to care for this patient. CC: [name], MD     Subjective:   Chief Complaint: Reflux chest pain A. fib  HPI The patient is a 65 year old white woman with past medical history of GERD, renal cell cancer resection pulmonary MAI eosinophilic esophagitis chronic kidney disease and A. fib and she is self-referred for evaluation of reflux problems that she thinks are triggering atrial fibrillation episodes.  She relates she has had 7 or 8 episodes of atrial fibrillation over her lifetime and has had a 20-year history of reflux.  She will awaken at night with chest pain that she concludes is  from gastric pressure, associated with burping and this has been associated with her episodes of atrial fibrillation.  She believes if she lies flat and happens to eat late this will happen.  The sequences chest pain and within 5 minutes she belches and then she notices she is in A. fib at some point.  She is not having heartburn regurgitation of fluid pyrosis etc. during these episodes.  She can have terrible pressure in the chest during these episodes and she says Tums and Gaviscon may help.  She was recently admitted down in [place] when this happened to her they wanted to do a cardiac cath though other testing was negative she declined and follow-up with her [facility] clinic cardiology.  She was placed on anticoagulation and would like to "fix this A. fib so I do not have to take that".  She had a Holter monitor without evidence of A. fib twice in the last year I think.  The episode where she had to go to [place] in [place] she had chest pain and night like I described, and had been eating pork but and other celebratory foods.  She had taken water Tums Gaviscon and Pepcid AC without benefit at that time.  Diltiazem had been prescribed as well.  She is also been undergoing allergy testing through ear nose and throat.  Since the visit those records have come in.  In 2021, March of that year she had a battery  of tests.  The serologic test showed IgE positivity to milk cheese egg crab shrimp and lobster.  These looked like low-grade positivity.  She had skin testing with a multiple of allergy wheals to grasses and plant like things, and immunotherapy was okayed but I do not think she has done that.  She had some dysphonia and hoarseness since she has had a small vocal cord nodule seen thought to be singers nodules.  She had had some pain referred to her ear as well.  It was thought she probably had LPR.  Vocal cord nodules seem to be getting smaller in April 2021 follow-up.  She was also diagnosed with TMJ.   None of these notes mention her allergy testing.  She is pursuing some sort of online not covered allergy testing and those results are not in yet.  She did follow-up about allergies in March 2022.  She had acute laryngopharyngitis.  She had allergic rhinitis due to pollen.  Trees grasses weeds epithelials and molds.  Tested positive for ragweed dust mites cat dog cockroach grasses cedar Elm and hickory tree.  She is going to do sublingual drops for allergy therapy rather than immunotherapy due to multiple trips this summer.   Regarding previous GI work-up EGD on January 2021 showed the following conclusions, performed by Dr. Alice Reichert  - Normal hypopharynx. - Esophageal mucosal changes suspicious for eosinophilic esophagitis. Biopsied. - Benign-appearing esophageal stenosis. Dilated. - Gastritis. Biopsied. - Multiple gastric polyps. Biopsied. - Normal examined duodenum. - The examination was otherwise normal.  Pathology as below A. STOMACH, ANTRUM; COLD BIOPSY:  - GASTRIC ANTRAL MUCOSA WITH FEATURES OF REACTIVE GASTROPATHY.  - NEGATIVE FOR H. PYLORI, DYSPLASIA, AND MALIGNANCY.   B. STOMACH POLYPS, BODY; COLD BIOPSY:  - CHANGES SUGGESTIVE OF EARLY FUNDIC GLAND POLYPS.  - NEGATIVE FOR H. PYLORI, DYSPLASIA, AND MALIGNANCY.   C. ESOPHAGUS, PROXIMAL AND DISTAL; COLD BIOPSY:  - SQUAMOUS MUCOSA WITH ACANTHOSIS, SPONGIOSIS, AND MARKEDLY INCREASED  EOSINOPHILS (UP TO GREATER THAN 40 PER HPF), COMPATIBLE WITH CLINICAL  IMPRESSION OF EOSINOPHILIC ESOPHAGITIS.  - FOCAL GLANDULAR MUCOSA WITH NO SIGNIFICANT HISTOPATHOLOGIC CHANGE.  - NEGATIVE FOR INTESTINAL METAPLASIA, DYSPLASIA, AND MALIGNANCY.    A repeat EGD in 12/23/2019 by Dr. Haig Prophet of [facility] Widely patent Schatzki ring. - Multiple fundic gland polyps. - Normal examined duodenum. - Biopsies were taken with a cold fo   with pathology as below   DIAGNOSIS:  A. STOMACH; COLD BIOPSY:  - GASTRIC ANTRAL MUCOSA WITH REACTIVE  GASTROPATHY, INTESTINAL  METAPLASIA, AND FOCAL ACTIVE INFLAMMATION.  - GASTRIC OXYNTIC MUCOSA WITH MILD CHRONIC INACTIVE GASTRITIS.  - NEGATIVE FOR H. PYLORI, DYSPLASIA, AND MALIGNANCY.   Comment:  Due to the presence of focal active inflammation, an immunohistochemical  study directed against H. pylori was performed, and is negative.   B. ESOPHAGUS, DISTAL; COLD BIOPSY:  - BENIGN SQUAMOUS MUCOSA WITH MILD ACANTHOSIS AND SPONGIOSIS.  - NO INCREASE IN INTRAEPITHELIAL EOSINOPHILS (UP TO 2 PER HPF).  - NEGATIVE FOR DYSPLASIA AND MALIGNANCY.   C. ESOPHAGUS, PROXIMAL; COLD BIOPSY:  - BENIGN SQUAMOUS MUCOSA WITH MILD ACANTHOSIS.  - NO INCREASE IN INTRAEPITHELIAL EOSINOPHILS (LESS THAN 2 PER HPF).  - NEGATIVE FOR DYSPLASIA AND MALIGNANCY.   September 2020 1 GI note shows that she had been treated with rabeprazole and Carafate.  She had had a fluticasone trial which at a follow-up in June 2021 noted globus persisted she did not really want to persist that therapy.  She was complaining of reflux  and acid regurgitation and nausea and ongoing globus and intermittent dysphagia in September 2021 visit with [name] and she is not describing that so much now. Allergies  Allergen Reactions  . Benzoin     Other reaction(s): Other (See Comments) Blisters  . Ciprofloxacin Other (See Comments)    Arms were numb  . Steri-Strip Compound Benzoin [Benzoin Compound] Other (See Comments)    Blisters   Current Meds  Medication Sig  . albuterol (VENTOLIN HFA) 108 (90 Base) MCG/ACT inhaler Inhale 2 puffs into the lungs every 4 (four) hours as needed.   . ALPRAZolam (XANAX) 0.25 MG tablet Take 0.25 mg by mouth at bedtime as needed for anxiety. Anxiety.  Marland Kitchen atorvastatin (LIPITOR) 40 MG tablet Take 40 mg by mouth daily.  . Cholecalciferol (VITAMIN D3) 1000 units CAPS Take 1 capsule by mouth daily.  Marland Kitchen levalbuterol (XOPENEX) 0.63 MG/3ML nebulizer solution Take 3 mLs (0.63 mg total) by nebulization every 4  (four) hours as needed for wheezing or shortness of breath. Use as needed if Xopenex inhaler provides insufficient relief of shortness of breath, wheezing, chest tightness  . Mepolizumab (NUCALA) 100 MG/ML SOAJ INJECT 1 PEN UNDER THE SKIN EVERY 28 DAYS  . montelukast (SINGULAIR) 10 MG tablet Take 1 tablet (10 mg total) by mouth daily.  Marland Kitchen olmesartan (BENICAR) 20 MG tablet Take 20 mg by mouth daily.  . potassium chloride (MICRO-K) 10 MEQ CR capsule Take 20 mEq by mouth daily.  . ranitidine (ZANTAC) 75 MG tablet Take 1 tablet (75 mg total) by mouth 2 (two) times daily. (Patient taking differently: Take 75 mg by mouth as needed.)  . rivaroxaban (XARELTO) 20 MG TABS tablet Take 1 tablet by mouth daily with breakfast.  . triamterene-hydrochlorothiazide (DYAZIDE) 37.5-25 MG per capsule Take 1 capsule by mouth daily.  . zafirlukast (ACCOLATE) 20 MG tablet TAKE 1 TABLET BY MOUTH TWICE A DAY (Patient taking differently: Take 20 mg by mouth 2 (two) times daily before a meal.)   Past Medical History:  Diagnosis Date  . Asthma   . Atrial fibrillation ([place])   . Cataract cortical, senile   . Chronic kidney disease   . Complication of anesthesia   . Dysrhythmia   . Eosinophilic esophagitis   . Esophageal stricture   . GERD (gastroesophageal reflux disease)   . Headache    occular HA  . Heart murmur   . History of hiatal hernia   . Hx of dysplastic nevus    multiple sites  . Hx of squamous cell carcinoma of skin 06/22/2015   R infrascapular, SCC in situ  . Hypercholesteremia   . Hypertension   . Irregular heart beat    pac's and pvc's  . Pneumonia    11-2016  . PONV (postoperative nausea and vomiting)   . Renal cell cancer, left ([place])   . Status post dilation of esophageal narrowing    Past Surgical History:  Procedure Laterality Date  . ABDOMINAL HYSTERECTOMY    . ABLATION     prior to hysterectomy  . CYSTOSCOPY/RETROGRADE/URETEROSCOPY Left 01/20/2017   Procedure:  CYSTOSCOPY/RETROGRADE/URETEROSCOPY/ BIOPSY,BLADDER BIOPSY PLACEMENT STENT LEFT URETER;  Surgeon: Festus Aloe, MD;  Location: [facility];  Service: Urology;  Laterality: Left;  . DILATION AND CURETTAGE OF UTERUS    . ESOPHAGOGASTRODUODENOSCOPY N/A 12/16/2019   Procedure: ESOPHAGOGASTRODUODENOSCOPY (EGD);  Surgeon: Lesly Rubenstein, MD;  Location: [facility];  Service: Endoscopy;  Laterality: N/A;  . ESOPHAGOGASTRODUODENOSCOPY (EGD) WITH PROPOFOL N/A 01/09/2019   Procedure: ESOPHAGOGASTRODUODENOSCOPY (EGD)  WITH PROPOFOL;  Surgeon: Toledo, Benay Pike, MD;  Location: [facility];  Service: Gastroenterology;  Laterality: N/A;  . NASAL ENDOSCOPY WITH EPISTAXIS CONTROL    . ROBOTIC ASSITED PARTIAL NEPHRECTOMY Left 02/24/2017   Procedure: XI ROBOTIC ASSITED LEFT  RADICAL NEPHRECTOMY;  Surgeon: Alexis Frock, MD;  Location: [facility];  Service: Urology;  Laterality: Left;  . TUBAL LIGATION     Social History   Social History Narrative   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  No alcohol no caffeine no drug use or other tobacco.   family history includes AAA (abdominal aortic aneurysm) in her maternal grandmother; Heart disease in her father and mother; Hyperlipidemia in her mother; Hypertension in her brother, father, and sister; Lung cancer in her maternal grandfather; Squamous cell carcinoma in her father.   Review of Systems As per HPI some arthritis in fingers floaters fatigue allergies and asthma headaches.  All other review of systems negative or as per HPI.  Objective:   Physical Exam BP 122/72 (BP Location: Left Arm, Patient Position: Sitting, Cuff Size: Normal)   Pulse (!) 56   Ht 5\' 5"  (1.651 m) Comment: height measured without shoes  Wt 160 lb (72.6 kg)   LMP  (LMP Unknown)   BMI 26.63 kg/m  2/6 murmur RUSB known abd midline scar Pharynx clear

## 2020-04-21 ENCOUNTER — Other Ambulatory Visit: Payer: Self-pay

## 2020-04-21 ENCOUNTER — Other Ambulatory Visit: Payer: Self-pay | Admitting: Oncology

## 2020-04-21 ENCOUNTER — Ambulatory Visit
Admission: RE | Admit: 2020-04-21 | Discharge: 2020-04-21 | Disposition: A | Discharge status: Home / Self Care | Payer: Medicare Other | Source: Ambulatory Visit | Attending: Oncology | Admitting: Oncology

## 2020-04-21 DIAGNOSIS — C642 Malignant neoplasm of left kidney, except renal pelvis: Secondary | ICD-10-CM | POA: Diagnosis not present

## 2020-04-23 ENCOUNTER — Encounter: Payer: Self-pay | Admitting: Internal Medicine

## 2020-04-24 ENCOUNTER — Telehealth: Payer: Self-pay | Admitting: Internal Medicine

## 2020-04-24 ENCOUNTER — Other Ambulatory Visit: Payer: Self-pay | Admitting: Obstetrics & Gynecology

## 2020-04-24 NOTE — Telephone Encounter (Signed)
All questions answered about mano/pH probe

## 2020-04-24 NOTE — Progress Notes (Signed)
Normal Mammogram, and this is excellent news!  Recommend follow up yearly.  Barnett Applebaum, MD, [facility], [facility] 04/24/2020  8:27 AM

## 2020-04-28 ENCOUNTER — Ambulatory Visit (HOSPITAL_COMMUNITY): Payer: Medicare Other | Admitting: Physician Assistant

## 2020-04-30 ENCOUNTER — Other Ambulatory Visit (HOSPITAL_COMMUNITY)
Admission: RE | Admit: 2020-04-30 | Discharge: 2020-04-30 | Disposition: A | Discharge status: Home / Self Care | Payer: Medicare Other | Source: Ambulatory Visit | Attending: Internal Medicine | Admitting: Internal Medicine

## 2020-04-30 ENCOUNTER — Telehealth: Payer: Self-pay | Admitting: Internal Medicine

## 2020-04-30 DIAGNOSIS — Z01812 Encounter for preprocedural laboratory examination: Secondary | ICD-10-CM | POA: Insufficient documentation

## 2020-04-30 DIAGNOSIS — Z20822 Contact with and (suspected) exposure to covid-19: Secondary | ICD-10-CM | POA: Insufficient documentation

## 2020-04-30 LAB — SARS CORONAVIRUS 2 (TAT 6-24 HRS): SARS Coronavirus 2: NEGATIVE

## 2020-04-30 NOTE — Telephone Encounter (Signed)
Patient with a cough that is productive.  They are not sure if the grandchild was positive or not for COVID.  Patient's BINAX test was negative.  Patient is scheduled for a esophageal mano on Monday.she has a productive green sputum cough, no fever.  Patient will go for her COVID screen today.  She will try some mucenex OTC and increase her fluid production today.  If she is not feeling better by tomorrow she will call me and we will cancel her procedure for Monday.  She understands that she will have to be rescheduled if positive.

## 2020-05-01 NOTE — Telephone Encounter (Signed)
Patient called in. Stated she was negative for COVID but still wants to reschedule appointment because she can not stop coughing.

## 2020-05-01 NOTE — Telephone Encounter (Signed)
Patient has been rescheduled to 5/18 10:30.  She is aware to reschedule for 5/16 1:00

## 2020-05-13 ENCOUNTER — Other Ambulatory Visit: Payer: Self-pay

## 2020-05-13 ENCOUNTER — Encounter (HOSPITAL_COMMUNITY): Payer: Self-pay | Admitting: Physician Assistant

## 2020-05-13 ENCOUNTER — Ambulatory Visit (HOSPITAL_COMMUNITY)
Admission: RE | Admit: 2020-05-13 | Discharge: 2020-05-13 | Disposition: A | Discharge status: Home / Self Care | Payer: Medicare Other | Source: Ambulatory Visit | Attending: Physician Assistant | Admitting: Physician Assistant

## 2020-05-13 VITALS — BP 102/66 | HR 65 | Ht 65.0 in | Wt 161.8 lb

## 2020-05-13 DIAGNOSIS — C642 Malignant neoplasm of left kidney, except renal pelvis: Secondary | ICD-10-CM | POA: Diagnosis not present

## 2020-05-13 DIAGNOSIS — I10 Essential (primary) hypertension: Secondary | ICD-10-CM | POA: Insufficient documentation

## 2020-05-13 DIAGNOSIS — K219 Gastro-esophageal reflux disease without esophagitis: Secondary | ICD-10-CM | POA: Insufficient documentation

## 2020-05-13 DIAGNOSIS — Z8249 Family history of ischemic heart disease and other diseases of the circulatory system: Secondary | ICD-10-CM | POA: Insufficient documentation

## 2020-05-13 DIAGNOSIS — I480 Paroxysmal atrial fibrillation: Secondary | ICD-10-CM | POA: Insufficient documentation

## 2020-05-13 DIAGNOSIS — Z7901 Long term (current) use of anticoagulants: Secondary | ICD-10-CM | POA: Diagnosis not present

## 2020-05-13 DIAGNOSIS — E785 Hyperlipidemia, unspecified: Secondary | ICD-10-CM | POA: Diagnosis not present

## 2020-05-13 DIAGNOSIS — D6869 Other thrombophilia: Secondary | ICD-10-CM | POA: Insufficient documentation

## 2020-05-13 DIAGNOSIS — Z79899 Other long term (current) drug therapy: Secondary | ICD-10-CM | POA: Insufficient documentation

## 2020-05-13 DIAGNOSIS — Z888 Allergy status to other drugs, medicaments and biological substances status: Secondary | ICD-10-CM | POA: Diagnosis not present

## 2020-05-13 NOTE — Progress Notes (Signed)
Primary Care Physician: Heidi Body, MD Primary Cardiologist: Dr Clayborn Bigness  Primary Electrophysiologist: none Referring Physician: Dr Heidi Garner Torres is a 65 y.o. female with a history of HTN, HLD, GERD, renal cell cancer, atrial fibrillation who presents for consultation in the [facility].  The patient was initially diagnosed with atrial fibrillation remotely and has been maintained on PRN diltiazem. On 02/12/20 she presented to the ED with SOB, palpitations, and indigestion. ECG showed afib with RVR and she was started on amiodarone although this was discontinued shortly theraftere. Patient is on Xarelto for a CHADS2VASC score of 3. She reports that she has had 7-8 episodes of afib requiring hospitalization over the last 20 years. She believes her GERD is a trigger for her. She follows with Dr Clayborn Bigness at [facility].   Today, she denies symptoms of palpitations, chest pain, shortness of breath, orthopnea, PND, lower extremity edema, dizziness, presyncope, syncope, snoring, daytime somnolence, bleeding, or neurologic sequela. The patient is tolerating medications without difficulties and is otherwise without complaint today.    Atrial Fibrillation Risk Factors:  she does not have symptoms or diagnosis of sleep apnea. she does not have a history of rheumatic fever. she does not have a history of alcohol use. The patient does have a history of early familial atrial fibrillation or other arrhythmias. Brother and mother have afib.  she has a BMI of Torres mass index is 26.92 kg/m.Marland Kitchen Filed Weights   05/13/20 1317  Weight: 73.4 kg    Family History  Problem Relation Age of Onset  . Hypertension Brother   . Heart disease Mother   . Hyperlipidemia Mother   . Squamous cell carcinoma Father   . Heart disease Father   . Hypertension Father   . Hypertension Sister   . AAA (abdominal aortic aneurysm) Maternal Grandmother   . Lung cancer  Maternal Grandfather        smoker     Atrial Fibrillation Management history:  Previous antiarrhythmic drugs: amiodarone  Previous cardioversions: none Previous ablations: none CHADS2VASC score: 3 Anticoagulation history: Xarelto    Past Medical History:  Diagnosis Date  . Asthma   . Atrial fibrillation ([place])   . Cataract cortical, senile   . Chronic kidney disease   . Complication of anesthesia   . Dysrhythmia   . Eosinophilic esophagitis   . Esophageal stricture   . GERD (gastroesophageal reflux disease)   . Headache    occular HA  . Heart murmur   . History of hiatal hernia   . Hx of dysplastic nevus    multiple sites  . Hx of squamous cell carcinoma of skin 06/22/2015   R infrascapular, SCC in situ  . Hypercholesteremia   . Hypertension   . Irregular heart beat    pac's and pvc's  . Pneumonia    11-2016  . PONV (postoperative nausea and vomiting)   . Renal cell cancer, left (Heidi Torres)   . Status post dilation of esophageal narrowing    Past Surgical History:  Procedure Laterality Date  . ABDOMINAL HYSTERECTOMY    . ABLATION     prior to hysterectomy  . CYSTOSCOPY/RETROGRADE/URETEROSCOPY Left 01/20/2017   Procedure: CYSTOSCOPY/RETROGRADE/URETEROSCOPY/ BIOPSY,BLADDER BIOPSY PLACEMENT STENT LEFT URETER;  Surgeon: Festus Aloe, MD;  Location: [facility];  Service: Urology;  Laterality: Left;  . DILATION AND CURETTAGE OF UTERUS    . ESOPHAGOGASTRODUODENOSCOPY N/A 12/16/2019   Procedure: ESOPHAGOGASTRODUODENOSCOPY (EGD);  Surgeon: Lesly Rubenstein, MD;  Location: [facility] ENDOSCOPY;  Service: Endoscopy;  Laterality: N/A;  . ESOPHAGOGASTRODUODENOSCOPY (EGD) WITH PROPOFOL N/A 01/09/2019   Procedure: ESOPHAGOGASTRODUODENOSCOPY (EGD) WITH PROPOFOL;  Surgeon: Toledo, Benay Pike, MD;  Location: [facility] ENDOSCOPY;  Service: Gastroenterology;  Laterality: N/A;  . NASAL ENDOSCOPY WITH EPISTAXIS CONTROL    . ROBOTIC ASSITED PARTIAL NEPHRECTOMY Left 02/24/2017   Procedure: XI ROBOTIC  ASSITED LEFT  RADICAL NEPHRECTOMY;  Surgeon: Alexis Frock, MD;  Location: [facility];  Service: Urology;  Laterality: Left;  . TUBAL LIGATION      Current Outpatient Medications  Medication Sig Dispense Refill  . albuterol (VENTOLIN HFA) 108 (90 Base) MCG/ACT inhaler Inhale 2 puffs into the lungs every 4 (four) hours as needed.     . ALPRAZolam (XANAX) 0.25 MG tablet Take 0.25 mg by mouth at bedtime as needed for anxiety. Anxiety.  5  . atorvastatin (LIPITOR) 40 MG tablet Take 40 mg by mouth daily.    . Cholecalciferol (VITAMIN D3) 1000 units CAPS Take 1 capsule by mouth daily.    Marland Kitchen EPINEPHrine 0.3 mg/0.3 mL IJ SOAJ injection See admin instructions.    . hydrALAZINE (APRESOLINE) 25 MG tablet Take 25 mg by mouth 2 (two) times daily.    Marland Kitchen levalbuterol (XOPENEX) 0.63 MG/3ML nebulizer solution Take 3 mLs (0.63 mg total) by nebulization every 4 (four) hours as needed for wheezing or shortness of breath. Use as needed if Xopenex inhaler provides insufficient relief of shortness of breath, wheezing, chest tightness 30 mL 5  . magnesium 30 MG tablet Take 30 mg by mouth 2 (two) times daily.    . Mepolizumab (NUCALA) 100 MG/ML SOAJ INJECT 1 PEN UNDER THE SKIN EVERY 28 DAYS 1 mL 11  . olmesartan (BENICAR) 20 MG tablet Take 20 mg by mouth daily.    . potassium chloride (MICRO-K) 10 MEQ CR capsule Take 20 mEq by mouth daily.  11  . rivaroxaban (XARELTO) 20 MG TABS tablet Take 1 tablet by mouth daily with breakfast.    . triamterene-hydrochlorothiazide (DYAZIDE) 37.5-25 MG per capsule Take 1 capsule by mouth daily.    . zafirlukast (ACCOLATE) 20 MG tablet TAKE 1 TABLET BY MOUTH TWICE A DAY 180 tablet 1   No current facility-administered medications for this encounter.    Allergies  Allergen Reactions  . Benzoin     Other reaction(s): Other (See Comments) Blisters  . Ciprofloxacin Other (See Comments)    Arms were numb  . Steri-Strip Compound Benzoin [Benzoin Compound] Other (See Comments)     Blisters    Social History   Socioeconomic History  . Marital status: Married    Spouse name: Not on file  . Number of children: 3  . Years of education: Not on file  . Highest education level: Not on file  Occupational History  . Occupation: Pharmacist, hospital  Tobacco Use  . Smoking status: Never Smoker  . Smokeless tobacco: Never Used  Vaping Use  . Vaping Use: Never used  Substance and Sexual Activity  . Alcohol use: No  . Drug use: Never  . Sexual activity: Yes  Other Topics Concern  . Not on file  Social History Narrative   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  No alcohol no caffeine no drug use or other tobacco.   Social Determinants of Health   Financial Resource Strain: Not on file  Food Insecurity: Not on file  Transportation Needs: Not on file  Physical Activity: Not on file  Stress: Not  on file  Social Connections: Not on file  Intimate Partner Violence: Not on file     ROS- All systems are reviewed and negative except as per the HPI above.  Physical Exam: Vitals:   05/13/20 1317  BP: 102/66  Pulse: 65  Weight: 73.4 kg  Height: 5\' 5"  (1.651 m)    GEN- The patient is a well appearing female, alert and oriented x 3 today.   Head- normocephalic, atraumatic Eyes-  Sclera clear, conjunctiva pink Ears- hearing intact Oropharynx- clear Neck- supple  Lungs- Clear to ausculation bilaterally, normal work of breathing Heart- Regular rate and rhythm, no rubs or gallops, 2/6 systolic murmur GI- soft, NT, ND, + BS Extremities- no clubbing, cyanosis, or edema MS- no significant deformity or atrophy Skin- no rash or lesion Psych- euthymic mood, full affect Neuro- strength and sensation are intact  Wt Readings from Last 3 Encounters:  05/13/20 73.4 kg  04/20/20 72.6 kg  04/15/20 73.5 kg    EKG today demonstrates SR Vent. rate 65 BPM PR interval 120 ms QRS duration 94 ms QT/QTcB 410/426 ms  Epic records are reviewed  at length today  CHA2DS2-VASc Score = 3  The patient's score is based upon: CHF History: No HTN History: Yes Diabetes History: No Stroke History: No Vascular Disease History: No Age Score: 1 Gender Score: 1      ASSESSMENT AND PLAN: 1. Paroxysmal Atrial Fibrillation (ICD10:  I48.0) The patient's CHA2DS2-VASc score is 3, indicating a 3.2% annual risk of stroke.   General education about afib provided and questions answered. We also discussed her stroke risk and the risks and benefits of anticoagulation. We discussed therapeutic options including AAD vs ablation. She does not feel her afib warrants further intervention at this time and would like to continue with her primary cardiologist for now. If her afib becomes more persistent, she is interested in a referral to EP in [place] (Dr Quentin Ore).   Continue Xarelto 20 mg daily (Eliquis not covered by insurance)  2. Secondary Hypercoagulable State (ICD10:  D68.69) The patient is at significant risk for stroke/thromboembolism based upon her CHA2DS2-VASc Score of 3.  Continue Rivaroxaban (Xarelto).   3. HTN Stable, no changes today.   Follow up with primary cardiologist. AF clinic as needed.    [facility] [address] [place], [place] [address] [phone] 05/13/2020 2:18 PM

## 2020-05-18 ENCOUNTER — Other Ambulatory Visit (HOSPITAL_COMMUNITY)
Admission: RE | Admit: 2020-05-18 | Discharge: 2020-05-18 | Disposition: A | Discharge status: Home / Self Care | Payer: Medicare Other | Source: Ambulatory Visit | Attending: Internal Medicine | Admitting: Internal Medicine

## 2020-05-18 DIAGNOSIS — Z01812 Encounter for preprocedural laboratory examination: Secondary | ICD-10-CM | POA: Insufficient documentation

## 2020-05-18 DIAGNOSIS — Z20822 Contact with and (suspected) exposure to covid-19: Secondary | ICD-10-CM | POA: Diagnosis not present

## 2020-05-19 LAB — SARS CORONAVIRUS 2 (TAT 6-24 HRS): SARS Coronavirus 2: NEGATIVE

## 2020-05-20 ENCOUNTER — Ambulatory Visit (HOSPITAL_COMMUNITY)
Admission: RE | Admit: 2020-05-20 | Discharge: 2020-05-20 | Disposition: A | Discharge status: Home / Self Care | Payer: Medicare Other | Source: Ambulatory Visit | Attending: Internal Medicine | Admitting: Internal Medicine

## 2020-05-20 ENCOUNTER — Encounter (HOSPITAL_COMMUNITY)
Admission: RE | Disposition: A | Discharge status: Home / Self Care | Payer: Self-pay | Source: Ambulatory Visit | Attending: Internal Medicine

## 2020-05-20 DIAGNOSIS — R079 Chest pain, unspecified: Secondary | ICD-10-CM | POA: Diagnosis present

## 2020-05-20 SURGERY — INVASIVE LAB ABORTED CASE

## 2020-05-20 MED ORDER — LIDOCAINE VISCOUS HCL 2 % MT SOLN
OROMUCOSAL | Status: AC
  Filled 2020-05-20: qty 15

## 2020-05-20 SURGICAL SUPPLY — 2 items
FACESHIELD LNG OPTICON STERILE (SAFETY) IMPLANT
GLOVE BIO SURGEON STRL SZ8 (GLOVE) ×6 IMPLANT

## 2020-05-20 NOTE — Progress Notes (Signed)
Patient in to have mano with ph. Mano probe inserted x 2 without any difficulty per protocol. 2nd RN at bedside to provide emotional support. Mano probe twice was in correct position. Patient attempted to try to tolerate the probe second time. Tried to calm patient, however after about 2 minutes the patient started to cough and felt she wanted the probe out. Patient stated she just felt like something in throat and she could not tolerate the probe to have the test done. Probe removed per protocol. No blood noted on the probe or at Nare. Patient calm when out of room.

## 2020-05-21 ENCOUNTER — Telehealth: Payer: Self-pay | Admitting: Pulmonary Disease

## 2020-05-21 ENCOUNTER — Emergency Department: Payer: Medicare Other

## 2020-05-21 ENCOUNTER — Telehealth: Payer: Self-pay | Admitting: Internal Medicine

## 2020-05-21 ENCOUNTER — Emergency Department
Admission: EM | Admit: 2020-05-21 | Discharge: 2020-05-21 | Disposition: A | Discharge status: Home / Self Care | Payer: Medicare Other | Attending: Emergency Medicine | Admitting: Emergency Medicine

## 2020-05-21 ENCOUNTER — Other Ambulatory Visit: Payer: Self-pay

## 2020-05-21 DIAGNOSIS — Z79899 Other long term (current) drug therapy: Secondary | ICD-10-CM | POA: Diagnosis not present

## 2020-05-21 DIAGNOSIS — I129 Hypertensive chronic kidney disease with stage 1 through stage 4 chronic kidney disease, or unspecified chronic kidney disease: Secondary | ICD-10-CM | POA: Insufficient documentation

## 2020-05-21 DIAGNOSIS — N183 Chronic kidney disease, stage 3 unspecified: Secondary | ICD-10-CM | POA: Diagnosis not present

## 2020-05-21 DIAGNOSIS — R079 Chest pain, unspecified: Secondary | ICD-10-CM | POA: Diagnosis present

## 2020-05-21 DIAGNOSIS — I4891 Unspecified atrial fibrillation: Secondary | ICD-10-CM

## 2020-05-21 DIAGNOSIS — Z7901 Long term (current) use of anticoagulants: Secondary | ICD-10-CM | POA: Insufficient documentation

## 2020-05-21 DIAGNOSIS — Z20822 Contact with and (suspected) exposure to covid-19: Secondary | ICD-10-CM | POA: Insufficient documentation

## 2020-05-21 DIAGNOSIS — Z85528 Personal history of other malignant neoplasm of kidney: Secondary | ICD-10-CM | POA: Diagnosis not present

## 2020-05-21 LAB — CBC WITH DIFFERENTIAL/PLATELET
Abs Immature Granulocytes: 0.03 10*3/uL (ref 0.00–0.07)
Basophils Absolute: 0.1 10*3/uL (ref 0.0–0.1)
Basophils Relative: 1 %
Eosinophils Absolute: 0.2 10*3/uL (ref 0.0–0.5)
Eosinophils Relative: 3 %
HCT: 33.6 % — ABNORMAL LOW (ref 36.0–46.0)
Hemoglobin: 10.7 g/dL — ABNORMAL LOW (ref 12.0–15.0)
Immature Granulocytes: 0 %
Lymphocytes Relative: 31 %
Lymphs Abs: 2.6 10*3/uL (ref 0.7–4.0)
MCH: 27.8 pg (ref 26.0–34.0)
MCHC: 31.8 g/dL (ref 30.0–36.0)
MCV: 87.3 fL (ref 80.0–100.0)
Monocytes Absolute: 0.7 10*3/uL (ref 0.1–1.0)
Monocytes Relative: 8 %
Neutro Abs: 4.7 10*3/uL (ref 1.7–7.7)
Neutrophils Relative %: 57 %
Platelets: 267 10*3/uL (ref 150–400)
RBC: 3.85 MIL/uL — ABNORMAL LOW (ref 3.87–5.11)
RDW: 14 % (ref 11.5–15.5)
WBC: 8.3 10*3/uL (ref 4.0–10.5)
nRBC: 0 % (ref 0.0–0.2)

## 2020-05-21 LAB — BASIC METABOLIC PANEL
Anion gap: 9 (ref 5–15)
BUN: 31 mg/dL — ABNORMAL HIGH (ref 8–23)
CO2: 25 mmol/L (ref 22–32)
Calcium: 9.3 mg/dL (ref 8.9–10.3)
Chloride: 102 mmol/L (ref 98–111)
Creatinine, Ser: 1.34 mg/dL — ABNORMAL HIGH (ref 0.44–1.00)
GFR, Estimated: 44 mL/min — ABNORMAL LOW (ref >60)
Glucose, Bld: 113 mg/dL — ABNORMAL HIGH (ref 70–99)
Potassium: 3.7 mmol/L (ref 3.5–5.1)
Sodium: 136 mmol/L (ref 135–145)

## 2020-05-21 LAB — RESP PANEL BY RT-PCR (FLU A&B, COVID) ARPGX2
Influenza A by PCR: NEGATIVE
Influenza B by PCR: NEGATIVE
SARS Coronavirus 2 by RT PCR: NEGATIVE

## 2020-05-21 LAB — MAGNESIUM: Magnesium: 2 mg/dL (ref 1.7–2.4)

## 2020-05-21 LAB — TROPONIN I (HIGH SENSITIVITY): Troponin I (High Sensitivity): 28 ng/L — ABNORMAL HIGH (ref <18)

## 2020-05-21 MED ORDER — METOPROLOL TARTRATE 5 MG/5ML IV SOLN
5.0000 mg | INTRAVENOUS | Status: AC | PRN
  Administered 2020-05-21 (×3): 5 mg via INTRAVENOUS
  Filled 2020-05-21 (×2): qty 5

## 2020-05-21 MED ORDER — METOPROLOL TARTRATE 25 MG PO TABS
25.0000 mg | ORAL_TABLET | Freq: Once | ORAL | Status: AC
  Administered 2020-05-21: 25 mg via ORAL
  Filled 2020-05-21: qty 1

## 2020-05-21 MED ORDER — METOPROLOL TARTRATE 25 MG PO TABS: 25.0000 mg | ORAL_TABLET | Freq: Two times a day (BID) | ORAL | 1 refills | Status: DC

## 2020-05-21 NOTE — ED Notes (Signed)
Pt sts recent esophageal manometry at [facility], sts laid down tonight and had rapid heart rate at home, hx of a-fib RVR in past, took diltiazem PO at approx 2300 tonight

## 2020-05-21 NOTE — ED Triage Notes (Signed)
Pt states she is having chest pain that started around 2200, pt states she has a hx of a fib. She spoke with the cardiologist who told her to take 1 of her dilt pills and if the pain didn't stop to come into the ER. Pt states she has some sob. Pt states she had a test done on her esophagus today.

## 2020-05-21 NOTE — Telephone Encounter (Signed)
Spoke to patient, who stated that see was seen at ED for Afib.  She had a CXR and would like for Dr. Patsey Berthold to review this.  She is concerned about Bibasilar atelectasis. She reports of persistent cough. Cough is now proactive with tan sputum and has been present for weeks Denied fever, chills, sweats or additional sx.    Dr. Patsey Berthold, please advise. Thanks

## 2020-05-21 NOTE — Telephone Encounter (Signed)
Inbound call from patient. Wants it known that she have tried to do esophageal manometry twice and failed. Also went into [facility]. Patient is going to see her cardilogist

## 2020-05-21 NOTE — Telephone Encounter (Signed)
Patient is aware of below message/recommendations and voiced her understanding.  She recently started allergy drops with ENT.  Nothing further needed at this time.

## 2020-05-21 NOTE — Telephone Encounter (Signed)
The chest x-ray shows NO NEW CHANGES, from prior.  The findings described by the radiologist are extremely MILD.  Atelectasis happens when people do not take deep breaths.  I do not think that this is the cause for her cough.  She does have asthma and is on Nucala.  Allergens are up this time of the year and they have been very bad this year.  She can take some Zyrtec over-the-counter to see if this helps.

## 2020-05-21 NOTE — ED Provider Notes (Signed)
[facility] Emergency Department Provider Note   ____________________________________________   Event Date/Time   First MD Initiated Contact with Patient 05/21/20 0118     (approximate)  I have reviewed the triage vital signs and the nursing notes.   HISTORY  Chief Complaint Chest Pain    HPI Heidi Torres is a 65 y.o. female with past medical history of hypertension, hyperlipidemia, GERD, chronic kidney disease, and paroxysmal atrial fibrillation on Xarelto who presents to the ED complaining of chest pain.  Patient reports that around 10 PM this evening she suddenly started feeling like her heart was racing with a sensation of pressure in her chest.  She denies any associated shortness of breath, but has had frequent belching similar to her usual reflux symptoms.  She states that her atrial fibrillation is often brought on by reflux, she was scheduled to have esophageal manometry performed earlier today but could not tolerate the procedure.  She states that she had to hold her dose of Xarelto for the procedure, did take her evening dose around the time of onset of symptoms.  She does not take any rate control medication on a regular basis, does take diltiazem as needed for the symptoms.  She spoke with her cardiologist over the phone, who recommended she try a dose of diltiazem, but she decided to come to the ED after this did not help her symptoms.  She denies any recent fevers, cough, abdominal pain, nausea, vomiting, diarrhea, dysuria, or hematuria.        Past Medical History:  Diagnosis Date  . Asthma   . Atrial fibrillation ([place])   . Cataract cortical, senile   . Chronic kidney disease   . Complication of anesthesia   . Dysrhythmia   . Eosinophilic esophagitis   . Esophageal stricture   . GERD (gastroesophageal reflux disease)   . Headache    occular HA  . Heart murmur   . History of hiatal hernia   . Hx of dysplastic nevus    multiple  sites  . Hx of squamous cell carcinoma of skin 06/22/2015   R infrascapular, SCC in situ  . Hypercholesteremia   . Hypertension   . Irregular heart beat    pac's and pvc's  . Pneumonia    11-2016  . PONV (postoperative nausea and vomiting)   . Renal cell cancer, left ([place])   . Status post dilation of esophageal narrowing     Patient Active Problem List   Diagnosis Date Noted  . Paroxysmal atrial fibrillation ([place]) 05/13/2020  . Secondary hypercoagulable state ([place]) 05/13/2020  . Atrial fibrillation with RVR ([place]) 02/12/2020  . Mixed hyperlipidemia 01/28/2019  . Stage 3 chronic kidney disease ([place]) 09/25/2018  . Moderate persistent asthma 09/05/2018  . Eosinophilia 09/05/2018  . Essential hypertension 07/27/2018  . Perennial allergic rhinitis with seasonal variation 07/27/2018  . Pure hypercholesterolemia 07/27/2018  . Chemotherapy induced nausea and vomiting 07/01/2017  . Chemotherapy induced diarrhea 07/01/2017  . Acute renal insufficiency 07/01/2017  . Thyroid dysfunction 07/01/2017  . Renal cell carcinoma of left kidney ([place]) 03/21/2017  . Cancer of left kidney ([place]) 02/24/2017  . Esophagitis, reflux 10/21/2013    Past Surgical History:  Procedure Laterality Date  . ABDOMINAL HYSTERECTOMY    . ABLATION     prior to hysterectomy  . CYSTOSCOPY/RETROGRADE/URETEROSCOPY Left 01/20/2017   Procedure: CYSTOSCOPY/RETROGRADE/URETEROSCOPY/ BIOPSY,BLADDER BIOPSY PLACEMENT STENT LEFT URETER;  Surgeon: Festus Aloe, MD;  Location[facility];  Service: Urology;  Laterality:  Left;  . DILATION AND CURETTAGE OF UTERUS    . ESOPHAGOGASTRODUODENOSCOPY N/A 12/16/2019   Procedure: ESOPHAGOGASTRODUODENOSCOPY (EGD);  Surgeon: Lesly Rubenstein, MD;  Location: [facility] ENDOSCOPY;  Service: Endoscopy;  Laterality: N/A;  . ESOPHAGOGASTRODUODENOSCOPY (EGD) WITH PROPOFOL N/A 01/09/2019   Procedure: ESOPHAGOGASTRODUODENOSCOPY (EGD) WITH PROPOFOL;  Surgeon: Toledo, Benay Pike, MD;  Location: [facility]  ENDOSCOPY;  Service: Gastroenterology;  Laterality: N/A;  . NASAL ENDOSCOPY WITH EPISTAXIS CONTROL    . ROBOTIC ASSITED PARTIAL NEPHRECTOMY Left 02/24/2017   Procedure: XI ROBOTIC ASSITED LEFT  RADICAL NEPHRECTOMY;  Surgeon: Alexis Frock, MD;  Location: [facility];  Service: Urology;  Laterality: Left;  . TUBAL LIGATION      Prior to Admission medications   Medication Sig Start Date End Date Taking? Authorizing Provider  metoprolol tartrate (LOPRESSOR) 25 MG tablet Take 1 tablet (25 mg total) by mouth 2 (two) times daily. 05/21/20 07/20/20 Yes Blake Divine, MD  albuterol (VENTOLIN HFA) 108 (90 Base) MCG/ACT inhaler Inhale 2 puffs into the lungs every 4 (four) hours as needed.     [provider]  ALPRAZolam Duanne Moron) 0.25 MG tablet Take 0.25 mg by mouth at bedtime as needed for anxiety. Anxiety. 08/11/14   [provider]  atorvastatin (LIPITOR) 40 MG tablet Take 40 mg by mouth daily. 09/26/18   [provider]  Cholecalciferol (VITAMIN D3) 1000 units CAPS Take 1 capsule by mouth daily.    [provider]  EPINEPHrine 0.3 mg/0.3 mL IJ SOAJ injection See admin instructions. 04/22/20   [provider]  hydrALAZINE (APRESOLINE) 25 MG tablet Take 25 mg by mouth 2 (two) times daily. 04/11/20   [provider]  levalbuterol Penne Lash) 0.63 MG/3ML nebulizer solution Take 3 mLs (0.63 mg total) by nebulization every 4 (four) hours as needed for wheezing or shortness of breath. Use as needed if Xopenex inhaler provides insufficient relief of shortness of breath, wheezing, chest tightness 03/21/18 03/21/19  Wilhelmina Mcardle, MD  magnesium 30 MG tablet Take 30 mg by mouth 2 (two) times daily.    [provider]  Mepolizumab (NUCALA) 100 MG/ML SOAJ INJECT 1 PEN UNDER THE SKIN EVERY 28 DAYS 07/30/19   Tyler Pita, MD  olmesartan (BENICAR) 20 MG tablet Take 20 mg by mouth daily.    [provider]  potassium chloride (MICRO-K) 10 MEQ CR capsule  Take 20 mEq by mouth daily. 01/23/17   [provider]  rivaroxaban (XARELTO) 20 MG TABS tablet Take 1 tablet by mouth daily with breakfast. 02/20/20   [provider]  triamterene-hydrochlorothiazide (DYAZIDE) 37.5-25 MG per capsule Take 1 capsule by mouth daily. 07/10/14   [provider]  zafirlukast (ACCOLATE) 20 MG tablet TAKE 1 TABLET BY MOUTH TWICE A DAY 01/27/20   Tyler Pita, MD    Allergies Benzoin, Ciprofloxacin, and Steri-strip compound benzoin [benzoin compound]  Family History  Problem Relation Age of Onset  . Hypertension Brother   . Heart disease Mother   . Hyperlipidemia Mother   . Squamous cell carcinoma Father   . Heart disease Father   . Hypertension Father   . Hypertension Sister   . AAA (abdominal aortic aneurysm) Maternal Grandmother   . Lung cancer Maternal Grandfather        smoker    Social History Social History   Tobacco Use  . Smoking status: Never Smoker  . Smokeless tobacco: Never Used  Vaping Use  . Vaping Use: Never used  Substance Use Topics  . Alcohol  use: No  . Drug use: Never    Review of Systems  Constitutional: No fever/chills Eyes: No visual changes. ENT: No sore throat. Cardiovascular: Positive for palpitations and chest pain. Respiratory: Denies shortness of breath. Gastrointestinal: No abdominal pain.  No nausea, no vomiting.  No diarrhea.  No constipation. Genitourinary: Negative for dysuria. Musculoskeletal: Negative for back pain. Skin: Negative for rash. Neurological: Negative for headaches, focal weakness or numbness.  ____________________________________________   PHYSICAL EXAM:  VITAL SIGNS: ED Triage Vitals  Enc Vitals Group     BP 05/21/20 0119 (!) 151/93     Pulse Rate 05/21/20 0115 (!) 146     Resp 05/21/20 0116 18     Temp 05/21/20 0116 98.6 F (37 C)     Temp Source 05/21/20 0116 Oral     SpO2 05/21/20 0116 98 %     Weight 05/21/20 0113 160 lb (72.6 kg)     Height  05/21/20 0113 5\' 6"  (1.676 m)     Head Circumference --      Peak Flow --      Pain Score 05/21/20 0113 6     Pain Loc --      Pain Edu? --      Excl. in [place]? --     Constitutional: Alert and oriented. Eyes: Conjunctivae are normal. Head: Atraumatic. Nose: No congestion/rhinnorhea. Mouth/Throat: Mucous membranes are moist. Neck: Normal ROM Cardiovascular: Tachycardic, irregularly irregular rhythm. Grossly normal heart sounds.  2+ radial pulses bilaterally. Respiratory: Normal respiratory effort.  No retractions. Lungs CTAB. Gastrointestinal: Soft and nontender. No distention. Genitourinary: deferred Musculoskeletal: No lower extremity tenderness nor edema. Neurologic:  Normal speech and language. No gross focal neurologic deficits are appreciated. Skin:  Skin is warm, dry and intact. No rash noted. Psychiatric: Mood and affect are normal. Speech and behavior are normal.  ____________________________________________   LABS (all labs ordered are listed, but only abnormal results are displayed)  Labs Reviewed  CBC WITH DIFFERENTIAL/PLATELET - Abnormal; Notable for the following components:      Result Value   RBC 3.85 (*)    Hemoglobin 10.7 (*)    HCT 33.6 (*)    All other components within normal limits  BASIC METABOLIC PANEL - Abnormal; Notable for the following components:   Glucose, Bld 113 (*)    BUN 31 (*)    Creatinine, Ser 1.34 (*)    GFR, Estimated 44 (*)    All other components within normal limits  TROPONIN I (HIGH SENSITIVITY) - Abnormal; Notable for the following components:   Troponin I (High Sensitivity) 28 (*)    All other components within normal limits  RESP PANEL BY RT-PCR (FLU A&B, COVID) ARPGX2  MAGNESIUM  TROPONIN I (HIGH SENSITIVITY)   ____________________________________________  EKG  ED ECG REPORT I, Blake Divine, the attending physician, personally viewed and interpreted this ECG.   Date: 05/21/2020  EKG Time: 1:5  Rate: 141  Rhythm:  atrial fibrillation  Axis: LAD  Intervals:none  ST&T Change: None   PROCEDURES  Procedure(s) performed (including Critical Care):  Procedures   ____________________________________________   INITIAL IMPRESSION / ASSESSMENT AND PLAN / ED COURSE       65 year old female with past medical history of hypertension, hyperlipidemia, GERD, chronic kidney disease, and paroxysmal atrial fibrillation on Xarelto who presents to the ED for acute onset of palpitations and chest pain this evening.  EKG is consistent with atrial fibrillation with RVR, no ischemic changes noted.  We will attempt to control patient's  heart rate with metoprolol, unfortunately she is not a candidate for cardioversion given she had to hold Xarelto for a procedure earlier today.  We will check electrolytes and troponin, reassess following IV metoprolol.  Electrolytes unremarkable, troponin mildly elevated but I doubt ACS as patient reports chest pressure has now resolved with improvement in her heart rate.  Heart rate is now consistently between 95 and 105 but patient remains in atrial fibrillation following a total of 15 mg IV metoprolol.  She was given an oral dose of metoprolol to maintain control of heart rate.  Case discussed with Dr. Ubaldo Glassing of cardiology, who agrees with plan for discharge home and close follow-up with Dr. Call would.  We will start her on 25 mg of metoprolol twice daily and she was counseled to return to the ED for any new or worsening symptoms, patient agrees with plan.      ____________________________________________   FINAL CLINICAL IMPRESSION(S) / ED DIAGNOSES  Final diagnoses:  Atrial fibrillation with RVR [facility])     ED Discharge Orders         Ordered    metoprolol tartrate (LOPRESSOR) 25 MG tablet  2 times daily        05/21/20 0255           Note:  This document was prepared using Dragon voice recognition software and may include unintentional dictation errors.   Blake Divine, MD 05/21/20 [id number]

## 2020-06-09 ENCOUNTER — Telehealth: Payer: Self-pay

## 2020-06-09 NOTE — Telephone Encounter (Signed)
Faxed a Prior Authorization request to [facility] for [place]. Will update once we receive a response.   Fax# [phone] Phone# [phone]

## 2020-06-10 NOTE — Telephone Encounter (Signed)
Received notification from [facility] regarding a prior authorization for [place]. Authorization has been APPROVED from 06/09/2020 to 06/09/2021.   Authorization # [id number] [facility] Phone # [phone]

## 2020-06-17 ENCOUNTER — Telehealth: Payer: Self-pay

## 2020-06-17 ENCOUNTER — Ambulatory Visit (INDEPENDENT_AMBULATORY_CARE_PROVIDER_SITE_OTHER): Payer: Medicare Other | Admitting: Internal Medicine

## 2020-06-17 ENCOUNTER — Encounter: Payer: Self-pay | Admitting: Internal Medicine

## 2020-06-17 VITALS — BP 128/70 | HR 44 | Ht 65.0 in | Wt 159.8 lb

## 2020-06-17 DIAGNOSIS — I480 Paroxysmal atrial fibrillation: Secondary | ICD-10-CM | POA: Diagnosis not present

## 2020-06-17 DIAGNOSIS — J302 Other seasonal allergic rhinitis: Secondary | ICD-10-CM

## 2020-06-17 DIAGNOSIS — J3089 Other allergic rhinitis: Secondary | ICD-10-CM

## 2020-06-17 DIAGNOSIS — R079 Chest pain, unspecified: Secondary | ICD-10-CM | POA: Diagnosis not present

## 2020-06-17 DIAGNOSIS — K200 Eosinophilic esophagitis: Secondary | ICD-10-CM

## 2020-06-17 DIAGNOSIS — J392 Other diseases of pharynx: Secondary | ICD-10-CM

## 2020-06-17 DIAGNOSIS — Z7901 Long term (current) use of anticoagulants: Secondary | ICD-10-CM

## 2020-06-17 DIAGNOSIS — J4540 Moderate persistent asthma, uncomplicated: Secondary | ICD-10-CM | POA: Diagnosis not present

## 2020-06-17 NOTE — Progress Notes (Signed)
Heidi Torres 65 y.o. [birth year] [id number]  Assessment & Plan:   Encounter Diagnoses  Name Primary?   Eosinophilic esophagitis Yes   Chest pain, unspecified type    Paroxysmal atrial fibrillation (HCC)    Moderate persistent asthma, unspecified whether complicated    Perennial allergic rhinitis with seasonal variation    Long term current use of anticoagulant therapy    Hyperactive gag reflex     Refer to allergy Dr. Neldon Mc  I will discuss her A. fib issues and potential other treatment with an electrophysiologist  We have scheduled her for an EGD with Bravo pH probe.  I would hold Xarelto 2 days before and probably hold it for 2 days more.  She understands the rare but real risk of stroke off of this.  She is actually inclined to stop the Xarelto anyway though I have advised against that due to her CHA2DS2-VASc score of 3 and stroke risk.    I appreciate the opportunity to care for this patient. CC: [name], MD Dr. Allena Katz  Subjective:   Chief Complaint: Chest pain, GERD, EOE, linked to paroxysmal atrial fibrillation?  HPI Heidi Torres returns after having an unsuccessful attempt at an esophageal manometry and impedance study.  She gagged quite a bit and went into A. fib.  She had rapid ventricular response and was treated for that in the emergency department.  She has seen the PA from the A. fib clinic but did not get quite the evaluation I was hoping for I do not know if that was lack of understanding on her part or my part in terms of expressing the needs.  She was told about the watchmen device but the note looks like she was not too interested in pursuing aggressive therapy.  At any rate it was after that that she had the A. fib she is back on Xarelto still she would like to get off of that.  She is fairly convinced that esophageal reflux or problems like that will trigger her A. fib.  She is very aware when she is A. fib.  She has a follow-up phone call with  her cardiologist her Heidi Torres after she had an echocardiogram.  She was started on metoprolol because she had rapid ventricular response but in general she is bradycardic and her pulse is 44 here today.  She does not tolerate amiodarone.   Getting back to her GI symptoms she has had allergy work-ups that are not entirely clear to me with serology she has never had skin testing for food allergies.   She has had biopsies of the esophagus showing eosinophilic esophagitis by Dr. Alice Reichert.  She is not on any acid suppression or treatment for the eosinophilic esophagitis.  She has fairly significant asthma problems and she is on a biologic for that. Allergies  Allergen Reactions   Benzoin     Other reaction(s): Other (See Comments) Blisters   Ciprofloxacin Other (See Comments)    Arms were numb   Steri-Strip Compound Benzoin [Benzoin Compound] Other (See Comments)    Blisters   Current Meds  Medication Sig   albuterol (VENTOLIN HFA) 108 (90 Base) MCG/ACT inhaler Inhale 2 puffs into the lungs every 4 (four) hours as needed.    ALPRAZolam (XANAX) 0.25 MG tablet Take 0.25 mg by mouth at bedtime as needed for anxiety. Anxiety.   atorvastatin (LIPITOR) 40 MG tablet Take 40 mg by mouth daily.   Cholecalciferol (VITAMIN D3) 1000 units CAPS Take 1 capsule by  mouth daily.   EPINEPHrine 0.3 mg/0.3 mL IJ SOAJ injection See admin instructions.   magnesium 30 MG tablet Take 30 mg by mouth 2 (two) times daily.   Mepolizumab (NUCALA) 100 MG/ML SOAJ INJECT 1 PEN UNDER THE SKIN EVERY 28 DAYS   metoprolol tartrate (LOPRESSOR) 25 MG tablet Take 1 tablet (25 mg total) by mouth 2 (two) times daily. (Patient taking differently: Take 12.5 mg by mouth 2 (two) times daily.)   olmesartan (BENICAR) 20 MG tablet Take 20 mg by mouth daily.   potassium chloride (MICRO-K) 10 MEQ CR capsule Take 20 mEq by mouth daily.   rivaroxaban (XARELTO) 20 MG TABS tablet Take 1 tablet by mouth daily with breakfast.    triamterene-hydrochlorothiazide (DYAZIDE) 37.5-25 MG per capsule Take 1 capsule by mouth daily.   zafirlukast (ACCOLATE) 20 MG tablet TAKE 1 TABLET BY MOUTH TWICE A DAY   Past Medical History:  Diagnosis Date   Asthma    Atrial fibrillation (HCC)    Cataract cortical, senile    Chronic kidney disease    Complication of anesthesia    Dysrhythmia    Eosinophilic esophagitis    Esophageal stricture    GERD (gastroesophageal reflux disease)    Headache    occular HA   Heart murmur    History of hiatal hernia    Hx of dysplastic nevus    multiple sites   Hx of squamous cell carcinoma of skin 06/22/2015   R infrascapular, SCC in situ   Hypercholesteremia    Hypertension    Irregular heart beat    pac's and pvc's   Pneumonia    11-2016   PONV (postoperative nausea and vomiting)    Renal cell cancer, left (HCC)    Status post dilation of esophageal narrowing    Past Surgical History:  Procedure Laterality Date   ABDOMINAL HYSTERECTOMY     ABLATION     prior to hysterectomy   CYSTOSCOPY/RETROGRADE/URETEROSCOPY Left 01/20/2017   Procedure: CYSTOSCOPY/RETROGRADE/URETEROSCOPY/ BIOPSY,BLADDER BIOPSY PLACEMENT STENT LEFT URETER;  Surgeon: Festus Aloe, MD;  Location: [facility];  Service: Urology;  Laterality: Left;   DILATION AND CURETTAGE OF UTERUS     ESOPHAGOGASTRODUODENOSCOPY N/A 12/16/2019   Procedure: ESOPHAGOGASTRODUODENOSCOPY (EGD);  Surgeon: Lesly Rubenstein, MD;  Location[facility] ENDOSCOPY;  Service: Endoscopy;  Laterality: N/A;   ESOPHAGOGASTRODUODENOSCOPY (EGD) WITH PROPOFOL N/A 01/09/2019   Procedure: ESOPHAGOGASTRODUODENOSCOPY (EGD) WITH PROPOFOL;  Surgeon: Toledo, Benay Pike, MD;  Location: [facility] ENDOSCOPY;  Service: Gastroenterology;  Laterality: N/A;   NASAL ENDOSCOPY WITH EPISTAXIS CONTROL     ROBOTIC ASSITED PARTIAL NEPHRECTOMY Left 02/24/2017   Procedure: XI ROBOTIC ASSITED LEFT  RADICAL NEPHRECTOMY;  Surgeon: Alexis Frock, MD;  Location: [facility];  Service: Urology;   Laterality: Left;   TUBAL LIGATION     Social History   Social History Narrative   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  No alcohol no caffeine no drug use or other tobacco.   family history includes AAA (abdominal aortic aneurysm) in her maternal grandmother; Heart disease in her father and mother; Hyperlipidemia in her mother; Hypertension in her brother, father, and sister; Lung cancer in her maternal grandfather; Squamous cell carcinoma in her father.   Review of Systems As per HPI  Objective:   Physical Exam BP 128/70   Pulse (!) 44   Ht 5\' 5"  (1.651 m)   Wt 159 lb 12.8 oz (72.5 kg)   LMP  (LMP Unknown)   BMI  26.59 kg/m  WDWN NAD            Total time 62 minutes

## 2020-06-17 NOTE — Progress Notes (Signed)
Heidi Torres 65 y.o. 09-22-1955 [id number]  Assessment & Plan:    Bravo Ba swal Allergy kozlow  Talk to EP   Subjective:   Chief Complaint:  HPI  Allergies  Allergen Reactions   Benzoin     Other reaction(s): Other (See Comments) Blisters   Ciprofloxacin Other (See Comments)    Arms were numb   Steri-Strip Compound Benzoin [Benzoin Compound] Other (See Comments)    Blisters   Current Meds  Medication Sig   albuterol (VENTOLIN HFA) 108 (90 Base) MCG/ACT inhaler Inhale 2 puffs into the lungs every 4 (four) hours as needed.    ALPRAZolam (XANAX) 0.25 MG tablet Take 0.25 mg by mouth at bedtime as needed for anxiety. Anxiety.   atorvastatin (LIPITOR) 40 MG tablet Take 40 mg by mouth daily.   Cholecalciferol (VITAMIN D3) 1000 units CAPS Take 1 capsule by mouth daily.   EPINEPHrine 0.3 mg/0.3 mL IJ SOAJ injection See admin instructions.   magnesium 30 MG tablet Take 30 mg by mouth 2 (two) times daily.   Mepolizumab (NUCALA) 100 MG/ML SOAJ INJECT 1 PEN UNDER THE SKIN EVERY 28 DAYS   metoprolol tartrate (LOPRESSOR) 25 MG tablet Take 1 tablet (25 mg total) by mouth 2 (two) times daily. (Patient taking differently: Take 12.5 mg by mouth 2 (two) times daily.)   olmesartan (BENICAR) 20 MG tablet Take 20 mg by mouth daily.   potassium chloride (MICRO-K) 10 MEQ CR capsule Take 20 mEq by mouth daily.   rivaroxaban (XARELTO) 20 MG TABS tablet Take 1 tablet by mouth daily with breakfast.   triamterene-hydrochlorothiazide (DYAZIDE) 37.5-25 MG per capsule Take 1 capsule by mouth daily.   zafirlukast (ACCOLATE) 20 MG tablet TAKE 1 TABLET BY MOUTH TWICE A DAY   Past Medical History:  Diagnosis Date   Asthma    Atrial fibrillation (HCC)    Cataract cortical, senile    Chronic kidney disease    Complication of anesthesia    Dysrhythmia    Eosinophilic esophagitis    Esophageal stricture    GERD (gastroesophageal reflux disease)    Headache    occular HA   Heart murmur     History of hiatal hernia    Hx of dysplastic nevus    multiple sites   Hx of squamous cell carcinoma of skin 06/22/2015   R infrascapular, SCC in situ   Hypercholesteremia    Hypertension    Irregular heart beat    pac's and pvc's   Pneumonia    11-2016   PONV (postoperative nausea and vomiting)    Renal cell cancer, left (HCC)    Status post dilation of esophageal narrowing    Past Surgical History:  Procedure Laterality Date   ABDOMINAL HYSTERECTOMY     ABLATION     prior to hysterectomy   CYSTOSCOPY/RETROGRADE/URETEROSCOPY Left 01/20/2017   Procedure: CYSTOSCOPY/RETROGRADE/URETEROSCOPY/ BIOPSY,BLADDER BIOPSY PLACEMENT STENT LEFT URETER;  Surgeon: Festus Aloe, MD;  Location: [facility];  Service: Urology;  Laterality: Left;   DILATION AND CURETTAGE OF UTERUS     ESOPHAGOGASTRODUODENOSCOPY N/A 12/16/2019   Procedure: ESOPHAGOGASTRODUODENOSCOPY (EGD);  Surgeon: Lesly Rubenstein, MD;  Location: [facility] ENDOSCOPY;  Service: Endoscopy;  Laterality: N/A;   ESOPHAGOGASTRODUODENOSCOPY (EGD) WITH PROPOFOL N/A 01/09/2019   Procedure: ESOPHAGOGASTRODUODENOSCOPY (EGD) WITH PROPOFOL;  Surgeon: Toledo, Benay Pike, MD;  Location: [facility] ENDOSCOPY;  Service: Gastroenterology;  Laterality: N/A;   NASAL ENDOSCOPY WITH EPISTAXIS CONTROL     ROBOTIC ASSITED PARTIAL NEPHRECTOMY  Left 02/24/2017   Procedure: XI ROBOTIC ASSITED LEFT  RADICAL NEPHRECTOMY;  Surgeon: Alexis Frock, MD;  Location: [facility];  Service: Urology;  Laterality: Left;   TUBAL LIGATION     Social History   Social History Narrative   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  No alcohol no caffeine no drug use or other tobacco.   family history includes AAA (abdominal aortic aneurysm) in her maternal grandmother; Heart disease in her father and mother; Hyperlipidemia in her mother; Hypertension in her brother, father, and sister; Lung cancer in her maternal grandfather; Squamous cell carcinoma in  her father.   Review of Systems   Objective:   Physical Exam  BP 128/70   Pulse (!) 44   Ht 5\' 5"  (1.651 m)   Wt 159 lb 12.8 oz (72.5 kg)   LMP  (LMP Unknown)   BMI 26.59 kg/m

## 2020-06-17 NOTE — Telephone Encounter (Signed)
Anti-coag clearance letter faxed to Dr Toribio Harbour office and also sent thru Epic. Will await answer.

## 2020-06-17 NOTE — Patient Instructions (Addendum)
You have been scheduled for an endoscopy with Bravo.Please follow written instructions given to you at your visit today. If you use inhalers (even only as needed), please bring them with you on the day of your procedure.  The BravoT capsule test is a noninvasive test for evaluating heartburn or reflux symptoms related to gastroesophageal reflux disease (GERD). Damage caused by GERD can lead to more serious medical problems such as difficulty swallowing (dysphagia), narrowing of the esophagus (strictures), and Barrett's esophagus.  This test involves inserting a capsule the size of a long gel cap into the esophagus to measure the pH environment. Higher levels of pH in the esophagus indicate the presence of acid reflux. Your doctor will analyze results from the [place] test to determine what is causing your symptoms and which treatment to prescribe for you.  Patients with pacemakers, cardiac defibrillators, or diagnosed gastrointestinal obstructions or strictures should inform your physician.   Throughout the test period, which lasts 48 to 72 hours, the Bravo capsule will measure the pH in your esophagus and transmit this information to the Bravo reflux recorder, a small device that you will wear on your belt or waistband as you would a mobile phone. The capsule communicates with the recorder wirelessly, meaning that no tube or wire remains in your nose or throat.  The Bravo capsule not only measures the degree of acidity during the test period but also how often stomach acid flows into the lower esophagus.  After the capsule has been placed, you can  go about your normal activities. Some patients can feel the presence of the capsule, some do not. You will also be given a diary to note when you have reflux symptoms, when you eat and drink, and when you sleep or lie down.  Once the test has been completed, you will return the recorder and diary to the [place]. The data is then downloaded  to a computer program, which provides a comprehensive report. Your GI doctor will analyze the information and your symptoms to determining if you have acid reflux.  A day or two after the test is completed, the disposable capsule falls off the wall of your esophagus, harmlessly passes through your digestive tract, and is eliminated from your body through a bowel movement.  You should expect to receive the results of the Bravo study in approximately 2 weeks from returning the recorder.    For 7 days prior to your test do not take: Dexilant, Prevacid, Nexium, Protonix, Aciphex, Zegerid, Pantoprazole, Prilosec or Omeprazole.  For 7 days prior to your test, do not take: Reglan, Tagamet, Zantac, Axid or Pepcid.  You MAY use an antacid such as Rolaids or Tums up to 12 hours prior to your test.   You will be contaced by our office prior to your procedure for directions on holding your Xarelto.  If you do not hear from our office 1 week prior to your scheduled procedure, please call [phone] to discuss.    You will be contacted by [facility] in the next 2 days to arrange a Barium Swallow with tablet.  The number on your caller ID will be [phone], please answer when they call.  If you have not heard from them in 2 days please call [phone] to schedule.     We will place a referral for you to see Dr Allena Katz, phone # [phone]  Dr Carlean Purl is going to talk to the cardiac electrophysiologist about your issues.  I appreciate the opportunity to care for you. Silvano Rusk, MD, [facility]

## 2020-06-19 NOTE — Telephone Encounter (Signed)
We got approval to hold the xarelto for 2 days prior to her procedure in September from Dr Clayborn Bigness. Patient informed and verbalized understanding. Fax sent to be scanned into epic.

## 2020-06-25 ENCOUNTER — Other Ambulatory Visit: Payer: Self-pay

## 2020-06-25 ENCOUNTER — Ambulatory Visit (HOSPITAL_COMMUNITY)
Admission: RE | Admit: 2020-06-25 | Discharge: 2020-06-25 | Disposition: A | Discharge status: Home / Self Care | Payer: Medicare Other | Source: Ambulatory Visit | Attending: Internal Medicine | Admitting: Internal Medicine

## 2020-06-25 DIAGNOSIS — K200 Eosinophilic esophagitis: Secondary | ICD-10-CM | POA: Diagnosis not present

## 2020-06-26 ENCOUNTER — Encounter: Payer: Self-pay | Admitting: Allergy & Immunology

## 2020-06-26 ENCOUNTER — Ambulatory Visit (INDEPENDENT_AMBULATORY_CARE_PROVIDER_SITE_OTHER): Payer: Medicare Other | Admitting: Allergy & Immunology

## 2020-06-26 DIAGNOSIS — J310 Chronic rhinitis: Secondary | ICD-10-CM | POA: Diagnosis not present

## 2020-06-26 DIAGNOSIS — J4540 Moderate persistent asthma, uncomplicated: Secondary | ICD-10-CM | POA: Diagnosis not present

## 2020-06-26 DIAGNOSIS — K200 Eosinophilic esophagitis: Secondary | ICD-10-CM | POA: Diagnosis not present

## 2020-06-26 NOTE — Patient Instructions (Addendum)
1. Moderate persistent asthma, uncomplicated - Lung testing looks great. - We may discontinue your Nucala if you want to transition to [place]. - Continue with all of medications for your breathing. - I will update Dr. Ronalee Belts on our possible plan.  2. Eosinophilic esophagitis - Testing to foods was negative. - Food testing does not always correlate with the symptoms of EoE. - Sometimes, GI doctors will put patients on an avoidance diet to control the symptoms. - But I would consider adding on Dupixent to help with your EoE symptoms. - Information provided.   3. Chronic rhinitis - Testing was very reactive to dust mites but negative to cat and dog. - Continue with your drops and Claritin as needed.   4. Return in about 4 weeks (around 07/24/2020).    Please inform us of any Emergency Department visits, hospitalizations, or changes in symptoms. Call us before going to the ED for breathing or allergy symptoms since we might be able to fit you in for a sick visit. Feel free to contact us anytime with any questions, problems, or concerns.  It was a pleasure to meet you today!  Websites that have reliable patient information: 1. American Academy of Asthma, Allergy, and Immunology: www.aaaai.org 2. Food Allergy Research and Education (FARE): foodallergy.org 3. Mothers of Asthmatics: http://www.asthmacommunitynetwork.org 4. American College of Allergy, Asthma, and Immunology: www.acaai.org   COVID-19 Vaccine Information can be found at: ShippingScam.co.uk For questions related to vaccine distribution or appointments, please email [email or web address] or call [phone].   We realize that you might be concerned about having an allergic reaction to the COVID19 vaccines. To help with that concern, WE ARE OFFERING THE COVID19 VACCINES IN OUR OFFICE! Ask the front desk for dates!     "Like" [place] on Facebook and Instagram for our  latest updates!      A healthy democracy works best when [employer] participate! Make sure you are registered to vote! If you have moved or changed any of your contact information, you will need to get this updated before voting!  In some cases, you MAY be able to register to vote online: [email or web address]

## 2020-06-26 NOTE — Progress Notes (Signed)
NEW PATIENT  Date of Service/Encounter:  06/26/20  Consult requested by: [name], MD   Assessment:   Moderate persistent asthma, uncomplicated - currently on Nucala (pausing to start Dupixent)  Eosinophilic esophagitis - with > 40 eosinophils per high power field  Chronic rhinitis - on sublingual drops  Atrial fibrillation - on blood thinners  Stage 3b chronic kidney function   Ms. Corney's history was somewhat hard to follow because of her tendency to get sidetracked with almost a flight of ideas feel to her history of present illness.  She really wants to get off of the blood thinners for her atrial fibrillation, but I told her I was not going to make a call on that, as it would be too risky and I am not trained to do that.  I did tell her we can more aggressively treat her eosinophilic esophagitis with Dupixent, which was recently approved for treatment of this disorder.  We did talk about the side effects of Dupixent and I recommended that she stop her Nucala while on the [place] since insurance would likely not pay for both.  We can give the [place] time to work for her eosinophilic esophagitis and dysphagia with help from Dr. Carlean Purl.  She can then address the paroxysmal atrial fibrillation with her cardiologist.  I am going to check with her Nephrologist before adding on the [place] and I am going to reach out to the [facility] for [employer] to see if chronic kidney disease patients were involved in the trials of Dupixent and confirm clearance of the drug.  Regarding her allergic rhinitis, I did not really address that today since she is on allergy drops.  We discussed how allergy drops are not FDA approved for allergic rhinitis and often do not lead to the long-term changes seen in subcutaneous immunotherapy.  She did request that we add on a couple of perennial allergic rhinitis allergens including dust mite, cat, and dog.  She was only positive to the dust mites despite  telling me that she was highly reactive to cats and dogs.  Regardless, this is not changing her management as she continues to want the sublingual immunotherapy.   Plan/Recommendations:   1. Moderate persistent asthma, uncomplicated - Lung testing looks great. - We may discontinue your Nucala if you want to transition to [place]. - Continue with all of medications for your breathing. - I will update Dr. Ronalee Belts on our possible plan.  2. Eosinophilic esophagitis - Testing to foods was negative. - Food testing does not always correlate with the symptoms of EoE. - Sometimes, GI doctors will put patients on an avoidance diet to control the symptoms. - But I would consider adding on Dupixent to help with your EoE symptoms. - Information provided.   3. Chronic rhinitis - Testing was very reactive to dust mites but negative to cat and dog. - Continue with your drops and Claritin as needed.   4. Return in about 4 weeks (around 07/24/2020).     This note in its entirety was forwarded to the Provider who requested this consultation.  Subjective:   Heidi Torres is a 65 y.o. female presenting today for evaluation of  Chief Complaint  Patient presents with   Allergy Testing    Heidi Torres has a history of the following: Patient Active Problem List   Diagnosis Date Noted   Paroxysmal atrial fibrillation ([place]) 05/13/2020   Secondary hypercoagulable state ([place]) 05/13/2020   Atrial fibrillation with RVR ([place])  02/12/2020   Mixed hyperlipidemia 01/28/2019   Stage 3 chronic kidney disease ([place]) 09/25/2018   Moderate persistent asthma 09/05/2018   Eosinophilia 09/05/2018   Essential hypertension 07/27/2018   Perennial allergic rhinitis with seasonal variation 07/27/2018   Pure hypercholesterolemia 07/27/2018   Chemotherapy induced nausea and vomiting 07/01/2017   Chemotherapy induced diarrhea 07/01/2017   Acute renal insufficiency 07/01/2017   Thyroid dysfunction 07/01/2017    Renal cell carcinoma of left kidney ([place]) 03/21/2017   Cancer of left kidney ([place]) 02/24/2017   Esophagitis, reflux 10/21/2013    History obtained from: chart review and patient, who is not the best historian.  Lunette Stands Fernando was referred by [name], MD.     PCP: Dr. [name] Gastroenterologist: Dr. Silvano Rusk Cardiologist: Dr. Lujean Amel  Pulmonologist: Dr. Vernard Gambles Nephrologist: Dr. Anthonette Legato   Heidi Torres is a 65 y.o. female presenting for an evaluation of food allergies contributing to her eosinophilic esophagitis . She has a diagnosis of EoE. She is followed by Dr. Carlean Purl.    Asthma/Respiratory Symptom History: She is on zafirlukast (she was intolerant to montelukast evidently). She has been on Nucala.  She sees Dr. Patsey Berthold at [facility].  She had an asthma exacerbation in May 2022 and was seen in the urgent care.  She was started on prednisone and Tessalon Perles.  Allergic Rhinitis Symptom History: She is doing allergy drops from the ENT in [place]. She is supposed to take Claritin before the drops, but she does not always do that. Per review of records, she had multiple positives to grasses as well as other outdoor pollens as well as dust mites, cat, dog, and cockroach.  This is all obtained through Dr. Celesta Aver consult note from April 2022.  Food Allergy/EoE Symptom History: She reports that she has atrial fibrillation from certain foods. She wakes up in the middle of the night with her heart racing and chest pain. She sits up and starts "belching" like a "crazy person". She thinks that she heart irritation from the EoE. She underwent an esophageal manometry and failed this apparently. She also had a barium swallow that confirmed GERD. She had testing done by her ENT that showed an IgE to milk (0.25), cheese (0.12 and 0.17), egg (0.74), crab (0.14), shrimp (0.17), and lobster (0.10). It is unclear whether she made dietary changes  based on this.   She had an ALCAT done as well that demonstrated a slew of different sensitizations.  She did bring a copy of that testing with her today.  As anticipated at listed number of foods that she should avoid, she may need to put in this note.  I did explain that ALCAT testing is not reliable and therefore I cannot make any recommendations regarding her diet.  It seems that her initial eosinophilic esophagitis diagnosis was made in January 2021.  She was followed by Dr. Alice Reichert at that time of [facility].  Results of the biopsy are shown below.  She had a repeat anoscopy performed by Dr. Haig Prophet, of the same [facility], in December 2021.  This 1 was more normal.  It seems that she was treated with rabeprazole and Carafate during the fall 2021.  However, despite treatment, she continued to note a globus sensation with continued acid reflux and nausea.  Pathology from EGD (January 2021)  - Normal hypopharynx. - Esophageal mucosal changes suspicious for eosinophilic esophagitis. Biopsied. - Benign-appearing esophageal stenosis. Dilated. - Gastritis. Biopsied. - Multiple gastric polyps. Biopsied. - Normal  examined duodenum. - The examination was otherwise normal.   Pathology as below A. STOMACH, ANTRUM; COLD BIOPSY:  - GASTRIC ANTRAL MUCOSA WITH FEATURES OF REACTIVE GASTROPATHY.  - NEGATIVE FOR H. PYLORI, DYSPLASIA, AND MALIGNANCY.   B. STOMACH POLYPS, BODY; COLD BIOPSY:  - CHANGES SUGGESTIVE OF EARLY FUNDIC GLAND POLYPS.  - NEGATIVE FOR H. PYLORI, DYSPLASIA, AND MALIGNANCY.   C. ESOPHAGUS, PROXIMAL AND DISTAL; COLD BIOPSY:  - SQUAMOUS MUCOSA WITH ACANTHOSIS, SPONGIOSIS, AND MARKEDLY INCREASED  EOSINOPHILS (UP TO GREATER THAN 40 PER HPF), COMPATIBLE WITH CLINICAL  IMPRESSION OF EOSINOPHILIC ESOPHAGITIS.  - FOCAL GLANDULAR MUCOSA WITH NO SIGNIFICANT HISTOPATHOLOGIC CHANGE.  - NEGATIVE FOR INTESTINAL METAPLASIA, DYSPLASIA, AND MALIGNANCY.   She does have a history of  paroxysmal atrial fibrillation.  She is on blood thinners for this.  She feels that this is all secondary to eosinophilic esophagitis.  She did find a case report of this on the Internet and has latched on to this idea.  She wants to get off of her blood thinners.  She has a history of kidney cancer. She attributes this to her valsartan, which was made in [place]. Regardless, she does have a nephrologist.   Otherwise, there is no history of other atopic diseases, including drug allergies, stinging insect allergies, urticaria, or contact dermatitis. There is no significant infectious history. Vaccinations are up to date.    Past Medical History: Patient Active Problem List   Diagnosis Date Noted   Paroxysmal atrial fibrillation ([place]) 05/13/2020   Secondary hypercoagulable state ([place]) 05/13/2020   Atrial fibrillation with RVR ([place]) 02/12/2020   Mixed hyperlipidemia 01/28/2019   Stage 3 chronic kidney disease ([place]) 09/25/2018   Moderate persistent asthma 09/05/2018   Eosinophilia 09/05/2018   Essential hypertension 07/27/2018   Perennial allergic rhinitis with seasonal variation 07/27/2018   Pure hypercholesterolemia 07/27/2018   Chemotherapy induced nausea and vomiting 07/01/2017   Chemotherapy induced diarrhea 07/01/2017   Acute renal insufficiency 07/01/2017   Thyroid dysfunction 07/01/2017   Renal cell carcinoma of left kidney ([place]) 03/21/2017   Cancer of left kidney ([place]) 02/24/2017   Esophagitis, reflux 10/21/2013    Medication List:  Allergies as of 06/26/2020       Reactions   Benzoin    Other reaction(s): Other (See Comments) Blisters   Ciprofloxacin Other (See Comments)   Arms were numb   Steri-strip Compound Benzoin [benzoin Compound] Other (See Comments)   Blisters        Medication List        Accurate as of June 26, 2020 11:59 PM. If you have any questions, ask your nurse or doctor.          albuterol 108 (90 Base) MCG/ACT inhaler Commonly known as:  VENTOLIN HFA Inhale 2 puffs into the lungs every 4 (four) hours as needed.   albuterol (2.5 MG/3ML) 0.083% NEBU 3 mL, albuterol (5 MG/ML) 0.5% NEBU 0.5 mL Inhale into the lungs.   ALPRAZolam 0.25 MG tablet Commonly known as: XANAX Take 0.25 mg by mouth at bedtime as needed for anxiety. Anxiety.   atorvastatin 40 MG tablet Commonly known as: LIPITOR Take 40 mg by mouth daily.   diltiazem 30 MG tablet Commonly known as: CARDIZEM Take 30 mg by mouth 3 (three) times daily.   EPINEPHrine 0.3 mg/0.3 mL Soaj injection Commonly known as: EPI-PEN See admin instructions.   fluticasone 50 MCG/BLIST diskus inhaler Commonly known as: FLOVENT DISKUS Inhale 1 puff into the lungs 2 (two) times daily.   levalbuterol  0.63 MG/3ML nebulizer solution Commonly known as: XOPENEX Take 3 mLs (0.63 mg total) by nebulization every 4 (four) hours as needed for wheezing or shortness of breath. Use as needed if Xopenex inhaler provides insufficient relief of shortness of breath, wheezing, chest tightness   magnesium 30 MG tablet Take 30 mg by mouth 2 (two) times daily.   metoprolol tartrate 25 MG tablet Commonly known as: LOPRESSOR Take 1 tablet (25 mg total) by mouth 2 (two) times daily.   Nucala 100 MG/ML Soaj Generic drug: Mepolizumab INJECT 1 PEN UNDER THE SKIN EVERY 28 DAYS   olmesartan 20 MG tablet Commonly known as: BENICAR Take 20 mg by mouth daily.   potassium chloride 10 MEQ CR capsule Commonly known as: MICRO-K Take 20 mEq by mouth daily.   rivaroxaban 20 MG Tabs tablet Commonly known as: XARELTO Take 1 tablet by mouth daily with breakfast.   triamterene-hydrochlorothiazide 37.5-25 MG capsule Commonly known as: DYAZIDE Take 1 capsule by mouth daily.   Vitamin D3 25 MCG (1000 UT) Caps Take 1 capsule by mouth daily.   zafirlukast 20 MG tablet Commonly known as: ACCOLATE TAKE 1 TABLET BY MOUTH TWICE A DAY        Birth History: non-contributory  Developmental History:  non-contributory  Past Surgical History: Past Surgical History:  Procedure Laterality Date   ABDOMINAL HYSTERECTOMY     ABLATION     prior to hysterectomy   CYSTOSCOPY/RETROGRADE/URETEROSCOPY Left 01/20/2017   Procedure: CYSTOSCOPY/RETROGRADE/URETEROSCOPY/ BIOPSY,BLADDER BIOPSY PLACEMENT STENT LEFT URETER;  Surgeon: Festus Aloe, MD;  Location: [facility];  Service: Urology;  Laterality: Left;   DILATION AND CURETTAGE OF UTERUS     ESOPHAGOGASTRODUODENOSCOPY N/A 12/16/2019   Procedure: ESOPHAGOGASTRODUODENOSCOPY (EGD);  Surgeon: Lesly Rubenstein, MD;  Location: [facility] ENDOSCOPY;  Service: Endoscopy;  Laterality: N/A;   ESOPHAGOGASTRODUODENOSCOPY (EGD) WITH PROPOFOL N/A 01/09/2019   Procedure: ESOPHAGOGASTRODUODENOSCOPY (EGD) WITH PROPOFOL;  Surgeon: Toledo, Benay Pike, MD;  Location: [facility] ENDOSCOPY;  Service: Gastroenterology;  Laterality: N/A;   NASAL ENDOSCOPY WITH EPISTAXIS CONTROL     ROBOTIC ASSITED PARTIAL NEPHRECTOMY Left 02/24/2017   Procedure: XI ROBOTIC ASSITED LEFT  RADICAL NEPHRECTOMY;  Surgeon: Alexis Frock, MD;  Location: [facility];  Service: Urology;  Laterality: Left;   TUBAL LIGATION       Family History: Family History  Problem Relation Age of Onset   Hypertension Brother    Heart disease Mother    Hyperlipidemia Mother    Squamous cell carcinoma Father    Heart disease Father    Hypertension Father    Hypertension Sister    AAA (abdominal aortic aneurysm) Maternal Grandmother    Lung cancer Maternal Grandfather        smoker     Social History: Treena lives at home in a that is 66 years old.  There is wood throughout the home.  She has gas electric heating with central cooling and a heat pump.  There are birds and dogs inside the house.  There are chickens About a cup of the house.  There are dust mite covers on the bedding.  There is no tobacco exposure.  She currently is a Pharmacist, hospital.  She is not exposed to fumes, chemicals, or dust.   Review of Systems   Constitutional:  Positive for malaise/fatigue. Negative for chills, fever and weight loss.  HENT: Negative.  Negative for congestion, ear discharge, ear pain and sore throat.   Eyes:  Negative for pain, discharge and redness.  Respiratory:  Positive for cough.  Negative for sputum production, shortness of breath and wheezing.   Cardiovascular:  Positive for palpitations. Negative for chest pain.  Gastrointestinal:  Positive for abdominal pain, heartburn and nausea. Negative for diarrhea and vomiting.  Skin: Negative.  Negative for itching and rash.  Neurological:  Negative for dizziness and headaches.  Endo/Heme/Allergies:  Negative for environmental allergies. Does not bruise/bleed easily.      Objective:   Vital signs reviewed.   Physical Exam:   Physical Exam Constitutional:      Appearance: She is well-developed.     Comments: Anxious. Talkative. Flight of ideas.  HENT:     Head: Normocephalic and atraumatic.     Right Ear: Tympanic membrane, ear canal and external ear normal. No drainage, swelling or tenderness. Tympanic membrane is not injected, scarred, erythematous, retracted or bulging.     Left Ear: Tympanic membrane, ear canal and external ear normal. No drainage, swelling or tenderness. Tympanic membrane is not injected, scarred, erythematous, retracted or bulging.     Nose: No nasal deformity, septal deviation, mucosal edema or rhinorrhea.     Right Turbinates: Not enlarged or swollen.     Left Turbinates: Not enlarged or swollen.     Right Sinus: No maxillary sinus tenderness or frontal sinus tenderness.     Left Sinus: No maxillary sinus tenderness or frontal sinus tenderness.     Mouth/Throat:     Mouth: Mucous membranes are not pale and not dry.     Pharynx: Uvula midline.  Eyes:     General:        Right eye: No discharge.        Left eye: No discharge.     Conjunctiva/sclera: Conjunctivae normal.     Right eye: Right conjunctiva is not injected. No  chemosis.    Left eye: Left conjunctiva is not injected. No chemosis.    Pupils: Pupils are equal, round, and reactive to light.  Cardiovascular:     Rate and Rhythm: Normal rate and regular rhythm.     Heart sounds: Normal heart sounds.  Pulmonary:     Effort: Pulmonary effort is normal. No tachypnea, accessory muscle usage or respiratory distress.     Breath sounds: Normal breath sounds. No wheezing, rhonchi or rales.     Comments: Moving air well in all lung fields. No increased work of breathing noted.  Chest:     Chest wall: No tenderness.  Abdominal:     Tenderness: There is no abdominal tenderness. There is no guarding or rebound.  Lymphadenopathy:     Head:     Right side of head: No submandibular, tonsillar or occipital adenopathy.     Left side of head: No submandibular, tonsillar or occipital adenopathy.     Cervical: No cervical adenopathy.  Skin:    General: Skin is warm.     Capillary Refill: Capillary refill takes less than 2 seconds.     Coloration: Skin is not pale.     Findings: No abrasion, erythema, petechiae or rash. Rash is not papular, urticarial or vesicular.  Neurological:     Mental Status: She is alert.  Psychiatric:        Behavior: Behavior is cooperative.     Diagnostic studies:   Spirometry: results abnormal (FEV1: 1.64/66%, FVC: 2.29/71%, FEV1/FVC: 72%).    Spirometry consistent with possible restrictive disease.    Allergy Studies:    Full food panel: Negative to all items on the panel with adequate controls  Selected indoor allergens:  Positive to dust mite, otherwise negative to cat and dog with adequate controls        Salvatore Marvel, MD [facility] [place] [facility] [place]

## 2020-06-30 ENCOUNTER — Encounter: Payer: Self-pay | Admitting: Allergy & Immunology

## 2020-07-08 ENCOUNTER — Telehealth: Payer: Self-pay | Admitting: *Deleted

## 2020-07-08 NOTE — Telephone Encounter (Signed)
Called patient and advised working on approval for [place] for her EOE and will reach out once approved to advised submit and copay card

## 2020-07-08 NOTE — Telephone Encounter (Signed)
-----   Message from Valentina Shaggy, MD sent at 06/30/2020  6:19 AM EDT ----- New start [place] for EoE

## 2020-07-10 ENCOUNTER — Encounter: Payer: Self-pay | Admitting: *Deleted

## 2020-07-20 NOTE — Telephone Encounter (Signed)
Called patient and advised approval and submit to [employer] with Instruction for copay card, storage and appt in office for initial start

## 2020-07-21 ENCOUNTER — Ambulatory Visit: Payer: Medicare Other | Admitting: Allergy & Immunology

## 2020-07-28 ENCOUNTER — Other Ambulatory Visit: Payer: Self-pay | Admitting: Pulmonary Disease

## 2020-07-30 ENCOUNTER — Ambulatory Visit: Payer: Medicare Other | Admitting: *Deleted

## 2020-07-30 ENCOUNTER — Ambulatory Visit: Payer: Medicare Other

## 2020-07-30 ENCOUNTER — Other Ambulatory Visit: Payer: Self-pay

## 2020-07-30 DIAGNOSIS — K200 Eosinophilic esophagitis: Secondary | ICD-10-CM

## 2020-07-30 NOTE — Progress Notes (Signed)
Immunotherapy   Patient Details  Name: Heidi Torres MRN: [id number] Date of Birth: [birth year]  07/30/2020  Lunette Stands Sachse started injections for  Dupixent Followi Frequency: Once Weekly  Epi-Pen: Not Required Consent signed and patient instructions given.  Patient started Dupixent today and received '300mg'$  in the Left Umbilicus by her husband so that she can continue injections at home. Patient waited 30 minutes in office and did not experience any issues.   Louetta Hollingshead Fernandez-Vernon 07/30/2020, 5:06 PM

## 2020-08-10 ENCOUNTER — Encounter: Payer: Self-pay | Admitting: Obstetrics and Gynecology

## 2020-08-10 ENCOUNTER — Ambulatory Visit (INDEPENDENT_AMBULATORY_CARE_PROVIDER_SITE_OTHER): Payer: Medicare Other | Admitting: Obstetrics and Gynecology

## 2020-08-10 ENCOUNTER — Other Ambulatory Visit: Payer: Self-pay

## 2020-08-10 VITALS — BP 114/75 | HR 59 | Ht 66.0 in | Wt 155.0 lb

## 2020-08-10 DIAGNOSIS — N3281 Overactive bladder: Secondary | ICD-10-CM | POA: Diagnosis not present

## 2020-08-10 DIAGNOSIS — K5900 Constipation, unspecified: Secondary | ICD-10-CM | POA: Diagnosis not present

## 2020-08-10 DIAGNOSIS — R350 Frequency of micturition: Secondary | ICD-10-CM | POA: Diagnosis not present

## 2020-08-10 DIAGNOSIS — N393 Stress incontinence (female) (male): Secondary | ICD-10-CM | POA: Diagnosis not present

## 2020-08-10 DIAGNOSIS — M62838 Other muscle spasm: Secondary | ICD-10-CM

## 2020-08-10 LAB — POCT URINALYSIS DIPSTICK
Appearance: NORMAL
Bilirubin, UA: NEGATIVE
Blood, UA: NEGATIVE
Glucose, UA: NEGATIVE
Ketones, UA: NEGATIVE
Nitrite, UA: NEGATIVE
Protein, UA: NEGATIVE
Spec Grav, UA: 1.025 (ref 1.010–1.025)
Urobilinogen, UA: 0.2 E.U./dL
pH, UA: 5.5 (ref 5.0–8.0)

## 2020-08-10 NOTE — Progress Notes (Signed)
[facility] New Patient Evaluation and Consultation  Referring Provider: Gae Dry, MD PCP: [name], MD Date of Service: 08/10/2020  SUBJECTIVE Chief Complaint: New Patient (Initial Visit)- urinary leakage  History of Present Illness: CHERRON DICKMEYER is a 65 y.o. White or Caucasian female seen in consultation at the request of Dr. Kenton Kingfisher for evaluation of incontinence.    Review of records from Dr Kenton Kingfisher significant for: Has a history of TVH and sling. Now has mild urine leakage with stress and urgency.   Urinary Symptoms: Leaks urine with cough/ sneeze, laughing, exercise, lifting, going from sitting to standing, with a full bladder, with movement to the bathroom, with urgency, and without sensation Leaks 2+ time(s) per day.  Pad use: 2 liners/ mini-pads per day.   She is bothered by her UI symptoms.  Day time voids 3.  Nocturia: 1-2 times per night to void. Voiding dysfunction: she empties her bladder well.  does not use a catheter to empty bladder.  When urinating, she feels dribbling after finishing  UTIs: 2 UTI's in the last year.   Denies history of blood in urine and kidney or bladder stones.  History of Renal Cancer with left nephrectomy   Pelvic Organ Prolapse Symptoms:                  She Denies a feeling of a bulge the vaginal area.   Bowel Symptom: Bowel movements: 1-2 time(s) per week Stool consistency: hard, soft , or loose Straining: yes.  Splinting: yes.  Incomplete evacuation: no.  She Admits to accidental bowel leakage / fecal incontinence  Occurs: 1 time(s) per week  Consistency with leakage: soft  Bowel regimen: none   Sexual Function Sexually active: no.   Pelvic Pain Denies pelvic pain    Past Medical History:  Past Medical History:  Diagnosis Date   Asthma    Atrial fibrillation (HCC)    Cataract cortical, senile    Chronic kidney disease    Complication of anesthesia    Dysrhythmia    Eosinophilic  esophagitis    Esophageal stricture    GERD (gastroesophageal reflux disease)    Headache    occular HA   Heart murmur    History of hiatal hernia    Hx of dysplastic nevus    multiple sites   Hx of squamous cell carcinoma of skin 06/22/2015   R infrascapular, SCC in situ   Hypercholesteremia    Hypertension    Irregular heart beat    pac's and pvc's   Pneumonia    11-2016   PONV (postoperative nausea and vomiting)    Renal cell cancer, left (HCC)    Status post dilation of esophageal narrowing      Past Surgical History:   Past Surgical History:  Procedure Laterality Date   ABDOMINAL HYSTERECTOMY     ABLATION     prior to hysterectomy   CYSTOSCOPY/RETROGRADE/URETEROSCOPY Left 01/20/2017   Procedure: CYSTOSCOPY/RETROGRADE/URETEROSCOPY/ BIOPSY,BLADDER BIOPSY PLACEMENT STENT LEFT URETER;  Surgeon: Festus Aloe, MD;  Location: [facility];  Service: Urology;  Laterality: Left;   DILATION AND CURETTAGE OF UTERUS     ESOPHAGOGASTRODUODENOSCOPY N/A 12/16/2019   Procedure: ESOPHAGOGASTRODUODENOSCOPY (EGD);  Surgeon: Lesly Rubenstein, MD;  Location: [facility] END[facility]COPY;  Service: Endoscopy;  Laterality: N/A;   ESOPHAGOGASTRODUODENOSCOPY (EGD) WITH PROPOFOL N/A 01/09/2019   Procedure: ESOPHAGOGASTRODUODENOSCOPY (EGD) WITH PROPOFOL;  Surgeon: Toledo, Benay Pike, MD;  Location: [facility] ENDOSCOPY;  Service: Gastroenterology;  Laterality: N/A;   NASAL ENDOSCOPY  WITH EPISTAXIS CONTROL     ROBOTIC ASSITED PARTIAL NEPHRECTOMY Left 02/24/2017   Procedure: XI ROBOTIC ASSITED LEFT  RADICAL NEPHRECTOMY;  Surgeon: Alexis Frock, MD;  Location: [facility];  Service: Urology;  Laterality: Left;   TUBAL LIGATION       Past OB/GYN History: OB History     Gravida  4   Para  3   Term  3   Preterm  0   AB  1   Living  3      SAB  1   IAB      Ectopic      Multiple      Live Births  3          SVD x3 S/p hysterectomy   Medications: She has a current medication list which includes  the following prescription(s): albuterol (2.5 MG/3ML) 0.083% NEBU 3 mL, albuterol (5 MG/ML) 0.5% NEBU 0.5 mL, albuterol, alprazolam, atorvastatin, vitamin d3, epinephrine, magnesium, nucala, olmesartan, potassium chloride, rivaroxaban, triamterene-hydrochlorothiazide, zafirlukast, levalbuterol, and metoprolol tartrate.   Allergies: Patient is allergic to benzoin, ciprofloxacin, and steri-strip compound benzoin [benzoin compound].   Social History:  Social History   Tobacco Use   Smoking status: Never   Smokeless tobacco: Never  Vaping Use   Vaping Use: Never used  Substance Use Topics   Alcohol use: No   Drug use: Never     Family History:   Family History  Problem Relation Age of Onset   Hypertension Brother    Heart disease Mother    Hyperlipidemia Mother    Squamous cell carcinoma Father    Heart disease Father    Hypertension Father    Hypertension Sister    AAA (abdominal aortic aneurysm) Maternal Grandmother    Lung cancer Maternal Grandfather        smoker     Review of Systems: Review of Systems  Constitutional:  Negative for fever, malaise/fatigue and weight loss.  Respiratory:  Positive for wheezing. Negative for cough and shortness of breath.   Cardiovascular:  Positive for palpitations. Negative for chest pain and leg swelling.  Gastrointestinal:  Negative for abdominal pain and blood in stool.  Genitourinary:  Negative for dysuria.  Musculoskeletal:  Negative for myalgias.  Skin:  Negative for rash.  Neurological:  Positive for headaches. Negative for dizziness.  Endo/Heme/Allergies:  Bruises/bleeds easily.  Psychiatric/Behavioral:  Negative for depression. The patient is not nervous/anxious.     OBJECTIVE Physical Exam: Vitals:   08/10/20 1336  BP: 114/75  Pulse: (!) 59  Weight: 155 lb (70.3 kg)  Height: '5\' 6"'$  (1.676 m)    Physical Exam Constitutional:      General: She is not in acute distress. Pulmonary:     Effort: Pulmonary effort is  normal.  Abdominal:     General: There is no distension.     Palpations: Abdomen is soft.     Tenderness: There is no abdominal tenderness. There is no rebound.  Musculoskeletal:        General: No swelling. Normal range of motion.  Skin:    General: Skin is warm and dry.     Findings: No rash.  Neurological:     Mental Status: She is alert and oriented to person, place, and time.  Psychiatric:        Mood and Affect: Mood normal.        Behavior: Behavior normal.     GU / Detailed Urogynecologic Evaluation:  Pelvic Exam: Normal external female genitalia;  Bartholin's and Skene's glands normal in appearance; urethral meatus normal in appearance, no urethral masses or discharge.   CST: negative  s/p hysterectomy: Speculum exam reveals normal vaginal mucosa with  atrophy and normal vaginal cuff.  Adnexa no mass, fullness, tenderness.    Pelvic floor strength II/V  Pelvic floor musculature: Right levator tender, Right obturator tender, Left levator non-tender, Left obturator non-tender  POP-Q:   POP-Q  -2                                            Aa   -2                                           Ba  -6                                              C   2                                            Gh  3                                            Pb  7.5                                            tvl   -1.5                                            Ap  -1.5                                            Bp                                                 D     Rectal Exam:  Normal external rectum. No masses present. Slow to relax pelvic floor muscles with straining  Post-Void Residual (PVR) by Bladder Scan: In order to evaluate bladder emptying, we discussed obtaining a postvoid residual and she agreed to this procedure.  Procedure: The ultrasound unit was placed on the patient's abdomen in the suprapubic region after the patient had voided. A PVR of 19 ml was  obtained by bladder scan.  Laboratory Results: POC urine: small leukocytes  ASSESSMENT AND PLAN Ms. Smolinski is a 65 y.o. with:  1. SUI (stress urinary incontinence, female)   2. Urinary frequency  3. Overactive bladder   4. Constipation, unspecified constipation type   5. Levator spasm    SUI For treatment of stress urinary incontinence,  non-surgical options include expectant management, weight loss, physical therapy, as well as a pessary.  Surgical options include a midurethral sling, Burch urethropexy, and transurethral injection of a bulking agent. - She would like to start with physical therapy, referral placed  2. OAB We discussed the symptoms of overactive bladder (OAB), which include urinary urgency, urinary frequency, nocturia, with or without urge incontinence.  While we do not know the exact etiology of OAB, several treatment options exist. We discussed management including behavioral therapy (decreasing bladder irritants, urge suppression strategies, timed voids, bladder retraining), physical therapy, medication. - physical therapy  3. Constipation - recommended good bowel regimen with fiber, miralax or stool softener. Likely impacted by pelvic floor dyssynergia and can benefit from PT  4. Levator spasm The origin of pelvic floor muscle spasm can be multifactorial, including primary, reactive to a different pain source, trauma, or even part of a centralized pain syndrome. - Will start with PT then reassess.   Return 2 months  Jaquita Folds, MD   Medical Decision Making:  - Reviewed/ ordered a clinical laboratory test - Review and summation of prior records

## 2020-08-10 NOTE — Patient Instructions (Signed)
For treatment of stress urinary incontinence, which is leakage with physical activity/movement/strainging/coughing, we discussed expectant management versus nonsurgical options versus surgery. Nonsurgical options include weight loss, physical therapy, as well as a pessary.  Surgical options include a midurethral sling, which is a synthetic mesh sling that acts like a hammock under the urethra to prevent leakage of urine, and transurethral injection of a bulking agent.  We discussed the symptoms of overactive bladder (OAB), which include urinary urgency, urinary frequency, night-time urination, with or without urge incontinence.  We discussed management including behavioral therapy (decreasing bladder irritants by following a bladder diet, urge suppression strategies, timed voids, bladder retraining), physical therapy, medication; and for refractory cases posterior tibial nerve stimulation, sacral neuromodulation, and intravesical botulinum toxin injection.

## 2020-08-17 NOTE — Progress Notes (Signed)
Patient is scheduled for 09/17/20 with Dr Ernst Bowler

## 2020-08-17 NOTE — Progress Notes (Signed)
Left voicemail for patient to call back to schedule an appointment.

## 2020-08-18 ENCOUNTER — Telehealth: Payer: Self-pay

## 2020-08-18 DIAGNOSIS — I480 Paroxysmal atrial fibrillation: Secondary | ICD-10-CM

## 2020-08-18 NOTE — Telephone Encounter (Signed)
-----   Message from Gatha Mayer, MD sent at 08/17/2020  4:53 PM EDT ----- Regarding: Needs referral to an appointment with electrophysiologist You can see this lady's MyChart message and my previous notes.  I had sent her to EP before but she saw an app who did not really understand why I sent her.  She has A. fib that is triggered by GI issues and got triggered by an attempt at a manometry and impedance test.  I spoke to Dr. Rayann Heman and he said that they see these sort of things.  It sounds to me like she might benefit from an EP study.  I would like her to see one of the electrophysiologists regarding palpitations, A. fib with GI triggers please evaluate and treat

## 2020-08-18 NOTE — Telephone Encounter (Signed)
Referral placed for EP- Dr. Rayann Heman

## 2020-08-19 NOTE — Progress Notes (Signed)
[facility]   Subjective:     Chief Complaint: No chief complaint on file.  History of Present Illness: Heidi Torres is a 65 y.o. female with stress incontinence and OAB who presents today for a pessary fitting.    Past Medical History: Patient  has a past medical history of Asthma, Atrial fibrillation ([place]), Cataract cortical, senile, Chronic kidney disease, Complication of anesthesia, Dysrhythmia, Eosinophilic esophagitis, Esophageal stricture, GERD (gastroesophageal reflux disease), Headache, Heart murmur, History of hiatal hernia, dysplastic nevus, squamous cell carcinoma of skin (06/22/2015), Hypercholesteremia, Hypertension, Irregular heart beat, Pneumonia, PONV (postoperative nausea and vomiting), Renal cell cancer, left ([place]), and Status post dilation of esophageal narrowing.   Past Surgical History: She  has a past surgical history that includes Nasal endoscopy with epistaxis control; Tubal ligation; Dilation and curettage of uterus; Ablation; Cystoscopy/retrograde/ureteroscopy (Left, 01/20/2017); Abdominal hysterectomy; Robotic assited partial nephrectomy (Left, 02/24/2017); Esophagogastroduodenoscopy (egd) with propofol (N/A, 01/09/2019); and Esophagogastroduodenoscopy (N/A, 12/16/2019).   Medications: She has a current medication list which includes the following prescription(s): albuterol (2.5 MG/3ML) 0.083% NEBU 3 mL, albuterol (5 MG/ML) 0.5% NEBU 0.5 mL, albuterol, alprazolam, atorvastatin, vitamin d3, epinephrine, levalbuterol, magnesium, nucala, metoprolol tartrate, olmesartan, potassium chloride, rivaroxaban, triamterene-hydrochlorothiazide, and zafirlukast.   Allergies: Patient is allergic to benzoin, ciprofloxacin, and steri-strip compound benzoin [benzoin compound].   Social History: Patient  reports that she has never smoked. She has never used smokeless tobacco. She reports that she does not drink alcohol and does not use drugs.      Objective:     LMP  (LMP Unknown)  Gen: No apparent distress, A&O x 3. Pelvic Exam: Normal external female genitalia; Bartholin's and Skene's glands normal in appearance; urethral meatus normal in appearance, no urethral masses or discharge.   A size #0 and #1 incontinence ring was fitted. Upon standing these were both dropping to the introitus and uncomfortable. A size #1 incontinence dish pessary was fitted. It was comfortable, stayed in place with valsalva, standing and bending and was an appropriate size on examination, with one finger fitting between the pessary and the vaginal walls. Lot # [id number], Exp 06/03/20.  POP-Q (08/10/20):    POP-Q   -2                                            Aa   -2                                           Ba   -6                                              C    2                                            Gh   3  Pb   7.5                                            tvl    -1.5                                            Ap   -1.5                                            Bp                                                  D        Assessment/Plan:    Assessment: Heidi Torres is a 65 y.o. with stress incontinence and OAB who presents for a pessary fitting. Plan: She was fitted with a #1 incontinence dish pessary. She will keep the pessary in place until next visit.   Follow-up in 2 weeks for a pessary check or sooner as needed.  All questions were answered.    Heidi Folds, MD

## 2020-08-20 ENCOUNTER — Encounter: Payer: Self-pay | Admitting: Obstetrics and Gynecology

## 2020-08-20 ENCOUNTER — Ambulatory Visit (INDEPENDENT_AMBULATORY_CARE_PROVIDER_SITE_OTHER): Payer: Medicare Other | Admitting: Obstetrics and Gynecology

## 2020-08-20 ENCOUNTER — Other Ambulatory Visit: Payer: Self-pay

## 2020-08-20 VITALS — BP 146/83 | HR 61 | Wt 155.0 lb

## 2020-08-20 DIAGNOSIS — N393 Stress incontinence (female) (male): Secondary | ICD-10-CM | POA: Diagnosis not present

## 2020-08-20 NOTE — Patient Instructions (Signed)
You were fit with an incontinence dish pessary. You are electing to leave it in place until your follow up visit. If it falls out you can try to put it back in (with the knob facing outwards) or you can leave it out. If it seems to be slipping down you can use your finger to push it back in deeper - the deeper it is, the better fit.

## 2020-08-21 ENCOUNTER — Telehealth: Payer: Self-pay | Admitting: Internal Medicine

## 2020-08-21 NOTE — Telephone Encounter (Signed)
Cancel [facility] procedures

## 2020-09-03 ENCOUNTER — Ambulatory Visit: Payer: Medicare Other | Admitting: Obstetrics and Gynecology

## 2020-09-09 ENCOUNTER — Encounter: Payer: Medicare Other | Admitting: Internal Medicine

## 2020-09-15 ENCOUNTER — Telehealth: Payer: Self-pay | Admitting: Internal Medicine

## 2020-09-15 DIAGNOSIS — R079 Chest pain, unspecified: Secondary | ICD-10-CM

## 2020-09-15 DIAGNOSIS — K219 Gastro-esophageal reflux disease without esophagitis: Secondary | ICD-10-CM

## 2020-09-15 NOTE — Telephone Encounter (Signed)
Patient is calling to reschedule [facility] EGD. Best contact number [phone]

## 2020-09-15 NOTE — Telephone Encounter (Signed)
Patient has been rescheduled for 10/12/20 in the [facility].  New instructions mailed to the patient

## 2020-09-17 ENCOUNTER — Ambulatory Visit: Payer: Medicare Other | Admitting: Allergy & Immunology

## 2020-09-17 NOTE — Patient Instructions (Addendum)
1. Moderate persistent asthma, uncomplicated - Continue to follow up with pulmonary - Use your ProAir only as needed rather than every night - We will do spirometry at your next office visit since our machine was down today  2. Eosinophilic esophagitis - Testing to foods was negative on 06/26/2020, but food testing does not always correlate with the symptoms of EoE - Continue Dupixent 300 mg subcutaneous every week - Call our office ahead of time to let us know you are coming so one of our nurses can show your husband how to properly give Dupixent injections  3. Perennial allergic rhinitis (dust mite) - Continue with your drops and Claritin as needed.   Please let us know if this treatment plan is not working well for you Schedule a follow up appointment in 4 weeks with Dr. Ernst Bowler

## 2020-09-18 ENCOUNTER — Ambulatory Visit (INDEPENDENT_AMBULATORY_CARE_PROVIDER_SITE_OTHER): Payer: Medicare Other | Admitting: Family

## 2020-09-18 ENCOUNTER — Encounter: Payer: Self-pay | Admitting: Family

## 2020-09-18 ENCOUNTER — Other Ambulatory Visit: Payer: Self-pay

## 2020-09-18 VITALS — BP 126/84 | HR 59 | Temp 98.8°F | Resp 16 | Ht 66.0 in | Wt 160.0 lb

## 2020-09-18 DIAGNOSIS — K200 Eosinophilic esophagitis: Secondary | ICD-10-CM

## 2020-09-18 DIAGNOSIS — J310 Chronic rhinitis: Secondary | ICD-10-CM | POA: Diagnosis not present

## 2020-09-18 DIAGNOSIS — J4540 Moderate persistent asthma, uncomplicated: Secondary | ICD-10-CM | POA: Diagnosis not present

## 2020-09-18 NOTE — Progress Notes (Signed)
[address], [address] [place] [address] Dept: [phone]  FOLLOW UP NOTE  Patient ID: Heidi Torres, adult    DOB: Jul 31, 1955  Age: 65 y.o. MRN: [id number] Date of Office Visit: 09/18/2020  Assessment  Chief Complaint: OTHER (Says she is well. Says she wants her Dupixant injection site looked at )  HPI Heidi Torres is a 65 year old female that presents for follow-up of moderate persistent asthma, eosinophilic esophagitis, and perennial allergic rhinitis.  She was last seen on June 26, 2020 by Dr. Ernst Bowler.  Eosinophilic esophagitis is reported as the same since starting Dupixent injections once a week.  She reports occasional sensation of food impaction with carrots, apples, and [place] fries.  She also reports a heartburn sensation that is not really heartburn but feels more like pressure.  She denies regurgitation, difficulty swallowing, and abdominal pain.  She has been off all heartburn medication since March and has not been able to tell a difference in her symptoms since being off.  She has a question about an injection site "bump" that is under her skin. Her husband gave her Dupixent injection earlier today on the right side of her abdomen.  She denies any pain at the injection site, fever, or chills.  She mentions that this has happened with previous injections but the bumps will go away on their own.  She has had 2 endoscopies in 2021 showing 2 strictures.  She reports that she has a [facility] EGD scheduled on October 10.  Moderate persistent asthma is reported as moderately controlled with Dupixent injections once a week and ProAir.  She has not seen her pulmonologist in a long time.  She reports occasional cough, wheeze, and shortness of breath.  She denies tightness in her chest.  She uses her albuterol inhaler daily but feels like she uses it more as a habit, because she is afraid of waking up with symptoms.  Since her last office visit she has not required any  systemic steroids or made any trips to the emergency room or urgent care due to breathing problems.  She has not noticed a difference in her breathing since switching from Nucala injections to Dupixent injections.  Perennial allergic rhinitis is reported as moderately controlled with her allergy drops  from ENT and Claritin as needed.  She reports postnasal drip and denies rhinorrhea, nasal congestion, and itchy watery eyes.  She has not had any sinus infections since we last saw her.   Drug Allergies:  Allergies  Allergen Reactions   Benzoin     Other reaction(s): Other (See Comments) Blisters   Ciprofloxacin Other (See Comments)    Arms were numb   Steri-Strip Compound Benzoin [Benzoin Compound] Other (See Comments)    Blisters    Review of Systems: Review of Systems  Constitutional:  Negative for chills and fever.       Reports hot prickly sensation at times  HENT:         Reports postnasal drip and denies rhinorrhea and nasal congestion.  Respiratory:         Reports occasional cough, wheeze, and shortness of breath.  Denies tightness in chest.  Cardiovascular:  Negative for chest pain and palpitations.       Reports of the sudden rapid heart rate changes at times.  She has history of A. fib  Gastrointestinal:        Denies difficulty swallowing, regurgitation, and abdominal pain.  Reports heartburn every day, but not exactly  because it is more like pressure.  Reports occasional food impaction with carrots, apples, and [place] fries.  Genitourinary:  Negative for frequency.  Skin:  Negative for itching and rash.  Neurological:  Negative for headaches.  Endo/Heme/Allergies:  Positive for environmental allergies.    Physical Exam: BP 126/84   Pulse (!) 59   Temp 98.8 F (37.1 C) (Temporal)   Resp 16   Ht '5\' 6"'$  (1.676 m)   Wt 160 lb (72.6 kg)   LMP  (LMP Unknown)   SpO2 96%   BMI 25.82 kg/m    Physical Exam Constitutional:      Appearance: Normal appearance.  HENT:      Head: Normocephalic and atraumatic.     Comments: Normal, eyes normal, ears normal, nose: Bilateral lower turbinates mildly edematous with no drainage noted    Right Ear: Tympanic membrane, ear canal and external ear normal.     Left Ear: Tympanic membrane, ear canal and external ear normal.     Mouth/Throat:     Mouth: Mucous membranes are moist.     Pharynx: Oropharynx is clear.  Eyes:     Conjunctiva/sclera: Conjunctivae normal.  Cardiovascular:     Rate and Rhythm: Regular rhythm.     Heart sounds: Normal heart sounds.  Pulmonary:     Effort: Pulmonary effort is normal.     Breath sounds: Normal breath sounds.     Comments: Lungs clear to auscultation Musculoskeletal:     Cervical back: Neck supple.  Skin:    General: Skin is warm.     Comments: Small bump under skin palpated on right abdominal area. No erythema noted.  Neurological:     Mental Status: She is alert and oriented to person, place, and time.  Psychiatric:        Mood and Affect: Mood normal.        Behavior: Behavior normal.        Thought Content: Thought content normal.        Judgment: Judgment normal.    Diagnostics: Unable to get spirometry due to the machine being down.  Assessment and Plan: 1. Eosinophilic esophagitis   2. Moderate persistent asthma, uncomplicated   3. Chronic rhinitis     No orders of the defined types were placed in this encounter.   Patient Instructions  1. Moderate persistent asthma, uncomplicated - Continue to follow up with pulmonary - Use your ProAir only as needed rather than every night - We will do spirometry at your next office visit since our machine was down today  2. Eosinophilic esophagitis - Testing to foods was negative on 06/26/2020, but food testing does not always correlate with the symptoms of EoE - Continue Dupixent 300 mg subcutaneous every week - Call our office ahead of time to let us know you are coming so one of our nurses can show your  husband how to properly give Dupixent injections  3. Perennial allergic rhinitis (dust mite) - Continue with your drops and Claritin as needed.   Please let us know if this treatment plan is not working well for you Schedule a follow up appointment in 4 weeks with Dr. Ernst Bowler  Return in about 4 weeks (around 10/16/2020), or if symptoms worsen or fail to improve.    Thank you for the opportunity to care for this patient.  Please do not hesitate to contact me with questions.  Althea Charon, FNP [facility]

## 2020-09-23 ENCOUNTER — Encounter: Payer: Self-pay | Admitting: *Deleted

## 2020-09-23 ENCOUNTER — Ambulatory Visit (INDEPENDENT_AMBULATORY_CARE_PROVIDER_SITE_OTHER): Payer: Medicare Other | Admitting: Internal Medicine

## 2020-09-23 ENCOUNTER — Other Ambulatory Visit: Payer: Self-pay

## 2020-09-23 ENCOUNTER — Encounter: Payer: Self-pay | Admitting: Internal Medicine

## 2020-09-23 VITALS — BP 134/76 | HR 57 | Ht 65.5 in | Wt 156.8 lb

## 2020-09-23 DIAGNOSIS — I480 Paroxysmal atrial fibrillation: Secondary | ICD-10-CM

## 2020-09-23 DIAGNOSIS — R079 Chest pain, unspecified: Secondary | ICD-10-CM

## 2020-09-23 NOTE — Patient Instructions (Addendum)
Medication Instructions:  Your physician recommends that you continue on your current medications as directed. Please refer to the Current Medication list given to you today.  Labwork: None ordered.  Testing/Procedures: Your physician has requested that you have cardiac CT. Cardiac computed tomography (CT) is a painless test that uses an x-ray machine to take clear, detailed pictures of your heart. For further information please visit [email or web address]. Please follow instruction sheet as given.   Follow-Up: Your physician wants you to follow-up in: 2-3 months with Dr. Quentin Ore in [place].    Any Other Special Instructions Will Be Listed Below (If Applicable).  If you need a refill on your cardiac medications before your next appointment, please call your pharmacy.

## 2020-09-23 NOTE — Progress Notes (Signed)
Electrophysiology Office Note   Date:  09/23/2020   ID:  Heidi Torres, DOB 1955-03-30, MRN [id number]  PCP:  [name], MD  Cardiologist:  Dr Clayborn Bigness Primary Electrophysiologist: Thompson Grayer, MD    CC: afib   History of Present Illness: Heidi Torres is a 65 y.o. adult who presents today for electrophysiology evaluation.   She is referred by Dr Carlean Purl for EP consultation regarding afib. She was diagnosed with afib remotely.  She presented 02/12/20 with SOB and palpitations and was found to have afib with RVR.  She has been placed on xarelto. She has frequent GERD which she feels triggers her afib. She has occasional ecchymosis on her arms and does not like taking [place].  She feels that afib events occur every few months. She reports with exertion that she develops chest discomfort, SOB, and tachycardia frequently.  This limits her activity.  + prior positive stress test without recent cath.  Today, she denies symptoms of  lower extremity edema, claudication, dizziness, presyncope, syncope, bleeding, or neurologic sequela. The patient is tolerating medications without difficulties and is otherwise without complaint today.    Past Medical History:  Diagnosis Date   Asthma    Atrial fibrillation ([place])    Cataract cortical, senile    Chronic kidney disease    Complication of anesthesia    Dysrhythmia    Eosinophilic esophagitis    Esophageal stricture    GERD (gastroesophageal reflux disease)    Headache    occular HA   Heart murmur    History of hiatal hernia    Hx of dysplastic nevus    multiple sites   Hx of squamous cell carcinoma of skin 06/22/2015   R infrascapular, SCC in situ   Hypercholesteremia    Hypertension    Irregular heart beat    pac's and pvc's   Pneumonia    11-2016   PONV (postoperative nausea and vomiting)    Renal cell cancer, left (HCC)    Status post dilation of esophageal narrowing    Past Surgical History:  Procedure  Laterality Date   ABDOMINAL HYSTERECTOMY     ABLATION     prior to hysterectomy   CYSTOSCOPY/RETROGRADE/URETEROSCOPY Left 01/20/2017   Procedure: CYSTOSCOPY/RETROGRADE/URETEROSCOPY/ BIOPSY,BLADDER BIOPSY PLACEMENT STENT LEFT URETER;  Surgeon: Festus Aloe, MD;  Location: [facility];  Service: Urology;  Laterality: Left;   DILATION AND CURETTAGE OF UTERUS     ESOPHAGOGASTRODUODENOSCOPY N/A 12/16/2019   Procedure: ESOPHAGOGASTRODUODENOSCOPY (EGD);  Surgeon: Lesly Rubenstein, MD;  Location: [facility] ENDOSCOPY;  Service: Endoscopy;  Laterality: N/A;   ESOPHAGOGASTRODUODENOSCOPY (EGD) WITH PROPOFOL N/A 01/09/2019   Procedure: ESOPHAGOGASTRODUODENOSCOPY (EGD) WITH PROPOFOL;  Surgeon: Toledo, Benay Pike, MD;  Location: [facility] ENDOSCOPY;  Service: Gastroenterology;  Laterality: N/A;   NASAL ENDOSCOPY WITH EPISTAXIS CONTROL     ROBOTIC ASSITED PARTIAL NEPHRECTOMY Left 02/24/2017   Procedure: XI ROBOTIC ASSITED LEFT  RADICAL NEPHRECTOMY;  Surgeon: Alexis Frock, MD;  Location: [facility];  Service: Urology;  Laterality: Left;   TUBAL LIGATION       Current Outpatient Medications  Medication Sig Dispense Refill   albuterol (2.5 MG/3ML) 0.083% NEBU 3 mL, albuterol (5 MG/ML) 0.5% NEBU 0.5 mL Inhale into the lungs.     albuterol (VENTOLIN HFA) 108 (90 Base) MCG/ACT inhaler Inhale 2 puffs into the lungs every 4 (four) hours as needed.      ALPRAZolam (XANAX) 0.25 MG tablet Take 0.25 mg by mouth at bedtime as needed for anxiety. Anxiety.  5   atorvastatin (LIPITOR) 40 MG tablet Take 40 mg by mouth daily.     Cholecalciferol (VITAMIN D3) 1000 units CAPS Take 1 capsule by mouth daily.     DUPIXENT 300 MG/2ML prefilled syringe Inject into the skin.     EPINEPHrine 0.3 mg/0.3 mL IJ SOAJ injection See admin instructions.     magnesium 30 MG tablet Take 30 mg by mouth 2 (two) times daily.     olmesartan (BENICAR) 20 MG tablet Take 20 mg by mouth daily.     potassium chloride (MICRO-K) 10 MEQ CR capsule Take 20 mEq by  mouth daily.  11   rivaroxaban (XARELTO) 20 MG TABS tablet Take 1 tablet by mouth daily with breakfast.     triamterene-hydrochlorothiazide (DYAZIDE) 37.5-25 MG per capsule Take 1 capsule by mouth daily.     zafirlukast (ACCOLATE) 20 MG tablet TAKE 1 TABLET BY MOUTH TWICE A DAY 180 tablet 1   levalbuterol (XOPENEX) 0.63 MG/3ML nebulizer solution Take 3 mLs (0.63 mg total) by nebulization every 4 (four) hours as needed for wheezing or shortness of breath. Use as needed if Xopenex inhaler provides insufficient relief of shortness of breath, wheezing, chest tightness 30 mL 5   No current facility-administered medications for this visit.    Allergies:   Benzoin, Ciprofloxacin, and Steri-strip compound benzoin [benzoin compound]   Social History:  The patient  reports that she has never smoked. She has never used smokeless tobacco. She reports that she does not drink alcohol and does not use drugs.   Family History:  The patient's family history includes AAA (abdominal aortic aneurysm) in her maternal grandmother; Heart disease in her father and mother; Hyperlipidemia in her mother; Hypertension in her brother, father, and sister; Lung cancer in her maternal grandfather; Squamous cell carcinoma in her father.    ROS:  Please see the history of present illness.   All other systems are personally reviewed and negative.    PHYSICAL EXAM: VS:  BP 134/76   Pulse (!) 57   Ht 5' 5.5" (1.664 m)   Wt 156 lb 12.8 oz (71.1 kg)   LMP  (LMP Unknown)   SpO2 91%   BMI 25.70 kg/m  , BMI Body mass index is 25.7 kg/m. GEN: Well nourished, well developed, in no acute distress HEENT: normal Neck: no JVD, carotid bruits, or masses Cardiac: RRR; no murmurs, rubs, or gallops,no edema  Respiratory:  clear to auscultation bilaterally, normal work of breathing GI: soft, nontender, nondistended, + BS MS: no deformity or atrophy Skin: warm and dry  Neuro:  Strength and sensation are intact Psych: euthymic mood,  full affect  EKG:  EKG is ordered today. The ekg ordered today is personally reviewed and shows sinus rhythm 57 bpm, short PR interval, q waves noted, without obvious pre-excitation   Recent Labs: 02/12/2020: ALT 29; TSH 2.965 05/21/2020: BUN 31; Creatinine, Ser 1.34; Hemoglobin 10.7; Magnesium 2.0; Platelets 267; Potassium 3.7; Sodium 136  personally reviewed   Lipid Panel  No results found for: CHOL, TRIG, HDL, CHOLHDL, VLDL, LDLCALC, LDLDIRECT personally reviewed   Wt Readings from Last 3 Encounters:  09/23/20 156 lb 12.8 oz (71.1 kg)  09/18/20 160 lb (72.6 kg)  08/20/20 155 lb (70.3 kg)      Other studies personally reviewed: Additional studies/ records that were reviewed today include: AF clinic notes,   Review of the above records today demonstrates: as above  Echo 05/28/20- normal EF, moderate LVH, mild MR,  ASSESSMENT AND  PLAN:  1.  Paroxysmal atrial fibrillation The patient has symptomatic, recurrent vagally mediated atrial fibrillation. she has not tried AADs Chads2vasc score is 3.  she is anticoagulated with xarelto . She has short PR interval with unusual q waves.  Though not clearly pre-excited, she may have an accessory pathway. Therapeutic strategies for afib including medicine and ablation were discussed in detail with the patient today. Risk, benefits, and alternatives to each approach was discussed at length.  Currently, she is not interested in AADs or ablation.  She states, I would like to not take xarelto. We discussed [facility] today.  She is not currently interested in proceeding but would like more information.  I will therefore obtain cardiac CT and refer to Dr Quentin Ore for further discussions. She may also be a good candidate for React AF trial if this becomes an option.  2. HTN Stable No change required today  3. Chest pain with activity associated with tachypalpitations She has kardiamobile.  I have advised that she try to capture her rhythm with  these events. Given prior abnormal stress test, will order cardiac CT with FFR at same time as watchman protocol CT  Risks, benefits and potential toxicities for medications prescribed and/or refilled reviewed with patient today.    Follow-up:  Dr Quentin Ore in 2-3 months    Signed, Thompson Grayer, MD  09/23/2020 11:54 AM     [address] [place] [address] [phone] (office) [phone] (fax)

## 2020-09-24 ENCOUNTER — Other Ambulatory Visit: Payer: Medicare Other | Admitting: *Deleted

## 2020-09-24 DIAGNOSIS — R079 Chest pain, unspecified: Secondary | ICD-10-CM

## 2020-09-24 DIAGNOSIS — I480 Paroxysmal atrial fibrillation: Secondary | ICD-10-CM

## 2020-09-24 NOTE — Addendum Note (Signed)
Addended by: Darrell Jewel on: 09/24/2020 12:59 PM   Modules accepted: Orders

## 2020-09-25 LAB — BASIC METABOLIC PANEL
BUN/Creatinine Ratio: 26 (ref 12–28)
BUN: 33 mg/dL — ABNORMAL HIGH (ref 8–27)
CO2: 24 mmol/L (ref 20–29)
Calcium: 9.6 mg/dL (ref 8.7–10.3)
Chloride: 101 mmol/L (ref 96–106)
Creatinine, Ser: 1.27 mg/dL — ABNORMAL HIGH (ref 0.57–1.00)
Glucose: 97 mg/dL (ref 65–99)
Potassium: 4.1 mmol/L (ref 3.5–5.2)
Sodium: 139 mmol/L (ref 134–144)
eGFR: 47 mL/min/{1.73_m2} — ABNORMAL LOW (ref >59)

## 2020-10-01 ENCOUNTER — Ambulatory Visit (HOSPITAL_COMMUNITY): Payer: Medicare Other

## 2020-10-01 ENCOUNTER — Encounter: Payer: Self-pay | Admitting: Nurse Practitioner

## 2020-10-12 ENCOUNTER — Telehealth: Payer: Self-pay | Admitting: Internal Medicine

## 2020-10-12 ENCOUNTER — Encounter: Payer: Medicare Other | Admitting: Internal Medicine

## 2020-10-12 NOTE — Telephone Encounter (Signed)
Needs to reschedule EGD w/ Bravo and hold anti-coag per our routine protocols. I believe she already had an ok from cardiology but we may need to double check

## 2020-10-12 NOTE — Telephone Encounter (Signed)
Good Morning Dr. Carlean Purl,  I called patient today at 7:15 am to see if she was still coming for her Bravo procedure today.  She stated she left a message with the answering service that she had  forgot to stop her blood thinners.  she was very upset with herself and sorry to have missed this appointment. She is interested in rescheduling

## 2020-10-13 ENCOUNTER — Other Ambulatory Visit: Payer: Self-pay

## 2020-10-13 NOTE — Telephone Encounter (Signed)
Patient has been rescheduled.  New instructions sent to her via [employer].

## 2020-11-03 ENCOUNTER — Telehealth: Payer: Self-pay | Admitting: *Deleted

## 2020-11-03 NOTE — Telephone Encounter (Signed)
I agree with trying the arm. I hope we can continue with the [place] so that we can see whether this helps her EoE.   Salvatore Marvel, MD [facility]

## 2020-11-03 NOTE — Telephone Encounter (Signed)
Noted,  Thank you!

## 2020-11-03 NOTE — Telephone Encounter (Signed)
Patient called and states that she has been having large localized swelling from her [place]. No itching and not hot to the touch and is usually gone within 24 hours. She get the injections in her stomach but she feels that if she gets them in her arm it will be better. She wants to try and see if she can make this work and see if she can benefit from the [place] before feeling like she has to stop. Patient has been scheduled to come into [facility] tomorrow to have the shot nurse show her husband how to administer the [place] into her arm. Do you think this seems like a feasible plan Dr. Ernst Bowler? She said that you and Chrissie have seen the localized swelling in the past and were not concerned.

## 2020-11-04 ENCOUNTER — Other Ambulatory Visit: Payer: Self-pay

## 2020-11-04 ENCOUNTER — Telehealth: Payer: Self-pay | Admitting: Oncology

## 2020-11-04 ENCOUNTER — Ambulatory Visit: Payer: Medicare Other

## 2020-11-04 NOTE — Telephone Encounter (Signed)
Patient is being scheduled for a cardiac CT with contrast as requested by Dr. Rayann Heman.  She has spoken to her nephrologist who thinks the need for contrast with the cardiac CT is reasonable.  Ms. Brick wanted to check if Dr. Grayland Ormond could order CT abdomin/pelvis with contrast to be obtained at same time as the cardiac CT.  I called the CT department and the CT of abdomen/pelvis can not be obtained same day as a cardiac CT.

## 2020-11-04 NOTE — Telephone Encounter (Signed)
Patient and her husband came in today, Patient's husband has given patient and their son injections many times however it is usually in the stomach. Patient would like for her husband to start giving her the dupixent in her arm. Patient's husband received instructions and demonstration of how to administer. Patient's husband administer injection properly and safely. Patient waited around for a 10-15 minutes with no problems.

## 2020-11-04 NOTE — Telephone Encounter (Signed)
Pt wants to [facility] if Dr. Woodfin Ganja can order a CAT scan for her. Please call and set up appt for her to see MD. Call at [phone]

## 2020-11-09 ENCOUNTER — Encounter: Payer: Self-pay | Admitting: Nurse Practitioner

## 2020-11-11 ENCOUNTER — Telehealth: Payer: Self-pay | Admitting: Internal Medicine

## 2020-11-11 NOTE — Telephone Encounter (Signed)
Pt stated that she has two questions prior to her Bravo that is scheduled next week  1: Is Gaviscon an antacid?? pt was notified that it is an antacid. 2 Pt is questioning can the Dr. Joellyn Rued a bx of the cells in her esophagus to compare to previous bx. Pt states that she has been taking the Dupixent that the allergist prescribed for her Eosinophilic Esophagitis  Please advise on pt second question please .

## 2020-11-11 NOTE — Telephone Encounter (Signed)
Inbound call from patient requesting a call from a nurse please in regards to upcoming procedure.

## 2020-11-12 ENCOUNTER — Ambulatory Visit (INDEPENDENT_AMBULATORY_CARE_PROVIDER_SITE_OTHER): Payer: Medicare Other | Admitting: Dermatology

## 2020-11-12 ENCOUNTER — Other Ambulatory Visit: Payer: Self-pay

## 2020-11-12 DIAGNOSIS — Z808 Family history of malignant neoplasm of other organs or systems: Secondary | ICD-10-CM | POA: Diagnosis not present

## 2020-11-12 DIAGNOSIS — L820 Inflamed seborrheic keratosis: Secondary | ICD-10-CM | POA: Diagnosis not present

## 2020-11-12 DIAGNOSIS — D229 Melanocytic nevi, unspecified: Secondary | ICD-10-CM

## 2020-11-12 DIAGNOSIS — L814 Other melanin hyperpigmentation: Secondary | ICD-10-CM

## 2020-11-12 DIAGNOSIS — L821 Other seborrheic keratosis: Secondary | ICD-10-CM

## 2020-11-12 DIAGNOSIS — L570 Actinic keratosis: Secondary | ICD-10-CM | POA: Diagnosis not present

## 2020-11-12 DIAGNOSIS — Z1283 Encounter for screening for malignant neoplasm of skin: Secondary | ICD-10-CM | POA: Diagnosis not present

## 2020-11-12 DIAGNOSIS — L578 Other skin changes due to chronic exposure to nonionizing radiation: Secondary | ICD-10-CM | POA: Diagnosis not present

## 2020-11-12 DIAGNOSIS — I781 Nevus, non-neoplastic: Secondary | ICD-10-CM

## 2020-11-12 DIAGNOSIS — D1801 Hemangioma of skin and subcutaneous tissue: Secondary | ICD-10-CM

## 2020-11-12 DIAGNOSIS — D692 Other nonthrombocytopenic purpura: Secondary | ICD-10-CM

## 2020-11-12 NOTE — Progress Notes (Signed)
Follow-Up Visit   Subjective  Heidi Torres is a 65 y.o. adult who presents for the following: Annual Exam (Mole check ). Pt c/o several irritated itchy spots on her body. Fx hx of SCC The patient presents for Total-Body Skin Exam (TBSE) for skin cancer screening and mole check.   The following portions of the chart were reviewed this encounter and updated as appropriate:   Tobacco  Allergies  Meds  Problems  Med Hx  Surg Hx  Fam Hx     Review of Systems:  No other skin or systemic complaints except as noted in HPI or Assessment and Plan.  Objective  Well appearing patient in no apparent distress; mood and affect are within normal limits.  A full examination was performed including scalp, head, eyes, ears, nose, lips, neck, chest, axillae, abdomen, back, buttocks, bilateral upper extremities, bilateral lower extremities, hands, feet, fingers, toes, fingernails, and toenails. All findings within normal limits unless otherwise noted below.  right arm x 3, left hip x 3, chest x 1, right inframammary 1  (8) (8) Erythematous keratotic or waxy stuck-on papule or plaque.   face x 8 (8) Erythematous thin papules/macules with gritty scale.    Assessment & Plan  Inflamed seborrheic keratosis right arm x 3, left hip x 3, chest x 1, right inframammary 1  (8)  Reassured benign age-related growth.  Recommend observation.  Discussed cryotherapy if spot(s) become irritated or inflamed.   Prior to procedure, discussed risks of blister formation, small wound, skin dyspigmentation, or rare scar following cryotherapy. Recommend Vaseline ointment to treated areas while healing.   Destruction of lesion - right arm x 3, left hip x 3, chest x 1, right inframammary 1  (8) Complexity: simple   Destruction method: cryotherapy   Informed consent: discussed and consent obtained   Timeout:  patient name, date of birth, surgical site, and procedure verified Lesion destroyed using liquid nitrogen:  Yes   Region frozen until ice ball extended beyond lesion: Yes   Outcome: patient tolerated procedure well with no complications   Post-procedure details: wound care instructions given    AK (actinic keratosis) (8) face x 8  Actinic keratoses are precancerous spots that appear secondary to cumulative UV radiation exposure/sun exposure over time. They are chronic with expected duration over 1 year. A portion of actinic keratoses will progress to squamous cell carcinoma of the skin. It is not possible to reliably predict which spots will progress to skin cancer and so treatment is recommended to prevent development of skin cancer.  Recommend daily broad spectrum sunscreen SPF 30+ to sun-exposed areas, reapply every 2 hours as needed.  Recommend staying in the shade or wearing long sleeves, sun glasses (UVA+UVB protection) and wide brim hats (4-inch brim around the entire circumference of the hat). Call for new or changing lesions.   Destruction of lesion - face x 8 Complexity: simple   Destruction method: cryotherapy   Informed consent: discussed and consent obtained   Timeout:  patient name, date of birth, surgical site, and procedure verified Lesion destroyed using liquid nitrogen: Yes   Region frozen until ice ball extended beyond lesion: Yes   Outcome: patient tolerated procedure well with no complications   Post-procedure details: wound care instructions given    Skin cancer screening  Lentigines - Scattered tan macules - Due to sun exposure - Benign-appearing, observe - Recommend daily broad spectrum sunscreen SPF 30+ to sun-exposed areas, reapply every 2 hours as needed. - Call  for any changes  Seborrheic Keratoses - Stuck-on, waxy, tan-brown papules and/or plaques  - Benign-appearing - Discussed benign etiology and prognosis. - Observe - Call for any changes  Melanocytic Nevi - Tan-brown and/or pink-flesh-colored symmetric macules and papules - Benign appearing on exam  today - Observation - Call clinic for new or changing moles - Recommend daily use of broad spectrum spf 30+ sunscreen to sun-exposed areas.   Hemangiomas - Red papules - Discussed benign nature - Observe - Call for any changes  Actinic Damage - Chronic condition, secondary to cumulative UV/sun exposure - diffuse scaly erythematous macules with underlying dyspigmentation - Recommend daily broad spectrum sunscreen SPF 30+ to sun-exposed areas, reapply every 2 hours as needed.  - Staying in the shade or wearing long sleeves, sun glasses (UVA+UVB protection) and wide brim hats (4-inch brim around the entire circumference of the hat) are also recommended for sun protection.  - Call for new or changing lesions.  Purpura - Chronic; persistent and recurrent.  Treatable, but not curable. - Violaceous macules and patches - Benign - Related to trauma, age, sun damage and/or use of blood thinners, chronic use of topical and/or oral steroids - Observe - Can use OTC arnica containing moisturizer such as Dermend Bruise Formula if desired - Call for worsening or other concerns   Varicose Veins/Spider Veins - Dilated blue, purple or red veins at the lower extremities - Reassured - Smaller vessels can be treated by sclerotherapy (a procedure to inject a medicine into the veins to make them disappear) if desired, but the treatment is not covered by insurance. Larger vessels may be covered if symptomatic and we would refer to vascular surgeon if treatment desired.   Skin cancer screening performed today.   Return in about 1 year (around 11/12/2021) for TBSE.  IMarye Round, CMA, am acting as scribe for Sarina Ser, MD .  Documentation: I have reviewed the above documentation for accuracy and completeness, and I agree with the above.  Sarina Ser, MD

## 2020-11-12 NOTE — Telephone Encounter (Signed)
Pt notified of Dr. Carlean Purl recommendation  Pt verbalized understanding with all questions answered .

## 2020-11-12 NOTE — Telephone Encounter (Signed)
Gaviscon is an antacid but she can take it up until 1 day before the test - actually 12 hours before  I can take a biopsy

## 2020-11-12 NOTE — Patient Instructions (Addendum)
Cryotherapy Aftercare  Wash gently with soap and water everyday.   Apply Vaseline and Band-Aid daily until healed.  Prior to procedure, discussed risks of blister formation, small wound, skin dyspigmentation, or rare scar following cryotherapy. Recommend Vaseline ointment to treated areas while healing.    If you have any questions or concerns for your doctor, please call our main line at [phone] and press option 4 to reach your doctor's medical assistant. If no one answers, please leave a voicemail as directed and we will return your call as soon as possible. Messages left after 4 pm will be answered the following business day.   You may also send us a message via MyChart. We typically respond to MyChart messages within 1-2 business days.  For prescription refills, please ask your pharmacy to contact our office. Our fax number is [phone].  If you have an urgent issue when the clinic is closed that cannot wait until the next business day, you can page your doctor at the number below.    Please note that while we do our best to be available for urgent issues outside of office hours, we are not available 24/7.   If you have an urgent issue and are unable to reach us, you may choose to seek medical care at your doctor's office, retail clinic, urgent care center, or emergency room.  If you have a medical emergency, please immediately call 911 or go to the emergency department.  Pager Numbers  - Dr. Kowalski: [phone]  - Dr. Moye: [phone]  - Dr. Stewart: [phone]  In the event of inclement weather, please call our main line at [phone] for an update on the status of any delays or closures.  Dermatology Medication Tips: Please keep the boxes that topical medications come in in order to help keep track of the instructions about where and how to use these. Pharmacies typically print the medication instructions only on the boxes and not directly on the medication  tubes.   If your medication is too expensive, please contact our office at [phone] option 4 or send us a message through MyChart.   We are unable to tell what your co-pay for medications will be in advance as this is different depending on your insurance coverage. However, we may be able to find a substitute medication at lower cost or fill out paperwork to get insurance to cover a needed medication.   If a prior authorization is required to get your medication covered by your insurance company, please allow us 1-2 business days to complete this process.  Drug prices often vary depending on where the prescription is filled and some pharmacies may offer cheaper prices.  The website www.goodrx.com contains coupons for medications through different pharmacies. The prices here do not account for what the cost may be with help from insurance (it may be cheaper with your insurance), but the website can give you the price if you did not use any insurance.  - You can print the associated coupon and take it with your prescription to the pharmacy.  - You may also stop by our office during regular business hours and pick up a GoodRx coupon card.  - If you need your prescription sent electronically to a different pharmacy, notify our office through [facility] MyChart or by phone at [phone] option 4.  

## 2020-11-17 ENCOUNTER — Telehealth: Payer: Self-pay | Admitting: Internal Medicine

## 2020-11-17 ENCOUNTER — Encounter: Payer: Self-pay | Admitting: Internal Medicine

## 2020-11-17 ENCOUNTER — Encounter: Payer: Self-pay | Admitting: Dermatology

## 2020-11-17 ENCOUNTER — Ambulatory Visit (AMBULATORY_SURGERY_CENTER): Payer: Medicare Other | Admitting: Internal Medicine

## 2020-11-17 ENCOUNTER — Other Ambulatory Visit: Payer: Self-pay

## 2020-11-17 VITALS — BP 123/48 | HR 54 | Temp 98.0°F | Resp 11 | Ht 65.0 in | Wt 159.0 lb

## 2020-11-17 DIAGNOSIS — K222 Esophageal obstruction: Secondary | ICD-10-CM | POA: Diagnosis not present

## 2020-11-17 DIAGNOSIS — K317 Polyp of stomach and duodenum: Secondary | ICD-10-CM | POA: Diagnosis not present

## 2020-11-17 DIAGNOSIS — K200 Eosinophilic esophagitis: Secondary | ICD-10-CM | POA: Diagnosis not present

## 2020-11-17 DIAGNOSIS — K2289 Other specified disease of esophagus: Secondary | ICD-10-CM

## 2020-11-17 DIAGNOSIS — K219 Gastro-esophageal reflux disease without esophagitis: Secondary | ICD-10-CM

## 2020-11-17 DIAGNOSIS — R12 Heartburn: Secondary | ICD-10-CM

## 2020-11-17 MED ORDER — SODIUM CHLORIDE 0.9 % IV SOLN: 500.0000 mL | Freq: Once | INTRAVENOUS | Status: DC

## 2020-11-17 NOTE — Telephone Encounter (Signed)
Patient called.  She had the BRAVO procedure this morning and has questions regarding the monitor.  Please call [phone].  Thank you.

## 2020-11-17 NOTE — Progress Notes (Signed)
[facility] History and Physical   Primary Care Physician:  [name], MD   Reason for Procedure:   GERD and ? Trigger of Afib, also eosinophilic esophagitis  Plan:    EGD and Bravo pH probe     HPI: Heidi Torres is a 65 y.o. adult woman with hx EoE and suspected GERD as a trigger of A fib. She is here to see if she has pathologic reflux (not on PPI). Also reassess EoE.   Past Medical History:  Diagnosis Date   Asthma    Atrial fibrillation ([place])    Cataract cortical, senile    Chronic kidney disease    Complication of anesthesia    Eosinophilic esophagitis    Esophageal stricture    GERD (gastroesophageal reflux disease)    Headache    occular HA   History of hiatal hernia    Hx of dysplastic nevus    multiple sites   Hx of squamous cell carcinoma of skin 06/22/2015   R infrascapular, SCC in situ   Hypercholesteremia    Hypertension    Irregular heart beat    pac's and pvc's   Pneumonia    11-2016   PONV (postoperative nausea and vomiting)    Renal cell cancer, left (HCC)    Status post dilation of esophageal narrowing     Past Surgical History:  Procedure Laterality Date   ABDOMINAL HYSTERECTOMY     ABLATION     prior to hysterectomy   CYSTOSCOPY/RETROGRADE/URETEROSCOPY Left 01/20/2017   Procedure: CYSTOSCOPY/RETROGRADE/URETEROSCOPY/ BIOPSY,BLADDER BIOPSY PLACEMENT STENT LEFT URETER;  Surgeon: Festus Aloe, MD;  Location: [facility];  Service: Urology;  Laterality: Left;   DILATION AND CURETTAGE OF UTERUS     ESOPHAGOGASTRODUODENOSCOPY N/A 12/16/2019   Procedure: ESOPHAGOGASTRODUODENOSCOPY (EGD);  Surgeon: Lesly Rubenstein, MD;  Location: [facility] ENDOSCOPY;  Service: Endoscopy;  Laterality: N/A;   ESOPHAGOGASTRODUODENOSCOPY (EGD) WITH PROPOFOL N/A 01/09/2019   Procedure: ESOPHAGOGASTRODUODENOSCOPY (EGD) WITH PROPOFOL;  Surgeon: Toledo, Benay Pike, MD;  Location: [facility] ENDOSCOPY;  Service: Gastroenterology;  Laterality: N/A;   NASAL  ENDOSCOPY WITH EPISTAXIS CONTROL     ROBOTIC ASSITED PARTIAL NEPHRECTOMY Left 02/24/2017   Procedure: XI ROBOTIC ASSITED LEFT  RADICAL NEPHRECTOMY;  Surgeon: Alexis Frock, MD;  Location: [facility];  Service: Urology;  Laterality: Left;   TUBAL LIGATION      Prior to Admission medications   Medication Sig Start Date End Date Taking? Authorizing Provider  atorvastatin (LIPITOR) 40 MG tablet Take 40 mg by mouth daily. 09/26/18  Yes [provider]  Cholecalciferol (VITAMIN D3) 1000 units CAPS Take 1 capsule by mouth daily.   Yes [provider]  DUPIXENT 300 MG/2ML prefilled syringe Inject into the skin. 09/07/20  Yes [provider]  olmesartan (BENICAR) 20 MG tablet Take 20 mg by mouth daily.   Yes [provider]  potassium chloride (MICRO-K) 10 MEQ CR capsule Take 20 mEq by mouth daily. 01/23/17  Yes [provider]  triamterene-hydrochlorothiazide (DYAZIDE) 37.5-25 MG per capsule Take 1 capsule by mouth daily. 07/10/14  Yes [provider]  albuterol (2.5 MG/3ML) 0.083% NEBU 3 mL, albuterol (5 MG/ML) 0.5% NEBU 0.5 mL Inhale into the lungs.    [provider]  albuterol (VENTOLIN HFA) 108 (90 Base) MCG/ACT inhaler Inhale 2 puffs into the lungs every 4 (four) hours as needed.     [provider]  ALPRAZolam Duanne Moron) 0.25 MG tablet Take 0.25 mg by mouth at bedtime as needed for anxiety. Anxiety. 08/11/14  [provider]  EPINEPHrine 0.3 mg/0.3 mL IJ SOAJ injection See admin instructions. 04/22/20   [provider]  levalbuterol Penne Lash) 0.63 MG/3ML nebulizer solution Take 3 mLs (0.63 mg total) by nebulization every 4 (four) hours as needed for wheezing or shortness of breath. Use as needed if Xopenex inhaler provides insufficient relief of shortness of breath, wheezing, chest tightness 03/21/18 03/21/19  Wilhelmina Mcardle, MD  magnesium 30 MG tablet Take 30 mg by mouth 2 (two) times daily.    [provider]   rivaroxaban (XARELTO) 20 MG TABS tablet Take 1 tablet by mouth daily with breakfast. 02/20/20   [provider]  zafirlukast (ACCOLATE) 20 MG tablet TAKE 1 TABLET BY MOUTH TWICE A DAY 01/27/20   Tyler Pita, MD    Current Outpatient Medications  Medication Sig Dispense Refill   atorvastatin (LIPITOR) 40 MG tablet Take 40 mg by mouth daily.     Cholecalciferol (VITAMIN D3) 1000 units CAPS Take 1 capsule by mouth daily.     DUPIXENT 300 MG/2ML prefilled syringe Inject into the skin.     olmesartan (BENICAR) 20 MG tablet Take 20 mg by mouth daily.     potassium chloride (MICRO-K) 10 MEQ CR capsule Take 20 mEq by mouth daily.  11   triamterene-hydrochlorothiazide (DYAZIDE) 37.5-25 MG per capsule Take 1 capsule by mouth daily.     albuterol (2.5 MG/3ML) 0.083% NEBU 3 mL, albuterol (5 MG/ML) 0.5% NEBU 0.5 mL Inhale into the lungs.     albuterol (VENTOLIN HFA) 108 (90 Base) MCG/ACT inhaler Inhale 2 puffs into the lungs every 4 (four) hours as needed.      ALPRAZolam (XANAX) 0.25 MG tablet Take 0.25 mg by mouth at bedtime as needed for anxiety. Anxiety.  5   EPINEPHrine 0.3 mg/0.3 mL IJ SOAJ injection See admin instructions.     levalbuterol (XOPENEX) 0.63 MG/3ML nebulizer solution Take 3 mLs (0.63 mg total) by nebulization every 4 (four) hours as needed for wheezing or shortness of breath. Use as needed if Xopenex inhaler provides insufficient relief of shortness of breath, wheezing, chest tightness 30 mL 5   magnesium 30 MG tablet Take 30 mg by mouth 2 (two) times daily.     rivaroxaban (XARELTO) 20 MG TABS tablet Take 1 tablet by mouth daily with breakfast.     zafirlukast (ACCOLATE) 20 MG tablet TAKE 1 TABLET BY MOUTH TWICE A DAY 180 tablet 1   Current Facility-Administered Medications  Medication Dose Route Frequency Provider Last Rate Last Admin   0.9 %  sodium chloride infusion  500 mL Intravenous Once Gatha Mayer, MD        Allergies as of 11/17/2020 - Review Complete  11/17/2020  Allergen Reaction Noted   Benzoin  01/11/2017   Ciprofloxacin Other (See Comments) 09/07/2014   Steri-strip compound benzoin [benzoin compound] Other (See Comments) 01/11/2017    Family History  Problem Relation Age of Onset   Heart disease Mother    Hyperlipidemia Mother    Squamous cell carcinoma Father    Heart disease Father    Hypertension Father    Hypertension Sister    Hypertension Brother    AAA (abdominal aortic aneurysm) Maternal Grandmother    Lung cancer Maternal Grandfather        smoker   Colon cancer Neg Hx    Esophageal cancer Neg Hx    Stomach cancer Neg Hx     Social History   Socioeconomic History   Marital status:  Married    Spouse name: Not on file   Number of children: 3   Years of education: Not on file   Highest education level: Not on file  Occupational History   Occupation: Teacher  Tobacco Use   Smoking status: Never   Smokeless tobacco: Never  Vaping Use   Vaping Use: Never used  Substance and Sexual Activity   Alcohol use: No   Drug use: Never   Sexual activity: Yes  Other Topics Concern   Not on file  Social History Narrative   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  No alcohol no caffeine no drug use or other tobacco.      Review of Systems:  All other review of systems negative except as mentioned in the HPI.  Physical Exam: Vital signs BP 108/72 (BP Location: Right Arm, Patient Position: Sitting, Cuff Size: Normal)   Pulse 62   Temp 98 F (36.7 C) (Temporal)   Ht 5\' 5"  (1.651 m)   Wt 159 lb (72.1 kg)   LMP  (LMP Unknown)   SpO2 99%   BMI 26.46 kg/m   General:   Alert,  Well-developed, well-nourished, pleasant and cooperative in NAD Lungs:  Clear throughout to auscultation.   Heart:  Regular rate and rhythm; no murmurs, clicks, rubs,  or gallops. Abdomen:  Soft, nontender and nondistended. Normal bowel sounds.   Neuro/Psych:  Alert and cooperative. Normal mood and  affect. A and O x 3   @Shalika Arntz  Simonne Maffucci, MD, [facility] [phone] (pager) 11/17/2020 9:51 AM@

## 2020-11-17 NOTE — Op Note (Addendum)
[place] Patient Name: Heidi Torres Procedure Date: 11/17/2020 9:45 AM MRN: [id number] Endoscopist: Gatha Mayer , MD Age: 65 Referring MD:  Date of Birth: 1955-12-25 Gender: Female Account #: [id number] Procedure:                Upper GI endoscopy Indications:              Heartburn, Suspected gastro-esophageal reflux                            disease, Eosinophilic esophagitis, Follow-up of                            eosinophilic esophagitis Medicines:                Propofol per Anesthesia, Monitored Anesthesia Care Procedure:                Pre-Anesthesia Assessment:                           - Prior to the procedure, a History and Physical                            was performed, and patient medications and                            allergies were reviewed. The patient's tolerance of                            previous anesthesia was also reviewed. The risks                            and benefits of the procedure and the sedation                            options and risks were discussed with the patient.                            All questions were answered, and informed consent                            was obtained. Prior Anticoagulants: The patient                            last took Xarelto (rivaroxaban) 2 days prior to the                            procedure. ASA Grade Assessment: II - A patient                            with mild systemic disease. After reviewing the                            risks and benefits, the patient was deemed in  satisfactory condition to undergo the procedure.                           After obtaining informed consent, the endoscope was                            passed under direct vision. Throughout the                            procedure, the patient's blood pressure, pulse, and                            oxygen saturations were monitored continuously. The                            GIF  [id number] #[id number] was introduced through the                            mouth, and advanced to the second part of duodenum.                            The upper GI endoscopy was accomplished without                            difficulty. The patient tolerated the procedure                            well. Scope In: Scope Out: Findings:                 A non-obstructing Schatzki ring was found at the                            gastroesophageal junction.                           Biopsies were taken with a cold forceps in the                            upper third of the esophagus and in the lower third                            of the esophagus for histology. Verification of                            patient identification for the specimen was done.                            Estimated blood loss was minimal.                           Multiple small semi-sessile polyps were found in                            the gastric fundus and in  the gastric body.                           The exam was otherwise without abnormality.                           The cardia and gastric fundus were normal on                            retroflexion.                           The BRAVO capsule with delivery system was                            introduced through the mouth and advanced into the                            esophagus, such that the BRAVO pH capsule was                            positioned 34 cm from the incisors, which was 6 cm                            proximal to the GE junction. The BRAVO pH capsule                            was then deployed and attached to the esophageal                            mucosa. The delivery system was then withdrawn.                            Endoscopy was utilized for probe placement and                            diagnostic evaluation. Estimated blood loss: none. Complications:            No immediate complications. Estimated Blood Loss:     Estimated blood  loss was minimal. Estimated blood                            loss was minimal. Impression:               - Non-obstructing Schatzki ring.                           - Multiple gastric polyps. Known fundic gland polyps                           - The examination was otherwise normal.                           - Biopsies were taken with a cold forceps for  histology in the upper third of the esophagus and                            in the lower third of the esophagus. Recommendation:           - Patient has a contact number available for                            emergencies. The signs and symptoms of potential                            delayed complications were discussed with the                            patient. Return to normal activities tomorrow.                            Written discharge instructions were provided to the                            patient.                           - Resume previous diet.                           - Continue present medications.                           - Await pathology results.                           - Resume Xarelto (rivaroxaban) at prior dose in 2                            days.Friday 11/18 AM . Gatha Mayer, MD 11/17/2020 10:24:21 AM This report has been signed electronically.

## 2020-11-17 NOTE — Progress Notes (Signed)
A and O x3. Report to RN. Tolerated MAC anesthesia well. 

## 2020-11-17 NOTE — Patient Instructions (Addendum)
I did not see obvious changes of the eosinophilic esophagitis but did take biopsies as planned.  The Bravo probe attached as we wanted. Once the recorder is returned it can take a week or 2 for me to get results to you but usually within 1 week.  Restart Xarelto Friday AM.  I appreciate the opportunity to care for you. Gatha Mayer, MD, [facility]  Post-op Bravo pH instructions Once you get home:  Eat normally and go about your daily routine/activities Limit drinking fluids or eating between meals Do not chew gum or eat hard candy DO NOT take any antacid or anti-reflux medications during the 48-hour monitoring time, unless instructed by your physician  Recording events: Events to be recorded are:  Record using event buttons on recorder and write on paper diary form Every time you eat or drink something (other than water) 2.   Periods of lying down/reclining 3.  Symptoms:  may include heartburn, regurgitation, chest pain, cough or specify if other.  A paper diary is also provided to record the times of your reflux symptoms and times for meals and when you lie down.  The recorder needs to remain within 3 feet (arms length) of you during the testing period (48 hours). If you should forget and move outside of a 3-foot radius of the receiver you may hear beeping and you will see a "C1" error in the display window on the top of the receiver.  Please pick up the receiver and hold close to you to re-establish the connection and the error message disappears.  You may take a bath/shower during the testing period, but the recorder must not get wet and must remain within 3 feet of you. Please leave the receiver outside of the shower or tub while bathing. The monitoring period will be for 48 hours after placement of the capsule.  At the end of the 48 hours, you will return the recorder, and your diary, to our [facility] front desk.  A nurse will meet you to collect the device and answer  any questions you may have.  The device should turn off once the 48 hours is complete.   What to expect after placement of the capsule:  Some patients experience a vague sensation that something is in their esophagus or that they 'feel' the capsule when they swallow food.  Should you experience this, chewing food carefully or drinking liquids may minimize this sensation.   After the test is complete, the disposable capsule will fall off the wall of your esophagus within 5-10 days and pass naturally with your bowel movement through the digestive tract.  Once the recorder is returned, your provider will review and interpret your recordings and contact you to discuss your results.  This may take up to two weeks.   DO NOT have an MRI for 30 days after your procedure to ensure the capsule is no longer inside your body  It is imperative that you return the recorder on _________________________ by 3:00pm.  Your information must be downloaded at this time to obtain your results.

## 2020-11-17 NOTE — Telephone Encounter (Signed)
Returned pt's call. Answered questions about the Bravo monitor. Pt verbalized understanding. Instructed to call back if any further questions.

## 2020-11-17 NOTE — Progress Notes (Signed)
Vitals-DT  History reviewed. 

## 2020-11-17 NOTE — Progress Notes (Signed)
Called to room to assist during endoscopic procedure.  Patient ID and intended procedure confirmed with present staff. Received instructions for my participation in the procedure from the performing physician.  

## 2020-11-19 ENCOUNTER — Telehealth: Payer: Self-pay | Admitting: *Deleted

## 2020-11-19 DIAGNOSIS — K200 Eosinophilic esophagitis: Secondary | ICD-10-CM | POA: Diagnosis not present

## 2020-11-19 DIAGNOSIS — K222 Esophageal obstruction: Secondary | ICD-10-CM | POA: Diagnosis not present

## 2020-11-19 NOTE — Telephone Encounter (Signed)
  Follow up Call-  Call back number 11/17/2020  Post procedure Call Back phone  # [phone]  Permission to leave phone message Yes  Some recent data might be hidden     Patient questions:  Do you have a fever, pain , or abdominal swelling? No. Pain Score  0 *  Have you tolerated food without any problems? Yes.    Have you been able to return to your normal activities? Yes.    Do you have any questions about your discharge instructions: Diet   No. Medications  No. Follow up visit  No.  Do you have questions or concerns about your Care? No.  Actions: * If pain score is 4 or above: No action needed, pain <4.  Have you developed a fever since your procedure? no  2.   Have you had an respiratory symptoms (SOB or cough) since your procedure? no  3.   Have you tested positive for COVID 19 since your procedure no  4.   Have you had any family members/close contacts diagnosed with the COVID 19 since your procedure?  no   If yes to any of these questions please route to Joylene John, RN and Joella Prince, RN

## 2020-11-24 ENCOUNTER — Ambulatory Visit: Payer: Medicare Other | Admitting: Cardiology

## 2020-12-02 ENCOUNTER — Ambulatory Visit (INDEPENDENT_AMBULATORY_CARE_PROVIDER_SITE_OTHER): Payer: Medicare Other | Admitting: Internal Medicine

## 2020-12-02 ENCOUNTER — Other Ambulatory Visit: Payer: Self-pay

## 2020-12-02 VITALS — BP 106/68 | HR 64 | Ht 65.5 in | Wt 158.8 lb

## 2020-12-02 DIAGNOSIS — R079 Chest pain, unspecified: Secondary | ICD-10-CM

## 2020-12-02 DIAGNOSIS — D6869 Other thrombophilia: Secondary | ICD-10-CM

## 2020-12-02 DIAGNOSIS — R072 Precordial pain: Secondary | ICD-10-CM | POA: Diagnosis not present

## 2020-12-02 DIAGNOSIS — I480 Paroxysmal atrial fibrillation: Secondary | ICD-10-CM

## 2020-12-02 NOTE — Progress Notes (Signed)
PCP: [name], MD Primary Cardiologist: Dr Clayborn Bigness Primary EP: Dr Rayann Heman  Heidi Torres is a 65 y.o. adult who presents today for routine electrophysiology followup.  Since last being seen in our clinic, the patient reports doing reasonably well.  She continues to have reflux accompanied by afib.  She finds that this is most common at night.  She also has ongoing exertional CP and SOB.  I ordered CT with FFR.  She has not yet had this performed.  I also referred to see Dr Quentin Ore but she cancelled the appointment.   Today, she denies symptoms of palpitations,  lower extremity edema, dizziness, presyncope, or syncope.  The patient is otherwise without complaint today.   Past Medical History:  Diagnosis Date   Asthma    Atrial fibrillation ([place])    Cataract cortical, senile    Chronic kidney disease    Complication of anesthesia    Eosinophilic esophagitis    Esophageal stricture    GERD (gastroesophageal reflux disease)    Headache    occular HA   History of hiatal hernia    Hx of dysplastic nevus    multiple sites   Hx of squamous cell carcinoma of skin 06/22/2015   R infrascapular, SCC in situ   Hypercholesteremia    Hypertension    Irregular heart beat    pac's and pvc's   Pneumonia    11-2016   PONV (postoperative nausea and vomiting)    Renal cell cancer, left (HCC)    Status post dilation of esophageal narrowing    Past Surgical History:  Procedure Laterality Date   ABDOMINAL HYSTERECTOMY     ABLATION     prior to hysterectomy   CYSTOSCOPY/RETROGRADE/URETEROSCOPY Left 01/20/2017   Procedure: CYSTOSCOPY/RETROGRADE/URETEROSCOPY/ BIOPSY,BLADDER BIOPSY PLACEMENT STENT LEFT URETER;  Surgeon: Festus Aloe, MD;  Location: [facility];  Service: Urology;  Laterality: Left;   DILATION AND CURETTAGE OF UTERUS     ESOPHAGOGASTRODUODENOSCOPY N/A 12/16/2019   Procedure: ESOPHAGOGASTRODUODENOSCOPY (EGD);  Surgeon: Lesly Rubenstein, MD;  Location: [facility] ENDOSCOPY;   Service: Endoscopy;  Laterality: N/A;   ESOPHAGOGASTRODUODENOSCOPY (EGD) WITH PROPOFOL N/A 01/09/2019   Procedure: ESOPHAGOGASTRODUODENOSCOPY (EGD) WITH PROPOFOL;  Surgeon: Toledo, Benay Pike, MD;  Location: [facility] ENDOSCOPY;  Service: Gastroenterology;  Laterality: N/A;   NASAL ENDOSCOPY WITH EPISTAXIS CONTROL     ROBOTIC ASSITED PARTIAL NEPHRECTOMY Left 02/24/2017   Procedure: XI ROBOTIC ASSITED LEFT  RADICAL NEPHRECTOMY;  Surgeon: Alexis Frock, MD;  Location: [facility];  Service: Urology;  Laterality: Left;   TUBAL LIGATION      ROS- all systems are reviewed and negatives except as per HPI above  Current Outpatient Medications  Medication Sig Dispense Refill   albuterol (2.5 MG/3ML) 0.083% NEBU 3 mL, albuterol (5 MG/ML) 0.5% NEBU 0.5 mL Inhale into the lungs.     albuterol (VENTOLIN HFA) 108 (90 Base) MCG/ACT inhaler Inhale 2 puffs into the lungs every 4 (four) hours as needed.      ALPRAZolam (XANAX) 0.25 MG tablet Take 0.25 mg by mouth at bedtime as needed for anxiety. Anxiety.  5   atorvastatin (LIPITOR) 40 MG tablet Take 40 mg by mouth daily.     Cholecalciferol (VITAMIN D3) 1000 units CAPS Take 1 capsule by mouth daily.     DUPIXENT 300 MG/2ML prefilled syringe Inject into the skin.     EPINEPHrine 0.3 mg/0.3 mL IJ SOAJ injection See admin instructions.     magnesium 30 MG tablet Take 30 mg by mouth  2 (two) times daily.     olmesartan (BENICAR) 20 MG tablet Take 20 mg by mouth daily.     potassium chloride (MICRO-K) 10 MEQ CR capsule Take 20 mEq by mouth daily.  11   rivaroxaban (XARELTO) 20 MG TABS tablet Take 1 tablet by mouth daily with breakfast.     triamterene-hydrochlorothiazide (DYAZIDE) 37.5-25 MG per capsule Take 1 capsule by mouth daily.     No current facility-administered medications for this visit.    Physical Exam: Vitals:   12/02/20 1027  BP: 106/68  Pulse: 64  SpO2: 99%  Weight: 158 lb 12.8 oz (72 kg)  Height: 5' 5.5" (1.664 m)    GEN- The patient is well  appearing, alert and oriented x 3 today.   Head- normocephalic, atraumatic Eyes-  Sclera clear, conjunctiva pink Ears- hearing intact Oropharynx- clear Lungs- Clear to ausculation bilaterally, normal work of breathing Heart- Regular rate and rhythm, no murmurs, rubs or gallops, PMI not laterally displaced GI- soft, NT, ND, + BS Extremities- no clubbing, cyanosis, or edema  Wt Readings from Last 3 Encounters:  12/02/20 158 lb 12.8 oz (72 kg)  11/17/20 159 lb (72.1 kg)  09/23/20 156 lb 12.8 oz (71.1 kg)    EKG tracing ordered today is personally reviewed and shows sinus with short PR  Assessment and Plan:  Paroxysmal atrial fibrillation Vagally mediated Chads2vasc score is 3.  She is on xarelto She has short PR interval with unusual q waves.  Though not clearly pre-excited, she may have an accessory pathway. I think that ablation would be her best option.  We discussed today.  She wishes to think about this further.  She is being referred to Dr Quentin Ore to discuss ablation as well as Watch[facility].  2. HTN Stable No change required today  3. Exertional chest pain As per my last office visit (note reviewed) Cardiac CT with FFR is again advised today  4. CRI She has a single kidney She is followed by nephrology.  We discussed this in context of cardiac CT at length today  She had numerous questions which we discussed at length today  Refer to Dr Debara Pickett MD, [facility] 12/02/2020 10:45 AM

## 2020-12-02 NOTE — Patient Instructions (Addendum)
Medication Instructions:  Your physician recommends that you continue on your current medications as directed. Please refer to the Current Medication list given to you today. *If you need a refill on your cardiac medications before your next appointment, please call your pharmacy*  Lab Work: none If you have labs (blood work) drawn today and your tests are completely normal, you will receive your results only by: [place] (if you have MyChart) OR A paper copy in the mail If you have any lab test that is abnormal or we need to change your treatment, we will call you to review the results.  Testing/Procedures: Your physician has requested that you have cardiac CT. Cardiac computed tomography (CT) is a painless test that uses an x-ray machine to take clear, detailed pictures of your heart. For further information please visit [email or web address]. Please follow instruction sheet as given.    Follow-Up: At [facility], you and your health needs are our priority.  As part of our continuing mission to provide you with exceptional heart care, we have created designated Provider Care Teams.  These Care Teams include your primary Cardiologist (physician) and Advanced Practice Providers (APPs -  Physician Assistants and Nurse Practitioners) who all work together to provide you with the care you need, when you need it.  Your physician wants you to follow-up in: after CT scan with Dr. Quentin Ore.  We recommend signing up for the patient portal called "MyChart".  Sign up information is provided on this After Visit Summary.  MyChart is used to connect with patients for Virtual Visits (Telemedicine).  Patients are able to view lab/test results, encounter notes, upcoming appointments, etc.  Non-urgent messages can be sent to your provider as well.   To learn more about what you can do with MyChart, go to [email or web address].    Any Other Special Instructions Will Be Listed Below (If  Applicable).

## 2020-12-03 ENCOUNTER — Telehealth: Payer: Self-pay

## 2020-12-03 NOTE — Telephone Encounter (Signed)
Patient called stating she has switched from doing the [place] in her stomach, to allowing her husband to do it in her arm as previously discussed on 11/03/20. She states this way is very painful and she was unable to finish the whole dose this week as her husband pulled it out because of the pain.  She is wondering if there is another place to do it and if other patients are having this issue?  Also she is switching to [facility] soon and the insurance consultant asked if the patient if her Dupixent is a auto injector or prefilled? Patients insurance will more than likely cover a auto injector if possible. Patient is also wondering if a auto injector would be less painful.  Please Advise

## 2020-12-04 NOTE — Telephone Encounter (Signed)
Please let her know that [place] can be injected into the thigh, stomach (except for the 2 inches around the belly button) or outer area of the upper arm (if caregiver injects). A different site should be chosen each time DUPIXENT is injected. Are they letting the Dupixent sit out over night the day before her injection ? This can make it less viscous (thick) and less painful. Also, is her husband pinching the area where he gives the injection? I have heard that the auto injector is more painful because it is spring loaded.

## 2020-12-04 NOTE — Telephone Encounter (Signed)
Spoke with patient, she would like Tammy to give her a call to answer a few questions she has regarding the [place] and possibly changing to auto injection. Tammy can you please call patient on Monday.

## 2020-12-08 NOTE — Telephone Encounter (Signed)
Heidi Torres please advise °

## 2020-12-09 NOTE — Telephone Encounter (Signed)
Awesome - routing to [place] since she is personally interested and involved with this patient. Thanks for talking to her, Tam Tam!   Salvatore Marvel, MD [facility] [place] [facility]

## 2020-12-09 NOTE — Telephone Encounter (Signed)
Thanks for the update

## 2020-12-09 NOTE — Telephone Encounter (Signed)
Anytime, [name]!   Salvatore Marvel, MD Allergy and [place] [facility] [place]

## 2020-12-14 ENCOUNTER — Telehealth: Payer: Self-pay | Admitting: *Deleted

## 2020-12-14 NOTE — Telephone Encounter (Signed)
Patient has canceled test. Would like to wait for her appointment with Dr. Quentin Ore to discuss.

## 2020-12-14 NOTE — Telephone Encounter (Signed)
-----   Message from [place] sent at 12/14/2020  9:08 AM EST ----- Regarding: RE: ct heart Hey, yes she called and said she want to cancel due to her having a new doctor. She would like to speak to the new doctor first and if she want to have it she will let the new doctor know. ----- Message ----- From: Darrell Jewel, RN Sent: 12/14/2020   8:54 AM EST To: Melony Overly Subject: FW: ct heart                                   Has this been canceled? Trying to assist with scheduling lab prior to.  Thank you  Otila Kluver  ----- Message ----- From: Melony Overly Sent: 12/02/2020   1:26 PM EST To: Ciro Backer, Tempie Donning, RN, # Subject: ct heart                                       Scheduled 01/12/21 at 10:00      Thanks, [place]

## 2020-12-14 NOTE — Telephone Encounter (Signed)
Called to assist with setting up lab appointment prior to CT scan.

## 2020-12-14 NOTE — Telephone Encounter (Signed)
Spoke to patient and let her know that since she was holding off on the CT that we didn't need to set up labs at this time.

## 2020-12-14 NOTE — Telephone Encounter (Signed)
Pt is returning call back to [place] in Triage [phone]

## 2020-12-15 ENCOUNTER — Ambulatory Visit: Payer: Medicare Other | Attending: Obstetrics and Gynecology | Admitting: Physical Therapy

## 2020-12-15 ENCOUNTER — Other Ambulatory Visit: Payer: Self-pay

## 2020-12-15 ENCOUNTER — Encounter: Payer: Self-pay | Admitting: Physical Therapy

## 2020-12-15 DIAGNOSIS — M4125 Other idiopathic scoliosis, thoracolumbar region: Secondary | ICD-10-CM | POA: Insufficient documentation

## 2020-12-15 DIAGNOSIS — I480 Paroxysmal atrial fibrillation: Secondary | ICD-10-CM

## 2020-12-15 DIAGNOSIS — M6208 Separation of muscle (nontraumatic), other site: Secondary | ICD-10-CM | POA: Diagnosis present

## 2020-12-15 DIAGNOSIS — M533 Sacrococcygeal disorders, not elsewhere classified: Secondary | ICD-10-CM | POA: Diagnosis present

## 2020-12-15 DIAGNOSIS — R2689 Other abnormalities of gait and mobility: Secondary | ICD-10-CM | POA: Insufficient documentation

## 2020-12-15 DIAGNOSIS — R079 Chest pain, unspecified: Secondary | ICD-10-CM

## 2020-12-16 ENCOUNTER — Encounter: Payer: Self-pay | Admitting: Physical Therapy

## 2020-12-16 DIAGNOSIS — R079 Chest pain, unspecified: Secondary | ICD-10-CM | POA: Insufficient documentation

## 2020-12-16 NOTE — Therapy (Signed)
[place] [facility]IN [facility] [address] [place], [place], [address] Phone: [phone]   Fax:  [phone]  Physical Therapy Evaluation  Patient Details  Name: Heidi Torres MRN: [id number] Date of Birth: 07/05/1955 Referring Provider (PT): Wannetta Sender   MD   Encounter Date: 12/15/2020   PT End of Session - 12/16/20 0832     Visit Number 1    Number of Visits 10    Date for PT Re-Evaluation 02/24/21    PT Start Time 5176    PT Stop Time 1700    PT Time Calculation (min) 55 min             Past Medical History:  Diagnosis Date   Asthma    Atrial fibrillation ([place])    Cataract cortical, senile    Chronic kidney disease    Complication of anesthesia    Eosinophilic esophagitis    Esophageal stricture    GERD (gastroesophageal reflux disease)    Headache    occular HA   History of hiatal hernia    Hx of dysplastic nevus    multiple sites   Hx of squamous cell carcinoma of skin 06/22/2015   R infrascapular, SCC in situ   Hyperlipidemia    Hypertension    Irregular heart beat    pac's and pvc's   Pneumonia    11-2016   PONV (postoperative nausea and vomiting)    Renal cell cancer, left (HCC)    Status post dilation of esophageal narrowing     Past Surgical History:  Procedure Laterality Date   ABLATION     prior to hysterectomy   CYSTOSCOPY/RETROGRADE/URETEROSCOPY Left 01/20/2017   Procedure: CYSTOSCOPY/RETROGRADE/URETEROSCOPY/ BIOPSY,BLADDER BIOPSY PLACEMENT STENT LEFT URETER;  Surgeon: Festus Aloe, MD;  Location: [facility] OR[facility];  Service: Urology;  Laterality: Left;   DILATION AND CURETTAGE OF UTERUS     ESOPHAGOGASTRODUODENOSCOPY N/A 12/16/2019   Procedure: ESOPHAGOGASTRODUODENOSCOPY (EGD);  Surgeon: Lesly Rubenstein, MD;  Location: [facility] ENDOSCOPY;  Service: Endoscopy;  Laterality: N/A;   ESOPHAGOGASTRODUODENOSCOPY (EGD) WITH PROPOFOL N/A 01/09/2019   Procedure: ESOPHAGOGASTRODUODENOSCOPY (EGD) WITH PROPOFOL;   Surgeon: Toledo, Benay Pike, MD;  Location: [facility] ENDOSCOPY;  Service: Gastroenterology;  Laterality: N/A;   NASAL ENDOSCOPY WITH EPISTAXIS CONTROL     ROBOTIC ASSITED PARTIAL NEPHRECTOMY Left 02/24/2017   Procedure: XI ROBOTIC ASSITED LEFT  RADICAL NEPHRECTOMY;  Surgeon: Alexis Frock, MD;  Location: [facility];  Service: Urology;  Laterality: Left;   TUBAL LIGATION     VAGINAL HYSTERECTOMY      There were no vitals filed for this visit.    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 Pt does not  realize she has to urinate until she has to go. Daily fluid intake: 64 oz water, no coffee, soda,  teas,alcohol. Sometimes she is not able to make it to the bathroom before leakage. In 2009, pt had a hysterectomy due to fibroids and mesh for the bladder. Pt was able to  be active afterwards with no  leakage. Leakage has returned gradually. Pt wears thin pads. and changes 2 x day. Pt used to perform planks, sit ups crunches.  Pt tried  pessary but discontinued it due to pain.      2) straining with bowel movments. BMs occur daily.     3) hip/ SIJ pain:  Currently, pt is having pain  by L glut area up to low back/SIJ area (pt points to this area). Right glut also hurts. This pain started 3 months ago.   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  After sitting in couch/ chair and is getting up, pt also experiences the pain B glut. No pain with sitting. Pain occurs with stair climbing and pt uses R LE which is less painful leg. Pt finds there is less pain when leaning onthe R and L leg is out more like triangle.  Pain is not radiating.  pt had greater trochanter pain in Jan 2021 and has been seeing a PT for 3 visits. Pt had injections and it helped her to move and walk again  but it wore off for 3 months. Pt is having pain again and 2nd injections did not work. Pt reports 80% improvement with PT. Pt has a few more appointments left with PT. Pt would return to brisk walk but is having hip/ SIJ pain. Her stride is much shorter and she is limping      Pertinent History L kidney cancer  removed 2/2 Kidney cancer 2/ 2019,  A-fib, Fall on to tailbone a year ago. 3 vaginal deliveries with episiotomy. HTN, asthma, Hx of squamous cell carcinoma of skin ,high cholesterol     Patient Stated Goals get back to regular walking and sit and stand without hip/ SIJ pain, to strengthen pelvic area                [facility] PT Assessment - 12/16/20 0846       Assessment   Referring Provider (PT) Wannetta Sender   MD      Precautions   Precautions None      Restrictions   Weight Bearing Restrictions No      Observation/Other Assessments   Scoliosis R shoulder/ L iliac crest higher, R thoracic convex, L lumbar convex curves      Strength   Overall Strength Comments B hip abd 4/5 , BLE 4/5      Palpation   Spinal mobility tightness. hypomobile T/L junction L,medial scapula L    Palpation comment abdominal scar restrictions                        Objective measurements completed on examination: See above findings.     Pelvic Floor Special Questions - 12/15/20 1655     Diastasis Recti 3 fingers above umbilcus              [facility] Adult PT Treatment/Exercise - 12/16/20 0846       Ambulation/Gait   Gait velocity pre Tx: 0.9 m/s  post Tx: 1.14  m/s    Gait Comments limited trunk rotation, decreased stance on R, L hip hike  ( post Tx: more levelled pelvic girdle, more equal stance phase but limited thoracic rotation remains) ,      Therapeutic Activites    Other Therapeutic Activities explaiend anatomy. physiology of pelvic girdle and spine, IAP system for pain and pelvic Sx      Manual Therapy   Manual therapy comments STM/MWM at T/L junction to promote  mobility and realignment at convex curve  PT Long Term Goals - 12/15/20 1641       PT LONG TERM GOAL #1   Title Pt will decrease wearing thin pads. from 2 x a day to < 1 x day in order to improve hygiene and QOL    Time 8    Period Weeks    Status New    Target Date 02/10/21      PT LONG TERM GOAL #2   Title Pt will report no more straining with bowel movments in order to minimize pelvic floor dysfunctions    Time 4    Period Weeks    Status New    Target Date 01/13/21      PT LONG TERM GOAL #3   Title Pt will report no  L glut/ SIJ pain when getting out of SUV and to lie in bed with comfort in order to improve ADLs    Time 10    Period Weeks    Status New    Target Date 02/24/21      PT LONG TERM GOAL #4   Title increase 0.9 m/s gait to > 1.2 m/s and demo more equal stance phase and reciporcal gait pattern  compared to decreased R stance phase, L hip hike and report no hip/ SIJ pain    Time 4    Period Weeks    Status New    Target Date 01/13/21      PT LONG TERM GOAL #5   Title Demo decreased 3 fingers width above umbilicus to < 1 fingers to optimize IAP system to progress to deep core and pelvic floor training    Time 2    Period Weeks    Target Date 12/30/20      Additional Long Term Goals   Additional Long Term Goals Yes      PT LONG TERM GOAL #6   Title Pt will improve FOTO score increase of > 5 pt in categories of Pelvic pain, Urinary, BOwel  in order to improve function    Baseline Pelvic pain 63pt, PFDI 8, PFDI  Urinary    Time 10    Period Weeks    Status New    Target Date 02/24/21                    Plan - 12/16/20 3888     Clinical Impression Statement  Pt is a  65  yo  who presents with urinary leakage, straining with bowel movements, and hip/ SIJ pain which impact her QOL and ADLs.   Pt's musculoskeletal assessment revealed uneven pelvic girdle height, scoliotic curves, limited spinal /pelvic  mobility, dyscoordination and strength of pelvic floor mm, diastasis recti, gait deviations, slowed gait speed, abdominal scar restrictions, and  poor body mechanics which places strain on the abdominal/pelvic floor mm.  Contributing factors  include Hx of hysterectomy L kidney cancer removed 2/2 Kidney cancer 2/ 2019,  fall on to tailbone a year ago. 3 vaginal deliveries with episiotomy, asthma.  These are deficits that indicate an ineffective intraabdominal pressure system associated with increased risk for pt's Sx.  Pt will benefit from proper coordination training and education on fitness and functional positions in order to yield greater outcomes as pt performed sit-ups/ crunches in the past.  Advised pt to not perform sit-ups and crunches as these movement patterns lead to more downward forces on the pelvic floor, negatively impacting abdominopelvic/spinal dysfunctions.    Pt was provided education on etiology of Sx with anatomy, physiology explanation with images along with the benefits of customized pelvic PT Tx based on pt's medical conditions and musculoskeletal deficits.  Explained the physiology of deep core mm coordination and roles of pelvic floor function in urination, defecation, sexual function, and postural control with deep core mm system.   Following Tx today which pt tolerated without complaints, pt demo'd equal alignment of pelvic girdle and demo'd more reciprocal gait pattern. Continue to address spinal deviations and prescribe customized one-sided exercises at next session. Plan to address  DRA and progress to deep core strengthening to improve IAP system for pelvic girdlel stability and continence.   Pt benefits from skilled PT.    Personal Factors and Comorbidities Comorbidity 3+    Comorbidities hysterectomy, L kidney cancer  removed 2/2 Kidney cancer 2/ 2019,  A-fib, Fall on to tailbone a year ago. 3 vaginal deliveries with episiotomy. HTN, asthma, Hx of squamous cell carcinoma of skin ,high cholesterol    Examination-Activity Limitations Continence;Stand;Sit;Toileting    Stability/Clinical Decision Making Evolving/Moderate complexity    Clinical Decision Making Moderate    Rehab Potential Good    PT Frequency 1x / week    PT Duration Other (comment)   10   PT Treatment/Interventions ADLs/Self Care Home Management;Moist Heat;Traction;Therapeutic activities;Therapeutic exercise;Neuromuscular re-education;Manual techniques;Cryotherapy;Biofeedback;Scar mobilization;Dry needling;Energy conservation;Taping;Gait training;Stair training;Functional mobility training;Balance training;Joint Manipulations;Spinal Manipulations    Consulted and Agree with Plan of Care Patient             Patient will benefit from skilled therapeutic intervention in order to improve the following deficits and impairments:  Decreased balance, Difficulty walking, Impaired flexibility, Hypomobility, Decreased strength, Decreased range of motion, Decreased endurance, Decreased activity tolerance, Decreased coordination, Increased muscle spasms, Hypermobility, Decreased scar mobility, Abnormal gait, Impaired sensation, Improper body mechanics, Pain, Postural dysfunction, Decreased mobility, Decreased safety awareness, Increased fascial restricitons  Visit Diagnosis: Other abnormalities of gait and mobility  Other idiopathic scoliosis, thoracolumbar region - Plan: CT CORONARY MORPH W/CTA COR W/SCORE W/CA W/CM &/OR WO/CM  Sacrococcygeal disorders, not elsewhere classified  Diastasis recti     Problem  List Patient Active Problem List   Diagnosis Date Noted   Chest pain of uncertain etiology 00/86/7619   Paroxysmal atrial fibrillation ([place]) 05/13/2020   Secondary hypercoagulable state ([place]) 05/13/2020   Atrial fibrillation with RVR ([place]) 02/12/2020   Mixed hyperlipidemia 01/28/2019   Stage 3 chronic kidney disease ([place]) 09/25/2018   Moderate persistent asthma 09/05/2018   Eosinophilia 09/05/2018   Essential hypertension 07/27/2018   Perennial allergic rhinitis with seasonal variation 07/27/2018   Pure hypercholesterolemia 07/27/2018   Chemotherapy induced nausea and vomiting 07/01/2017   Chemotherapy induced diarrhea 07/01/2017   Acute renal insufficiency 07/01/2017   Thyroid dysfunction 07/01/2017   Renal cell carcinoma of left kidney ([place]) 03/21/2017   Cancer of left kidney ([place]) 02/24/2017   Esophagitis, reflux 10/21/2013    Jerl Mina, PT 12/16/2020, 8:49 AM  [place], [place], [address] Phone:  [phone]   Fax:  [phone]  Name: AUDRY KAUZLARICH MRN: [id number] Date of Birth: 08/09/1955

## 2020-12-21 ENCOUNTER — Other Ambulatory Visit: Payer: Self-pay

## 2020-12-21 ENCOUNTER — Ambulatory Visit (INDEPENDENT_AMBULATORY_CARE_PROVIDER_SITE_OTHER): Payer: Medicare Other | Admitting: Dermatology

## 2020-12-21 DIAGNOSIS — L719 Rosacea, unspecified: Secondary | ICD-10-CM

## 2020-12-21 MED ORDER — DOXYCYCLINE HYCLATE 50 MG PO CAPS
50.0000 mg | ORAL_CAPSULE | Freq: Every evening | ORAL | 2 refills | Status: DC
Start: 2020-12-21 — End: 2021-03-01

## 2020-12-21 NOTE — Progress Notes (Signed)
° °  Follow-Up Visit   Subjective  Heidi Torres is a 65 y.o. adult who presents for the following: Follow-up (Patient here concerning some red bumpy on cheeks and nose. Patient reports just started. ).  The following portions of the chart were reviewed this encounter and updated as appropriate:  Tobacco   Allergies   Meds   Problems   Med Hx   Surg Hx   Fam Hx      Review of Systems: No other skin or systemic complaints except as noted in HPI or Assessment and Plan.  Objective  Well appearing patient in no apparent distress; mood and affect are within normal limits.  A focused examination was performed including face. Relevant physical exam findings are noted in the Assessment and Plan.  mid face and nose Papules and erythema of cheeks    Assessment & Plan  Rosacea mid face and nose  Rosacea is a chronic progressive skin condition usually affecting the face of adults, causing redness and/or acne bumps. It is treatable but not curable. It sometimes affects the eyes (ocular rosacea) as well. It may respond to topical and/or systemic medication and can flare with stress, sun exposure, alcohol, exercise and some foods.  Daily application of broad spectrum spf 30+ sunscreen to face is recommended to reduce flares.  Will prescribe Skin Medicinals metronidazole/ivermectin/azelaic acid twice daily as needed to affected areas on the face. The patient was advised this is not covered by insurance since it is made by a compounding pharmacy. They will receive an email to check out and the medication will be mailed to their home.  Use for a month  Start doxycycline 50 by mouth daily with evening meal  If doing well after 2 - 4 weeks can stop using oral doxycycline, but continue topical  Doxycycline should be taken with food to prevent nausea. Do not lay down for 30 minutes after taking. Be cautious with sun exposure and use good sun protection while on this medication. Pregnant women should not  take this medication.   doxycycline (VIBRAMYCIN) 50 MG capsule - mid face and nose Take 1 capsule (50 mg total) by mouth every evening. Take with food  Return for 2 - 3 months rosacea . IRuthell Rummage, CMA, am acting as scribe for Sarina Ser, MD. Documentation: I have reviewed the above documentation for accuracy and completeness, and I agree with the above.  Sarina Ser, MD

## 2020-12-21 NOTE — Patient Instructions (Addendum)
Doxycycline 50 mg take 1 by mouth daily with food.    If doing well after 2 - 4 weeks can stop using doxycycline   Doxycycline should be taken with food to prevent nausea. Do not lay down for 30 minutes after taking. Be cautious with sun exposure and use good sun protection while on this medication. Pregnant women should not take this medication.   Instructions for [facility] Medications  One or more of your medications was sent to the [facility] mail order compounding pharmacy. You will receive an email from them and can purchase the medicine through that link. It will then be mailed to your home at the address you confirmed. If for any reason you do not receive an email from them, please check your spam folder. If you still do not find the email, please let us know. [facility] phone number is [phone].    Rosacea  What is rosacea? Rosacea (say: ro-zay-sha) is a common skin disease that usually begins as a trend of flushing or blushing easily.  As rosacea progresses, a persistent redness in the center of the face will develop and may gradually spread beyond the nose and cheeks to the forehead and chin.  In some cases, the ears, chest, and back could be affected.  Rosacea may appear as tiny blood vessels or small red bumps that occur in crops.  Frequently they can contain pus, and are called pustules.  If the bumps do not contain pus, they are referred to as papules.  Rarely, in prolonged, untreated cases of rosacea, the oil glands of the nose and cheeks may become permanently enlarged.  This is called rhinophyma, and is seen more frequently in men.  Signs and Risks In its beginning stages, rosacea tends to come and go, which makes it difficult to recognize.  It can start as intermittent flushing of the face.  Eventually, blood vessels may become permanently visible.  Pustules and papules can appear, but can be mistaken for adult acne.  People of all races, ages, genders and  ethnic groups are at risk of developing rosacea.  However, it is more common in women (especially around menopause) and adults with fair skin between the ages of 36 and 67.  Treatment Dermatologists typically recommend a combination of treatments to effectively manage rosacea.  Treatment can improve symptoms and may stop the progression of the rosacea.  Treatment may involve both topical and oral medications.  The tetracycline antibiotics are often used for their anti-inflammatory effect; however, because of the possibility of developing antibiotic resistance, they should not be used long term at full dose.  For dilated blood vessels the options include electrodessication (uses electric current through a small needle), laser treatment, and cosmetics to hide the redness.   With all forms of treatment, improvement is a slow process, and patients may not see any results for the first 3-4 weeks.  It is very important to avoid the sun and other triggers.  Patients must wear sunscreen daily.  Skin Care Instructions: Cleanse the skin with a mild soap such as CeraVe cleanser, Cetaphil cleanser, or Dove soap once or twice daily as needed. Moisturize with Eucerin Redness Relief Daily Perfecting Lotion (has a subtle green tint), CeraVe Moisturizing Cream, or Oil of Olay Daily Moisturizer with sunscreen every morning and/or night as recommended. Makeup should be non-comedogenic (wont clog pores) and be labeled for sensitive skin. Good choices for cosmetics are: Neutrogena, Almay, and Physicians Formula.  Any product with a green  tint tends to offset a red complexion. If your eyes are dry and irritated, use artificial tears 2-3 times per day and cleanse the eyelids daily with baby shampoo.  Have your eyes examined at least every 2 years.  Be sure to tell your eye doctor that you have rosacea. Alcoholic beverages tend to cause flushing of the skin, and may make rosacea worse. Always wear sunscreen, protect your  skin from extreme hot and cold temperatures, and avoid spicy foods, hot drinks, and mechanical irritation such as rubbing, scrubbing, or massaging the face.  Avoid harsh skin cleansers, cleansing masks, astringents, and exfoliation. If a particular product burns or makes your face feel tight, then it is likely to flare your rosacea. If you are having difficulty finding a sunscreen that you can tolerate, you may try switching to a chemical-free sunscreen.  These are ones whose active ingredient is zinc oxide or titanium dioxide only.  They should also be fragrance free, non-comedogenic, and labeled for sensitive skin. Rosacea triggers may vary from person to person.  There are a variety of foods that have been reported to trigger rosacea.  Some patients find that keeping a diary of what they were doing when they flared helps them avoid triggers. If You Need Anything After Your Visit  If you have any questions or concerns for your doctor, please call our main line at [phone] and press option 4 to reach your doctor's medical assistant. If no one answers, please leave a voicemail as directed and we will return your call as soon as possible. Messages left after 4 pm will be answered the following business day.   You may also send [place] a message via [place]. We typically respond to MyChart messages within 1-2 business days.  For prescription refills, please ask your pharmacy to contact our office. Our fax number is [phone].  If you have an urgent issue when the clinic is closed that cannot wait until the next business day, you can page your doctor at the number below.    Please note that while we do our best to be available for urgent issues outside of office hours, we are not available 24/7.   If you have an urgent issue and are unable to reach [place], you may choose to seek medical care at your doctor's office, retail clinic, urgent care center, or emergency room.  If you have a medical emergency,  please immediately call 911 or go to the emergency department.  Pager Numbers  - Dr. Nehemiah Massed: [phone]  - Dr. Laurence Ferrari: [phone]  - Dr. Nicole Kindred: [phone]  In the event of inclement weather, please call our main line at [phone] for an update on the status of any delays or closures.  Dermatology Medication Tips: Please keep the boxes that topical medications come in in order to help keep track of the instructions about where and how to use these. Pharmacies typically print the medication instructions only on the boxes and not directly on the medication tubes.   If your medication is too expensive, please contact our office at [phone] option 4 or send [place] a message through [place].   We are unable to tell what your co-pay for medications will be in advance as this is different depending on your insurance coverage. However, we may be able to find a substitute medication at lower cost or fill out paperwork to get insurance to cover a needed medication.   If a prior authorization is required to get your medication covered by  your insurance company, please allow [place] 1-2 business days to complete this process.  Drug prices often vary depending on where the prescription is filled and some pharmacies may offer cheaper prices.  The website www.goodrx.com contains coupons for medications through different pharmacies. The prices here do not account for what the cost may be with help from insurance (it may be cheaper with your insurance), but the website can give you the price if you did not use any insurance.  - You can print the associated coupon and take it with your prescription to the pharmacy.  - You may also stop by our office during regular business hours and pick up a GoodRx coupon card.  - If you need your prescription sent electronically to a different pharmacy, notify our office through [facility] or by phone at [phone] option 4.     Si Usted Necesita Algo  Despus de Su Visita  Tambin puede enviarnos un mensaje a travs de Pharmacist, community. Por lo general respondemos a los mensajes de MyChart en el transcurso de 1 a 2 das hbiles.  Para renovar recetas, por favor pida a su farmacia que se ponga en contacto con nuestra oficina. Harland Dingwall de fax es [place] [phone].  Si tiene un asunto urgente cuando la clnica est cerrada y que no puede esperar hasta el siguiente da hbil, puede llamar/localizar a su doctor(a) al nmero que aparece a continuacin.   Por favor, tenga en cuenta que aunque hacemos todo lo posible para estar disponibles para asuntos urgentes fuera del horario de [place], no estamos disponibles las 24 horas del da, los 7 das de la [place].   Si tiene un problema urgente y no puede comunicarse con nosotros, puede optar por buscar atencin mdica  en el consultorio de su doctor(a), en una clnica privada, en un centro de atencin urgente o en una sala de emergencias.  Si tiene Engineering geologist, por favor llame inmediatamente al 911 o vaya a la sala de emergencias.  Nmeros de bper  - Dr. Nehemiah Massed: [phone]  - Dra. Moye: [phone]  - Dra. Nicole Kindred: [phone]  En caso de inclemencias del [place], por favor llame a Johnsie Kindred principal al [phone] para una actualizacin sobre el [place] de cualquier retraso o cierre.  Consejos para la medicacin en dermatologa: Por favor, guarde las cajas en las que vienen los medicamentos de uso tpico para ayudarle a seguir las instrucciones sobre dnde y cmo usarlos. Las farmacias generalmente imprimen las instrucciones del medicamento slo en las cajas y no directamente en los tubos del [place].   Si su medicamento es muy caro, por favor, pngase en contacto con Zigmund Daniel llamando al [phone] y presione la opcin 4 o envenos un mensaje a travs de Pharmacist, community.   No podemos decirle cul ser su copago por los medicamentos por adelantado ya que esto es diferente  dependiendo de la cobertura de su seguro. Sin embargo, es posible que podamos encontrar un medicamento sustituto a Electrical engineer un formulario para que el seguro cubra el medicamento que se considera necesario.   Si se requiere una autorizacin previa para que su compaa de seguros [place] su medicamento, por favor permtanos de 1 a 2 das hbiles para completar este proceso.  Los precios de los medicamentos varan con frecuencia dependiendo del Environmental consultant de dnde se surte la receta y alguna farmacias pueden ofrecer precios ms baratos.  El sitio web www.goodrx.com tiene cupones para medicamentos de Airline pilot. Los precios aqu no tienen  en cuenta lo que podra costar con la ayuda del seguro (puede ser ms barato con su seguro), pero el sitio web puede darle el precio si no Field seismologist.  - Puede imprimir el cupn correspondiente y llevarlo con su receta a la farmacia.  - Tambin puede pasar por nuestra oficina durante el horario de atencin regular y Charity fundraiser una tarjeta de cupones de GoodRx.  - Si necesita que su receta se enve electrnicamente a una farmacia diferente, informe a nuestra oficina a travs de MyChart de [place] o por telfono llamando al [phone] y presione la opcin 4.

## 2020-12-22 ENCOUNTER — Ambulatory Visit: Payer: Medicare Other | Admitting: Physical Therapy

## 2020-12-22 DIAGNOSIS — M6208 Separation of muscle (nontraumatic), other site: Secondary | ICD-10-CM

## 2020-12-22 DIAGNOSIS — M4125 Other idiopathic scoliosis, thoracolumbar region: Secondary | ICD-10-CM

## 2020-12-22 DIAGNOSIS — M533 Sacrococcygeal disorders, not elsewhere classified: Secondary | ICD-10-CM

## 2020-12-22 DIAGNOSIS — R2689 Other abnormalities of gait and mobility: Secondary | ICD-10-CM

## 2020-12-22 NOTE — Patient Instructions (Signed)
°   Side of hip stretch:  Reclined twist for hips and side of the hips/ legs  Lay on your back, knees bend Scoot hips to the L , leave shoulders in place Drop knees to the Rside resting onto pillows to keep leg at the same width of hips Pillow under R thigh to minimize too much strain   __  Multifidis twist  Band is on doorknob: stand further away from door (facing perpendicular)   Twisting trunk without moving the hips and knees Hold band at the level of ribcage, elbows bent,shoulder blades roll back and down like squeezing a pencil under armpit    Exhale twist,.10-15 deg away from door without moving your hips/ knees. Continue to maintain equal weight through legs. Keep knee unlocked.   20 reps

## 2020-12-22 NOTE — Therapy (Signed)
[place] MAIN [facility] SERVICES [address] [place], [place], [address] Phone: [phone]   Fax:  [phone]  Physical Therapy Treatment  Patient Details  Name: Heidi Torres MRN: [id number] Date of Birth: 1955-05-15 Referring Provider (PT): Wannetta Sender   MD   Encounter Date: 12/22/2020   PT End of Session - 12/22/20 1359     Visit Number 2    Number of Visits 10    Date for PT Re-Evaluation 02/24/21    PT Start Time 1409    PT Stop Time 1505    PT Time Calculation (min) 56 min    Activity Tolerance Patient tolerated treatment well;No increased pain    Behavior During Therapy WFL for tasks assessed/performed             Past Medical History:  Diagnosis Date   Asthma    Atrial fibrillation ([place])    Cataract cortical, senile    Chronic kidney disease    Complication of anesthesia    Eosinophilic esophagitis    Esophageal stricture    GERD (gastroesophageal reflux disease)    Headache    occular HA   History of hiatal hernia    Hx of dysplastic nevus    multiple sites   Hx of squamous cell carcinoma of skin 06/22/2015   R infrascapular, SCC in situ   Hyperlipidemia    Hypertension    Irregular heart beat    pac's and pvc's   Pneumonia    11-2016   PONV (postoperative nausea and vomiting)    Renal cell cancer, left (HCC)    Status post dilation of esophageal narrowing     Past Surgical History:  Procedure Laterality Date   ABLATION     prior to hysterectomy   CYSTOSCOPY/RETROGRADE/URETEROSCOPY Left 01/20/2017   Procedure: CYSTOSCOPY/RETROGRADE/URETEROSCOPY/ BIOPSY,BLADDER BIOPSY PLACEMENT STENT LEFT URETER;  Surgeon: Festus Aloe, MD;  Location: [facility];  Service: Urology;  Laterality: Left;   DILATION AND CURETTAGE OF UTERUS     ESOPHAGOGASTRODUODENOSCOPY N/A 12/16/2019   Procedure: ESOPHAGOGASTRODUODENOSCOPY (EGD);  Surgeon: Lesly Rubenstein, MD;  Location: [facility] ENDOSCOPY;  Service: Endoscopy;   Laterality: N/A;   ESOPHAGOGASTRODUODENOSCOPY (EGD) WITH PROPOFOL N/A 01/09/2019   Procedure: ESOPHAGOGASTRODUODENOSCOPY (EGD) WITH PROPOFOL;  Surgeon: Toledo, Benay Pike, MD;  Location: [facility] ENDOSCOPY;  Service: Gastroenterology;  Laterality: N/A;   NASAL ENDOSCOPY WITH EPISTAXIS CONTROL     ROBOTIC ASSITED PARTIAL NEPHRECTOMY Left 02/24/2017   Procedure: XI ROBOTIC ASSITED LEFT  RADICAL NEPHRECTOMY;  Surgeon: Alexis Frock, MD;  Location: [facility];  Service: Urology;  Laterality: Left;   TUBAL LIGATION     VAGINAL HYSTERECTOMY      There were no vitals filed for this visit.   Subjective Assessment - 12/22/20 1312     Subjective Pt reported no questions from last week. Pt left last session with less pain. Pain returned    Pertinent History hysterectomy, L kidney cancer  removed 2/2 Kidney cancer 2/ 2019,  A-fib, Fall on to tailbone a year ago. 3 vaginal deliveries with episiotomy. HTN, asthma, Hx of squamous cell carcinoma of skin ,high cholesterol    Patient Stated Goals get back to regular walking and sit and stand without hip/ SIJ pain, to strengthen pelvic area                [facility] PT Assessment - 12/22/20 1321       Palpation   Spinal mobility ncreased lumbar lordosis  SI assessment  hypomobile L SIJ, lacking nutation, flexed coccyx, tightness around ischial rami , point tenderness at S2 level of SIJ/ gluts      Ambulation/Gait   Gait velocity 1.0 m/s    Gait Comments decreased stance on R ,   ( postTx: equal WBing on stance )                           [facility] Adult PT Treatment/Exercise - 12/22/20 1321       Neuro Re-ed    Neuro Re-ed Details  cued for supine  stretch and multidifis strengthening with green band in seated position      Modalities   Modalities Moist Heat      Moist Heat Therapy   Number Minutes Moist Heat 5 Minutes    Moist Heat Location --   supported lower trunk rotation position to stretch L hip     Manual Therapy    Manual therapy comments PA mob at SIJ, AP mob Grade III, rotational mob , superior glide of coccyx, STM/MWM at mm attached at ischial rami                          PT Long Term Goals - 12/15/20 1641       PT LONG TERM GOAL #1   Title Pt will decrease wearing thin pads. from 2 x a day to < 1 x day in order to improve hygiene and QOL    Time 8    Period Weeks    Status New    Target Date 02/10/21      PT LONG TERM GOAL #2   Title Pt will report no more straining with bowel movments in order to minimize pelvic floor dysfunctions    Time 4    Period Weeks    Status New    Target Date 01/13/21      PT LONG TERM GOAL #3   Title Pt will report no  L glut/ SIJ pain when getting out of SUV and to lie in bed with comfort in order to improve ADLs    Time 10    Period Weeks    Status New    Target Date 02/24/21      PT LONG TERM GOAL #4   Title increase 0.9 m/s gait to > 1.2 m/s and demo more equal stance phase and reciporcal gait pattern  compared to decreased R stance phase, L hip hike and report no hip/ SIJ pain    Time 4    Period Weeks    Status New    Target Date 01/13/21      PT LONG TERM GOAL #5   Title Demo decreased 3 fingers width above umbilicus to < 1 fingers to optimize IAP system to progress to deep core and pelvic floor training    Time 2    Period Weeks    Target Date 12/30/20      Additional Long Term Goals   Additional Long Term Goals Yes      PT LONG TERM GOAL #6   Title Pt will improve FOTO score increase of > 5 pt in categories of Pelvic pain, Urinary, BOwel  in order to improve function    Baseline Pelvic pain 63pt, PFDI 8, PFDI  Urinary    Time 10    Period Weeks    Status New    Target Date  02/24/21                   Plan - 12/22/20 1819     Clinical Impression Statement Pt demo'd more levelled pelvis which is good carry over from last session. Provided manual Tx to promote  mobility at SIJ and nutation of sacrum and  extension of coccyx. Pt demo'd less  tenderness post Tx and more mobility of SIJ. Added  multifidis strengthening today with cues to improve posterior lumbar strength given Hx of kidney removed on L side 2/2 cancer.   Plan to progress to deep core training next session to promote IAP system efficiency given Hx of hysterectomy.. Pt continues to benefit from skilled PT.    Personal Factors and Comorbidities Comorbidity 3+    Comorbidities hysterectomy, L kidney cancer  removed 2/2 Kidney cancer 2/ 2019,  A-fib, Fall on to tailbone a year ago. 3 vaginal deliveries with episiotomy. HTN, asthma, Hx of squamous cell carcinoma of skin ,high cholesterol    Examination-Activity Limitations Continence;Stand;Sit;Toileting    Stability/Clinical Decision Making Evolving/Moderate complexity    Rehab Potential Good    PT Frequency 1x / week    PT Duration Other (comment)   10   PT Treatment/Interventions ADLs/Self Care Home Management;Moist Heat;Traction;Therapeutic activities;Therapeutic exercise;Neuromuscular re-education;Manual techniques;Cryotherapy;Biofeedback;Scar mobilization;Dry needling;Energy conservation;Taping;Gait training;Stair training;Functional mobility training;Balance training;Joint Manipulations;Spinal Manipulations    Consulted and Agree with Plan of Care Patient             Patient will benefit from skilled therapeutic intervention in order to improve the following deficits and impairments:  Decreased balance, Difficulty walking, Impaired flexibility, Hypomobility, Decreased strength, Decreased range of motion, Decreased endurance, Decreased activity tolerance, Decreased coordination, Increased muscle spasms, Hypermobility, Decreased scar mobility, Abnormal gait, Impaired sensation, Improper body mechanics, Pain, Postural dysfunction, Decreased mobility, Decreased safety awareness, Increased fascial restricitons  Visit Diagnosis: Other idiopathic scoliosis, thoracolumbar  region  Sacrococcygeal disorders, not elsewhere classified  Other abnormalities of gait and mobility  Diastasis recti     Problem List Patient Active Problem List   Diagnosis Date Noted   Chest pain of uncertain etiology 81/19/1478   Paroxysmal atrial fibrillation ([place]) 05/13/2020   Secondary hypercoagulable state ([place]) 05/13/2020   Atrial fibrillation with RVR ([place]) 02/12/2020   Mixed hyperlipidemia 01/28/2019   Stage 3 chronic kidney disease ([place]) 09/25/2018   Moderate persistent asthma 09/05/2018   Eosinophilia 09/05/2018   Essential hypertension 07/27/2018   Perennial allergic rhinitis with seasonal variation 07/27/2018   Pure hypercholesterolemia 07/27/2018   Chemotherapy induced nausea and vomiting 07/01/2017   Chemotherapy induced diarrhea 07/01/2017   Acute renal insufficiency 07/01/2017   Thyroid dysfunction 07/01/2017   Renal cell carcinoma of left kidney ([place]) 03/21/2017   Cancer of left kidney ([place]) 02/24/2017   Esophagitis, reflux 10/21/2013    Jerl Mina, PT 12/22/2020, 6:21 PM  [place] MAIN [facility] SERVICES [address] [place], [place], [address] Phone: [phone]   Fax:  [phone]  Name: Heidi Torres MRN: [id number] Date of Birth: [birth year]

## 2020-12-30 ENCOUNTER — Encounter: Payer: Medicare Other | Admitting: Physical Therapy

## 2020-12-31 ENCOUNTER — Ambulatory Visit: Payer: Medicare Other | Admitting: Physical Therapy

## 2020-12-31 ENCOUNTER — Other Ambulatory Visit: Payer: Self-pay

## 2020-12-31 DIAGNOSIS — M4125 Other idiopathic scoliosis, thoracolumbar region: Secondary | ICD-10-CM

## 2020-12-31 DIAGNOSIS — R2689 Other abnormalities of gait and mobility: Secondary | ICD-10-CM | POA: Diagnosis not present

## 2020-12-31 DIAGNOSIS — M533 Sacrococcygeal disorders, not elsewhere classified: Secondary | ICD-10-CM

## 2020-12-31 DIAGNOSIS — M6208 Separation of muscle (nontraumatic), other site: Secondary | ICD-10-CM

## 2020-12-31 NOTE — Therapy (Signed)
[place] [facility] [address] [place], [place], [address] Phone: [phone]   Fax:  [phone]  Physical Therapy Treatment  Patient Details  Name: LIZZETT NOBILE MRN: [id number] Date of Birth: 1955/10/01 Referring Provider (PT): Wannetta Sender   MD   Encounter Date: 12/31/2020   PT End of Session - 12/31/20 1331     Visit Number 3    Number of Visits 10    PT Start Time 1300    PT Stop Time 1400    PT Time Calculation (min) 60 min             Past Medical History:  Diagnosis Date   Asthma    Atrial fibrillation ([place])    Cataract cortical, senile    Chronic kidney disease    Complication of anesthesia    Eosinophilic esophagitis    Esophageal stricture    GERD (gastroesophageal reflux disease)    Headache    occular HA   History of hiatal hernia    Hx of dysplastic nevus    multiple sites   Hx of squamous cell carcinoma of skin 06/22/2015   R infrascapular, SCC in situ   Hyperlipidemia    Hypertension    Irregular heart beat    pac's and pvc's   Pneumonia    11-2016   PONV (postoperative nausea and vomiting)    Renal cell cancer, left ([place])    Status post dilation of esophageal narrowing     Past Surgical History:  Procedure Laterality Date   ABLATION     prior to hysterectomy   CYSTOSCOPY/RETROGRADE/URETEROSCOPY Left 01/20/2017   Procedure: CYSTOSCOPY/RETROGRADE/URETEROSCOPY/ BIOPSY,BLADDER BIOPSY PLACEMENT STENT LEFT URETER;  Surgeon: Festus Aloe, MD;  Location: [facility];  Service: Urology;  Laterality: Left;   DILATION AND CURETTAGE OF UTERUS     ESOPHAGOGASTRODUODENOSCOPY N/A 12/16/2019   Procedure: ESOPHAGOGASTRODUODENOSCOPY (EGD);  Surgeon: Lesly Rubenstein, MD;  Location: [facility] ENDOSCOPY;  Service: Endoscopy;  Laterality: N/A;   ESOPHAGOGASTRODUODENOSCOPY (EGD) WITH PROPOFOL N/A 01/09/2019   Procedure: ESOPHAGOGASTRODUODENOSCOPY (EGD) WITH PROPOFOL;  Surgeon: Toledo, Benay Pike, MD;   Location: [facility] ENDOSCOPY;  Service: Gastroenterology;  Laterality: N/A;   NASAL ENDOSCOPY WITH EPISTAXIS CONTROL     ROBOTIC ASSITED PARTIAL NEPHRECTOMY Left 02/24/2017   Procedure: XI ROBOTIC ASSITED LEFT  RADICAL NEPHRECTOMY;  Surgeon: Alexis Frock, MD;  Location: [facility];  Service: Urology;  Laterality: Left;   TUBAL LIGATION     VAGINAL HYSTERECTOMY      There were no vitals filed for this visit.   Subjective Assessment - 12/31/20 1311     Subjective Pt report pain is located at both sides of sacral area. 4/10. Ibprofen stops the pain but pt has not let her MD know that she took this medication which she was told not to take it.    Pertinent History hysterectomy, L kidney cancer  removed 2/2 Kidney cancer 2/ 2019,  A-fib, Fall on to tailbone a year ago. 3 vaginal deliveries with episiotomy. HTN, asthma, Hx of squamous cell carcinoma of skin ,high cholesterol    Patient Stated Goals get back to regular walking and sit and stand without hip/ SIJ pain, to strengthen pelvic area                [facility] PT Assessment - 12/31/20 1812       Coordination   Coordination and Movement Description chest breathing, ab overuse with deep core coordination      Strength  Overall Strength Comments B hip abd 3/5      Palpation   Spinal mobility R sideflexion with pain at L PSIS, no pain at R PSIS with L sideflexion      Bed Mobility   Bed Mobility --   lifting head                          [facility] Adult PT Treatment/Exercise - 12/31/20 1815       Therapeutic Activites    Other Therapeutic Activities explaiend physiology of deep core and  implications of past surgeries impacting IAP system      Neuro Re-ed    Neuro Re-ed Details  cued for log rolling, bed mobility to minimize straining abdominal wall, cued for  deep core HEP and clam shell      Exercises   Other Exercises  see pt instructions                          PT Long Term Goals - 12/15/20  1641       PT LONG TERM GOAL #1   Title Pt will decrease wearing thin pads. from 2 x a day to < 1 x day in order to improve hygiene and QOL    Time 8    Period Weeks    Status New    Target Date 02/10/21      PT LONG TERM GOAL #2   Title Pt will report no more straining with bowel movments in order to minimize pelvic floor dysfunctions    Time 4    Period Weeks    Status New    Target Date 01/13/21      PT LONG TERM GOAL #3   Title Pt will report no  L glut/ SIJ pain when getting out of SUV and to lie in bed with comfort in order to improve ADLs    Time 10    Period Weeks    Status New    Target Date 02/24/21      PT LONG TERM GOAL #4   Title increase 0.9 m/s gait to > 1.2 m/s and demo more equal stance phase and reciporcal gait pattern  compared to decreased R stance phase, L hip hike and report no hip/ SIJ pain    Time 4    Period Weeks    Status New    Target Date 01/13/21      PT LONG TERM GOAL #5   Title Demo decreased 3 fingers width above umbilicus to < 1 fingers to optimize IAP system to progress to deep core and pelvic floor training    Time 2    Period Weeks    Target Date 12/30/20      Additional Long Term Goals   Additional Long Term Goals Yes      PT LONG TERM GOAL #6   Title Pt will improve FOTO score increase of > 5 pt in categories of Pelvic pain, Urinary, BOwel  in order to improve function    Baseline Pelvic pain 63pt, PFDI 8, PFDI  Urinary    Time 10    Period Weeks    Status New    Target Date 02/24/21                   Plan - 12/31/20 1811     Clinical Impression Statement Pt continues to show levelled pelvic girdle which  deemed pt ready for deep core strengthening. Pt required cues for less abdominal straining. Initiated hip abduction strengthening  due to weakness. Cued for log rolling, bed mobility to minimize straining abdominal wall. Cued for clam shell and bed mobility / log rollling to minimize worsening of DRA.                                 Focused on building compliance with explanation of physiology of deep core and  implications of past surgeries impacting IAP system and how these HEP was helping with her pain. PostTx: pt reported decreased area of pain at L SIJ (no longer spanning length of sacrum and only isolated at L PSIS).  Plan to address DRA at next session. Pt benefits from skilled PT   Personal Factors and Comorbidities Comorbidity 3+    Comorbidities hysterectomy, L kidney cancer  removed 2/2 Kidney cancer 2/ 2019,  A-fib, Fall on to tailbone a year ago. 3 vaginal deliveries with episiotomy. HTN, asthma, Hx of squamous cell carcinoma of skin ,high cholesterol    Examination-Activity Limitations Continence;Stand;Sit;Toileting    Stability/Clinical Decision Making Evolving/Moderate complexity    Rehab Potential Good    PT Frequency 1x / week    PT Duration Other (comment)   10   PT Treatment/Interventions ADLs/Self Care Home Management;Moist Heat;Traction;Therapeutic activities;Therapeutic exercise;Neuromuscular re-education;Manual techniques;Cryotherapy;Biofeedback;Scar mobilization;Dry needling;Energy conservation;Taping;Gait training;Stair training;Functional mobility training;Balance training;Joint Manipulations;Spinal Manipulations    Consulted and Agree with Plan of Care Patient             Patient will benefit from skilled therapeutic intervention in order to improve the following deficits and impairments:  Decreased balance, Difficulty walking, Impaired flexibility, Hypomobility, Decreased strength, Decreased range of motion, Decreased endurance, Decreased activity tolerance, Decreased coordination, Increased muscle spasms, Hypermobility, Decreased scar mobility, Abnormal gait, Impaired sensation, Improper body mechanics, Pain, Postural dysfunction, Decreased mobility, Decreased safety awareness, Increased fascial restricitons  Visit Diagnosis: Other idiopathic scoliosis, thoracolumbar  region  Sacrococcygeal disorders, not elsewhere classified  Other abnormalities of gait and mobility  Diastasis recti     Problem List Patient Active Problem List   Diagnosis Date Noted   Chest pain of uncertain etiology 27/07/8673   Paroxysmal atrial fibrillation ([place]) 05/13/2020   Secondary hypercoagulable state ([place]) 05/13/2020   Atrial fibrillation with RVR ([place]) 02/12/2020   Mixed hyperlipidemia 01/28/2019   Stage 3 chronic kidney disease ([place]) 09/25/2018   Moderate persistent asthma 09/05/2018   Eosinophilia 09/05/2018   Essential hypertension 07/27/2018   Perennial allergic rhinitis with seasonal variation 07/27/2018   Pure hypercholesterolemia 07/27/2018   Chemotherapy induced nausea and vomiting 07/01/2017   Chemotherapy induced diarrhea 07/01/2017   Acute renal insufficiency 07/01/2017   Thyroid dysfunction 07/01/2017   Renal cell carcinoma of left kidney ([place]) 03/21/2017   Cancer of left kidney ([place]) 02/24/2017   Esophagitis, reflux 10/21/2013    Jerl Mina, PT 12/31/2020, 6:18 PM  [facility], [place], [address] Phone: [phone]   Fax:  [phone]  Name: NATHALIA WISMER MRN: [id number] Date of Birth: 1955-03-04

## 2020-12-31 NOTE — Patient Instructions (Signed)
°  Clam Shell 45 Degrees  Lying with hips and knees bent 45, one pillow between knees and ankles. Heel together, toes apart like ballerina,  Lift knee with exhale while pressing heels together. Be sure pelvis does not roll backward. Do not arch back. Do 20 times, each leg, 2 times per day.     Complimentary stretch: Figure-4   3 breaths   Sit at 45 deg turn with R leg and knee on edge of chair/ bench Since it is tighter   ___  DEEP CORE LEVEL 1-2 ( 2 x day) handout)

## 2021-01-02 ENCOUNTER — Encounter: Payer: Self-pay | Admitting: Dermatology

## 2021-01-05 ENCOUNTER — Ambulatory Visit: Payer: Medicare Other | Attending: Obstetrics and Gynecology | Admitting: Physical Therapy

## 2021-01-05 ENCOUNTER — Other Ambulatory Visit: Payer: Self-pay

## 2021-01-05 DIAGNOSIS — R2689 Other abnormalities of gait and mobility: Secondary | ICD-10-CM | POA: Insufficient documentation

## 2021-01-05 DIAGNOSIS — M6208 Separation of muscle (nontraumatic), other site: Secondary | ICD-10-CM | POA: Insufficient documentation

## 2021-01-05 DIAGNOSIS — M533 Sacrococcygeal disorders, not elsewhere classified: Secondary | ICD-10-CM | POA: Insufficient documentation

## 2021-01-05 DIAGNOSIS — M4125 Other idiopathic scoliosis, thoracolumbar region: Secondary | ICD-10-CM | POA: Insufficient documentation

## 2021-01-05 NOTE — Therapy (Signed)
[place] MAIN [facility] SERVICES [address] [place], [place], [address] Phone: [phone]   Fax:  [phone]  Physical Therapy Treatment  Patient Details  Name: Heidi Torres MRN: [id number] Date of Birth: 1955-03-18 Referring Provider (PT): Wannetta Sender   MD   Encounter Date: 01/05/2021   PT End of Session - 01/05/21 1601     Visit Number 4    Number of Visits 10    PT Start Time 1508    PT Stop Time 1603    PT Time Calculation (min) 55 min    Activity Tolerance Patient tolerated treatment well    Behavior During Therapy [facility] for tasks assessed/performed             Past Medical History:  Diagnosis Date   Asthma    Atrial fibrillation ([place])    Cataract cortical, senile    Chronic kidney disease    Complication of anesthesia    Eosinophilic esophagitis    Esophageal stricture    GERD (gastroesophageal reflux disease)    Headache    occular HA   History of hiatal hernia    Hx of dysplastic nevus    multiple sites   Hx of squamous cell carcinoma of skin 06/22/2015   R infrascapular, SCC in situ   Hyperlipidemia    Hypertension    Irregular heart beat    pac's and pvc's   Pneumonia    11-2016   PONV (postoperative nausea and vomiting)    Renal cell cancer, left ([place])    Status post dilation of esophageal narrowing     Past Surgical History:  Procedure Laterality Date   ABLATION     prior to hysterectomy   CYSTOSCOPY/RETROGRADE/URETEROSCOPY Left 01/20/2017   Procedure: CYSTOSCOPY/RETROGRADE/URETEROSCOPY/ BIOPSY,BLADDER BIOPSY PLACEMENT STENT LEFT URETER;  Surgeon: Festus Aloe, MD;  Location: [facility];  Service: Urology;  Laterality: Left;   DILATION AND CURETTAGE OF UTERUS     ESOPHAGOGASTRODUODENOSCOPY N/A 12/16/2019   Procedure: ESOPHAGOGASTRODUODENOSCOPY (EGD);  Surgeon: Lesly Rubenstein, MD;  Location: [facility] ENDOSCOPY;  Service: Endoscopy;  Laterality: N/A;   ESOPHAGOGASTRODUODENOSCOPY (EGD) WITH PROPOFOL  N/A 01/09/2019   Procedure: ESOPHAGOGASTRODUODENOSCOPY (EGD) WITH PROPOFOL;  Surgeon: Toledo, Benay Pike, MD;  Location: [facility] ENDOSCOPY;  Service: Gastroenterology;  Laterality: N/A;   NASAL ENDOSCOPY WITH EPISTAXIS CONTROL     ROBOTIC ASSITED PARTIAL NEPHRECTOMY Left 02/24/2017   Procedure: XI ROBOTIC ASSITED LEFT  RADICAL NEPHRECTOMY;  Surgeon: Alexis Frock, MD;  Location: [facility];  Service: Urology;  Laterality: Left;   TUBAL LIGATION     VAGINAL HYSTERECTOMY      There were no vitals filed for this visit.   Subjective Assessment - 01/05/21 1511     Subjective Pt feels pain at the PSIS on L radiating to R this morning.    Pertinent History hysterectomy, L kidney cancer  removed 2/2 Kidney cancer 2/ 2019,  A-fib, Fall on to tailbone a year ago. 3 vaginal deliveries with episiotomy. HTN, asthma, Hx of squamous cell carcinoma of skin ,high cholesterol    Patient Stated Goals get back to regular walking and sit and stand without hip/ SIJ pain, to strengthen pelvic area                [facility] PT Assessment - 01/05/21 1647       Other:   Other/ Comments figure 4 , lateral tib plateau to floor ( R64 cm with trunk leanto compensation, L 62 cm)  AROM   Overall AROM Comments R sideflexion, pulling sensation at L PSIS (postTx:no pulling sesnsation at L PSIS with R sideflexion)      Palpation   Spinal mobility R lumbar convex    SI assessment  levelled iliac crest    Palpation comment tightness at intercostals lower anterior/ lateral/ posterior , medial scapula mm attachments, interspinals                           [facility] Adult PT Treatment/Exercise - 01/05/21 1649       Neuro Re-ed    Neuro Re-ed Details  cued for R flank stretches      Modalities   Modalities Moist Heat      Moist Heat Therapy   Number Minutes Moist Heat 5 Minutes    Moist Heat Location Lumbar Spine;Hip   Right, in lower trunk rotation with props to provide passive stretch. lengthening to  R flank     Manual Therapy   Manual therapy comments STM/MWM at problem areas noted in assessment to promote more anterior/ lateral excursion of lower ribs for optimal diaphramgatic excursion which helps with IAP system                          PT Long Term Goals - 01/05/21 1555       PT LONG TERM GOAL #1   Title Pt will decrease wearing thin pads. from 2 x a day to < 1 x day in order to improve hygiene and QOL    Time 8    Period Weeks    Status New    Target Date 02/10/21      PT LONG TERM GOAL #2   Title Pt will report no more straining with bowel movments in order to minimize pelvic floor dysfunctions    Time 4    Period Weeks    Status New    Target Date 01/13/21      PT LONG TERM GOAL #3   Title Pt will report no  L glut/ SIJ pain when getting out of SUV and to lie in bed with comfort in order to improve ADLs    Time 10    Period Weeks    Status New    Target Date 02/24/21      PT LONG TERM GOAL #4   Title increase 0.9 m/s gait to > 1.2 m/s and demo more equal stance phase and reciporcal gait pattern  compared to decreased R stance phase, L hip hike and report no hip/ SIJ pain    Time 4    Period Weeks    Status New    Target Date 01/13/21      PT LONG TERM GOAL #5   Title Demo decreased 3 fingers width above umbilicus to < 1 fingers to optimize IAP system to progress to deep core and pelvic floor training    Time 2    Period Weeks    Target Date 12/30/20      Additional Long Term Goals   Additional Long Term Goals Yes      PT LONG TERM GOAL #6   Title Pt will improve FOTO score increase of > 5 pt in categories of Pelvic pain, Urinary, BOwel  in order to improve function    Baseline Pelvic pain 63pt, PFDI 8, PFDI  Urinary    Time 10    Period Weeks  Status New    Target Date 02/24/21      PT LONG TERM GOAL #7   Title Pt will demo increased R hip mobilty with figure -4 stretch with less distance from tib plateau lateral border to floor (  65 cm to < 64 cm) to perform stretch, don/doff shoes with more mobility    Baseline L 62 cm, R 65 cm tib plateau to floor    Time 4    Period Weeks    Status New    Target Date 02/02/21                   Plan - 01/05/21 1657     Clinical Impression Statement Pt continues to show proper pelvic alignment but lumbar convex curve/ limited lateral/ anterior excursion of lower ribs/ diraphragm remains present which isassociated with L SIJ pain at PSIS during R sideflexion. Manual Tx addressed thses deficits which pt tolerated with modifed pressure and technique when pt reporte tenderness. Following manual Tx, pt demo'd improved anterior/ lateral excursion of lower ribs for optimal diaphramgatic excursion which helps with IAP system. Provided scoliosis-specific stretches to lengthen R flank to address lumbar convex curve. Plan to address pelvic floor further at upcoming sessions. Pt benefit from skilled PT.    Personal Factors and Comorbidities Comorbidity 3+    Comorbidities hysterectomy, L kidney cancer  removed 2/2 Kidney cancer 2/ 2019,  A-fib, Fall on to tailbone a year ago. 3 vaginal deliveries with episiotomy. HTN, asthma, Hx of squamous cell carcinoma of skin ,high cholesterol    Examination-Activity Limitations Continence;Stand;Sit;Toileting    Stability/Clinical Decision Making Evolving/Moderate complexity    Clinical Decision Making Moderate    Rehab Potential Good    PT Frequency 1x / week    PT Duration Other (comment)   10   PT Treatment/Interventions ADLs/Self Care Home Management;Moist Heat;Traction;Therapeutic activities;Therapeutic exercise;Neuromuscular re-education;Manual techniques;Cryotherapy;Biofeedback;Scar mobilization;Dry needling;Energy conservation;Taping;Gait training;Stair training;Functional mobility training;Balance training;Joint Manipulations;Spinal Manipulations    Consulted and Agree with Plan of Care Patient             Patient will benefit from  skilled therapeutic intervention in order to improve the following deficits and impairments:  Decreased balance, Difficulty walking, Impaired flexibility, Hypomobility, Decreased strength, Decreased range of motion, Decreased endurance, Decreased activity tolerance, Decreased coordination, Increased muscle spasms, Hypermobility, Decreased scar mobility, Abnormal gait, Impaired sensation, Improper body mechanics, Pain, Postural dysfunction, Decreased mobility, Decreased safety awareness, Increased fascial restricitons  Visit Diagnosis: Other idiopathic scoliosis, thoracolumbar region  Sacrococcygeal disorders, not elsewhere classified  Other abnormalities of gait and mobility  Diastasis recti     Problem List Patient Active Problem List   Diagnosis Date Noted   Chest pain of uncertain etiology 85/63/1497   Paroxysmal atrial fibrillation ([facility]) 05/13/2020   Secondary hypercoagulable state ([place]) 05/13/2020   Atrial fibrillation with RVR ([place]) 02/12/2020   Mixed hyperlipidemia 01/28/2019   Stage 3 chronic kidney disease ([place]) 09/25/2018   Moderate persistent asthma 09/05/2018   Eosinophilia 09/05/2018   Essential hypertension 07/27/2018   Perennial allergic rhinitis with seasonal variation 07/27/2018   Pure hypercholesterolemia 07/27/2018   Chemotherapy induced nausea and vomiting 07/01/2017   Chemotherapy induced diarrhea 07/01/2017   Acute renal insufficiency 07/01/2017   Thyroid dysfunction 07/01/2017   Renal cell carcinoma of left kidney ([place]) 03/21/2017   Cancer of left kidney ([place]) 02/24/2017   Esophagitis, reflux 10/21/2013    Jerl Mina, PT 01/05/2021, 5:02 PM  [place]  [place], [place], [address] Phone: [phone]   Fax:  [phone]  Name: Heidi Torres MRN: [id number] Date of Birth: [birth year]

## 2021-01-05 NOTE — Patient Instructions (Signed)
Open R flank    Banana pose  - Scoot hip to the R and relax knees to a pillows under it, pillow between knees  R arm over head relaxed on bed  __  Open book   L sidelying only, R arm dragging  on body and rotating torso 3/4 turn , chest, arm, and head, chin tucked onto pillow behind back           15 reps

## 2021-01-12 ENCOUNTER — Ambulatory Visit: Payer: Medicare Other | Admitting: Physical Therapy

## 2021-01-12 ENCOUNTER — Ambulatory Visit (HOSPITAL_COMMUNITY): Payer: Medicare Other

## 2021-01-12 ENCOUNTER — Other Ambulatory Visit: Payer: Self-pay

## 2021-01-12 DIAGNOSIS — M533 Sacrococcygeal disorders, not elsewhere classified: Secondary | ICD-10-CM

## 2021-01-12 DIAGNOSIS — M4125 Other idiopathic scoliosis, thoracolumbar region: Secondary | ICD-10-CM | POA: Diagnosis not present

## 2021-01-12 DIAGNOSIS — M6208 Separation of muscle (nontraumatic), other site: Secondary | ICD-10-CM

## 2021-01-12 DIAGNOSIS — R2689 Other abnormalities of gait and mobility: Secondary | ICD-10-CM

## 2021-01-12 NOTE — Therapy (Signed)
[place] MAIN [facility] SERVICES [address] [place], [place], [address] Phone: [phone]   Fax:  [phone]  Physical Therapy Treatment  Patient Details  Name: Heidi Torres MRN: [id number] Date of Birth: 06-Nov-1955 Referring Provider (PT): Wannetta Sender   MD   Encounter Date: 01/12/2021   PT End of Session - 01/12/21 1509     Visit Number 5    Number of Visits 10    PT Start Time 0932    PT Stop Time 3557    PT Time Calculation (min) 63 min    Activity Tolerance Patient tolerated treatment well    Behavior During Therapy [facility] for tasks assessed/performed             Past Medical History:  Diagnosis Date   Asthma    Atrial fibrillation ([place])    Cataract cortical, senile    Chronic kidney disease    Complication of anesthesia    Eosinophilic esophagitis    Esophageal stricture    GERD (gastroesophageal reflux disease)    Headache    occular HA   History of hiatal hernia    Hx of dysplastic nevus    multiple sites   Hx of squamous cell carcinoma of skin 06/22/2015   R infrascapular, SCC in situ   Hyperlipidemia    Hypertension    Irregular heart beat    pac's and pvc's   Pneumonia    11-2016   PONV (postoperative nausea and vomiting)    Renal cell cancer, left ([place])    Status post dilation of esophageal narrowing     Past Surgical History:  Procedure Laterality Date   ABLATION     prior to hysterectomy   CYSTOSCOPY/RETROGRADE/URETEROSCOPY Left 01/20/2017   Procedure: CYSTOSCOPY/RETROGRADE/URETEROSCOPY/ BIOPSY,BLADDER BIOPSY PLACEMENT STENT LEFT URETER;  Surgeon: Festus Aloe, MD;  Location: [facility];  Service: Urology;  Laterality: Left;   DILATION AND CURETTAGE OF UTERUS     ESOPHAGOGASTRODUODENOSCOPY N/A 12/16/2019   Procedure: ESOPHAGOGASTRODUODENOSCOPY (EGD);  Surgeon: Lesly Rubenstein, MD;  Location: [facility] ENDOSCOPY;  Service: Endoscopy;  Laterality: N/A;   ESOPHAGOGASTRODUODENOSCOPY (EGD) WITH PROPOFOL  N/A 01/09/2019   Procedure: ESOPHAGOGASTRODUODENOSCOPY (EGD) WITH PROPOFOL;  Surgeon: Toledo, Benay Pike, MD;  Location: [facility] ENDOSCOPY;  Service: Gastroenterology;  Laterality: N/A;   NASAL ENDOSCOPY WITH EPISTAXIS CONTROL     ROBOTIC ASSITED PARTIAL NEPHRECTOMY Left 02/24/2017   Procedure: XI ROBOTIC ASSITED LEFT  RADICAL NEPHRECTOMY;  Surgeon: Alexis Frock, MD;  Location: [facility];  Service: Urology;  Laterality: Left;   TUBAL LIGATION     VAGINAL HYSTERECTOMY      There were no vitals filed for this visit.   Subjective Assessment - 01/12/21 1511     Subjective Pt has increased the number ofreps with her HEP. Pt forgot to do the open book exercise. Pt still feels pain at the B glut area and at L SIJ .    Pertinent History hysterectomy, L kidney cancer  removed 2/2 Kidney cancer 2/ 2019,  A-fib, Fall on to tailbone a year ago. 3 vaginal deliveries with episiotomy. HTN, asthma, Hx of squamous cell carcinoma of skin ,high cholesterol    Patient Stated Goals get back to regular walking and sit and stand without hip/ SIJ pain, to strengthen pelvic area                [facility] PT Assessment - 01/12/21 1557       Palpation   Spinal mobility less convex lumbar R ,  tightness along thoracic / intercostals/ praspinals B   SI assessment  levelled iliac crest    Palpation comment L sacrum lacking nutation, hypomobile L SIJ by base, tightness at glut med/minimus L  ( postTx: decreased tightness. Hypomobility)     Ambulation/Gait   Gait Comments minimal arm swings  (postTx: improved with cues)                          [facility] Adult PT Treatment/Exercise - 01/12/21 1604       Neuro Re-ed    Neuro Re-ed Details  cued for deep core and open book      Modalities   Modalities Moist Heat      Moist Heat Therapy   Moist Heat Location Other (comment)   sacrum     Manual Therapy   Manual therapy comments STM/MWM at problem areas noted in assessment to promote more thoracic  extension, mobility at L     SIJ, nutation of sacrum,                          PT Long Term Goals - 01/05/21 1555       PT LONG TERM GOAL #1   Title Pt will decrease wearing thin pads. from 2 x a day to < 1 x day in order to improve hygiene and QOL    Time 8    Period Weeks    Status New    Target Date 02/10/21      PT LONG TERM GOAL #2   Title Pt will report no more straining with bowel movments in order to minimize pelvic floor dysfunctions    Time 4    Period Weeks    Status New    Target Date 01/13/21      PT LONG TERM GOAL #3   Title Pt will report no  L glut/ SIJ pain when getting out of SUV and to lie in bed with comfort in order to improve ADLs    Time 10    Period Weeks    Status New    Target Date 02/24/21      PT LONG TERM GOAL #4   Title increase 0.9 m/s gait to > 1.2 m/s and demo more equal stance phase and reciporcal gait pattern  compared to decreased R stance phase, L hip hike and report no hip/ SIJ pain    Time 4    Period Weeks    Status New    Target Date 01/13/21      PT LONG TERM GOAL #5   Title Demo decreased 3 fingers width above umbilicus to < 1 fingers to optimize IAP system to progress to deep core and pelvic floor training    Time 2    Period Weeks    Target Date 12/30/20      Additional Long Term Goals   Additional Long Term Goals Yes      PT LONG TERM GOAL #6   Title Pt will improve FOTO score increase of > 5 pt in categories of Pelvic pain, Urinary, BOwel  in order to improve function    Baseline Pelvic pain 63pt, PFDI 8, PFDI  Urinary    Time 10    Period Weeks    Status New    Target Date 02/24/21      PT LONG TERM GOAL #7   Title Pt will demo increased R hip  mobilty with figure -4 stretch with less distance from tib plateau lateral border to floor ( 65 cm to < 64 cm) to perform stretch, don/doff shoes with more mobility    Baseline L 62 cm, R 65 cm tib plateau to floor    Time 4    Period Weeks    Status New     Target Date 02/02/21                   Plan - 01/12/21 1509     Clinical Impression Statement Pt showed significantly less spinal deviations and a more levelled pelvic girdle with past Tx.  Pt required further manual Tx to promote more SIJ mobility at L SIJ ( sacral nutation and anterior rotation of ilia. Pt also required manual Tx to improve thoracic kyphosis.   Post Tx, pt reported less pain post Tx. Pt required cues to perform correct technique with deep core HEP.   Pt continues to benefit from skilled PT.    Personal Factors and Comorbidities Comorbidity 3+    Comorbidities hysterectomy, L kidney cancer  removed 2/2 Kidney cancer 2/ 2019,  A-fib, Fall on to tailbone a year ago. 3 vaginal deliveries with episiotomy. HTN, asthma, Hx of squamous cell carcinoma of skin ,high cholesterol    Examination-Activity Limitations Continence;Stand;Sit;Toileting    Stability/Clinical Decision Making Evolving/Moderate complexity    Rehab Potential Good    PT Frequency 1x / week    PT Duration Other (comment)   10   PT Treatment/Interventions ADLs/Self Care Home Management;Moist Heat;Traction;Therapeutic activities;Therapeutic exercise;Neuromuscular re-education;Manual techniques;Cryotherapy;Biofeedback;Scar mobilization;Dry needling;Energy conservation;Taping;Gait training;Stair training;Functional mobility training;Balance training;Joint Manipulations;Spinal Manipulations    Consulted and Agree with Plan of Care Patient             Patient will benefit from skilled therapeutic intervention in order to improve the following deficits and impairments:  Decreased balance, Difficulty walking, Impaired flexibility, Hypomobility, Decreased strength, Decreased range of motion, Decreased endurance, Decreased activity tolerance, Decreased coordination, Increased muscle spasms, Hypermobility, Decreased scar mobility, Abnormal gait, Impaired sensation, Improper body mechanics, Pain, Postural  dysfunction, Decreased mobility, Decreased safety awareness, Increased fascial restricitons  Visit Diagnosis: Sacrococcygeal disorders, not elsewhere classified  Other idiopathic scoliosis, thoracolumbar region  Other abnormalities of gait and mobility  Diastasis recti     Problem List Patient Active Problem List   Diagnosis Date Noted   Chest pain of uncertain etiology 58/52/7782   Paroxysmal atrial fibrillation ([place]) 05/13/2020   Secondary hypercoagulable state ([place]) 05/13/2020   Atrial fibrillation with RVR ([place]) 02/12/2020   Mixed hyperlipidemia 01/28/2019   Stage 3 chronic kidney disease ([place]) 09/25/2018   Moderate persistent asthma 09/05/2018   Eosinophilia 09/05/2018   Essential hypertension 07/27/2018   Perennial allergic rhinitis with seasonal variation 07/27/2018   Pure hypercholesterolemia 07/27/2018   Chemotherapy induced nausea and vomiting 07/01/2017   Chemotherapy induced diarrhea 07/01/2017   Acute renal insufficiency 07/01/2017   Thyroid dysfunction 07/01/2017   Renal cell carcinoma of left kidney ([place]) 03/21/2017   Cancer of left kidney ([place]) 02/24/2017   Esophagitis, reflux 10/21/2013    Jerl Mina, PT 01/12/2021, 5:53 PM  [place] MAIN [facility] SERVICES [address] [place], [place], [address] Phone: [phone]   Fax:  [phone]  Name: Heidi Torres MRN: [id number] Date of Birth: 29-Mar-1955

## 2021-01-12 NOTE — Patient Instructions (Signed)
Focus on feet and knee alignment reset during deep core   Only move one knee after exhale in deep core level 2  Walk with armswings   Car seat modification  Folded towels to fill bucket seat Folded beach towel long ways against back of seat into head rest to fill in the space so your head and back and sacrum aligns  Hands at 9 and 3 o clock  Feet and heel on the mat  Back of the hips against the seat evenly

## 2021-01-19 ENCOUNTER — Other Ambulatory Visit: Payer: Self-pay

## 2021-01-19 ENCOUNTER — Ambulatory Visit: Payer: Medicare Other | Admitting: Physical Therapy

## 2021-01-19 DIAGNOSIS — R2689 Other abnormalities of gait and mobility: Secondary | ICD-10-CM

## 2021-01-19 DIAGNOSIS — M4125 Other idiopathic scoliosis, thoracolumbar region: Secondary | ICD-10-CM

## 2021-01-19 DIAGNOSIS — M533 Sacrococcygeal disorders, not elsewhere classified: Secondary | ICD-10-CM

## 2021-01-19 DIAGNOSIS — M6208 Separation of muscle (nontraumatic), other site: Secondary | ICD-10-CM

## 2021-01-19 NOTE — Patient Instructions (Addendum)
Swimmers standing see video 90 sec x 5 reps   Visual input of width of tile lines to put foot back hip width apart  Front knee above the ankle,  Back ballmounds push off , stepping back hip back hip width apart   Shoulders down "half V" , no need to clap hands, just drop hand down by side  __  Minisquat: Scoot buttocks back slight, hinge like you are looking at your reflection on a pond  Knees behind toes,  Inhale to "smell flowers"  Exhale on the rise "like rocket"  Do not lock knees, have more weight across ballmounds of feet, toes relaxed   THEN HALF step on both feet first lap with left foot leading down a hall way = 1 lap  Repeated with other foot leading =1 lap  2 laps each side    ___ Complimentary stretches to above exericses:  Figure 4 stretch   Hamstring stretch./ calf stretch with R knee straight, toe up with rib cage rotated,  L hand on shoulder to prevent hiked upper trap muscles, look up at the sky  Seated twist

## 2021-01-19 NOTE — Therapy (Signed)
[place] MAIN [facility] SERVICES [address] [place], [place], [address] Phone: [phone]   Fax:  [phone]  Physical Therapy Treatment  Patient Details  Name: Heidi Torres MRN: [id number] Date of Birth: 05-Oct-1955 Referring Provider (PT): Wannetta Sender   MD   Encounter Date: 01/19/2021   PT End of Session - 01/19/21 1509     Visit Number 6    Number of Visits 10    PT Start Time 6962    PT Stop Time 9528    PT Time Calculation (min) 58 min    Activity Tolerance Patient tolerated treatment well    Behavior During Therapy [facility] for tasks assessed/performed             Past Medical History:  Diagnosis Date   Asthma    Atrial fibrillation ([place])    Cataract cortical, senile    Chronic kidney disease    Complication of anesthesia    Eosinophilic esophagitis    Esophageal stricture    GERD (gastroesophageal reflux disease)    Headache    occular HA   History of hiatal hernia    Hx of dysplastic nevus    multiple sites   Hx of squamous cell carcinoma of skin 06/22/2015   R infrascapular, SCC in situ   Hyperlipidemia    Hypertension    Irregular heart beat    pac's and pvc's   Pneumonia    11-2016   PONV (postoperative nausea and vomiting)    Renal cell cancer, left ([place])    Status post dilation of esophageal narrowing     Past Surgical History:  Procedure Laterality Date   ABLATION     prior to hysterectomy   CYSTOSCOPY/RETROGRADE/URETEROSCOPY Left 01/20/2017   Procedure: CYSTOSCOPY/RETROGRADE/URETEROSCOPY/ BIOPSY,BLADDER BIOPSY PLACEMENT STENT LEFT URETER;  Surgeon: Festus Aloe, MD;  Location: [facility];  Service: Urology;  Laterality: Left;   DILATION AND CURETTAGE OF UTERUS     ESOPHAGOGASTRODUODENOSCOPY N/A 12/16/2019   Procedure: ESOPHAGOGASTRODUODENOSCOPY (EGD);  Surgeon: Lesly Rubenstein, MD;  Location: [facility]COPY;  Service: Endoscopy;  Laterality: N/A;   ESOPHAGOGASTRODUODENOSCOPY (EGD) WITH PROPOFOL  N/A 01/09/2019   Procedure: ESOPHAGOGASTRODUODENOSCOPY (EGD) WITH PROPOFOL;  Surgeon: Toledo, Benay Pike, MD;  Location: [facility] ENDOSCOPY;  Service: Gastroenterology;  Laterality: N/A;   NASAL ENDOSCOPY WITH EPISTAXIS CONTROL     ROBOTIC ASSITED PARTIAL NEPHRECTOMY Left 02/24/2017   Procedure: XI ROBOTIC ASSITED LEFT  RADICAL NEPHRECTOMY;  Surgeon: Alexis Frock, MD;  [facility]: [facility];  Service: Urology;  Laterality: Left;   TUBAL LIGATION     VAGINAL HYSTERECTOMY      There were no vitals filed for this visit.   Subjective Assessment - 01/19/21 1509     Subjective Pt reports the pain is no longer at the greater trochanter and it has moved up. Pt feels she will need to get steriod shots to get to her mom in [facility] by flight on 02/04/21 and celebrate her 89th birthday. Pt feels it is getting better but not at the level she wants. Pt walked for the first time yesterday in a 1.5 year since her pain started. Pt was able to walk down the street with her husband with manageable pain 2/10.    Pertinent History hysterectomy, L kidney cancer  removed 2/2 Kidney cancer 2/ 2019,  A-fib, Fall on to tailbone a year ago. 3 vaginal deliveries with episiotomy. HTN, asthma, Hx of squamous cell carcinoma of skin ,high cholesterol    Patient Stated Goals  get back to regular walking and sit and stand without hip/ SIJ pain, to strengthen pelvic area                               [facility] Adult PT Treatment/Exercise - 01/19/21 1655       Neuro Re-ed    Neuro Re-ed Details  excessive cues fto correct narrow BOS, propioception of feet and knees in backward stepping, cued for mini squat technique      Exercises   Other Exercises  see pt instructions      Manual Therapy   Manual therapy comments STM/MWM at problem areas noted in assessment to promote more anterior/ lateral excursion of lower ribs for optimal diaphragm function, and to decrease interspinal mm on R                           PT Long Term Goals - 01/05/21 1555       PT LONG TERM GOAL #1   Title Pt will decrease wearing thin pads. from 2 x a day to < 1 x day in order to improve hygiene and QOL    Time 8    Period Weeks    Status New    Target Date 02/10/21      PT LONG TERM GOAL #2   Title Pt will report no more straining with bowel movments in order to minimize pelvic floor dysfunctions    Time 4    Period Weeks    Status New    Target Date 01/13/21      PT LONG TERM GOAL #3   Title Pt will report no  L glut/ SIJ pain when getting out of SUV and to lie in bed with comfort in order to improve ADLs    Time 10    Period Weeks    Status New    Target Date 02/24/21      PT LONG TERM GOAL #4   Title increase 0.9 m/s gait to > 1.2 m/s and demo more equal stance phase and reciporcal gait pattern  compared to decreased R stance phase, L hip hike and report no hip/ SIJ pain    Time 4    Period Weeks    Status New    Target Date 01/13/21      PT LONG TERM GOAL #5   Title Demo decreased 3 fingers width above umbilicus to < 1 fingers to optimize IAP system to progress to deep core and pelvic floor training    Time 2    Period Weeks    Target Date 12/30/20      Additional Long Term Goals   Additional Long Term Goals Yes      PT LONG TERM GOAL #6   Title Pt will improve FOTO score increase of > 5 pt in categories of Pelvic pain, Urinary, BOwel  in order to improve function    Baseline Pelvic pain 63pt, PFDI 8, PFDI  Urinary    Time 10    Period Weeks    Status New    Target Date 02/24/21      PT LONG TERM GOAL #7   Title Pt will demo increased R hip mobilty with figure -4 stretch with less distance from tib plateau lateral border to floor ( 65 cm to < 64 cm) to perform stretch, don/doff shoes with more mobility  Baseline L 62 cm, R 65 cm tib plateau to floor    Time 4    Period Weeks    Status New    Target Date 02/02/21                   Plan - 01/19/21  1509     Clinical Impression Statement Pt is improving with less c/o of pain at greater trochanter and is able to walk with her husband along her driveway.   Advanced to functional strengthening to promote glut strength and further walking endurance. Provided excessive cues to  correct narrow BOS, improve propioception of feet and knees in backward stepping, cued for mini squat technique with side stepping to strengthen hip abduction in CKC.  Pt continued to require manual Tx to minimize thoracic kyphosis and optimize anterolateral rib excursion for IAP system.   Plan to add oblique strengthening and thoracoscapular next session  Pt continues to  benefit form skilled PT   Personal Factors and Comorbidities Comorbidity 3+    Comorbidities hysterectomy, L kidney cancer  removed 2/2 Kidney cancer 2/ 2019,  A-fib, Fall on to tailbone a year ago. 3 vaginal deliveries with episiotomy. HTN, asthma, Hx of squamous cell carcinoma of skin ,high cholesterol    Examination-Activity Limitations Continence;Stand;Sit;Toileting    Stability/Clinical Decision Making Evolving/Moderate complexity    Rehab Potential Good    PT Frequency 1x / week    PT Duration Other (comment)   10   PT Treatment/Interventions ADLs/Self Care Home Management;Moist Heat;Traction;Therapeutic activities;Therapeutic exercise;Neuromuscular re-education;Manual techniques;Cryotherapy;Biofeedback;Scar mobilization;Dry needling;Energy conservation;Taping;Gait training;Stair training;Functional mobility training;Balance training;Joint Manipulations;Spinal Manipulations    Consulted and Agree with Plan of Care Patient             Patient will benefit from skilled therapeutic intervention in order to improve the following deficits and impairments:  Decreased balance, Difficulty walking, Impaired flexibility, Hypomobility, Decreased strength, Decreased range of motion, Decreased endurance, Decreased activity tolerance, Decreased  coordination, Increased muscle spasms, Hypermobility, Decreased scar mobility, Abnormal gait, Impaired sensation, Improper body mechanics, Pain, Postural dysfunction, Decreased mobility, Decreased safety awareness, Increased fascial restricitons  Visit Diagnosis: Other idiopathic scoliosis, thoracolumbar region  Sacrococcygeal disorders, not elsewhere classified  Other abnormalities of gait and mobility  Diastasis recti     Problem List Patient Active Problem List   Diagnosis Date Noted   Chest pain of uncertain etiology 55/73/2202   Paroxysmal atrial fibrillation ([place]) 05/13/2020   Secondary hypercoagulable state ([place]) 05/13/2020   Atrial fibrillation with RVR ([place]) 02/12/2020   Mixed hyperlipidemia 01/28/2019   Stage 3 chronic kidney disease ([place]) 09/25/2018   Moderate persistent asthma 09/05/2018   Eosinophilia 09/05/2018   Essential hypertension 07/27/2018   Perennial allergic rhinitis with seasonal variation 07/27/2018   Pure hypercholesterolemia 07/27/2018   Chemotherapy induced nausea and vomiting 07/01/2017   Chemotherapy induced diarrhea 07/01/2017   Acute renal insufficiency 07/01/2017   Thyroid dysfunction 07/01/2017   Renal cell carcinoma of left kidney ([place]) 03/21/2017   Cancer of left kidney ([place]) 02/24/2017   Esophagitis, reflux 10/21/2013    Jerl Mina, PT 01/19/2021, 4:59 PM  [place] MAIN [facility] SERVICES [address] [place], [place], [address] Phone: [phone]   Fax:  [phone]  Name: Heidi Torres MRN: [id number] Date of Birth: 10/10/1955

## 2021-01-26 ENCOUNTER — Ambulatory Visit: Payer: Medicare Other | Admitting: Physical Therapy

## 2021-01-28 NOTE — Progress Notes (Signed)
Electrophysiology Office Follow up Visit Note:    Date:  01/29/2021   ID:  Heidi Torres, DOB January 07, 1955, MRN [id number]  PCP:  [name], MD  [facility] Cardiologist:  None  [facility] Electrophysiologist:  Vickie Epley, MD    Interval History:    Heidi Torres is a 66 y.o. adult who presents for a follow up visit. They were last seen in clinic December 02, 2020 by Dr. Rayann Heman.  He is followed the patient for atrial fibrillation.  The patient's EKG is also been noted to have an abnormally short PR interval suggestive of an accessory pathway.  Dr. Rayann Heman referred to me at the last appointment to discuss ablation and watchman implant.  Today, she confirms the above.  She is very clear that her episodes of atrial fibrillation were in September 2021, February 2022 in March 2022.  She has not had an episode in almost a year.  She continues to have very frequent GI symptoms.  She describes belching and right-sided pressure that she associates with her GI system.  She is established with gastroenterology.  She thinks all of her atrial fibrillation is related to her GI issues although she clearly has episodes of GI complaints that do not correspond to atrial arrhythmias.  She tolerates her Xarelto without bleeding issues.     Past Medical History:  Diagnosis Date   Asthma    Atrial fibrillation ([place])    Cataract cortical, senile    Chronic kidney disease    Complication of anesthesia    Eosinophilic esophagitis    Esophageal stricture    GERD (gastroesophageal reflux disease)    Headache    occular HA   History of hiatal hernia    Hx of dysplastic nevus    multiple sites   Hx of squamous cell carcinoma of skin 06/22/2015   R infrascapular, SCC in situ   Hyperlipidemia    Hypertension    Irregular heart beat    pac's and pvc's   Pneumonia    11-2016   PONV (postoperative nausea and vomiting)    Renal cell cancer, left (HCC)    Status post dilation of  esophageal narrowing     Past Surgical History:  Procedure Laterality Date   ABLATION     prior to hysterectomy   CYSTOSCOPY/RETROGRADE/URETEROSCOPY Left 01/20/2017   Procedure: CYSTOSCOPY/RETROGRADE/URETEROSCOPY/ BIOPSY,BLADDER BIOPSY PLACEMENT STENT LEFT URETER;  Surgeon: Festus Aloe, MD;  Location: [facility];  Service: Urology;  Laterality: Left;   DILATION AND CURETTAGE OF UTERUS     ESOPHAGOGASTRODUODENOSCOPY N/A 12/16/2019   Procedure: ESOPHAGOGASTRODUODENOSCOPY (EGD);  Surgeon: Lesly Rubenstein, MD;  Location: [facility] END[facility]COPY;  Service: Endoscopy;  Laterality: N/A;   ESOPHAGOGASTRODUODENOSCOPY (EGD) WITH PROPOFOL N/A 01/09/2019   Procedure: ESOPHAGOGASTRODUODENOSCOPY (EGD) WITH PROPOFOL;  Surgeon: Toledo, Benay Pike, MD;  Location: [facility] ENDOSCOPY;  Service: Gastroenterology;  Laterality: N/A;   NASAL ENDOSCOPY WITH EPISTAXIS CONTROL     ROBOTIC ASSITED PARTIAL NEPHRECTOMY Left 02/24/2017   Procedure: XI ROBOTIC ASSITED LEFT  RADICAL NEPHRECTOMY;  Surgeon: Alexis Frock, MD;  Location: [facility];  Service: Urology;  Laterality: Left;   TUBAL LIGATION     VAGINAL HYSTERECTOMY      Current Medications: Current Meds  Medication Sig   albuterol (2.5 MG/3ML) 0.083% NEBU 3 mL, albuterol (5 MG/ML) 0.5% NEBU 0.5 mL Inhale into the lungs.   albuterol (VENTOLIN HFA) 108 (90 Base) MCG/ACT inhaler Inhale 2 puffs into the lungs every 4 (four) hours as needed.  ALPRAZolam (XANAX) 0.25 MG tablet Take 0.25 mg by mouth at bedtime as needed for anxiety. Anxiety.   atorvastatin (LIPITOR) 40 MG tablet Take 40 mg by mouth daily.   Cholecalciferol (VITAMIN D3) 1000 units CAPS Take 1 capsule by mouth daily.   doxycycline (VIBRAMYCIN) 50 MG capsule Take 1 capsule (50 mg total) by mouth every evening. Take with food   EPINEPHrine 0.3 mg/0.3 mL IJ SOAJ injection See admin instructions.   magnesium 30 MG tablet Take 30 mg by mouth 2 (two) times daily.   olmesartan (BENICAR) 20 MG tablet Take 20 mg by  mouth daily.   potassium chloride (MICRO-K) 10 MEQ CR capsule Take 20 mEq by mouth daily.   rivaroxaban (XARELTO) 20 MG TABS tablet Take 1 tablet by mouth daily with breakfast.   triamterene-hydrochlorothiazide (DYAZIDE) 37.5-25 MG per capsule Take 1 capsule by mouth daily.     Allergies:   Benzoin, Ciprofloxacin, and Steri-strip compound benzoin [benzoin compound]   Social History   Socioeconomic History   Marital status: Married    Spouse name: Not on file   Number of children: 3   Years of education: Not on file   Highest education level: Not on file  Occupational History   Occupation: Teacher  Tobacco Use   Smoking status: Never   Smokeless tobacco: Never  Vaping Use   Vaping Use: Never used  Substance and Sexual Activity   Alcohol use: No   Drug use: Never   Sexual activity: Yes  Other Topics Concern   Not on file  Social History Narrative   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  No alcohol no caffeine no drug use or other tobacco.   Social Determinants of Health   Financial Resource Strain: Not on file  Food Insecurity: Not on file  Transportation Needs: Not on file  Physical Activity: Not on file  Stress: Not on file  Social Connections: Not on file     Family History: The patient's family history includes AAA (abdominal aortic aneurysm) in her maternal grandmother; Heart disease in her father and mother; Hyperlipidemia in her mother; Hypertension in her brother, father, and sister; Lung cancer in her maternal grandfather; Squamous cell carcinoma in her father. There is no history of Colon cancer, Esophageal cancer, or Stomach cancer.  ROS:   Please see the history of present illness.    All other systems reviewed and are negative.  EKGs/Labs/Other Studies Reviewed:    The following studies were reviewed today:  Prior records  EKG:  The ekg ordered today demonstrates sinus rhythm.  Borderline short PR interval.  No  obvious preexcitation.  Recent Labs: 02/12/2020: ALT 29; TSH 2.965 05/21/2020: Hemoglobin 10.7; Magnesium 2.0; Platelets 267 09/24/2020: BUN 33; Creatinine, Ser 1.27; Potassium 4.1; Sodium 139  Recent Lipid Panel No results found for: CHOL, TRIG, HDL, CHOLHDL, VLDL, LDLCALC, LDLDIRECT  Physical Exam:    VS:  BP 120/64    Pulse 64    Ht 5' 5.5" (1.664 m)    Wt 162 lb 3.2 oz (73.6 kg)    LMP  (LMP Unknown)    SpO2 96%    BMI 26.58 kg/m     Wt Readings from Last 3 Encounters:  01/29/21 162 lb 3.2 oz (73.6 kg)  12/02/20 158 lb 12.8 oz (72 kg)  11/17/20 159 lb (72.1 kg)     GEN:  Well nourished, well developed in no acute distress HEENT: Normal NECK: No JVD; No carotid  bruits LYMPHATICS: No lymphadenopathy CARDIAC: RRR, no murmurs, rubs, gallops RESPIRATORY:  Clear to auscultation without rales, wheezing or rhonchi  ABDOMEN: Soft, non-tender, non-distended MUSCULOSKELETAL:  No edema; No deformity  SKIN: Warm and dry NEUROLOGIC:  Alert and oriented x 3 PSYCHIATRIC:  Normal affect        ASSESSMENT:    1. Paroxysmal atrial fibrillation (HCC)    PLAN:    In order of problems listed above:  #Paroxysmal atrial fibrillation Very low burden.  Last episode of atrial fibrillation was in March 2022.  She has had 3 total episodes since September 2021.  She is on Xarelto for stroke prophylaxis.  Her episodes of atrial fibrillation may be related to her GI complaints but her history suggest that she is having a significant amount of GI complaints with only very rare episodes of atrial fibrillation.    While she is a candidate for the watchman device, I do not think I would explore this further given she is tolerating the Xarelto.  This was discussed at length with the patient during today's visit.  For now, continue Xarelto.  To be sure we are not missing atrial fibrillation episodes that are going unnoticed by the patient, we will have her wear a 2-week ZIO monitor and touch base with one  of our APP's in 6 to 8 weeks.  Okay for virtual appointment.  #GI upset/complaints Follows with gastroenterology.  It is possible that her atrial fibrillation episodes in the past were triggered by GI complaints.  I do not think addressing her rare atrial fibrillation episodes would have any impact on her GI complaints.   Follow-up 6 to 8 weeks with APP to review ZIO report.  If no atrial fibrillation on monitoring, follow-up with EP as needed.   Total time spent with patient today 50 minutes. This includes reviewing records, evaluating the patient and coordinating care.   Medication Adjustments/Labs and Tests Ordered: Current medicines are reviewed at length with the patient today.  Concerns regarding medicines are outlined above.  Orders Placed This Encounter  Procedures   LONG TERM MONITOR (3-14 DAYS)   EKG 12-Lead   No orders of the defined types were placed in this encounter.    Signed, Lars Mage, MD, [facility], [facility] 01/29/2021 1:35 PM    Electrophysiology [facility]

## 2021-01-29 ENCOUNTER — Ambulatory Visit (INDEPENDENT_AMBULATORY_CARE_PROVIDER_SITE_OTHER): Payer: Medicare Other | Admitting: Cardiology

## 2021-01-29 ENCOUNTER — Ambulatory Visit (INDEPENDENT_AMBULATORY_CARE_PROVIDER_SITE_OTHER): Payer: Medicare Other

## 2021-01-29 ENCOUNTER — Other Ambulatory Visit: Payer: Self-pay

## 2021-01-29 ENCOUNTER — Encounter: Payer: Self-pay | Admitting: Cardiology

## 2021-01-29 VITALS — BP 120/64 | HR 64 | Ht 65.5 in | Wt 162.2 lb

## 2021-01-29 DIAGNOSIS — I480 Paroxysmal atrial fibrillation: Secondary | ICD-10-CM

## 2021-01-29 NOTE — Progress Notes (Unsigned)
Enrolled patient for a 14 day Zio XT  monitor to be mailed to patients home  °

## 2021-01-29 NOTE — Patient Instructions (Addendum)
Medication Instructions:  Your physician recommends that you continue on your current medications as directed. Please refer to the Current Medication list given to you today. *If you need a refill on your cardiac medications before your next appointment, please call your pharmacy*  Lab Work: None. If you have labs (blood work) drawn today and your tests are completely normal, you will receive your results only by: [place] (if you have MyChart) OR A paper copy in the mail If you have any lab test that is abnormal or we need to change your treatment, we will call you to review the results.  Testing/Procedures: Your physician has recommended that you wear a heart monitor. Heart monitors are medical devices that record the hearts electrical activity. Doctors most often use these monitors to diagnose arrhythmias. Arrhythmias are problems with the speed or rhythm of the heartbeat. The monitor is a small, portable device. You can wear one while you do your normal daily activities. This is usually used to diagnose what is causing palpitations/syncope (passing out).   Follow-Up: At [facility], you and your health needs are our priority.  As part of our continuing mission to provide you with exceptional heart care, we have created designated Provider Care Teams.  These Care Teams include your primary Cardiologist (physician) and Advanced Practice Providers (APPs -  Physician Assistants and Nurse Practitioners) who all work together to provide you with the care you need, when you need it.  Your physician wants you to follow-up in: 6-8 weeks (okay virtual) with one of the following Advanced Practice Providers on your designated Care Team:    Tommye Standard, [place] Legrand Como "Jonni Sanger" Chalmers Cater, [place]   We recommend signing up for the patient portal called "MyChart".  Sign up information is provided on this After Visit Summary.  MyChart is used to connect with patients for Virtual Visits (Telemedicine).   Patients are able to view lab/test results, encounter notes, upcoming appointments, etc.  Non-urgent messages can be sent to your provider as well.   To learn more about what you can do with MyChart, go to [email or web address].    Any Other Special Instructions Will Be Listed Below (If Applicable).  ZIO XT- Long Term Monitor Instructions  Your physician has requested you wear a ZIO patch monitor for 14 days.  This is a single patch monitor. [employer] supplies one patch monitor per enrollment. Additional stickers are not available. Please do not apply patch if you will be having a Nuclear Stress Test,  Echocardiogram, Cardiac CT, MRI, or Chest Xray during the period you would be wearing the  monitor. The patch cannot be worn during these tests. You cannot remove and re-apply the  ZIO XT patch monitor.  Your ZIO patch monitor will be mailed 3 day USPS to your address on file. It may take 3-5 days  to receive your monitor after you have been enrolled.  Once you have received your monitor, please review the enclosed instructions. Your monitor  has already been registered assigning a specific monitor serial # to you.  Billing and Patient Assistance Program Information  We have supplied [employer] with any of your insurance information on file for billing purposes. [employer] offers a sliding scale Patient Assistance Program for patients that do not have  insurance, or whose insurance does not completely cover the cost of the ZIO monitor.  You must apply for the Patient Assistance Program to qualify for this discounted rate.  To apply, please call [employer] at [phone], select option 4,  select option 2, ask to apply for  Patient Assistance Program. Theodore Demark will ask your household income, and how many people  are in your household. They will quote your out-of-pocket cost based on that information.  [employer] will also be able to set up a 59-month, interest-free payment plan if needed.  Applying the  monitor   Shave hair from upper left chest.  Hold abrader disc by orange tab. Rub abrader in 40 strokes over the upper left chest as  indicated in your monitor instructions.  Clean area with 4 enclosed alcohol pads. Let dry.  Apply patch as indicated in monitor instructions. Patch will be placed under collarbone on left  side of chest with arrow pointing upward.  Rub patch adhesive wings for 2 minutes. Remove white label marked "1". Remove the white  label marked "2". Rub patch adhesive wings for 2 additional minutes.  While looking in a mirror, press and release button in center of patch. A small green light will  flash 3-4 times. This will be your only indicator that the monitor has been turned on.  Do not shower for the first 24 hours. You may shower after the first 24 hours.  Press the button if you feel a symptom. You will hear a small click. Record Date, Time and  Symptom in the Patient Logbook.  When you are ready to remove the patch, follow instructions on the last 2 pages of Patient  Logbook. Stick patch monitor onto the last page of Patient Logbook.  Place Patient Logbook in the blue and white box. Use locking tab on box and tape box closed  securely. The blue and white box has prepaid postage on it. Please place it in the mailbox as  soon as possible. Your physician should have your test results approximately 7 days after the  monitor has been mailed back to [facility].  Call [place] at [phone] if you have questions regarding  your ZIO XT patch monitor. Call them immediately if you see an orange light blinking on your  monitor.  If your monitor falls off in less than 4 days, contact our Monitor department at [phone].  If your monitor becomes loose or falls off after 4 days call [employer] at [phone] for  suggestions on securing your monitor

## 2021-02-02 ENCOUNTER — Ambulatory Visit: Payer: Medicare Other | Admitting: Physical Therapy

## 2021-02-02 ENCOUNTER — Other Ambulatory Visit: Payer: Self-pay

## 2021-02-02 DIAGNOSIS — R2689 Other abnormalities of gait and mobility: Secondary | ICD-10-CM

## 2021-02-02 DIAGNOSIS — M533 Sacrococcygeal disorders, not elsewhere classified: Secondary | ICD-10-CM

## 2021-02-02 DIAGNOSIS — M6208 Separation of muscle (nontraumatic), other site: Secondary | ICD-10-CM

## 2021-02-02 DIAGNOSIS — M4125 Other idiopathic scoliosis, thoracolumbar region: Secondary | ICD-10-CM | POA: Diagnosis not present

## 2021-02-02 NOTE — Therapy (Signed)
[place] MAIN [facility] SERVICES [address] [place], [place], [address] Phone: [phone]   Fax:  [phone]  Physical Therapy Treatment  Patient Details  Name: Heidi Torres MRN: [id number] Date of Birth: 21-Nov-1955 Referring Provider (PT): Wannetta Sender   MD   Encounter Date: 02/02/2021   PT End of Session - 02/02/21 1056     Visit Number 7    Number of Visits 10    PT Start Time 0910    PT Stop Time 1020    PT Time Calculation (min) 70 min    Activity Tolerance Patient tolerated treatment well    Behavior During Therapy [facility] for tasks assessed/performed             Past Medical History:  Diagnosis Date   Asthma    Atrial fibrillation ([place])    Cataract cortical, senile    Chronic kidney disease    Complication of anesthesia    Eosinophilic esophagitis    Esophageal stricture    GERD (gastroesophageal reflux disease)    Headache    occular HA   History of hiatal hernia    Hx of dysplastic nevus    multiple sites   Hx of squamous cell carcinoma of skin 06/22/2015   R infrascapular, SCC in situ   Hyperlipidemia    Hypertension    Irregular heart beat    pac's and pvc's   Pneumonia    11-2016   PONV (postoperative nausea and vomiting)    Renal cell cancer, left ([place])    Status post dilation of esophageal narrowing     Past Surgical History:  Procedure Laterality Date   ABLATION     prior to hysterectomy   CYSTOSCOPY/RETROGRADE/URETEROSCOPY Left 01/20/2017   Procedure: CYSTOSCOPY/RETROGRADE/URETEROSCOPY/ BIOPSY,BLADDER BIOPSY PLACEMENT STENT LEFT URETER;  Surgeon: Festus Aloe, MD;  Location: [facility];  Service: Urology;  Laterality: Left;   DILATION AND CURETTAGE OF UTERUS     ESOPHAGOGASTRODUODENOSCOPY N/A 12/16/2019   Procedure: ESOPHAGOGASTRODUODENOSCOPY (EGD);  Surgeon: Lesly Rubenstein, MD;  Location: [facility] ENDOSCOPY;  Service: Endoscopy;  Laterality: N/A;   ESOPHAGOGASTRODUODENOSCOPY (EGD) WITH PROPOFOL  N/A 01/09/2019   Procedure: ESOPHAGOGASTRODUODENOSCOPY (EGD) WITH PROPOFOL;  Surgeon: Toledo, Benay Pike, MD;  Location: [facility] ENDOSCOPY;  Service: Gastroenterology;  Laterality: N/A;   NASAL ENDOSCOPY WITH EPISTAXIS CONTROL     ROBOTIC ASSITED PARTIAL NEPHRECTOMY Left 02/24/2017   Procedure: XI ROBOTIC ASSITED LEFT  RADICAL NEPHRECTOMY;  Surgeon: Alexis Frock, MD;  Location: [facility];  Service: Urology;  Laterality: Left;   TUBAL LIGATION     VAGINAL HYSTERECTOMY      There were no vitals filed for this visit.   Subjective Assessment - 02/02/21 0912     Subjective Pt went to her orthopedist and she got a injection on both hips. The R hip is better with the shot. L hip is 1/10 level with the shot. Pt is no longer reaching for furniture for support to get moving. This last month, pt noticed moving helps decrease the pain where as prior to PT, it hurt to move all the time.    Pertinent History hysterectomy, L kidney cancer  removed 2/2 Kidney cancer 2/ 2019,  A-fib, Fall on to tailbone a year ago. 3 vaginal deliveries with episiotomy. HTN, asthma, Hx of squamous cell carcinoma of skin ,high cholesterol    Patient Stated Goals get back to regular walking and sit and stand without hip/ SIJ pain, to strengthen pelvic area  [facility] PT Assessment - 02/02/21 0936       Strength   Overall Strength Comments L ankle DF/EV 3/5, R 4+/5      Palpation   Spinal mobility no more convex lumbar curve to R    SI assessment  levelled iliac crest    Palpation comment tightness and tenderness at L SIj base and lower hip abduction mm, adductors, hamstrings, L coccygeus      6 minute walk test results    Aerobic Endurance Distance Walked 1290    Endurance additional comments no SOB, long strides                           [facility] Adult PT Treatment/Exercise - 02/02/21 0955       Therapeutic Activites    Other Therapeutic Activities explained the the importance of new HEP  for posterior chain and showed anatomy/ physiology, assessed 6 MWT, provided biopyschoscial approaches, provided stretches after walking      Neuro Re-ed    Neuro Re-ed Details  cued for stretches after walking, technique for DF/EV on L with green band      Manual Therapy   Manual therapy comments STM/MWM at problem areas noted in assessment to promote more ER of femur/tibia on L LE and  DF/EF                          PT Long Term Goals - 02/02/21 1104       PT LONG TERM GOAL #1   Title Pt will decrease wearing thin pads. from 2 x a day to < 1 x day in order to improve hygiene and QOL    Time 8    Period Weeks    Status On-going    Target Date 02/10/21      PT LONG TERM GOAL #2   Title Pt will report no more straining with bowel movments in order to minimize pelvic floor dysfunctions    Time 4    Period Weeks    Status On-going    Target Date 01/13/21      PT LONG TERM GOAL #3   Title Pt will report no  L glut/ SIJ pain when getting out of SUV and to lie in bed with comfort in order to improve ADLs    Time 10    Period Weeks    Status On-going    Target Date 02/24/21      PT LONG TERM GOAL #4   Title increase 0.9 m/s gait to > 1.2 m/s and demo more equal stance phase and reciporcal gait pattern  compared to decreased R stance phase, L hip hike and report no hip/ SIJ pain    Time 4    Period Weeks    Status On-going    Target Date 01/13/21      PT LONG TERM GOAL #5   Title Demo decreased 3 fingers width above umbilicus to < 1 fingers to optimize IAP system to progress to deep core and pelvic floor training    Time 2    Period Weeks    Status On-going    Target Date 12/30/20      PT LONG TERM GOAL #6   Title Pt will improve FOTO score increase of > 5 pt in categories of Pelvic pain, Urinary, BOwel  in order to improve function    Baseline Pelvic pain 63pt, PFDI 8,  PFDI  Urinary    Time 10    Period Weeks    Status On-going    Target Date 02/24/21       PT LONG TERM GOAL #7   Title Pt will demo increased R hip mobilty with figure -4 stretch with less distance from tib plateau lateral border to floor ( 65 cm to < 64 cm) to perform stretch, don/doff shoes with more mobility    Baseline L 62 cm, R 65 cm tib plateau to floor    Time 4    Period Weeks    Status On-going    Target Date 02/02/21                   Plan - 02/02/21 1059     Clinical Impression Statement Pt returns after 2 weeks since last session. Pt received cortisone shots for her hips and no longer has pain in her R hip. L hip is at 1/10. Pt is not longer relying on leaning onto furniture after getting up in the morning to get moving. Pt notices less pain when she is moving. Pt is able to sleep through the night with the injection and experience less pain.   Provided more manual Tx to mobility L SIJ/ hip abduction mm, leg to promote ER. Added DF/ EV strengthening on LE due to weakness.  Pt required less cues for wider BOS and longer stride in gait.  Progressed pt to 6 min walk test and provided education on not to overdo, to perform stretches after walking, and to walk on levelled ground not on her gravel. Provided pain science education, biopsychosocial approaches. Provided explanation to her self-selected exercise and rationale to not deviate from prescribed HEP.   Plan to continue with lower kinetic chain propioception training and advance baalnce CKC exercises.  Pt continues to benefit from skilled PT.    Personal Factors and Comorbidities Comorbidity 3+    Comorbidities hysterectomy, L kidney cancer  removed 2/2 Kidney cancer 2/ 2019,  A-fib, Fall on to tailbone a year ago. 3 vaginal deliveries with episiotomy. HTN, asthma, Hx of squamous cell carcinoma of skin ,high cholesterol    Examination-Activity Limitations Continence;Stand;Sit;Toileting    Stability/Clinical Decision Making Evolving/Moderate complexity    Rehab Potential Good    PT Frequency 1x / week     PT Duration Other (comment)   10   PT Treatment/Interventions ADLs/Self Care Home Management;Moist Heat;Traction;Therapeutic activities;Therapeutic exercise;Neuromuscular re-education;Manual techniques;Cryotherapy;Biofeedback;Scar mobilization;Dry needling;Energy conservation;Taping;Gait training;Stair training;Functional mobility training;Balance training;Joint Manipulations;Spinal Manipulations    Consulted and Agree with Plan of Care Patient             Patient will benefit from skilled therapeutic intervention in order to improve the following deficits and impairments:  Decreased balance, Difficulty walking, Impaired flexibility, Hypomobility, Decreased strength, Decreased range of motion, Decreased endurance, Decreased activity tolerance, Decreased coordination, Increased muscle spasms, Hypermobility, Decreased scar mobility, Abnormal gait, Impaired sensation, Improper body mechanics, Pain, Postural dysfunction, Decreased mobility, Decreased safety awareness, Increased fascial restricitons  Visit Diagnosis: Other abnormalities of gait and mobility  Other idiopathic scoliosis, thoracolumbar region  Sacrococcygeal disorders, not elsewhere classified  Diastasis recti     Problem List Patient Active Problem List   Diagnosis Date Noted   Chest pain of uncertain etiology 80/88/1103   Paroxysmal atrial fibrillation ([place]) 05/13/2020   Secondary hypercoagulable state ([place]) 05/13/2020   Atrial fibrillation with RVR ([place]) 02/12/2020   Mixed hyperlipidemia 01/28/2019   Stage 3 chronic kidney disease ([place]) 09/25/2018  Moderate persistent asthma 09/05/2018   Eosinophilia 09/05/2018   Essential hypertension 07/27/2018   Perennial allergic rhinitis with seasonal variation 07/27/2018   Pure hypercholesterolemia 07/27/2018   Chemotherapy induced nausea and vomiting 07/01/2017   Chemotherapy induced diarrhea 07/01/2017   Acute renal insufficiency 07/01/2017   Thyroid dysfunction  07/01/2017   Renal cell carcinoma of left kidney ([place]) 03/21/2017   Cancer of left kidney ([place]) 02/24/2017   Esophagitis, reflux 10/21/2013    Jerl Mina, PT 02/02/2021, 11:04 AM  [place] MAIN [facility] SERVICES [address] [place], [place], [address] Phone: [phone]   Fax:  [phone]  Name: Heidi Torres MRN: [id number] Date of Birth: [birth year]

## 2021-02-02 NOTE — Patient Instructions (Addendum)
Ankle strengthening on L with band band wrapped around outer L side of foot ballmound of L foot pressing onto band , R foot is placed on top of band hip width apart, with the ballmound ,  R hand holds the band 30 reps swinging L pinky toe out to the L  X 2x day   __  Walking 6 min on levelled ground  And perform stretches:  Facing wall: Hip flexor  Calf stretch Twist ( Front knee above ankle)   Quad stretch  Seated:  figure-4 thighs crossed, twist

## 2021-02-03 ENCOUNTER — Encounter: Payer: Medicare Other | Admitting: Physical Therapy

## 2021-02-09 ENCOUNTER — Encounter: Payer: Medicare Other | Admitting: Physical Therapy

## 2021-02-09 ENCOUNTER — Ambulatory Visit: Payer: Medicare Other | Admitting: Cardiology

## 2021-02-09 DIAGNOSIS — I480 Paroxysmal atrial fibrillation: Secondary | ICD-10-CM | POA: Diagnosis not present

## 2021-02-10 ENCOUNTER — Encounter: Payer: Medicare Other | Admitting: Physical Therapy

## 2021-02-12 NOTE — Telephone Encounter (Signed)
Patient states her reactions from [place] are getting worse. She is having rashes at the injection site with quarter size lumps. These reactions used to only last very little but now are lasting up to 24 hours. She is wanting to possibly go back to [place]. Patient would like to know if she needs to come in for an OV to discuss this with Dr. Ernst Bowler or if this change can be made.

## 2021-02-12 NOTE — Telephone Encounter (Signed)
Called and scheduled an appointment. 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 Patient stated she didn't mind going to our [place] office. Patient was schedule for Monday 03/08/2021 in our [place] office with Chrissie.

## 2021-02-12 NOTE — Telephone Encounter (Signed)
Let's make an appt. I looks like she was supposed to come back in a month back in September anyway. We can give her a sample of Nucala then as well.   Salvatore Marvel, MD [facility] [place]

## 2021-02-12 NOTE — Telephone Encounter (Signed)
Please advise Dr. Ernst Bowler, thank you.

## 2021-02-13 NOTE — Telephone Encounter (Signed)
Thank you for the update!

## 2021-02-15 NOTE — Telephone Encounter (Signed)
Sounds good - thanks, Chrissie!   Salvatore Marvel, MD [facility] [place]

## 2021-02-16 ENCOUNTER — Other Ambulatory Visit: Payer: Self-pay

## 2021-02-16 ENCOUNTER — Ambulatory Visit: Payer: Medicare Other | Attending: Obstetrics and Gynecology | Admitting: Physical Therapy

## 2021-02-16 DIAGNOSIS — M6208 Separation of muscle (nontraumatic), other site: Secondary | ICD-10-CM | POA: Insufficient documentation

## 2021-02-16 DIAGNOSIS — R2689 Other abnormalities of gait and mobility: Secondary | ICD-10-CM | POA: Diagnosis present

## 2021-02-16 DIAGNOSIS — M4125 Other idiopathic scoliosis, thoracolumbar region: Secondary | ICD-10-CM | POA: Diagnosis not present

## 2021-02-16 DIAGNOSIS — M533 Sacrococcygeal disorders, not elsewhere classified: Secondary | ICD-10-CM | POA: Diagnosis present

## 2021-02-16 NOTE — Patient Instructions (Addendum)
°  _________  Feet slides :   Points of contact at sitting bones  Four points of contact of foot, Heel up, ankle not twist out Lower heel Four points of contact of foot, Slide foot back   Repeated with other foot     __   Spread toes with manicure spreaders 30 min when relaxing  ___  Stretching after walking

## 2021-02-16 NOTE — Therapy (Signed)
[place] MAIN [facility] SERVICES [address] [place], [place], [address] Phone: [phone]   Fax:  [phone]  Physical Therapy Treatment  Patient Details  Name: Heidi Torres MRN: [id number] Date of Birth: 1955-12-23 Referring Provider (PT): Wannetta Sender   MD   Encounter Date: 02/16/2021   PT End of Session - 02/16/21 1525     Visit Number 8    Number of Visits 10    Date for PT Re-Evaluation 02/24/21    PT Start Time 7169    PT Stop Time 1604    PT Time Calculation (min) 54 min    Activity Tolerance Patient tolerated treatment well    Behavior During Therapy [facility] for tasks assessed/performed             Past Medical History:  Diagnosis Date   Asthma    Atrial fibrillation ([place])    Cataract cortical, senile    Chronic kidney disease    Complication of anesthesia    Eosinophilic esophagitis    Esophageal stricture    GERD (gastroesophageal reflux disease)    Headache    occular HA   History of hiatal hernia    Hx of dysplastic nevus    multiple sites   Hx of squamous cell carcinoma of skin 06/22/2015   R infrascapular, SCC in situ   Hyperlipidemia    Hypertension    Irregular heart beat    pac's and pvc's   Pneumonia    11-2016   PONV (postoperative nausea and vomiting)    Renal cell cancer, left ([place])    Status post dilation of esophageal narrowing     Past Surgical History:  Procedure Laterality Date   ABLATION     prior to hysterectomy   CYSTOSCOPY/RETROGRADE/URETEROSCOPY Left 01/20/2017   Procedure: CYSTOSCOPY/RETROGRADE/URETEROSCOPY/ BIOPSY,BLADDER BIOPSY PLACEMENT STENT LEFT URETER;  Surgeon: Festus Aloe, MD;  [facility];  Service: Urology;  Laterality: Left;   DILATION AND CURETTAGE OF UTERUS     ESOPHAGOGASTRODUODENOSCOPY N/A 12/16/2019   Procedure: ESOPHAGOGASTRODUODENOSCOPY (EGD);  Surgeon: Lesly Rubenstein, MD;  Location: [facility] ENDOSCOPY;  Service: Endoscopy;  Laterality: N/A;    ESOPHAGOGASTRODUODENOSCOPY (EGD) WITH PROPOFOL N/A 01/09/2019   Procedure: ESOPHAGOGASTRODUODENOSCOPY (EGD) WITH PROPOFOL;  Surgeon: Toledo, Benay Pike, MD;  Location: [facility] ENDOSCOPY;  Service: Gastroenterology;  Laterality: N/A;   NASAL ENDOSCOPY WITH EPISTAXIS CONTROL     ROBOTIC ASSITED PARTIAL NEPHRECTOMY Left 02/24/2017   Procedure: XI ROBOTIC ASSITED LEFT  RADICAL NEPHRECTOMY;  Surgeon: Alexis Frock, MD;  Location: [facility];  Service: Urology;  Laterality: Left;   TUBAL LIGATION     VAGINAL HYSTERECTOMY      There were no vitals filed for this visit.   Subjective Assessment - 02/16/21 1519     Subjective Pt reported she is paying attention to her feet placement to be wider.    Pertinent History hysterectomy, L kidney cancer  removed 2/2 Kidney cancer 2/ 2019,  A-fib, Fall on to tailbone a year ago. 3 vaginal deliveries with episiotomy. HTN, asthma, Hx of squamous cell carcinoma of skin ,high cholesterol    Patient Stated Goals get back to regular walking and sit and stand without hip/ SIJ pain, to strengthen pelvic area                [facility] PT Assessment - 02/16/21 1521       Observation/Other Assessments   Observations 20 deg hallux valgus R      Squat   Comments  poor knee alignment      Strength   Overall Strength Comments R hip abd 4-/5, L 5/5, B ankle ev 5/5      Palpation   Palpation comment tightness  at L GH , lower kinetic chain , midfoot hypomobile L                           [facility] Adult PT Treatment/Exercise - 02/16/21 1549       Neuro Re-ed    Neuro Re-ed Details  cued for new propropioception,  knee alignment and use of transverse arch B in seated position      Manual Therapy   Manual therapy comments STM/MWM at problem areas noted in assessment to promote  more ER of hip/  femur/tibia on L LE                          PT Long Term Goals - 02/02/21 1104       PT LONG TERM GOAL #1   Title Pt will decrease wearing  thin pads. from 2 x a day to < 1 x day in order to improve hygiene and QOL    Time 8    Period Weeks    Status On-going    Target Date 02/10/21      PT LONG TERM GOAL #2   Title Pt will report no more straining with bowel movments in order to minimize pelvic floor dysfunctions    Time 4    Period Weeks    Status On-going    Target Date 01/13/21      PT LONG TERM GOAL #3   Title Pt will report no  L glut/ SIJ pain when getting out of SUV and to lie in bed with comfort in order to improve ADLs    Time 10    Period Weeks    Status On-going    Target Date 02/24/21      PT LONG TERM GOAL #4   Title increase 0.9 m/s gait to > 1.2 m/s and demo more equal stance phase and reciporcal gait pattern  compared to decreased R stance phase, L hip hike and report no hip/ SIJ pain    Time 4    Period Weeks    Status On-going    Target Date 01/13/21      PT LONG TERM GOAL #5   Title Demo decreased 3 fingers width above umbilicus to < 1 fingers to optimize IAP system to progress to deep core and pelvic floor training    Time 2    Period Weeks    Status On-going    Target Date 12/30/20      PT LONG TERM GOAL #6   Title Pt will improve FOTO score increase of > 5 pt in categories of Pelvic pain, Urinary, BOwel  in order to improve function    Baseline Pelvic pain 63pt, PFDI 8, PFDI  Urinary    Time 10    Period Weeks    Status On-going    Target Date 02/24/21      PT LONG TERM GOAL #7   Title Pt will demo increased R hip mobilty with figure -4 stretch with less distance from tib plateau lateral border to floor ( 65 cm to < 64 cm) to perform stretch, don/doff shoes with more mobility    Baseline L 62 cm, R 65 cm tib plateau to  floor    Time 4    Period Weeks    Status On-going    Target Date 02/02/21                   Plan - 02/16/21 1555     Clinical Impression Statement Pt has returned to walking 20-30 min across 4-5 x week. This was a major goal for her. Pt demonstrates  improved hip abduction strength and increased hip mobility for crossing legs in figure-4 bilaterally. Pt's gait has longer stride and wider BOS.   Pt continued to require manual Tx to further promote hip /knee ER/ abd and DF/EV on L today. Pt demo'd improved mobility . Advanced to propioception training of lower kinetic chain in seated position. Pt demo'd correct form with cues. Plan to address pelvic/ abdominal areas next session. Pt continues to benefit from skilled PT   Personal Factors and Comorbidities Comorbidity 3+    Comorbidities hysterectomy, L kidney cancer  removed 2/2 Kidney cancer 2/ 2019,  A-fib, Fall on to tailbone a year ago. 3 vaginal deliveries with episiotomy. HTN, asthma, Hx of squamous cell carcinoma of skin ,high cholesterol    Examination-Activity Limitations Continence;Stand;Sit;Toileting    Stability/Clinical Decision Making Evolving/Moderate complexity    Rehab Potential Good    PT Frequency 1x / week    PT Duration Other (comment)   10   PT Treatment/Interventions ADLs/Self Care Home Management;Moist Heat;Traction;Therapeutic activities;Therapeutic exercise;Neuromuscular re-education;Manual techniques;Cryotherapy;Biofeedback;Scar mobilization;Dry needling;Energy conservation;Taping;Gait training;Stair training;Functional mobility training;Balance training;Joint Manipulations;Spinal Manipulations    Consulted and Agree with Plan of Care Patient             Patient will benefit from skilled therapeutic intervention in order to improve the following deficits and impairments:  Decreased balance, Difficulty walking, Impaired flexibility, Hypomobility, Decreased strength, Decreased range of motion, Decreased endurance, Decreased activity tolerance, Decreased coordination, Increased muscle spasms, Hypermobility, Decreased scar mobility, Abnormal gait, Impaired sensation, Improper body mechanics, Pain, Postural dysfunction, Decreased mobility, Decreased safety awareness,  Increased fascial restricitons  Visit Diagnosis: Other idiopathic scoliosis, thoracolumbar region  Sacrococcygeal disorders, not elsewhere classified  Other abnormalities of gait and mobility  Diastasis recti     Problem List Patient Active Problem List   Diagnosis Date Noted   Chest pain of uncertain etiology 11/65/7903   Paroxysmal atrial fibrillation ([place]) 05/13/2020   Secondary hypercoagulable state ([place]) 05/13/2020   Atrial fibrillation with RVR ([place]) 02/12/2020   Mixed hyperlipidemia 01/28/2019   Stage 3 chronic kidney disease ([place]) 09/25/2018   Moderate persistent asthma 09/05/2018   Eosinophilia 09/05/2018   Essential hypertension 07/27/2018   Perennial allergic rhinitis with seasonal variation 07/27/2018   Pure hypercholesterolemia 07/27/2018   Chemotherapy induced nausea and vomiting 07/01/2017   Chemotherapy induced diarrhea 07/01/2017   Acute renal insufficiency 07/01/2017   Thyroid dysfunction 07/01/2017   Renal cell carcinoma of left kidney ([place]) 03/21/2017   Cancer of left kidney ([place]) 02/24/2017   Esophagitis, reflux 10/21/2013    Jerl Mina, PT 02/16/2021, 4:01 PM  [place] [facility] MAIN [facility] SERVICES [address] [place], [place], [address] Phone: [phone]   Fax:  [phone]  Name: Heidi Torres MRN: [id number] Date of Birth: 07-09-1955

## 2021-02-23 ENCOUNTER — Ambulatory Visit: Payer: Medicare Other | Admitting: Physical Therapy

## 2021-03-01 ENCOUNTER — Other Ambulatory Visit: Payer: Self-pay | Admitting: Dermatology

## 2021-03-01 DIAGNOSIS — L719 Rosacea, unspecified: Secondary | ICD-10-CM

## 2021-03-02 ENCOUNTER — Ambulatory Visit: Payer: Medicare Other | Admitting: Physical Therapy

## 2021-03-03 ENCOUNTER — Ambulatory Visit: Payer: Medicare Other | Admitting: Dermatology

## 2021-03-05 NOTE — Patient Instructions (Addendum)
1. Moderate persistent asthma, uncomplicated ?- Use your ProAir only as needed rather than every night ?- I will send a message to Tammy, our biologics coordinator, about getting Nucala approved. She will be in contact with you ?- Start Flovent 110 mcg 2 puffs twice a day with spacer to help prevent cough and wheeze ? ?2. Eosinophilic esophagitis ?- Testing to foods was negative on 06/26/2020, but food testing does not always correlate with the symptoms of EoE ?- Stop Dupixent injections due rash, being painful and lumps at injection sites ?-Recommend contacting your GI doctor, Dr. Carlean Purl, about what he recommends for treatment of eosinophilic esophagitis since you are wanting to stop Dupixent injections ? ?3. Perennial allergic rhinitis (dust mite) ?- Continue with your drops and Claritin as needed.  ? ?Please let us know if this treatment plan is not working well for you ?Schedule a follow up appointment in 4-6 weeks with Dr. Ernst Bowler ?

## 2021-03-08 ENCOUNTER — Encounter: Payer: Self-pay | Admitting: Allergy & Immunology

## 2021-03-08 ENCOUNTER — Other Ambulatory Visit: Payer: Self-pay

## 2021-03-08 ENCOUNTER — Encounter: Payer: Self-pay | Admitting: Family

## 2021-03-08 ENCOUNTER — Ambulatory Visit (INDEPENDENT_AMBULATORY_CARE_PROVIDER_SITE_OTHER): Payer: Medicare Other | Admitting: Family

## 2021-03-08 VITALS — BP 116/72 | HR 68 | Temp 98.1°F | Resp 16 | Ht 65.0 in | Wt 163.1 lb

## 2021-03-08 DIAGNOSIS — K200 Eosinophilic esophagitis: Secondary | ICD-10-CM

## 2021-03-08 DIAGNOSIS — J4540 Moderate persistent asthma, uncomplicated: Secondary | ICD-10-CM

## 2021-03-08 DIAGNOSIS — J310 Chronic rhinitis: Secondary | ICD-10-CM

## 2021-03-08 MED ORDER — EPINEPHRINE 0.3 MG/0.3ML IJ SOAJ: 0.3000 mg | INTRAMUSCULAR | 2 refills | Status: DC | PRN

## 2021-03-08 MED ORDER — FLUTICASONE PROPIONATE HFA 110 MCG/ACT IN AERO: INHALATION_SPRAY | RESPIRATORY_TRACT | 3 refills | Status: DC

## 2021-03-08 NOTE — Progress Notes (Signed)
? ?[place] [address] ?Dept: [phone] ? ?FOLLOW UP NOTE ? ?Patient ID: Heidi Torres, adult    DOB: 08/21/1955  Age: 66 y.o. MRN: [id number] ?Date of Office Visit: 03/08/2021 ? ?Assessment  ?Chief Complaint: Follow-up (Wants to switch back to [facility]) ? ?HPI ?Zamara Cozad Coakley is a 66 year old female who presents today for follow-up of moderate persistent asthma, eosinophilic esophagitis, and perennial allergic rhinitis.  Since her last office visit she had a Bravo pH monitor completed on January 18, 2020.  Interpretation shows, "does have reflux episodes but does not trigger A-fib total DeMeester score in nonpathologic range." ? ?Moderate persistent asthma is reported as not well controlled with albuterol as needed.  She is no longer seen pulmonary because he now works in [place], [place].  She reports for the past 2 weeks she has had wheezing, coughing due to postnasal drip and from her chest, and tightness at times.  She denies shortness of breath and nocturnal awakenings due to breathing problems.  Since her last office visit she has not required any systemic steroids or made any trips to the emergency room or urgent care due to breathing problems.  She has used her albuterol four out of the 7 days for the past 2 weeks.  She is interested in restarting Nucala to better help her asthma.  She has been on Nucala in the past and reports that it helped bring her eosinophils down.  She also mentions that she has stage III-IV kidney disease and that her kidney doctor was okay with her getting the Nucala injections. ? ?Eosinophilic esophagitis is reported as moderately controlled with no medications at this time.  She stopped taking her Dupixent injections every week due to the injections causing quarter size lumps at the injection site with an even bigger rash.  She also mentions that the injections were painful.  She tried having someone give her the injections in her arms and they were  excruciating.  She thinks that her last Dupixent injection was approximately 2 months ago.  She denies any food impaction, but mentions that she has to be careful with eating carrots and apples.  She denies difficulty swallowing.  She also denies heartburn and reflux but reports sometimes she feels it but it is not that.  She denies regurgitation and abdominal pain.  She does report that she will wake up at times with a pressure on her chest and her heart will beat fast and hard.  If she can burp, drink water, sit up, and take Gaviscon this helps.  She has spoken with both her GI doctor and her cardiologist about this.  She mentions that she has a history of A-fib and that her cardiologist does not want to do an ablation at this time due to her not frequently being in A-fib.  She has not told her GI doctor about stopping Dupixent injections. ? ?Chronic rhinitis is reported as moderately controlled with Claritin as needed.  She mentions that she very rarely takes Claritin.  She reports off-and-on rhinorrhea, nasal congestion, and postnasal drip.  She has not had any sinus infections since we last saw her. ? ? ?Drug Allergies:  ?Allergies  ?Allergen Reactions  ? Benzoin   ?  Other reaction(s): Other (See Comments) ?Blisters  ? Ciprofloxacin Other (See Comments)  ?  Arms were numb  ? Steri-Strip Compound Benzoin [Benzoin Compound] Other (See Comments)  ?  Blisters  ? ? ?Review of Systems: ?Review of Systems  ?  Constitutional:  Negative for chills and fever.  ?HENT:    ?     Reports off-and-on rhinorrhea, nasal congestion, and postnasal drip.  ?Eyes:   ?     Denies itchy eyes and reports watery eyes  ?Respiratory:  Positive for cough and wheezing. Negative for shortness of breath.   ?     Reports cough at times that is due to postnasal drip and other times she feels is coming from her chest.  She also reports wheezing at times and tightness in her chest at times.  Denies shortness of breath and nocturnal awakenings due  to breathing problems.  ?Cardiovascular:  Negative for chest pain and palpitations.  ?     She reports at times waking up with a pressure in her chest and her heart will beat fast and hard.  She will begin belching, she will drink water, and sit up and take Gaviscon and this helps.  She is spoken with her GI doctor about this and her cardiologist.  ?Gastrointestinal:   ?     Denies food impaction, but has to be careful with carrots and apples.  Denies difficulty swallowing denies heartburn and reflux but then reports sometimes she feels it but is not.  Reports belching.  Denies regurgitation and abdominal pain  ?Genitourinary:  Negative for frequency.  ?Skin:  Negative for itching and rash.  ? ? ?Physical Exam: ?BP 116/72   Pulse 68   Temp 98.1 ?F (36.7 ?C)   Resp 16   Ht '5\' 5"'$  (1.651 m)   Wt 163 lb 2 oz (74 kg)   LMP  (LMP Unknown)   SpO2 97%   BMI 27.15 kg/m?   ? ?Physical Exam ?Constitutional:   ?   Appearance: Normal appearance.  ?HENT:  ?   Head: Normocephalic and atraumatic.  ?   Comments: Pharynx normal, eyes normal, ears normal, nose normal ?   Right Ear: Tympanic membrane, ear canal and external ear normal.  ?   Left Ear: Tympanic membrane, ear canal and external ear normal.  ?   Nose: Nose normal.  ?   Mouth/Throat:  ?   Mouth: Mucous membranes are moist.  ?   Pharynx: Oropharynx is clear.  ?Eyes:  ?   Conjunctiva/sclera: Conjunctivae normal.  ?Cardiovascular:  ?   Rate and Rhythm: Normal rate and regular rhythm.  ?   Heart sounds: Normal heart sounds.  ?Pulmonary:  ?   Effort: Pulmonary effort is normal.  ?   Breath sounds: Normal breath sounds.  ?   Comments: Lungs clear to auscultation ?Musculoskeletal:  ?   Cervical back: Neck supple.  ?Skin: ?   General: Skin is warm.  ?Neurological:  ?   Mental Status: She is alert and oriented to person, place, and time.  ?Psychiatric:     ?   Mood and Affect: Mood normal.     ?   Behavior: Behavior normal.     ?   Thought Content: Thought content normal.      ?   Judgment: Judgment normal.  ? ? ?Diagnostics: ?FVC 2.22 L, FEV1 1.59 L (66%).  Predicted FVC 3.08 L, predicted FEV1 2.40 L.  Spirometry indicates possible moderate restriction. ? ?Assessment and Plan: ?1. Moderate persistent asthma, uncomplicated   ?2. Eosinophilic esophagitis   ?3. Chronic rhinitis   ? ? ?No orders of the defined types were placed in this encounter. ? ? ?Patient Instructions  ?1. Moderate persistent asthma, uncomplicated ?- Use your ProAir  only as needed rather than every night ?- I will send a message to Tammy, our biologics coordinator, about getting Nucala approved. She will be in contact with you ?- Start Flovent 110 mcg 2 puffs twice a day with spacer to help prevent cough and wheeze ? ?2. Eosinophilic esophagitis ?- Testing to foods was negative on 06/26/2020, but food testing does not always correlate with the symptoms of EoE ?- Stop Dupixent injections due rash, being painful and lumps at injection sites ?-Recommend contacting your GI doctor, Dr. Carlean Purl, about what he recommends for treatment of eosinophilic esophagitis since you are wanting to stop Dupixent injections ? ?3. Perennial allergic rhinitis (dust mite) ?- Continue with your drops and Claritin as needed.  ? ?Please let us know if this treatment plan is not working well for you ?Schedule a follow up appointment in 4-6 weeks with Dr. Ernst Bowler ? ?Return in about 4 weeks (around 04/05/2021), or if symptoms worsen or fail to improve. ?  ? ?Thank you for the opportunity to care for this patient.  Please do not hesitate to contact me with questions. ? ?Althea Charon, FNP ?[facility] ? ? ? ? ?

## 2021-03-09 ENCOUNTER — Ambulatory Visit: Payer: Medicare Other | Attending: Obstetrics and Gynecology | Admitting: Physical Therapy

## 2021-03-09 VITALS — BP 110/80

## 2021-03-09 DIAGNOSIS — M6208 Separation of muscle (nontraumatic), other site: Secondary | ICD-10-CM | POA: Diagnosis not present

## 2021-03-09 DIAGNOSIS — M4125 Other idiopathic scoliosis, thoracolumbar region: Secondary | ICD-10-CM | POA: Diagnosis present

## 2021-03-09 DIAGNOSIS — R2689 Other abnormalities of gait and mobility: Secondary | ICD-10-CM

## 2021-03-09 DIAGNOSIS — M533 Sacrococcygeal disorders, not elsewhere classified: Secondary | ICD-10-CM

## 2021-03-09 NOTE — Patient Instructions (Signed)
Reinforced deep core level 2 for full 6 min  ? ?Clams shells on R  ? ? ?

## 2021-03-09 NOTE — Addendum Note (Signed)
Addended by: Eloy End D on: 03/09/2021 11:34 AM ? ? Modules accepted: Orders ? ?

## 2021-03-09 NOTE — Therapy (Signed)
[place] MAIN [facility] SERVICES [address] [place], [place], [address] Phone: [phone]   Fax:  [phone]  Physical Therapy Treatment  Patient Details  Name: Heidi Torres MRN: [id number] Date of Birth: 11/15/1955 Referring Provider (PT): Wannetta Sender   MD   Encounter Date: 03/09/2021   PT End of Session - 03/09/21 1438     Visit Number 9    Date for PT Re-Evaluation 05/18/21    PT Start Time 6144    PT Stop Time 1510    PT Time Calculation (min) 44 min    Activity Tolerance Patient tolerated treatment well    Behavior During Therapy [facility] for tasks assessed/performed             Past Medical History:  Diagnosis Date   Asthma    Atrial fibrillation ([place])    Cataract cortical, senile    Chronic kidney disease    Complication of anesthesia    Eosinophilic esophagitis    Esophageal stricture    GERD (gastroesophageal reflux disease)    Headache    occular HA   History of hiatal hernia    Hx of dysplastic nevus    multiple sites   Hx of squamous cell carcinoma of skin 06/22/2015   R infrascapular, SCC in situ   Hyperlipidemia    Hypertension    Irregular heart beat    pac's and pvc's   Pneumonia    11-2016   PONV (postoperative nausea and vomiting)    Renal cell cancer, left ([place])    Status post dilation of esophageal narrowing     Past Surgical History:  Procedure Laterality Date   ABLATION     prior to hysterectomy   CYSTOSCOPY/RETROGRADE/URETEROSCOPY Left 01/20/2017   Procedure: CYSTOSCOPY/RETROGRADE/URETEROSCOPY/ BIOPSY,BLADDER BIOPSY PLACEMENT STENT LEFT URETER;  Surgeon: Festus Aloe, MD;  Location: [facility] ORS;  Service: Urology;  Laterality: Left;   DILATION AND CURETTAGE OF UTERUS     ESOPHAGOGASTRODUODENOSCOPY N/A 12/16/2019   Procedure: ESOPHAGOGASTRODUODENOSCOPY (EGD);  Surgeon: Lesly Rubenstein, MD;  Location: [facility] ENDOSCOPY;  Service: Endoscopy;  Laterality: N/A;   ESOPHAGOGASTRODUODENOSCOPY (EGD)  WITH PROPOFOL N/A 01/09/2019   Procedure: ESOPHAGOGASTRODUODENOSCOPY (EGD) WITH PROPOFOL;  Surgeon: Toledo, Benay Pike, MD;  Location: [facility] ENDOSCOPY;  Service: Gastroenterology;  Laterality: N/A;   NASAL ENDOSCOPY WITH EPISTAXIS CONTROL     ROBOTIC ASSITED PARTIAL NEPHRECTOMY Left 02/24/2017   Procedure: XI ROBOTIC ASSITED LEFT  RADICAL NEPHRECTOMY;  Surgeon: Alexis Frock, MD;  Location: [facility] ORS;  Service: Urology;  Laterality: Left;   TUBAL LIGATION     VAGINAL HYSTERECTOMY      Vitals:   03/09/21 1436  BP: 110/80     Subjective Assessment - 03/09/21 1517     Subjective Pt reported she eat food fast and she got the feeling like her pressure on her chest. Pt started burping. Pt had not drank water all day. Pt felt light headed at first. Pt took her anti acid medication. Pt stayed in her car until she felt better to walk into clinic.    Pertinent History hysterectomy, L kidney cancer  removed 2/2 Kidney cancer 2/ 2019,  A-fib, Fall on to tailbone a year ago. 3 vaginal deliveries with episiotomy. HTN, asthma, Hx of squamous cell carcinoma of skin ,high cholesterol                [facility] PT Assessment - 03/09/21 1518       Strength   Overall Strength Comments hip abd  R 4-/5, L 5/5      Palpation   Palpation comment tightness between ray I-II, adductor transverse head / plantar arch tightness,      Ambulation/Gait   Gait velocity 1.64ms                           [facility] Adult PT Treatment/Exercise - 03/09/21 1519       Therapeutic Activites    Other Therapeutic Activities reassessed goals for recert      Manual Therapy   Manual therapy comments STM/MWM at problem areas noted in assessment to promote R toe abd                          PT Long Term Goals - 03/09/21 1439       PT LONG TERM GOAL #1   Title Pt will decrease wearing thin pads. from 2 x a day to < 1 x day in order to improve hygiene and QOL  ( 03/09/21: one during the day and  none at night)    Time 8    Period Weeks    Status Achieved    Target Date 02/10/21      PT LONG TERM GOAL #2   Title Pt will report no more straining with bowel movments in order to minimize pelvic floor dysfunctions    Baseline 03/09/21: straining 50% less    Time 4    Period Weeks    Status Partially Met    Target Date 04/06/21      PT LONG TERM GOAL #3   Title Pt will report no  L glut/ SIJ pain when getting out of SUV and to lie in bed with comfort in order to improve ADLs    Time 10    Period Weeks    Status Achieved    Target Date 02/24/21      PT LONG TERM GOAL #4   Title increase 0.9 m/s gait to > 1.2 m/s and demo more equal stance phase and reciporcal gait pattern  compared to decreased R stance phase, L hip hike and report no hip/ SIJ pain  ( 03/09/21: 1.1 m/s, reciporcal gait)    Time 6    Period Weeks    Status Partially Met    Target Date 04/20/21      PT LONG TERM GOAL #5   Title Demo decreased 3 fingers width above umbilicus to < 1 fingers to optimize IAP system to progress to deep core and pelvic floor training    Time 4    Period Weeks    Status On-going    Target Date 04/06/21      PT LONG TERM GOAL #6   Title Pt will improve FOTO score increase of > 5 pt in categories of Pelvic pain, Urinary, BOwel  in order to improve function    Baseline Pelvic pain 63pt, PFDI 8, PFDI  Urinary   ( 03/09/21: Bowel 7 pt change, other categories with less change )    Time 10    Period Weeks    Status Partially Met    Target Date 02/24/21      PT LONG TERM GOAL #7   Title Pt will demo increased R hip mobilty with figure -4 stretch with less distance from tib plateau lateral border to floor ( 65 cm to < 64 cm) to perform stretch, don/doff shoes with more mobility  Baseline L 62 cm, R 65 cm tib plateau to floor  ( 02/16/21: 61 cm B )    Time 4    Period Weeks    Status Achieved    Target Date 02/02/21      PT LONG TERM GOAL #8   Title Pt report sleeping with pillow  between knees to prevent crossing ankles    Time 4    Period Weeks    Status Achieved    Target Date 03/16/21      PT LONG TERM GOAL  #9   TITLE Pt will demo less hallus valgus from 20 deg on R to < 15 deg for more balance and stronger push off with walking.    Time 10    Period Weeks    Status On-going    Target Date 04/27/21                   Plan - 03/09/21 1439     Clinical Impression Statement Pt achieved 4/9 goals and progressing well towards remaining goals. Pt has decreased urinary pads. Hip mobility has increased and pt has returned to walking longer distances with less hip pain. Pt's compliance to HEP and manual Tx to address her asymmetries to pelvic and spinal alignment in addition to past hip injection  helped with pt's walking longer distances.   Reassessed goals today for recert. Plan to continue with more Tx and HEP to help further improve diastasis recti where pt has a well healed incision scar. Provided encouragement to be compliant with deep core  HEP to help improve diastasis and trunk/ hip stability. Applied manual Tx today at R foot to promote stronger push off and gait mechanics. Pt continues to benefit from skilled PT. Plan to assess pelvic floor at upcoming sessions and help pt progress to fitness exercises with modifications.   Pt arrived late to session. BP was monitored after pt reported not drinking enough water, eating too fast and had chest pain prior to arrive to clinic. BP was 110/80 mm Hg. Pt reported she took her anti-acid medication before coming to clinic.      Personal Factors and Comorbidities Comorbidity 3+    Comorbidities hysterectomy, L kidney cancer  removed 2/2 Kidney cancer 2/ 2019,  A-fib, Fall on to tailbone a year ago. 3 vaginal deliveries with episiotomy. HTN, asthma, Hx of squamous cell carcinoma of skin ,high cholesterol    Examination-Activity Limitations Continence;Stand;Sit;Toileting    Stability/Clinical Decision Making  Evolving/Moderate complexity    Rehab Potential Good    PT Frequency 1x / week    PT Duration Other (comment)   10   PT Treatment/Interventions ADLs/Self Care Home Management;Moist Heat;Traction;Therapeutic activities;Therapeutic exercise;Neuromuscular re-education;Manual techniques;Cryotherapy;Biofeedback;Scar mobilization;Dry needling;Energy conservation;Taping;Gait training;Stair training;Functional mobility training;Balance training;Joint Manipulations;Spinal Manipulations    Consulted and Agree with Plan of Care Patient             Patient will benefit from skilled therapeutic intervention in order to improve the following deficits and impairments:  Decreased balance, Difficulty walking, Impaired flexibility, Hypomobility, Decreased strength, Decreased range of motion, Decreased endurance, Decreased activity tolerance, Decreased coordination, Increased muscle spasms, Hypermobility, Decreased scar mobility, Abnormal gait, Impaired sensation, Improper body mechanics, Pain, Postural dysfunction, Decreased mobility, Decreased safety awareness, Increased fascial restricitons  Visit Diagnosis: Other idiopathic scoliosis, thoracolumbar region  Sacrococcygeal disorders, not elsewhere classified  Diastasis recti  Other abnormalities of gait and mobility     Problem List Patient Active Problem List   Diagnosis Date  Noted   Chest pain of uncertain etiology 13/68/5992   Paroxysmal atrial fibrillation ([place]) 05/13/2020   Secondary hypercoagulable state ([place]) 05/13/2020   Atrial fibrillation with RVR ([place]) 02/12/2020   Mixed hyperlipidemia 01/28/2019   Stage 3 chronic kidney disease ([place]) 09/25/2018   Moderate persistent asthma 09/05/2018   Eosinophilia 09/05/2018   Essential hypertension 07/27/2018   Perennial allergic rhinitis with seasonal variation 07/27/2018   Pure hypercholesterolemia 07/27/2018   Chemotherapy induced nausea and vomiting 07/01/2017   Chemotherapy induced  diarrhea 07/01/2017   Acute renal insufficiency 07/01/2017   Thyroid dysfunction 07/01/2017   Renal cell carcinoma of left kidney ([place]) 03/21/2017   Cancer of left kidney ([place]) 02/24/2017   Esophagitis, reflux 10/21/2013    Jerl Mina, PT 03/09/2021, 3:30 PM  [place] MAIN [facility] SERVICES [address] [place], [place], [address] Phone: [phone]   Fax:  [phone]  Name: Heidi Torres MRN: [id number] Date of Birth: 1955/04/01

## 2021-03-11 ENCOUNTER — Telehealth: Payer: Self-pay | Admitting: *Deleted

## 2021-03-11 MED ORDER — NUCALA 100 MG/ML ~~LOC~~ SOAJ: 100.0000 mg | SUBCUTANEOUS | 11 refills | Status: DC

## 2021-03-11 NOTE — Telephone Encounter (Signed)
Thank you :)

## 2021-03-11 NOTE — Telephone Encounter (Signed)
-----   Message from Althea Charon, [place] sent at 03/08/2021 12:53 PM EST ----- ?Wants to restart Nucala for moderate persistent asthma. Stopped Dupixent for EOE approximately 2 months ago ?

## 2021-03-11 NOTE — Telephone Encounter (Signed)
Called patient and advised that [facility] still has active approval for Nucala on file so I will send a Erx to [facility] and given phone number for same to check Rx and schedule delivery to her home.  Also given number to copay card in case they no longer have that info on file ?

## 2021-03-12 ENCOUNTER — Ambulatory Visit: Payer: Medicare Other | Admitting: Physician Assistant

## 2021-03-22 ENCOUNTER — Ambulatory Visit: Payer: Medicare Other | Admitting: Physician Assistant

## 2021-03-22 ENCOUNTER — Other Ambulatory Visit: Payer: Self-pay | Admitting: Emergency Medicine

## 2021-03-22 DIAGNOSIS — C642 Malignant neoplasm of left kidney, except renal pelvis: Secondary | ICD-10-CM

## 2021-03-23 ENCOUNTER — Ambulatory Visit: Payer: Medicare Other | Admitting: Physical Therapy

## 2021-03-23 ENCOUNTER — Other Ambulatory Visit: Payer: Self-pay

## 2021-03-23 DIAGNOSIS — R2689 Other abnormalities of gait and mobility: Secondary | ICD-10-CM

## 2021-03-23 DIAGNOSIS — M4125 Other idiopathic scoliosis, thoracolumbar region: Secondary | ICD-10-CM

## 2021-03-23 DIAGNOSIS — M533 Sacrococcygeal disorders, not elsewhere classified: Secondary | ICD-10-CM

## 2021-03-23 DIAGNOSIS — M6208 Separation of muscle (nontraumatic), other site: Secondary | ICD-10-CM | POA: Diagnosis not present

## 2021-03-23 NOTE — Therapy (Signed)
[place] [facility] ?[place], [place], [address] ?Phone: [phone]   Fax:  [phone] ? ?Physical Therapy Treatment / Progress Note reporting from  12/15/21 to 03/23/21 10 visits  ? ?Patient Details  ?Name: Heidi Torres ?MRN: [id number] ?Date of Birth: 1955-08-31 ?Referring Provider (PT): Wannetta Sender   MD ? ? ?Encounter Date: 03/23/2021 ? ? PT End of Session - 03/23/21 1525   ? ? Visit Number 10   ? Date for PT Re-Evaluation 05/18/21   ? PT Start Time 1324   ? PT Stop Time 1600   ? PT Time Calculation (min) 43 min   ? Activity Tolerance Patient tolerated treatment well   ? Behavior During Therapy [facility] for tasks assessed/performed   ? ?  ?  ? ?  ? ? ?Past Medical History:  ?Diagnosis Date  ? Asthma   ? Atrial fibrillation ([place])   ? Cataract cortical, senile   ? Chronic kidney disease   ? Complication of anesthesia   ? Eosinophilic esophagitis   ? Esophageal stricture   ? GERD (gastroesophageal reflux disease)   ? Headache   ? occular HA  ? History of hiatal hernia   ? Hx of dysplastic nevus   ? multiple sites  ? Hx of squamous cell carcinoma of skin 06/22/2015  ? R infrascapular, SCC in situ  ? Hyperlipidemia   ? Hypertension   ? Irregular heart beat   ? pac's and pvc's  ? Pneumonia   ? 11-2016  ? PONV (postoperative nausea and vomiting)   ? Renal cell cancer, left ([place])   ? Status post dilation of esophageal narrowing   ? ? ?Past Surgical History:  ?Procedure Laterality Date  ? ABLATION    ? prior to hysterectomy  ? CYSTOSCOPY/RETROGRADE/URETEROSCOPY Left 01/20/2017  ? Procedure: CYSTOSCOPY/RETROGRADE/URETEROSCOPY/ BIOPSY,BLADDER BIOPSY PLACEMENT STENT LEFT URETER;  Surgeon: Festus Aloe, MD;  Location: [facility];  Service: Urology;  Laterality: Left;  ? DILATION AND CURETTAGE OF UTERUS    ? ESOPHAGOGASTRODUODENOSCOPY N/A 12/16/2019  ? Procedure: ESOPHAGOGASTRODUODENOSCOPY (EGD);  Surgeon: Lesly Rubenstein, MD;  Location: [facility];  Service:  Endoscopy;  Laterality: N/A;  ? ESOPHAGOGASTRODUODENOSCOPY (EGD) WITH PROPOFOL N/A 01/09/2019  ? Procedure: ESOPHAGOGASTRODUODENOSCOPY (EGD) WITH PROPOFOL;  Surgeon: Toledo, Benay Pike, MD;  Location: [facility] ENDOSCOPY;  Service: Gastroenterology;  Laterality: N/A;  ? NASAL ENDOSCOPY WITH EPISTAXIS CONTROL    ? ROBOTIC ASSITED PARTIAL NEPHRECTOMY Left 02/24/2017  ? Procedure: XI ROBOTIC ASSITED LEFT  RADICAL NEPHRECTOMY;  Surgeon: Alexis Frock, MD;  Location: [facility];  Service: Urology;  Laterality: Left;  ? TUBAL LIGATION    ? VAGINAL HYSTERECTOMY    ? ? ?There were no vitals filed for this visit. ? ? Subjective Assessment - 03/23/21 1526   ? ? Subjective Pt has been doing her deep core HEP every other day first thing in the morning, but only for 3 mins instead of 6 mins. Pt reported she tried the mini-squats but it caused pain at her back so she stopped doing them.   ? Pertinent History hysterectomy, L kidney cancer  removed 2/2 Kidney cancer 2/ 2019,  A-fib, Fall on to tailbone a year ago. 3 vaginal deliveries with episiotomy. HTN, asthma, Hx of squamous cell carcinoma of skin ,high cholesterol   ? ?  ?  ? ?  ? ? ? ? ? [facility] PT Assessment - 03/23/21 1527   ? ?  ? Coordination  ? Coordination and Movement Description  abdominal straining with inhalation   ?  ? Bed Mobility  ? Bed Mobility --   poor technique with straining of abdominal mm  ? ?  ?  ? ?  ? ? ? ? ? ? ? ? ? ? ? ? ? Pelvic Floor Special Questions - 03/23/21 1531   ? ? External Perineal Exam through clothing, tightness at obt int L > R , B SIJ hypomobile   ? ?  ?  ? ?  ? ? ? ? [facility] Adult PT Treatment/Exercise - 03/23/21 1527   ? ?  ? Therapeutic Activites   ? Other Therapeutic Activities explained therapist can not diagnosis hernias, explained deep core and proper body mechanics help with abdominal support by DRA , explained the importance of compliance for mm strengthening , provided strategy of using timer to ensure she performs the complete 6 min of deep  core levle 2 instead of level 1   ?  ? Neuro Re-ed   ? Neuro Re-ed Details  cued for less ab straining with deep core   ?  ? Manual Therapy  ? Manual therapy comments STM/MWM at problem areas noted in assessment to SIJ mobility and pelvic floor lengthening   ? ?  ?  ? ?  ? ? ? ? ? ? ? ? ? ? ? ? ? ? ? PT Long Term Goals - 03/09/21 1439   ? ?  ? PT LONG TERM GOAL #1  ? Title Pt will decrease wearing thin pads. from 2 x a day to < 1 x day in order to improve hygiene and QOL  ( 03/09/21: one during the day and none at night)   ? Time 8   ? Period Weeks   ? Status Achieved   ? Target Date 02/10/21   ?  ? PT LONG TERM GOAL #2  ? Title Pt will report no more straining with bowel movments in order to minimize pelvic floor dysfunctions   ? Baseline 03/09/21: straining 50% less   ? Time 4   ? Period Weeks   ? Status Partially Met   ? Target Date 04/06/21   ?  ? PT LONG TERM GOAL #3  ? Title Pt will report no  L glut/ SIJ pain when getting out of SUV and to lie in bed with comfort in order to improve ADLs   ? Time 10   ? Period Weeks   ? Status Achieved   ? Target Date 02/24/21   ?  ? PT LONG TERM GOAL #4  ? Title increase 0.9 m/s gait to > 1.2 m/s and demo more equal stance phase and reciporcal gait pattern  compared to decreased R stance phase, L hip hike and report no hip/ SIJ pain  ( 03/09/21: 1.1 m/s, reciporcal gait)   ? Time 6   ? Period Weeks   ? Status Partially Met   ? Target Date 04/20/21   ?  ? PT LONG TERM GOAL #5  ? Title Demo decreased 3 fingers width above umbilicus to < 1 fingers to optimize IAP system to progress to deep core and pelvic floor training   ? Time 4   ? Period Weeks   ? Status On-going   ? Target Date 04/06/21   ?  ? PT LONG TERM GOAL #6  ? Title Pt will improve FOTO score increase of > 5 pt in categories of Pelvic pain, Urinary, BOwel  in order to improve function   ?  Baseline Pelvic pain 63pt, PFDI 8, PFDI  Urinary   ( 03/09/21: Bowel 7 pt change, other categories with less change )   ? Time 10   ?  Period Weeks   ? Status Partially Met   ? Target Date 02/24/21   ?  ? PT LONG TERM GOAL #7  ? Title Pt will demo increased R hip mobilty with figure -4 stretch with less distance from tib plateau lateral border to floor ( 65 cm to < 64 cm) to perform stretch, don/doff shoes with more mobility   ? Baseline L 62 cm, R 65 cm tib plateau to floor  ( 02/16/21: 61 cm B )   ? Time 4   ? Period Weeks   ? Status Achieved   ? Target Date 02/02/21   ?  ? PT LONG TERM GOAL #8  ? Title Pt report sleeping with pillow between knees to prevent crossing ankles   ? Time 4   ? Period Weeks   ? Status Achieved   ? Target Date 03/16/21   ?  ? PT LONG TERM GOAL  #9  ? TITLE Pt will demo less hallus valgus from 20 deg on R to < 15 deg for more balance and stronger push off with walking.   ? Time 10   ? Period Weeks   ? Status On-going   ? Target Date 04/27/21   ? ?  ?  ? ?  ? ? ? ? ? ? ? ? Plan - 03/23/21 1525   ? ? Clinical Impression Statement Pt arrived late, thus session was abbreviated. ? ?Pt achieved 4/9 goals and progressing well towards remaining goals. Pt has decreased urinary pads. Hip mobility has increased and pt has returned to walking longer distances with less hip pain. Pt's return to walking longer distances was made possible by pt's compliance to HEP in addition to manual Tx that addressed her asymmetries to pelvic and spinal alignment . Pt also had a hip injection. ? ?Today focused on strategies to continue increasing her compliance to HEP. Pt required cues for less abdominal straining with deep core coordination and with bed mobility. Pt demo'd correct mini squat and reported no pain. Pt had reported it caused pain and she stopped doing them. ? ?Provided external manual Tx to optimize SIJ mobility and lengthen pelvic floor. Provided propioception input externally to help pt notice pelvic floor mobility. Pt continues to benefit from skilled PT. Plan to add more resistance bands for strengthening at next session.  ?   ? ? ? ?   ?  ? Personal Factors and Comorbidities Comorbidity 3+   ? Comorbidities hysterectomy, L kidney cancer  removed 2/2 Kidney cancer 2/ 2019,  A-fib, Fall on to tailbone a year ago. 3 vaginal deliveri

## 2021-03-29 ENCOUNTER — Telehealth: Payer: Self-pay | Admitting: *Deleted

## 2021-03-29 NOTE — Telephone Encounter (Signed)
Incoming call from patient on sch. Line. Call returned by RN. Patient wanted to clarify ct scan apt times and apts with NP in April. Reviewed apt schedule with patient. She thanked me for calling her back. ?

## 2021-03-30 ENCOUNTER — Ambulatory Visit
Admission: RE | Admit: 2021-03-30 | Discharge: 2021-03-30 | Disposition: A | Discharge status: Home / Self Care | Payer: Medicare Other | Source: Ambulatory Visit | Attending: General Surgery | Admitting: General Surgery

## 2021-03-30 ENCOUNTER — Ambulatory Visit: Payer: Self-pay | Admitting: General Surgery

## 2021-03-30 ENCOUNTER — Other Ambulatory Visit: Payer: Self-pay | Admitting: General Surgery

## 2021-03-30 ENCOUNTER — Other Ambulatory Visit: Payer: Self-pay

## 2021-03-30 DIAGNOSIS — K432 Incisional hernia without obstruction or gangrene: Secondary | ICD-10-CM

## 2021-03-30 NOTE — H&P (View-Only) (Signed)
PATIENT PROFILE: ?Heidi Torres is a 66 y.o. adult who presents to the Clinic for consultation at the request of [place], [place] for evaluation of ventral hernia. ? ?PCP:  [name], MD ? ?HISTORY OF PRESENT ILLNESS: ?  Heidi Torres reports she started feeling a lump in the upper portion of her incisions for the last few months.  She endorses that the scar has always been sore but during the last few months she has been feeling a lump that is particularly tender to palpation.  Pain does not radiate to other part of the body.  The first time that she noticed that it was when she was doing some crunches.  Pain is aggravated by applying pressure and doing core exercises.  There has been no alleviating factors.  Patient denies any episode of abdominal distention nausea or vomiting. ? ?Patient has previous open nephrectomy 4 years ago. ? ? ?PROBLEM LIST: ?Problem List  Date Reviewed: 08/13/2020  ? ?       Noted  ? PAF (paroxysmal atrial fibrillation) (CMS-HCC) - followed by Dr. Clayborn Bigness 12/12/2019  ? Anemia of chronic disease (Hgb 10.8 - 07/29/20) 04/22/2019  ? CKD (chronic kidney disease) stage 3, (Cr 1.3, GFR 41 - 08/20/20) 09/25/2018  ? Renal cell carcinoma of left kidney s/p nephrectomy (02/24/17) - followed by Dr. Grayland Ormond 03/21/2017  ? Pulmonary MAI (mycobacterium avium-intracellulare) infection (CMS-HCC) 12/15/2016  ? Mixed hyperlipidemia (LDL 87 - 07/29/20)  Unknown  ? Esophagitis, reflux 10/21/2013  ? Asthma without status asthmaticus, unspecified - previously followed by Dr. Alva Garnet Unknown  ? Essential hypertension with goal blood pressure less than 140/90 Unknown  ? Perennial allergic rhinitis with seasonal variation Unknown  ? ? ?GENERAL REVIEW OF SYSTEMS:  ? ?General ROS: negative for - chills, fatigue, fever, weight gain or weight loss ?Allergy and Immunology ROS: negative for - hives  ?Hematological and Lymphatic ROS: negative for - bleeding problems or bruising, negative for palpable nodes ?Endocrine ROS:  negative for - heat or cold intolerance, hair changes ?Respiratory ROS: negative for - cough, shortness of breath or wheezing ?Cardiovascular ROS: no chest pain or palpitations ?GI ROS: negative for nausea, vomiting, abdominal pain, diarrhea, constipation ?Musculoskeletal ROS: negative for - joint swelling or muscle pain ?Neurological ROS: negative for - confusion, syncope ?Dermatological ROS: negative for pruritus and rash ?Psychiatric: negative for anxiety, depression, difficulty sleeping and memory loss ? ?MEDICATIONS: ?Current Outpatient Medications  ?Medication Sig Dispense Refill  ? albuterol (PROVENTIL) 2.5 mg /3 mL (0.083 %) nebulizer solution Take 2.5 mg by nebulization every 6 (six) hours as needed for Wheezing    ? albuterol 90 mcg/actuation inhaler INHALE 2 PUFFS BY MOUTH EVERY 6 HOURS AS NEEDED FOR WHEEZE 6.7 each 3  ? ALPRAZolam (XANAX) 0.25 MG tablet TAKE 1 TABLET BY MOUTH EVERY DAY AS NEEDED FOR SLEEP 30 tablet 3  ? atorvastatin (LIPITOR) 40 MG tablet TAKE 1 TABLET BY MOUTH EVERY DAY AT NIGHT 90 tablet 1  ? cholecalciferol, vitamin D3, 5,000 unit Tab Take 1 tablet by mouth once daily.    ? doxycycline (VIBRAMYCIN) 50 MG capsule TAKE 1 CAPSULE (50 MG TOTAL) BY MOUTH EVERY EVENING. TAKE WITH FOOD    ? fluticasone propionate (FLOVENT HFA) 110 mcg/actuation inhaler as needed    ? hydrALAZINE (APRESOLINE) 25 MG tablet Take 25 mg by mouth as needed (prn basis for accelerated HTN (bp >180/100); bid prn)    ? magnesium 30 mg tablet Take 30 mg by mouth once daily    ? mepolizumab (  NUCALA) 100 mg/mL auto-injection Inject subcutaneously every 28 (twenty-eight) days    ? metoprolol tartrate (LOPRESSOR) 25 MG tablet Take 0.5 tablets (12.5 mg total) by mouth 2 (two) times daily as needed 90 tablet 3  ? olmesartan (BENICAR) 20 MG tablet TAKE 1 TABLET BY MOUTH EVERY DAY 90 tablet 1  ? potassium chloride (MICRO-K) 10 MEQ ER capsule TAKE 2 CAPSULES (20 MEQ TOTAL) BY MOUTH ONCE DAILY 180 capsule 1  ?  triamterene-hydrochlorothiazide (DYAZIDE) 37.5-25 mg capsule TAKE 1 CAPSULE BY MOUTH EVERY DAY 90 capsule 1  ? XARELTO 20 mg tablet TAKE 1 TABLET BY MOUTH DAILY WITH BREAKFAST 30 tablet 1  ? DUPIXENT SYRINGE 300 mg/2 mL inj syringe Inject 300 mg subcutaneously every 7 (seven) days (Patient not taking: Reported on 03/30/2021)    ? EPINEPHrine (EPIPEN) 0.3 mg/0.3 mL auto-injector See admin instructions. (Patient not taking: Reported on 03/30/2021)    ? zafirlukast (ACCOLATE) 20 MG tablet Take 20 mg by mouth once daily Or singulair   (Patient not taking: Reported on 03/30/2021)    ? ?No current facility-administered medications for this visit.  ? ? ?ALLERGIES: ?Benzoin and Ciprofloxacin ? ?PAST MEDICAL HISTORY: ?Past Medical History:  ?Diagnosis Date  ? A-fib (CMS-HCC)   ? Allergic state 1962  ? Anemia of chronic disease (Hgb 11.6 - 04/16/19) 04/22/2019  ? Arrhythmia 1992  ? Asthma without status asthmaticus, unspecified   ? Cancer (CMS-HCC)   ? Hx of renal cell carcinoma- followed by Dr. Grayland Ormond  ? Cataract cortical, senile   ? Essential hypertension with goal blood pressure less than 140/90   ? GERD (gastroesophageal reflux disease)   ? Heart murmur, unspecified   ? Perennial allergic rhinitis with seasonal variation   ? Pure hypercholesterolemia   ? diet controlled  ? Renal cell carcinoma of left kidney s/p nephrectomy (02/24/17) - followed by Dr. Grayland Ormond 03/21/2017  ? ? ?PAST SURGICAL HISTORY: ?Past Surgical History:  ?Procedure Laterality Date  ? TUBAL LIGATION  05/1991  ? Left radial nephrectomy  02/24/2017  ? EGD  01/09/2019  ? Gastritis/Fundic gland polyp/EOE/ No repeat per TKT.  ? EGD  12/16/2019  ? Gastritis/Intestinal metaplasia/Repeat as needed/CTL  ? DILATION AND CURETTAGE, DIAGNOSTIC / THERAPEUTIC    ? for fibroids  ? FUNCTIONAL ENDOSCOPIC SINUS SURGERY    ? HYSTERECTOMY    ?  ? ?FAMILY HISTORY: ?Family History  ?Problem Relation Age of Onset  ? Asthma Father   ? Skin cancer Father   ? High blood pressure  (Hypertension) Father   ? Allergic rhinitis Father   ? Asthma Sister   ?     so severe, at one time was not expected to live  ? Allergic rhinitis Sister   ? High blood pressure (Hypertension) Sister   ? Glaucoma Mother   ? Stroke Mother   ? Glaucoma Maternal Grandmother   ? Macular degeneration Neg Hx   ? ESRD-Dialysis Neg Hx   ? ESRD-Transplant Neg Hx   ? FSGS Neg Hx   ? Glomerulonephritis Neg Hx   ? Polycystic kidney disease Neg Hx   ?  ? ?SOCIAL HISTORY: ?Social History  ? ?Socioeconomic History  ? Marital status: Married  ? Highest education level: Patient refused  ?Tobacco Use  ? Smoking status: Never  ? Smokeless tobacco: Never  ?Vaping Use  ? Vaping Use: Never used  ?Substance and Sexual Activity  ? Alcohol use: No  ?  Alcohol/week: 0.0 standard drinks  ? Drug use: Never  ?  Sexual activity: Defer  ?Social History Narrative  ? Marital Status- Married  ? Lives with husband and son (is in college at [employer])  ? Employment- Retail buyer at [employer]  ? Exercise hx- Weight-lifting, walks  ? Religious Affiliation- Darrick Meigs  ? ? ?PHYSICAL EXAM: ?Vitals:  ? 03/30/21 1011  ?BP: 112/62  ?Pulse: 60  ? ?Body mass index is 25.82 kg/m?. ?Weight: 72.6 kg (160 lb)  ? ?GENERAL: Alert, active, oriented x3 ? ?HEENT: Pupils equal reactive to light. Extraocular movements are intact. Sclera clear. Palpebral conjunctiva normal red color.Pharynx clear. ? ?NECK: Supple with no palpable mass and no adenopathy. ? ?LUNGS: Sound clear with no rales rhonchi or wheezes. ? ?HEART: Regular rhythm S1 and S2 without murmur. ? ?ABDOMEN: Soft and depressible, nontender with no palpable mass, no hepatomegaly.  There is a palpable lump in the upper portion of the midline scar.  Mildly tender to palpation.  Unable to completely reduce. ? ?EXTREMITIES: Well-developed well-nourished symmetrical with no dependent edema. ? ?NEUROLOGICAL: Awake alert oriented, facial expression symmetrical, moving all extremities. ? ?REVIEW OF  DATA: ?I have reviewed the following data today: ?No visits with results within 3 Month(s) from this visit.  ?Latest known visit with results is:  ?Appointment on 11/25/2020  ?Component Date Value  ? Color 11/25/2020 Yellow   ? Cl

## 2021-03-30 NOTE — H&P (Signed)
PATIENT PROFILE: ?Heidi Torres is a 66 y.o. adult who presents to the Clinic for consultation at the request of [place], [place] for evaluation of ventral hernia. ? ?PCP:  [name], MD ? ?HISTORY OF PRESENT ILLNESS: ?  Mccollister reports she started feeling a lump in the upper portion of her incisions for the last few months.  She endorses that the scar has always been sore but during the last few months she has been feeling a lump that is particularly tender to palpation.  Pain does not radiate to other part of the body.  The first time that she noticed that it was when she was doing some crunches.  Pain is aggravated by applying pressure and doing core exercises.  There has been no alleviating factors.  Patient denies any episode of abdominal distention nausea or vomiting. ? ?Patient has previous open nephrectomy 4 years ago. ? ? ?PROBLEM LIST: ?Problem List  Date Reviewed: 08/13/2020  ? ?       Noted  ? PAF (paroxysmal atrial fibrillation) (CMS-HCC) - followed by Dr. Clayborn Bigness 12/12/2019  ? Anemia of chronic disease (Hgb 10.8 - 07/29/20) 04/22/2019  ? CKD (chronic kidney disease) stage 3, (Cr 1.3, GFR 41 - 08/20/20) 09/25/2018  ? Renal cell carcinoma of left kidney s/p nephrectomy (02/24/17) - followed by Dr. Grayland Ormond 03/21/2017  ? Pulmonary MAI (mycobacterium avium-intracellulare) infection (CMS-HCC) 12/15/2016  ? Mixed hyperlipidemia (LDL 87 - 07/29/20)  Unknown  ? Esophagitis, reflux 10/21/2013  ? Asthma without status asthmaticus, unspecified - previously followed by Dr. Alva Garnet Unknown  ? Essential hypertension with goal blood pressure less than 140/90 Unknown  ? Perennial allergic rhinitis with seasonal variation Unknown  ? ? ?GENERAL REVIEW OF SYSTEMS:  ? ?General ROS: negative for - chills, fatigue, fever, weight gain or weight loss ?Allergy and Immunology ROS: negative for - hives  ?Hematological and Lymphatic ROS: negative for - bleeding problems or bruising, negative for palpable nodes ?Endocrine ROS:  negative for - heat or cold intolerance, hair changes ?Respiratory ROS: negative for - cough, shortness of breath or wheezing ?Cardiovascular ROS: no chest pain or palpitations ?GI ROS: negative for nausea, vomiting, abdominal pain, diarrhea, constipation ?Musculoskeletal ROS: negative for - joint swelling or muscle pain ?Neurological ROS: negative for - confusion, syncope ?Dermatological ROS: negative for pruritus and rash ?Psychiatric: negative for anxiety, depression, difficulty sleeping and memory loss ? ?MEDICATIONS: ?Current Outpatient Medications  ?Medication Sig Dispense Refill  ? albuterol (PROVENTIL) 2.5 mg /3 mL (0.083 %) nebulizer solution Take 2.5 mg by nebulization every 6 (six) hours as needed for Wheezing    ? albuterol 90 mcg/actuation inhaler INHALE 2 PUFFS BY MOUTH EVERY 6 HOURS AS NEEDED FOR WHEEZE 6.7 each 3  ? ALPRAZolam (XANAX) 0.25 MG tablet TAKE 1 TABLET BY MOUTH EVERY DAY AS NEEDED FOR SLEEP 30 tablet 3  ? atorvastatin (LIPITOR) 40 MG tablet TAKE 1 TABLET BY MOUTH EVERY DAY AT NIGHT 90 tablet 1  ? cholecalciferol, vitamin D3, 5,000 unit Tab Take 1 tablet by mouth once daily.    ? doxycycline (VIBRAMYCIN) 50 MG capsule TAKE 1 CAPSULE (50 MG TOTAL) BY MOUTH EVERY EVENING. TAKE WITH FOOD    ? fluticasone propionate (FLOVENT HFA) 110 mcg/actuation inhaler as needed    ? hydrALAZINE (APRESOLINE) 25 MG tablet Take 25 mg by mouth as needed (prn basis for accelerated HTN (bp >180/100); bid prn)    ? magnesium 30 mg tablet Take 30 mg by mouth once daily    ? mepolizumab (  NUCALA) 100 mg/mL auto-injection Inject subcutaneously every 28 (twenty-eight) days    ? metoprolol tartrate (LOPRESSOR) 25 MG tablet Take 0.5 tablets (12.5 mg total) by mouth 2 (two) times daily as needed 90 tablet 3  ? olmesartan (BENICAR) 20 MG tablet TAKE 1 TABLET BY MOUTH EVERY DAY 90 tablet 1  ? potassium chloride (MICRO-K) 10 MEQ ER capsule TAKE 2 CAPSULES (20 MEQ TOTAL) BY MOUTH ONCE DAILY 180 capsule 1  ?  triamterene-hydrochlorothiazide (DYAZIDE) 37.5-25 mg capsule TAKE 1 CAPSULE BY MOUTH EVERY DAY 90 capsule 1  ? XARELTO 20 mg tablet TAKE 1 TABLET BY MOUTH DAILY WITH BREAKFAST 30 tablet 1  ? DUPIXENT SYRINGE 300 mg/2 mL inj syringe Inject 300 mg subcutaneously every 7 (seven) days (Patient not taking: Reported on 03/30/2021)    ? EPINEPHrine (EPIPEN) 0.3 mg/0.3 mL auto-injector See admin instructions. (Patient not taking: Reported on 03/30/2021)    ? zafirlukast (ACCOLATE) 20 MG tablet Take 20 mg by mouth once daily Or singulair   (Patient not taking: Reported on 03/30/2021)    ? ?No current facility-administered medications for this visit.  ? ? ?ALLERGIES: ?Benzoin and Ciprofloxacin ? ?PAST MEDICAL HISTORY: ?Past Medical History:  ?Diagnosis Date  ? A-fib (CMS-HCC)   ? Allergic state 1962  ? Anemia of chronic disease (Hgb 11.6 - 04/16/19) 04/22/2019  ? Arrhythmia 1992  ? Asthma without status asthmaticus, unspecified   ? Cancer (CMS-HCC)   ? Hx of renal cell carcinoma- followed by Dr. Grayland Ormond  ? Cataract cortical, senile   ? Essential hypertension with goal blood pressure less than 140/90   ? GERD (gastroesophageal reflux disease)   ? Heart murmur, unspecified   ? Perennial allergic rhinitis with seasonal variation   ? Pure hypercholesterolemia   ? diet controlled  ? Renal cell carcinoma of left kidney s/p nephrectomy (02/24/17) - followed by Dr. Grayland Ormond 03/21/2017  ? ? ?PAST SURGICAL HISTORY: ?Past Surgical History:  ?Procedure Laterality Date  ? TUBAL LIGATION  05/1991  ? Left radial nephrectomy  02/24/2017  ? EGD  01/09/2019  ? Gastritis/Fundic gland polyp/EOE/ No repeat per [facility].  ? EGD  12/16/2019  ? Gastritis/Intestinal metaplasia/Repeat as needed/CTL  ? DILATION AND CURETTAGE, DIAGNOSTIC / THERAPEUTIC    ? for fibroids  ? FUNCTIONAL ENDOSCOPIC SINUS SURGERY    ? HYSTERECTOMY    ?  ? ?FAMILY HISTORY: ?Family History  ?Problem Relation Age of Onset  ? Asthma Father   ? Skin cancer Father   ? High blood pressure  (Hypertension) Father   ? Allergic rhinitis Father   ? Asthma Sister   ?     so severe, at one time was not expected to live  ? Allergic rhinitis Sister   ? High blood pressure (Hypertension) Sister   ? Glaucoma Mother   ? Stroke Mother   ? Glaucoma Maternal Grandmother   ? Macular degeneration Neg Hx   ? ESRD-Dialysis Neg Hx   ? ESRD-Transplant Neg Hx   ? FSGS Neg Hx   ? Glomerulonephritis Neg Hx   ? Polycystic kidney disease Neg Hx   ?  ? ?SOCIAL HISTORY: ?Social History  ? ?Socioeconomic History  ? Marital status: Married  ? Highest education level: Patient refused  ?Tobacco Use  ? Smoking status: Never  ? Smokeless tobacco: Never  ?Vaping Use  ? Vaping Use: Never used  ?Substance and Sexual Activity  ? Alcohol use: No  ?  Alcohol/week: 0.0 standard drinks  ? Drug use: Never  ?  Sexual activity: Defer  ?Social History Narrative  ? Marital Status- Married  ? Lives with husband and son (is in college at [employer])  ? Employment- Retail buyer at [employer]  ? Exercise hx- Weight-lifting, walks  ? Religious Affiliation- Darrick Meigs  ? ? ?PHYSICAL EXAM: ?Vitals:  ? 03/30/21 1011  ?BP: 112/62  ?Pulse: 60  ? ?Body mass index is 25.82 kg/m?. ?Weight: 72.6 kg (160 lb)  ? ?GENERAL: Alert, active, oriented x3 ? ?HEENT: Pupils equal reactive to light. Extraocular movements are intact. Sclera clear. Palpebral conjunctiva normal red color.Pharynx clear. ? ?NECK: Supple with no palpable mass and no adenopathy. ? ?LUNGS: Sound clear with no rales rhonchi or wheezes. ? ?HEART: Regular rhythm S1 and S2 without murmur. ? ?ABDOMEN: Soft and depressible, nontender with no palpable mass, no hepatomegaly.  There is a palpable lump in the upper portion of the midline scar.  Mildly tender to palpation.  Unable to completely reduce. ? ?EXTREMITIES: Well-developed well-nourished symmetrical with no dependent edema. ? ?NEUROLOGICAL: Awake alert oriented, facial expression symmetrical, moving all extremities. ? ?REVIEW OF  DATA: ?I have reviewed the following data today: ?No visits with results within 3 Month(s) from this visit.  ?Latest known visit with results is:  ?Appointment on 11/25/2020  ?Component Date Value  ? Color 11/25/2020 Yellow   ? Cl

## 2021-04-01 ENCOUNTER — Ambulatory Visit: Payer: Medicare Other

## 2021-04-01 ENCOUNTER — Inpatient Hospital Stay
Admission: RE | Admit: 2021-04-01 | Discharge: 2021-04-01 | Disposition: A | Discharge status: Home / Self Care | Payer: Medicare Other | Source: Ambulatory Visit

## 2021-04-01 NOTE — Patient Instructions (Signed)
Your procedure is scheduled on: 04/09/21 - Friday ?Report to the Registration Desk on the 1st floor of the [facility]. ?To find out your arrival time, please call [phone] between 1PM - 3PM on: 04/08/21 - Thursday ? ?REMEMBER: ?Instructions that are not followed completely may result in serious medical risk, up to and including death; or upon the discretion of your surgeon and anesthesiologist your surgery may need to be rescheduled. ? ?Do not eat food or drink any fluids after midnight the night before surgery.  ?No gum chewing, lozengers or hard candies. ? ?TAKE THESE MEDICATIONS THE MORNING OF SURGERY WITH A SIP OF WATER: ?- metoprolol tartrate (LOPRESSOR) 25 MG  ?- fluticasone (FLOVENT ) ?- atorvastatin (LIPITOR) 40 MG tablet ?- atorvastatin (LIPITOR) 40 MG tablet ? ?Use albuterol (VENTOLIN HFA) 108 (90 Base) MCG/ACT inhaler on the day of surgery and bring to the hospital. ? ?Follow recommendations from Cardiologist, Pulmonologist or PCP regarding stopping Aspirin, Coumadin, Plavix, Eliquis, Pradaxa, Pletal,  ?- Stop taking rivaroxaban (XARELTO) 20 MG TABS tablet beginning 04/07/21, resume taking per doctors order. ? ?One week prior to surgery: ?Stop Anti-inflammatories (NSAIDS) such as Advil, Aleve, Ibuprofen, Motrin, Naproxen, Naprosyn and Aspirin based products such as Excedrin, Goodys Powder, BC Powder. ? ?Stop ANY OVER THE COUNTER supplements until after surgery. ? ?You may take Tylenol if needed for pain up until the day of surgery. ? ?No Alcohol for 24 hours before or after surgery. ? ?No Smoking including e-cigarettes for 24 hours prior to surgery.  ?No chewable tobacco products for at least 6 hours prior to surgery.  ?No nicotine patches on the day of surgery. ? ?Do not use any "recreational" drugs for at least a week prior to your surgery.  ?Please be advised that the combination of cocaine and anesthesia may have negative outcomes, up to and including death. ?If you test positive for cocaine,  your surgery will be cancelled. ? ?On the morning of surgery brush your teeth with toothpaste and water, you may rinse your mouth with mouthwash if you wish. ?Do not swallow any toothpaste or mouthwash. ? ?Use CHG Soap or wipes as directed on instruction sheet. ? ?Do not wear jewelry, make-up, hairpins, clips or nail polish. ? ?Do not wear lotions, powders, or perfumes.  ? ?Do not shave body from the neck down 48 hours prior to surgery just in case you cut yourself which could leave a site for infection.  ?Also, freshly shaved skin may become irritated if using the CHG soap. ? ?Contact lenses, hearing aids and dentures may not be worn into surgery. ? ?Do not bring valuables to the hospital. [facility] is not responsible for any missing/lost belongings or valuables.  ? ?Notify your doctor if there is any change in your medical condition (cold, fever, infection). ? ?Wear comfortable clothing (specific to your surgery type) to the hospital. ? ?After surgery, you can help prevent lung complications by doing breathing exercises.  ?Take deep breaths and cough every 1-2 hours. Your doctor may order a device called an Incentive Spirometer to help you take deep breaths. ?When coughing or sneezing, hold a pillow firmly against your incision with both hands. This is called ?splinting.? Doing this helps protect your incision. It also decreases belly discomfort. ? ?If you are being admitted to the hospital overnight, leave your suitcase in the car. ?After surgery it may be brought to your room. ? ?If you are being discharged the day of surgery, you will not be allowed to  drive home. ?You will need a responsible adult (18 years or older) to drive you home and stay with you that night.  ? ?If you are taking public transportation, you will need to have a responsible adult (18 years or older) with you. ?Please confirm with your physician that it is acceptable to use public transportation.  ? ?Please call the [facility]  Dept. at [phone] if you have any questions about these instructions. ? ?Surgery Visitation Policy: ? ?Patients undergoing a surgery or procedure may have two family members or support persons with them as long as the person is not COVID-19 positive or experiencing its symptoms.  ? ?Inpatient Visitation:   ? ?Visiting hours are 7 a.m. to 8 p.m. ?Up to four visitors are allowed at one time in a patient room, including children. The visitors may rotate out with other people during the day. One designated support person (adult) may remain overnight.  ?

## 2021-04-02 ENCOUNTER — Encounter
Admission: RE | Admit: 2021-04-02 | Discharge: 2021-04-02 | Disposition: A | Discharge status: Home / Self Care | Payer: Medicare Other | Source: Ambulatory Visit | Attending: General Surgery | Admitting: General Surgery

## 2021-04-02 ENCOUNTER — Other Ambulatory Visit: Payer: Self-pay

## 2021-04-02 DIAGNOSIS — Z01818 Encounter for other preprocedural examination: Secondary | ICD-10-CM

## 2021-04-02 DIAGNOSIS — D649 Anemia, unspecified: Secondary | ICD-10-CM

## 2021-04-02 HISTORY — DX: Anemia, unspecified: D64.9

## 2021-04-02 HISTORY — DX: Unspecified osteoarthritis, unspecified site: M19.90

## 2021-04-02 NOTE — Patient Instructions (Signed)
Your procedure is scheduled on:04-09-21 Friday ?Report to the Registration Desk on the 1st floor of the [facility].Then proceed to the 2nd floor Surgery Desk in the [facility] ?To find out your arrival time, please call [phone] between 1PM - 3PM on:04-08-21 Thursday ? ?REMEMBER: ?Instructions that are not followed completely may result in serious medical risk, up to and including death; or upon the discretion of your surgeon and anesthesiologist your surgery may need to be rescheduled. ? ?Do not eat food after midnight the night before surgery.  ?No gum chewing, lozengers or hard candies. ? ?You may however, drink CLEAR liquids up to 2 hours before you are scheduled to arrive for your surgery. Do not drink anything within 2 hours of your scheduled arrival time. ? ?Clear liquids include: ?- water  ?- apple juice without pulp ?- gatorade (not RED colors) ?- black coffee or tea (Do NOT add milk or creamers to the coffee or tea) ?Do NOT drink anything that is not on this list. ? ?TAKE THESE MEDICATIONS THE MORNING OF SURGERY WITH A SIP OF WATER: ?-atorvastatin (LIPITOR) ?-You may take ALPRAZolam Duanne Moron) the morning of surgery if needed ? ?Stop your rivaroxaban Alveda Reasons) as instructed by Dr Windell Moment 2 days prior to surgery-Last dose on 04-06-21 Tuesday ? ?Use your albuterol (PROVENTIL) nebulizer solution the morning of surgery and bring your Albuterol Inhaler to the hospital  ? ?One week prior to surgery: ?Stop Anti-inflammatories (NSAIDS) such as Advil, Aleve, Ibuprofen, Motrin, Naproxen, Naprosyn and Aspirin based products such as Excedrin, Goodys Powder, BC Powder.You may however, continue to take Tylenol if needed for pain up until the day of surgery. ? ?Stop ANY OVER THE COUNTER supplements/vitamins NOW (04-02-21) until after surgery ((VITAMIN D3, magnesium , and zinc gluconate)-Continue your potassium chloride up until the day prior to surgery ? ? ?No Alcohol for 24 hours before or after surgery. ? ?No  Smoking including e-cigarettes for 24 hours prior to surgery.  ?No chewable tobacco products for at least 6 hours prior to surgery.  ?No nicotine patches on the day of surgery. ? ?Do not use any "recreational" drugs for at least a week prior to your surgery.  ?Please be advised that the combination of cocaine and anesthesia may have negative outcomes, up to and including death. ?If you test positive for cocaine, your surgery will be cancelled. ? ?On the morning of surgery brush your teeth with toothpaste and water, you may rinse your mouth with mouthwash if you wish. ?Do not swallow any toothpaste or mouthwash. ? ?Use CHG Soap as directed on instruction sheet. ? ?Do not wear jewelry, make-up, hairpins, clips or nail polish. ? ?Do not wear lotions, powders, or perfumes.  ? ?Do not shave body from the neck down 48 hours prior to surgery just in case you cut yourself which could leave a site for infection.  ?Also, freshly shaved skin may become irritated if using the CHG soap. ? ?Contact lenses, hearing aids and dentures may not be worn into surgery. ? ?Do not bring valuables to the hospital. [facility] is not responsible for any missing/lost belongings or valuables.  ? ?Notify your doctor if there is any change in your medical condition (cold, fever, infection). ? ?Wear comfortable clothing (specific to your surgery type) to the hospital. ? ?After surgery, you can help prevent lung complications by doing breathing exercises.  ?Take deep breaths and cough every 1-2 hours. Your doctor may order a device called an Incentive Spirometer to help you  take deep breaths. ?When coughing or sneezing, hold a pillow firmly against your incision with both hands. This is called ?splinting.? Doing this helps protect your incision. It also decreases belly discomfort. ? ?If you are being admitted to the hospital overnight, leave your suitcase in the car. ?After surgery it may be brought to your room. ? ?If you are being discharged the  day of surgery, you will not be allowed to drive home. ?You will need a responsible adult (18 years or older) to drive you home and stay with you that night.  ? ?If you are taking public transportation, you will need to have a responsible adult (18 years or older) with you. ?Please confirm with your physician that it is acceptable to use public transportation.  ? ?Please call the [facility] Dept. at [phone] if you have any questions about these instructions. ? ?Surgery Visitation Policy: ? ?Patients undergoing a surgery or procedure may have two family members or support persons with them as long as the person is not COVID-19 positive or experiencing its symptoms.  ?

## 2021-04-04 ENCOUNTER — Encounter: Payer: Self-pay | Admitting: General Surgery

## 2021-04-04 NOTE — Progress Notes (Signed)
?Perioperative Services ? ?Pre-Admission/Anesthesia Testing Clinical Review ? ?Date: 04/07/21 ? ?Patient Demographics:  ?Name: Heidi Torres ?DOB:   [birth year] ?MRN:   [id number] ? ?Planned Surgical Procedure(s):  ? ? Case: [id number] Date/Time: 04/09/21 0952  ? Procedure: XI ROBOTIC ASSISTED VENTRAL HERNIA (Abdomen)  ? Anesthesia type: General  ? Pre-op diagnosis: K43.2 Incisional hernia w/o obstruction or gangrene  ? Location: [facility] OR ROOM 04 / [facility] AN[facility]ES[facility]  ? Surgeons: Herbert Pun, MD  ? ?NOTE: Available PAT nursing documentation and vital signs have been reviewed. Clinical nursing staff has updated patient's PMH/PSHx, current medication list, and drug allergies/intolerances to ensure comprehensive history available to assist in medical decision making as it pertains to the aforementioned surgical procedure and anticipated anesthetic course. Extensive review of available clinical information performed. [place] PMH and PSHx updated with any diagnoses/procedures that  may have been inadvertently omitted during her intake with the pre-admission testing department's nursing staff. ? ?Clinical Discussion:  ?Heidi Torres is a 66 y.o. adult who is submitted for pre-surgical anesthesia review and clearance prior to her undergoing the above procedure. Patient has never been a smoker. Pertinent PMH includes: CAD with angina, atrial fibrillation, cardiac murmur, aortic atherosclerosis, HTN, HLD, CKD III, asthma, previous pulmonary MAI infection, GERD (no daily Tx), hiatal hernia, esophageal stricture (s/p dilation), eosinophilic esophagitis, anemia, RCC (s/p LEFT nephrectomy), OA, anxiety (on BZO). ? ?Patient is followed by cardiology Clayborn Bigness, MD). She was last seen in the cardiology clinic on 05/21/2020; notes reviewed.  At the time of her clinic visit, patient doing well overall from a cardiovascular perspective.  She denied any episodes of chest pain, shortness of breath, PND,  orthopnea, significant peripheral edema, vertiginous symptoms, or presyncope/syncope.  Patient with occasional episodes of palpitations and tachycardia precipitated by vomiting and gagging sensation associated with known GI issues. Patient with a past medical history significant for cardiovascular diagnoses.  Of note, complete records regarding patient's cardiovascular history not available for review at time of consult.  Information obtained from patient and notes from patient's local cardiologist. ? ?Patient reported to have undergone diagnostic left heart catheterization in approximately 2003-2004.  Again, results from this procedure not available for review at time of consult.  Per patient and cardiologist, results from the study revealed no obstructive CAD and were "okay". ? ?Cardiac MRI performed on 10/22/2010 revealed mild asymmetric basal septal LVH with no associated regional wall motion abnormalities.  LVEF 55%.  There was normal right ventricular size and function.  Atria were normal.  There was mild mitral valve regurgitation with mild anterior systolic motion of the mitral valve.  There was no evidence of prolapse or other significant valvular disease.  There was no evidence of infarction or infiltrative disease. ? ?Last myocardial perfusion imaging study performed on 04/08/2016 revealed a normal left ventricular systolic function with an EF of 65%.  There was no evidence of stress-induced myocardial ischemia or arrhythmia.  There was mild ventricular dilatation during stress.  Study determined to be borderline. ? ?Last TTE was performed on 05/28/2020 revealing a normal left ventricular systolic function with moderate LVH; LVEF >55%.  There was trivial to mild pan valvular regurgitation.  There was no evidence of a significant transvalvular gradient to suggest mitral or aortic valve stenosis. ? ?Long-term cardiac event monitor study performed on 03/02/2021 revealing a predominant underlying sinus rhythm  with an average heart rate of 62 bpm; range 46-187 bpm.  There were 80 episodes noted, with the fastest lasting 7  beats at a maximum heart rate of 187 bpm, and the longest lasting 16 beats at a rate of 122 bpm.  Cardiologist noted that some of the SVT episodes may be possible atrial tachycardia with variable block ? ?Patient has a history of atrial fibrillation; CHA2DS2-VASc Score = 4 (age, sex, HTN, aortic plaque).  Rate and rhythm maintained on oral metoprolol tartrate.  She is chronically anticoagulated using daily rivaroxaban; compliant with therapy with no evidence or reports of GI bleeding.  Patient mentioned in previous visits that she would like to discontinue her daily beta-blocker therapy and use it only on a as needed basis citing that she did not feel like she needed this medication every day.  Patient was seen in the ED in 05/2020 for atrial fibrillation with RVR.  She was subsequently referred to electrophysiology. Patient was seen by electrophysiology Quentin Ore, MD) on 01/29/2021; notes reviewed. Blood pressure well controlled on currently prescribed beta-blocker, ARB, and diuretic therapies.  She is on a statin for her HLD and further ASCVD prevention.  She is not diabetic. Functional capacity, as defined by DASI, is documented as being >/= 4 METS.  No changes were made to her medication regimen.  Patient to follow-up with outpatient cardiology at defined intervals for ongoing management. ? ?[name] is scheduled for an elective ROBOTIC ASSISTED VENTRAL HERNIA on 04/09/2021 with Dr. Herbert Pun, MD.  Given patient's past medical history significant for cardiovascular diagnoses, presurgical cardiac clearance was sought by primary attending surgeons office and PAT team.  In review of the available clinical notes, it is unclear who patient is seeing for her cardiovascular care at this point. She had expressed wishes to see someone outside of [facility], however again, it is unclear  if this was only for the electrophysiology evaluation. With that being said, presurgical clearances were sought from both Dr. Clayborn Bigness (cardiology) and Dr. Quentin Ore (electrophysiology) in efforts to ensure that everyone is in agreement with this patient proceeding with the elective surgical intervention. Clearances were obtained as follows: ? ?Per cardiology Clayborn Bigness, MD), "this patient is optimized for surgery and may proceed with the planned procedural course with a LOW risk of significant perioperative cardiovascular complications".  ? ?Per electrophysiology, "it appears Dr. Clayborn Bigness is still listed as her general cardiologist and Dr. Markham Jordan. Rayann Heman listed as her EP providers. She is stable from the afib perspective and can hold Xarelto as requested prior to her surgery". ? ?Again, this patient is on daily anticoagulation therapy.  She has been instructed on recommendations from her cardiologist and surgeon for holding her rivaroxaban for 2 days prior to procedure with plans to restart as soon as postoperative bleeding risk felt to be minimized by primary attending surgeon.  The patient is aware that her last dose of rivaroxaban should be on 04/06/2021. Patient denies previous perioperative complications with anesthesia in the past. In review of the available records, it is noted that patient underwent a general anesthetic course here (ASA II) in 12/2019 without documented complications.  ? ? ?  03/09/2021  ?  2:36 PM 03/08/2021  ? 11:03 AM 01/29/2021  ? 11:21 AM  ?Vitals with BMI  ?Height  '5\' 5"'$  5' 5.5"  ?Weight  163 lbs 2 oz 162 lbs 3 oz  ?BMI  27.15 26.57  ?Systolic 292 446 286  ?Diastolic 80 72 64  ?Pulse  68 64  ? ? ?Providers/Specialists:  ? ?NOTE: Primary physician provider listed below. Patient may have been seen by APP or partner within  same practice.  ? ?PROVIDER ROLE / SPECIALTY LAST OV  ?Herbert Pun, MD General Surgery (Surgeon) 03/30/2021  ?[name], MD Primary Care Provider  03/24/2021  ?Katrine Coho, MD Cardiology 05/21/2020  ?Lars Mage, MD Electrophysiology 01/29/2021  ?Delight Hoh, MD Oncology 08/22/2019  ? ?Allergies:  ?Benzoin, Ciprofloxacin, and Steri-strip compo

## 2021-04-05 ENCOUNTER — Telehealth: Payer: Self-pay | Admitting: *Deleted

## 2021-04-05 NOTE — Telephone Encounter (Signed)
Request for pre-operative cardiac clearance ?Received: Yesterday ?Karen Kitchens, NP  P Cv Div Preop Callback ?Request for pre-operative cardiac clearance:  ?   ?NOTE: Please note that patient's primary cardiologist reported to be Dr. Clayborn Bigness at [facility].  With that being said, patient has not been seen since 06/2020. She was referred to EP Quentin Ore, MD) with [facility]; last seen 01/2021.  Unclear if patient is switching to [place] provider or continuing with [facility] provider. Notes indicate that patient did elect to see Dr. Quentin Ore at the recommendation of her GI provider rather than seeing one with [facility]. Let me know if unable to provide clearance at this time. I have also reached out to Dr. Clayborn Bigness for input.  ? ?1. What type of surgery is being performed?  ?ROBOTIC ASSISTED VENTRAL HERNIA  ? ?2. When is this surgery scheduled?  ?04/09/2021  ?   ?3. Type of clearance being requested (medical, pharmacy, both).  ?BOTH  ?   ?4. Are there any medications that need to be held prior to surgery?  ?RIVAROXABAN  ? ?5. Practice name and name of physician performing surgery?  ?Performing surgeon: Dr. Herbert Pun, MD  ?Requesting clearance: Honor Loh, FNP-C    ?   ?6. Anesthesia type (none, local, MAC, general)?  ?GENERAL  ? ?7. What is the office phone and fax number?    ?Phone: [phone]  ?Fax: [phone]  ? ?ATTENTION: Unable to create telephone message as per your standard workflow. Directed by [facility] providers to send requests for cardiac clearance to this pool for appropriate distribution to provider covering pre-operative clearances.  ? ?Honor Loh, MSN, APRN, FNP-C, CEN  ?[place]  ?Peri-operative Services Nurse Practitioner  ?Phone: [phone]  ?04/04/21 3:23 PM   ?

## 2021-04-05 NOTE — Telephone Encounter (Signed)
-----   Message from Karen Kitchens, NP sent at 04/04/2021  3:22 PM EDT ----- ?Regarding: Request for pre-operative cardiac clearance ?Request for pre-operative cardiac clearance: ?? ?NOTE: Please note that patient's primary cardiologist reported to be Dr. Clayborn Bigness at [facility].  With that being said, patient has not been seen since 06/2020. She was referred to EP Quentin Ore, MD) with [facility]; last seen 01/2021.  Unclear if patient is switching to [place] provider or continuing with [facility] provider. Notes indicate that patient did elect to see Dr. Quentin Ore at the recommendation of her GI provider rather than seeing one with [facility]. Let me know if unable to provide clearance at this time. I have also reached out to Dr. Clayborn Bigness for input.  ? ?1. What type of surgery is being performed?  ?ROBOTIC ASSISTED VENTRAL HERNIA ? ?2. When is this surgery scheduled?  ?04/09/2021 ?? ?3. Type of clearance being requested (medical, pharmacy, both). ?BOTH ?  ?4. Are there any medications that need to be held prior to surgery? ?RIVAROXABAN ? ?5. Practice name and name of physician performing surgery?  ?Performing surgeon: Dr. Herbert Pun, MD ?Requesting clearance: Honor Loh, FNP-C   ?? ?6. Anesthesia type (none, local, MAC, general)? ?GENERAL ? ?7. What is the office phone and fax number?   ?Phone: [phone] ?Fax: [phone] ? ?ATTENTION: Unable to create telephone message as per your standard workflow. Directed by [facility] providers to send requests for cardiac clearance to this pool for appropriate distribution to provider covering pre-operative clearances.  ? ?Honor Loh, MSN, APRN, FNP-C, CEN ?[place]  ?Peri-operative Services Nurse Practitioner ?Phone: [phone] ?04/04/21 3:23 PM ? ?

## 2021-04-05 NOTE — Telephone Encounter (Signed)
Clinical pharmacist to review Xarelto 

## 2021-04-06 ENCOUNTER — Ambulatory Visit: Payer: Medicare Other | Admitting: Physician Assistant

## 2021-04-06 ENCOUNTER — Encounter: Payer: Self-pay | Admitting: General Surgery

## 2021-04-06 NOTE — Telephone Encounter (Signed)
Heidi Torres, it appears Dr. Clayborn Bigness is still listed as her general cardiologist and Dr. Selinda Michaels. Rayann Heman listed as her EP doc. Since this is surgical clearance, I think the most appropriate course of option is to have Dr. Clayborn Bigness comment on it. She is stable from the afib perspective and can hold Xarelto for 2 days prior to the surgery and restart as soon as possible afterward.  ?

## 2021-04-06 NOTE — Telephone Encounter (Signed)
Patient with diagnosis of afib on Xarelto for anticoagulation.   ? ?Procedure: ROBOTIC ASSISTED VENTRAL HERNIA  ?Date of procedure: 04/09/21 ? ?CHA2DS2-VASc Score = 3  ? This indicates a 3.2% annual risk of stroke. ?The patient's score is based upon: ?CHF History: 0 ?HTN History: 1 ?Diabetes History: 0 ?Stroke History: 0 ?Vascular Disease History: 0 ?Age Score: 1 ?Gender Score: 1 ?  ?  ? ?CrCl 41.5 ml/min ? ?Per office protocol, patient can hold Xarelto for 2 days prior to procedure.   ? ? ?

## 2021-04-08 ENCOUNTER — Encounter: Payer: Medicare Other | Admitting: Physical Therapy

## 2021-04-08 ENCOUNTER — Inpatient Hospital Stay: Admission: RE | Admit: 2021-04-08 | Payer: Medicare Other | Source: Ambulatory Visit

## 2021-04-08 ENCOUNTER — Encounter
Admission: RE | Admit: 2021-04-08 | Discharge: 2021-04-08 | Disposition: A | Discharge status: Home / Self Care | Payer: Medicare Other | Source: Ambulatory Visit | Attending: General Surgery | Admitting: General Surgery

## 2021-04-08 ENCOUNTER — Telehealth: Payer: Self-pay | Admitting: Oncology

## 2021-04-08 DIAGNOSIS — Z01818 Encounter for other preprocedural examination: Secondary | ICD-10-CM

## 2021-04-08 DIAGNOSIS — Z01812 Encounter for preprocedural laboratory examination: Secondary | ICD-10-CM | POA: Insufficient documentation

## 2021-04-08 DIAGNOSIS — D649 Anemia, unspecified: Secondary | ICD-10-CM | POA: Diagnosis not present

## 2021-04-08 LAB — CBC
HCT: 35.2 % — ABNORMAL LOW (ref 36.0–46.0)
Hemoglobin: 11.7 g/dL — ABNORMAL LOW (ref 12.0–15.0)
MCH: 27.9 pg (ref 26.0–34.0)
MCHC: 33.2 g/dL (ref 30.0–36.0)
MCV: 83.8 fL (ref 80.0–100.0)
Platelets: 264 10*3/uL (ref 150–400)
RBC: 4.2 MIL/uL (ref 3.87–5.11)
RDW: 13.4 % (ref 11.5–15.5)
WBC: 8.5 10*3/uL (ref 4.0–10.5)
nRBC: 0 % (ref 0.0–0.2)

## 2021-04-09 ENCOUNTER — Other Ambulatory Visit: Payer: Self-pay

## 2021-04-09 ENCOUNTER — Ambulatory Visit: Payer: Medicare Other | Admitting: Urgent Care

## 2021-04-09 ENCOUNTER — Encounter: Payer: Self-pay | Admitting: Nurse Practitioner

## 2021-04-09 ENCOUNTER — Encounter
Admission: RE | Disposition: A | Discharge status: Home / Self Care | Payer: Self-pay | Source: Home / Self Care | Attending: General Surgery

## 2021-04-09 ENCOUNTER — Encounter: Payer: Self-pay | Admitting: General Surgery

## 2021-04-09 ENCOUNTER — Ambulatory Visit
Admission: RE | Admit: 2021-04-09 | Discharge: 2021-04-09 | Disposition: A | Discharge status: Home / Self Care | Payer: Medicare Other | Attending: General Surgery | Admitting: General Surgery

## 2021-04-09 DIAGNOSIS — K432 Incisional hernia without obstruction or gangrene: Secondary | ICD-10-CM | POA: Diagnosis present

## 2021-04-09 DIAGNOSIS — E782 Mixed hyperlipidemia: Secondary | ICD-10-CM | POA: Diagnosis not present

## 2021-04-09 DIAGNOSIS — F419 Anxiety disorder, unspecified: Secondary | ICD-10-CM | POA: Diagnosis not present

## 2021-04-09 DIAGNOSIS — Z79620 Long term (current) use of immunosuppressive biologic: Secondary | ICD-10-CM | POA: Insufficient documentation

## 2021-04-09 DIAGNOSIS — I129 Hypertensive chronic kidney disease with stage 1 through stage 4 chronic kidney disease, or unspecified chronic kidney disease: Secondary | ICD-10-CM | POA: Diagnosis not present

## 2021-04-09 DIAGNOSIS — N183 Chronic kidney disease, stage 3 unspecified: Secondary | ICD-10-CM | POA: Insufficient documentation

## 2021-04-09 DIAGNOSIS — I700 Atherosclerosis of aorta: Secondary | ICD-10-CM | POA: Insufficient documentation

## 2021-04-09 DIAGNOSIS — Z905 Acquired absence of kidney: Secondary | ICD-10-CM | POA: Insufficient documentation

## 2021-04-09 DIAGNOSIS — Z79899 Other long term (current) drug therapy: Secondary | ICD-10-CM | POA: Diagnosis not present

## 2021-04-09 DIAGNOSIS — Z85828 Personal history of other malignant neoplasm of skin: Secondary | ICD-10-CM | POA: Diagnosis not present

## 2021-04-09 DIAGNOSIS — I25119 Atherosclerotic heart disease of native coronary artery with unspecified angina pectoris: Secondary | ICD-10-CM | POA: Insufficient documentation

## 2021-04-09 DIAGNOSIS — K430 Incisional hernia with obstruction, without gangrene: Secondary | ICD-10-CM | POA: Insufficient documentation

## 2021-04-09 DIAGNOSIS — J45909 Unspecified asthma, uncomplicated: Secondary | ICD-10-CM | POA: Diagnosis not present

## 2021-04-09 DIAGNOSIS — I10 Essential (primary) hypertension: Secondary | ICD-10-CM | POA: Diagnosis not present

## 2021-04-09 DIAGNOSIS — Z85528 Personal history of other malignant neoplasm of kidney: Secondary | ICD-10-CM | POA: Diagnosis not present

## 2021-04-09 DIAGNOSIS — Z9221 Personal history of antineoplastic chemotherapy: Secondary | ICD-10-CM | POA: Insufficient documentation

## 2021-04-09 HISTORY — DX: Supraventricular tachycardia: I47.1

## 2021-04-09 HISTORY — DX: Long term (current) use of anticoagulants: Z79.01

## 2021-04-09 HISTORY — DX: Other forms of angina pectoris: I20.8

## 2021-04-09 HISTORY — DX: Atherosclerosis of aorta: I70.0

## 2021-04-09 HISTORY — DX: Chronic kidney disease, stage 3 unspecified: N18.30

## 2021-04-09 HISTORY — PX: XI ROBOTIC ASSISTED VENTRAL HERNIA: SHX6789

## 2021-04-09 HISTORY — DX: Other forms of angina pectoris: I20.89

## 2021-04-09 HISTORY — DX: Atherosclerotic heart disease of native coronary artery without angina pectoris: I25.10

## 2021-04-09 HISTORY — DX: Migraine with aura, not intractable, without status migrainosus: G43.109

## 2021-04-09 HISTORY — DX: Supraventricular tachycardia, unspecified: I47.10

## 2021-04-09 HISTORY — DX: Long term (current) use of immunosuppressive biologic: Z79.620

## 2021-04-09 HISTORY — DX: Anxiety disorder, unspecified: F41.9

## 2021-04-09 SURGERY — REPAIR, HERNIA, VENTRAL, ROBOT-ASSISTED
Anesthesia: General | Site: Abdomen

## 2021-04-09 MED ORDER — FAMOTIDINE 20 MG PO TABS: 20.0000 mg | ORAL_TABLET | Freq: Once | ORAL | Status: AC

## 2021-04-09 MED ORDER — EPHEDRINE SULFATE (PRESSORS) 50 MG/ML IJ SOLN
INTRAMUSCULAR | Status: DC | PRN
Start: 2021-04-09 — End: 2021-04-09
  Administered 2021-04-09: 5 mg via INTRAVENOUS
  Administered 2021-04-09 (×3): 10 mg via INTRAVENOUS

## 2021-04-09 MED ORDER — ONDANSETRON HCL 4 MG/2ML IJ SOLN
INTRAMUSCULAR | Status: AC
  Filled 2021-04-09: qty 2

## 2021-04-09 MED ORDER — PROMETHAZINE HCL 25 MG/ML IJ SOLN
6.2500 mg | Freq: Once | INTRAMUSCULAR | Status: AC
  Administered 2021-04-09: 6.25 mg via INTRAVENOUS

## 2021-04-09 MED ORDER — CEFAZOLIN SODIUM-DEXTROSE 2-4 GM/100ML-% IV SOLN
2.0000 g | INTRAVENOUS | Status: AC
  Administered 2021-04-09: 2 g via INTRAVENOUS

## 2021-04-09 MED ORDER — FENTANYL CITRATE (PF) 100 MCG/2ML IJ SOLN
INTRAMUSCULAR | Status: DC | PRN
Start: 2021-04-09 — End: 2021-04-09
  Administered 2021-04-09 (×2): 50 ug via INTRAVENOUS
  Administered 2021-04-09: 100 ug via INTRAVENOUS

## 2021-04-09 MED ORDER — BUPIVACAINE-EPINEPHRINE 0.25% -1:200000 IJ SOLN
INTRAMUSCULAR | Status: DC | PRN
  Administered 2021-04-09: 50 mL via SURGICAL_CAVITY

## 2021-04-09 MED ORDER — FENTANYL CITRATE (PF) 100 MCG/2ML IJ SOLN
INTRAMUSCULAR | Status: AC
  Administered 2021-04-09: 25 ug via INTRAVENOUS
  Filled 2021-04-09: qty 2

## 2021-04-09 MED ORDER — HYDROMORPHONE HCL 2 MG PO TABS: 2.0000 mg | ORAL_TABLET | Freq: Two times a day (BID) | ORAL | 0 refills | Status: AC | PRN

## 2021-04-09 MED ORDER — ACETAMINOPHEN 10 MG/ML IV SOLN
INTRAVENOUS | Status: AC
  Filled 2021-04-09: qty 100

## 2021-04-09 MED ORDER — CHLORHEXIDINE GLUCONATE 0.12 % MT SOLN
15.0000 mL | Freq: Once | OROMUCOSAL | Status: AC
Start: 2021-04-09 — End: 2021-04-09

## 2021-04-09 MED ORDER — 0.9 % SODIUM CHLORIDE (POUR BTL) OPTIME
TOPICAL | Status: DC | PRN
  Administered 2021-04-09: 500 mL

## 2021-04-09 MED ORDER — ONDANSETRON HCL 4 MG/2ML IJ SOLN
4.0000 mg | Freq: Once | INTRAMUSCULAR | Status: AC | PRN
  Administered 2021-04-09: 4 mg via INTRAVENOUS

## 2021-04-09 MED ORDER — SUGAMMADEX SODIUM 200 MG/2ML IV SOLN
INTRAVENOUS | Status: DC | PRN
  Administered 2021-04-09: 150 mg via INTRAVENOUS

## 2021-04-09 MED ORDER — LACTATED RINGERS IV SOLN: INTRAVENOUS | Status: DC

## 2021-04-09 MED ORDER — DEXAMETHASONE SODIUM PHOSPHATE 10 MG/ML IJ SOLN
INTRAMUSCULAR | Status: AC
  Filled 2021-04-09: qty 1

## 2021-04-09 MED ORDER — MIDAZOLAM HCL 2 MG/2ML IJ SOLN
INTRAMUSCULAR | Status: AC
  Filled 2021-04-09: qty 2

## 2021-04-09 MED ORDER — ONDANSETRON HCL 4 MG/2ML IJ SOLN
INTRAMUSCULAR | Status: DC | PRN
  Administered 2021-04-09: 4 mg via INTRAVENOUS

## 2021-04-09 MED ORDER — CEFAZOLIN SODIUM-DEXTROSE 2-4 GM/100ML-% IV SOLN
INTRAVENOUS | Status: AC
  Filled 2021-04-09: qty 100

## 2021-04-09 MED ORDER — HYDROMORPHONE HCL 2 MG PO TABS: 2.0000 mg | ORAL_TABLET | Freq: Two times a day (BID) | ORAL | 0 refills | Status: DC | PRN

## 2021-04-09 MED ORDER — LIDOCAINE HCL (PF) 2 % IJ SOLN
INTRAMUSCULAR | Status: AC
  Filled 2021-04-09: qty 5

## 2021-04-09 MED ORDER — LIDOCAINE HCL (CARDIAC) PF 100 MG/5ML IV SOSY
PREFILLED_SYRINGE | INTRAVENOUS | Status: DC | PRN
  Administered 2021-04-09: 80 mg via INTRAVENOUS

## 2021-04-09 MED ORDER — DEXAMETHASONE SODIUM PHOSPHATE 10 MG/ML IJ SOLN
INTRAMUSCULAR | Status: DC | PRN
  Administered 2021-04-09: 10 mg via INTRAVENOUS

## 2021-04-09 MED ORDER — BUPIVACAINE-EPINEPHRINE (PF) 0.25% -1:200000 IJ SOLN
INTRAMUSCULAR | Status: AC
  Filled 2021-04-09: qty 30

## 2021-04-09 MED ORDER — EPHEDRINE 5 MG/ML INJ
INTRAVENOUS | Status: AC
Start: 2021-04-09 — End: ?
  Filled 2021-04-09: qty 5

## 2021-04-09 MED ORDER — OXYCODONE HCL 5 MG PO TABS: 5.0000 mg | ORAL_TABLET | ORAL | 0 refills | Status: DC | PRN

## 2021-04-09 MED ORDER — PROMETHAZINE HCL 25 MG/ML IJ SOLN
INTRAMUSCULAR | Status: DC
Start: 2021-04-09 — End: 2021-04-09
  Filled 2021-04-09: qty 1

## 2021-04-09 MED ORDER — FENTANYL CITRATE (PF) 250 MCG/5ML IJ SOLN
INTRAMUSCULAR | Status: AC
  Filled 2021-04-09: qty 5

## 2021-04-09 MED ORDER — PROPOFOL 10 MG/ML IV BOLUS
INTRAVENOUS | Status: AC
  Filled 2021-04-09: qty 40

## 2021-04-09 MED ORDER — ORAL CARE MOUTH RINSE: 15.0000 mL | Freq: Once | OROMUCOSAL | Status: AC

## 2021-04-09 MED ORDER — FAMOTIDINE 20 MG PO TABS
ORAL_TABLET | ORAL | Status: AC
  Administered 2021-04-09: 20 mg via ORAL
  Filled 2021-04-09: qty 1

## 2021-04-09 MED ORDER — OXYCODONE HCL 5 MG PO TABS
ORAL_TABLET | ORAL | Status: AC
  Administered 2021-04-09: 5 mg
  Filled 2021-04-09: qty 1

## 2021-04-09 MED ORDER — FENTANYL CITRATE (PF) 100 MCG/2ML IJ SOLN
25.0000 ug | INTRAMUSCULAR | Status: DC | PRN
  Administered 2021-04-09: 25 ug via INTRAVENOUS

## 2021-04-09 MED ORDER — CHLORHEXIDINE GLUCONATE 0.12 % MT SOLN
OROMUCOSAL | Status: AC
  Administered 2021-04-09: 15 mL via OROMUCOSAL
  Filled 2021-04-09: qty 15

## 2021-04-09 MED ORDER — EPHEDRINE 5 MG/ML INJ
INTRAVENOUS | Status: AC
  Filled 2021-04-09: qty 5

## 2021-04-09 MED ORDER — MIDAZOLAM HCL 2 MG/2ML IJ SOLN
INTRAMUSCULAR | Status: DC | PRN
  Administered 2021-04-09: 2 mg via INTRAVENOUS

## 2021-04-09 MED ORDER — ACETAMINOPHEN 10 MG/ML IV SOLN
INTRAVENOUS | Status: DC | PRN
  Administered 2021-04-09: 1000 mg via INTRAVENOUS

## 2021-04-09 MED ORDER — SODIUM CHLORIDE 0.9 % IV SOLN: INTRAVENOUS | Status: DC | PRN

## 2021-04-09 MED ORDER — BUPIVACAINE LIPOSOME 1.3 % IJ SUSP
INTRAMUSCULAR | Status: AC
  Filled 2021-04-09: qty 20

## 2021-04-09 MED ORDER — KETOROLAC TROMETHAMINE 30 MG/ML IJ SOLN
INTRAMUSCULAR | Status: AC
  Filled 2021-04-09: qty 1

## 2021-04-09 MED ORDER — ROCURONIUM BROMIDE 100 MG/10ML IV SOLN
INTRAVENOUS | Status: DC | PRN
  Administered 2021-04-09: 20 mg via INTRAVENOUS
  Administered 2021-04-09: 60 mg via INTRAVENOUS

## 2021-04-09 MED ORDER — PROPOFOL 10 MG/ML IV BOLUS
INTRAVENOUS | Status: DC | PRN
  Administered 2021-04-09: 100 mg via INTRAVENOUS

## 2021-04-09 SURGICAL SUPPLY — 45 items
ADH SKN CLS APL DERMABOND .7 (GAUZE/BANDAGES/DRESSINGS) ×1
BAG INFUSER PRESSURE 100CC (MISCELLANEOUS) IMPLANT
BINDER ABDOMINAL 12 ML 46-62 (SOFTGOODS) ×1 IMPLANT
COVER TIP SHEARS 8 DVNC (MISCELLANEOUS) ×2 IMPLANT
COVER TIP SHEARS 8MM DA VINCI (MISCELLANEOUS) ×2
COVER WAND RF STERILE (DRAPES) ×3 IMPLANT
DERMABOND ADVANCED (GAUZE/BANDAGES/DRESSINGS) ×1
DERMABOND ADVANCED .7 DNX12 (GAUZE/BANDAGES/DRESSINGS) ×2 IMPLANT
DRAPE ARM DVNC X/XI (DISPOSABLE) ×6 IMPLANT
DRAPE COLUMN DVNC XI (DISPOSABLE) ×2 IMPLANT
DRAPE DA VINCI XI ARM (DISPOSABLE) ×6
DRAPE DA VINCI XI COLUMN (DISPOSABLE) ×2
ELECT REM PT RETURN 9FT ADLT (ELECTROSURGICAL) ×2
ELECTRODE REM PT RTRN 9FT ADLT (ELECTROSURGICAL) ×2 IMPLANT
GLOVE SURG ENC MOIS LTX SZ6.5 (GLOVE) ×6 IMPLANT
GLOVE SURG UNDER POLY LF SZ6.5 (GLOVE) ×6 IMPLANT
GOWN STRL REUS W/ TWL LRG LVL3 (GOWN DISPOSABLE) ×6 IMPLANT
GOWN STRL REUS W/TWL LRG LVL3 (GOWN DISPOSABLE) ×6
IRRIGATOR SUCT 8 DISP DVNC XI (IRRIGATION / IRRIGATOR) IMPLANT
IRRIGATOR SUCTION 8MM XI DISP (IRRIGATION / IRRIGATOR)
IV NS 1000ML (IV SOLUTION)
IV NS 1000ML BAXH (IV SOLUTION) IMPLANT
KIT PINK PAD W/HEAD ARE REST (MISCELLANEOUS) ×2
KIT PINK PAD W/HEAD ARM REST (MISCELLANEOUS) ×2 IMPLANT
LABEL OR SOLS (LABEL) ×3 IMPLANT
MANIFOLD NEPTUNE II (INSTRUMENTS) ×3 IMPLANT
MESH VENTRALIGHT ST 4X6IN (Mesh General) ×1 IMPLANT
NDL INSUFFLATION 14GA 120MM (NEEDLE) ×2 IMPLANT
NEEDLE HYPO 22GX1.5 SAFETY (NEEDLE) ×3 IMPLANT
NEEDLE INSUFFLATION 14GA 120MM (NEEDLE) ×2 IMPLANT
OBTURATOR OPTICAL STANDARD 8MM (TROCAR) ×2
OBTURATOR OPTICAL STND 8 DVNC (TROCAR) ×1
OBTURATOR OPTICALSTD 8 DVNC (TROCAR) ×2 IMPLANT
PACK LAP CHOLECYSTECTOMY (MISCELLANEOUS) ×3 IMPLANT
SEAL CANN UNIV 5-8 DVNC XI (MISCELLANEOUS) ×6 IMPLANT
SEAL XI 5MM-8MM UNIVERSAL (MISCELLANEOUS) ×6
SET TUBE SMOKE EVAC HIGH FLOW (TUBING) ×3 IMPLANT
SOLUTION ELECTROLUBE (MISCELLANEOUS) ×3 IMPLANT
SUT MNCRL AB 4-0 PS2 18 (SUTURE) ×3 IMPLANT
SUT STRATAFIX PDS 30 CT-1 (SUTURE) ×4 IMPLANT
SUT V-LOC 90 ABS 3-0 VLT  V-20 (SUTURE) ×6
SUT V-LOC 90 ABS 3-0 VLT V-20 (SUTURE) IMPLANT
TAPE TRANSPORE STRL 2 31045 (GAUZE/BANDAGES/DRESSINGS) ×3 IMPLANT
TRAY FOLEY MTR SLVR 16FR STAT (SET/KITS/TRAYS/PACK) ×1 IMPLANT
WATER STERILE IRR 500ML POUR (IV SOLUTION) ×3 IMPLANT

## 2021-04-09 NOTE — Anesthesia Postprocedure Evaluation (Signed)
Anesthesia Post Note ? ?Patient: Heidi Torres ? ?Procedure(s) Performed: XI ROBOTIC ASSISTED VENTRAL HERNIA (Abdomen) ? ?Patient location during evaluation: PACU ?Anesthesia Type: General ?Level of consciousness: awake and awake and alert ?Pain management: satisfactory to patient ?Vital Signs Assessment: post-procedure vital signs reviewed and stable ?Respiratory status: spontaneous breathing and respiratory function stable ?Cardiovascular status: stable ?Anesthetic complications: no ? ? ?No notable events documented. ? ? ?Last Vitals:  ?Vitals:  ? 04/09/21 1123 04/09/21 1126  ?BP: 133/71   ?Pulse: 66 64  ?Resp: 12 12  ?Temp: (!) 36.1 ?C   ?SpO2: 92% 98%  ?  ?Last Pain:  ?Vitals:  ? 04/09/21 0856  ?TempSrc: Temporal  ?PainSc: 0-No pain  ? ? ?  ?  ?  ?  ?  ?  ? ?VAN STAVEREN,Sharayah Renfrow ? ? ? ? ?

## 2021-04-09 NOTE — Telephone Encounter (Signed)
Pt called to get Ct scan scheduled for 4/19 canceled. She had the scan completed on 3/28 by another provider. Please view results from scan completed on 3/28 in Epic. ?

## 2021-04-09 NOTE — Interval H&P Note (Signed)
History and Physical Interval Note: ? ?04/09/2021 ?8:52 AM ? ?Heidi Torres  has presented today for surgery, with the diagnosis of K43.2 Incisional hernia w/o obstruction or gangrene.  The various methods of treatment have been discussed with the patient and family. After consideration of risks, benefits and other options for treatment, the patient has consented to  Procedure(s): ?[name] (N/A) as a surgical intervention.  The patient's history has been reviewed, patient examined, no change in status, stable for surgery.  I have reviewed the patient's chart and labs.  Questions were answered to the patient's satisfaction.   ? ? ?[name] ? ? ?

## 2021-04-09 NOTE — Discharge Instructions (Addendum)
?  Diet: Resume home heart healthy regular diet.  ? ?Activity: No heavy lifting >20 pounds (children, pets, laundry, garbage) or strenuous activity until follow-up, but light activity and walking are encouraged. Do not drive or drink alcohol if taking narcotic pain medications. ? ?Wound care: May shower with soapy water and pat dry (do not rub incisions), but no baths or submerging incision underwater until follow-up. (no swimming)  ? ?Medications: Resume all home medications. For mild to moderate pain: acetaminophen (Tylenol) or ibuprofen (if no kidney disease). Combining Tylenol with alcohol can substantially increase your risk of causing liver disease. Narcotic pain medications, if prescribed, can be used for severe pain, though may cause nausea, constipation, and drowsiness. If you do not need the narcotic pain medication, you do not need to fill the prescription. ? ?Call office ([phone]) at any time if any questions, worsening pain, fevers/chills, bleeding, drainage from incision site, or other concerns. ? ? ?AMBULATORY SURGERY  ?DISCHARGE INSTRUCTIONS ? ? ?The drugs that you were given will stay in your system until tomorrow so for the next 24 hours you should not: ? ?Drive an automobile ?Make any legal decisions ?Drink any alcoholic beverage ? ? ?You may resume regular meals tomorrow.  Today it is better to start with liquids and gradually work up to solid foods. ? ?You may eat anything you prefer, but it is better to start with liquids, then soup and crackers, and gradually work up to solid foods. ? ? ?Please notify your doctor immediately if you have any unusual bleeding, trouble breathing, redness and pain at the surgery site, drainage, fever, or pain not relieved by medication. ? ? ? ?Additional Instructions: ? ? ? ? ? ? ? ?Please contact your physician with any problems or Same Day Surgery at [phone], Monday through Friday 6 am to 4 pm, or [place] at  Main number at [phone].  ?

## 2021-04-09 NOTE — Anesthesia Preprocedure Evaluation (Signed)
Anesthesia Evaluation  ?Patient identified by MRN, date of birth, ID band ?Patient awake ? ? ? ?Reviewed: ?Allergy & Precautions, NPO status , Patient's Chart, lab work & pertinent test results ? ?Airway ?Mallampati: II ? ?TM Distance: <3 FB ?Neck ROM: Full ? ? ? Dental ? ?(+) Teeth Intact ?  ?Pulmonary ?neg pulmonary ROS, asthma ,  ?  ?Pulmonary exam normal ? ?+ decreased breath sounds ? ? ? ? ? Cardiovascular ?hypertension, Pt. on medications ?+ CAD  ?negative cardio ROS ?Normal cardiovascular exam ?Rhythm:Regular Rate:Normal ? ? ?  ?Neuro/Psych ? Headaches, Anxiety negative neurological ROS ? negative psych ROS  ? GI/Hepatic ?negative GI ROS, Neg liver ROS, hiatal hernia, Medicated,  ?Endo/Other  ?negative endocrine ROS ? Renal/GU ?Renal diseaseS/P l Nephrectomy  ? ?  ?Musculoskeletal ? ? Abdominal ?Normal abdominal exam  (+)   ?Peds ? Hematology ?negative hematology ROS ?(+) Blood dyscrasia, anemia ,   ?Anesthesia Other Findings ?Past Medical History: ?No date: Anemia ?No date: Angina at [facility]) ?No date: Anxiety ?    Comment:  a.) on BZO PRN ?No date: Aortic atherosclerosis ([place]) ?No date: Arthritis ?No date: Asthma ?No date: Atrial fibrillation ([place]) ?    Comment:  a.) CHA2DS2-VASc = 4 (age, sex, HTN, aortic plaque). b.) ?             rate/rhythm maintained on oral metoprolol tartrate;  ?             chronically anticoagulated using rivaroxaban ?No date: CAD (coronary artery disease) ?    Comment:  a.) LHC approx 2003/2004 revealed no obstructive CAD. ?No date: Cataract cortical, senile ?No date: CKD (chronic kidney disease), stage III ([place]) ?    Comment:  a.) s/p LEFT nephrectomy d/t RCC 02/24/2017 ?No date: Eosinophilic esophagitis ?No date: Esophageal stricture ?    Comment:  a.) s/p dilation ?No date: GERD (gastroesophageal reflux disease) ?No date: Heart murmur ?No date: History of hiatal hernia ?02/24/2017: History of left nephrectomy ?    Comment:  a.) d/t to [place]  Dx ?No date: Hx of dysplastic nevus ?    Comment:  multiple sites ?06/22/2015: Hx of squamous cell carcinoma of skin ?    Comment:  R infrascapular, SCC in situ ?No date: Hyperlipidemia ?No date: Hypertension ?No date: Long term (current) use of immunosuppressive biologic ?    Comment:  a.) mepolizumab used for asthma and EOE ?No date: Long term current use of anticoagulant ?    Comment:  a.) rivaroxaban ?No date: Ocular migraine ?11/2016: Pneumonia ?No date: PSVT (paroxysmal supraventricular tachycardia) ([place]) ?    Comment:  a.) Holter 03/02/2021: 80 PSVT episodes; fastest lasting ?             7 beats (max 187 bpm), and the longest lasting 16 beats  ?             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    Comment:  a.) stage III (cT3a, cN0, cM0) clear cell RCC; s/p LEFT  ?             nephrectomy 02/24/2017; could not tolerate adjuvant  ?             chemotherapy (sorafenib), even with dose reduction, due  ?             to GI side effects and effects on thyroid panel. ? ?Past Surgical History: ?No  date: ABLATION ?    Comment:  prior to hysterectomy ?01/20/2017: CYSTOSCOPY/RETROGRADE/URETEROSCOPY; Left ?    Comment:  Procedure: CYSTOSCOPY/RETROGRADE/URETEROSCOPY/  ?             BIOPSY,BLADDER BIOPSY PLACEMENT STENT LEFT URETER;   ?             Surgeon: Festus Aloe, MD;  Location: [facility] ORS;   ?             Service: Urology;  Laterality: Left; ?No date: DILATION AND CURETTAGE OF UTERUS ?12/16/2019: ESOPHAGOGASTRODUODENOSCOPY; N/A ?    Comment:  Procedure: ESOPHAGOGASTRODUODENOSCOPY (EGD);  Surgeon:  ?             Lesly Rubenstein, MD;  Location: [facility] ENDOSCOPY;   ?             Service: Endoscopy;  Laterality: N/A; ?01/09/2019: ESOPHAGOGASTRODUODENOSCOPY (EGD) WITH PROPOFOL; N/A ?    Comment:  Procedure: ESOPHAGOGASTRODUODENOSCOPY (EGD) WITH  ?             PROPOFOL;  Surgeon: Alice Reichert, Benay Pike, MD;  Location:   ?             [facility] ENDOSCOPY;  Service: Gastroenterology;  Laterality:  ?             N/A; ?No date: LEFT HEART CATH AND CORONARY ANGIOGRAPHY; Left ?    Comment:  approximately 2003-2004; notes unavailable for review;  ?             results "ok" per patient. ?No date: NASAL ENDOSCOPY WITH EPISTAXIS CONTROL ?02/24/2017: ROBOTIC ASSITED PARTIAL NEPHRECTOMY; Left ?    Comment:  Procedure: XI ROBOTIC ASSITED LEFT  RADICAL NEPHRECTOMY; ?             Surgeon: Alexis Frock, MD;  Location: [facility]S;   ?             Service: Urology;  Laterality: Left; ?05/14/1991: TUBAL LIGATION; Bilateral ?No date: VAGINAL HYSTERECTOMY ? ?BMI   ? Body Mass Index: 26.22 kg/m?  ?  ? ? Reproductive/Obstetrics ?negative OB ROS ? ?  ? ? ? ? ? ? ? ? ? ? ? ? ? ?  ?  ? ? ? ? ? ? ? ? ?Anesthesia Physical ?Anesthesia Plan ? ?ASA: 2 ? ?Anesthesia Plan: General  ? ?Post-op Pain Management:   ? ?Induction: Intravenous ? ?PONV Risk Score and Plan: 1 and Ondansetron and Dexamethasone ? ?Airway Management Planned: Oral ETT ? ?Additional Equipment:  ? ?Intra-op Plan:  ? ?Post-operative Plan: Extubation in OR ? ?Informed Consent: I have reviewed the patients History and Physical, chart, labs and discussed the procedure including the risks, benefits and alternatives for the proposed anesthesia with the patient or authorized representative who has indicated his/her understanding and acceptance.  ? ? ? ?Dental Advisory Given ? ?Plan Discussed with: CRNA and Surgeon ? ?Anesthesia Plan Comments:   ? ? ? ? ? ? ?Anesthesia Quick Evaluation ? ?

## 2021-04-09 NOTE — Transfer of Care (Signed)
Immediate Anesthesia Transfer of Care Note ? ?Patient: Heidi Torres ? ?Procedure(s) Performed: XI ROBOTIC ASSISTED VENTRAL HERNIA (Abdomen) ? ?Patient Location: PACU ? ?Anesthesia Type:General ? ?Level of Consciousness: drowsy ? ?Airway & Oxygen Therapy: Patient Spontanous Breathing and Patient connected to nasal cannula oxygen ? ?Post-op Assessment: Report given to RN, Post -op Vital signs reviewed and stable and Patient moving all extremities ? ?Post vital signs: Reviewed and stable ? ?Last Vitals:  ?Vitals Value Taken Time  ?BP 133/71 04/09/21 1120  ?Temp    ?Pulse 64 04/09/21 1126  ?Resp 12 04/09/21 1126  ?SpO2 98 % 04/09/21 1126  ?Vitals shown include unvalidated device data. ? ?Last Pain:  ?Vitals:  ? 04/09/21 0856  ?TempSrc: Temporal  ?PainSc: 0-No pain  ?   ? ?  ? ?Complications: No notable events documented. ?

## 2021-04-09 NOTE — Anesthesia Procedure Notes (Signed)
Procedure Name: Intubation ?Date/Time: 04/09/2021 9:31 AM ?Performed by: Esaw Grandchild, CRNA ?Pre-anesthesia Checklist: Patient identified, Emergency Drugs available, Suction available and Patient being monitored ?Patient Re-evaluated:Patient Re-evaluated prior to induction ?Oxygen Delivery Method: Circle system utilized ?Preoxygenation: Pre-oxygenation with 100% oxygen ?Induction Type: IV induction ?Ventilation: Mask ventilation without difficulty ?Laryngoscope Size: Sabra Heck and 2 ?Grade View: Grade I ?Tube type: Oral ?Tube size: 7.0 mm ?Number of attempts: 1 ?Airway Equipment and Method: Stylet, Oral airway, LTA kit utilized and Bite block ?Placement Confirmation: ETT inserted through vocal cords under direct vision, positive ETCO2 and breath sounds checked- equal and bilateral ?Secured at: 21 cm ?Tube secured with: Tape ?Dental Injury: Teeth and Oropharynx as per pre-operative assessment  ? ? ? ? ?

## 2021-04-09 NOTE — Op Note (Signed)
Preoperative diagnosis: Incisional Hernia ? ?Postoperative diagnosis: Incisional incarcerated Hernia ? ?Procedure: Robotic assisted laparoscopic incisional hernia repair with mesh ? ?Anesthesia: General ? ?Surgeon: Dr. Windell Moment ? ?Wound Classification: Clean ? ?Specimen: None ? ?Complications: None ? ?Estimated Blood Loss: 57m ? ?Indications: ?Patient is a 66y.o. adult developed a ventral hernia. This was symptomatic and incarcerated and repair was indicated.  ? ?Findings: ?2 cm epigastric incarcerated incisional hernia and a second incisional hernia on the inferior portion of the scar 5 cm apart ?2. Repair achieved with closure of the anterior fascia at midline and 15 x 10 cm Bard mesh ?3. Adequate hemostasis ? ?Pictures:  ?Page 1: epigastric hernia after content reduced ?Page 2: supraumbilical hernia ?Page 3: Hernia repaired with Stratafix ?Page 4 and 5: Hernia repaired after mesh placed.  ? ? ? ? ? ? ? ? ? ? ? ? ?Description of procedure: The patient was brought to the operating room and general anesthesia was induced. A time-out was completed verifying correct patient, procedure, site, positioning, and implant(s) and/or special equipment prior to beginning this procedure. Antibiotics were administered prior to making the incision. SCDs placed. The anterior abdominal wall was prepped and draped in the standard sterile fashion.  ? ?Palmer's point chosen for entry.  Veress needle placed and abdomen insufflated to 15cm without any dramatic increase in pressure.  Needle removed and optiview technique used to place 857mport at same point.  No injury noted during placement. Exparel was infused in a TAP block. Two additional ports, 57m50m2 along left lateral aspect placed.  Xi robot then docked into place.  Hernia contents noted and reduced with combination of blunt, sharp dissection with scissors and fenestrated forceps.  Hemostasis achieved throughout this portion.  Once all hernia contents reduced, there was  noted to be a hernia as described above.   ? ?Insufflation dropped to 6mm13md transfacial suture with 0 stratafix used to primarily close defect under minimal tension. Bard protected 15 x 10 cm mesh was placed within the abdominal cavity and secured to the abdominal wall centered over the defect using the 0 stratafix previously used to primarily close defect.  The mesh was then circumferentially sutured into the anterior abdominal wall using 2-0 VLock x3.  Any bleeding noted during this portion was no longer actively bleeding by end of securing mesh and tightening the suture.   ? ?Robot was undocked.  Abdomen then desufflated while camera within abdomen to ensure no signs of new bleed prior to removing camera and rest of ports completely. All skin incisions closed with runninrg 4-0 Monocryl in a subcuticular fashion.  All wounds then dressed with Dermabond. ? ?Patient was then successfully awakened and transferred to PACU in stable condition.  At the end of the procedure sponge and instrument counts were correct. ? ?

## 2021-04-09 NOTE — OR Nursing (Signed)
Per Dr. Windell Moment, secure chat, pt may resume xarelto tomorrow.  Added to d/c instructions/med section. ?

## 2021-04-09 NOTE — Progress Notes (Signed)
Printed new AVS with new PO pain medication per Dr. Peyton Najjar.  Informed pt and pt's husband.  Both stated understanding.   ?

## 2021-04-12 ENCOUNTER — Encounter: Payer: Self-pay | Admitting: General Surgery

## 2021-04-16 ENCOUNTER — Ambulatory Visit
Admission: RE | Admit: 2021-04-16 | Discharge: 2021-04-16 | Disposition: A | Discharge status: Home / Self Care | Payer: Medicare Other | Source: Ambulatory Visit | Attending: General Surgery | Admitting: General Surgery

## 2021-04-16 ENCOUNTER — Other Ambulatory Visit: Payer: Self-pay | Admitting: General Surgery

## 2021-04-16 DIAGNOSIS — R1084 Generalized abdominal pain: Secondary | ICD-10-CM

## 2021-04-16 DIAGNOSIS — G8928 Other chronic postprocedural pain: Secondary | ICD-10-CM

## 2021-04-16 DIAGNOSIS — R109 Unspecified abdominal pain: Secondary | ICD-10-CM | POA: Diagnosis present

## 2021-04-19 ENCOUNTER — Ambulatory Visit: Payer: Medicare Other | Admitting: Dermatology

## 2021-04-20 NOTE — Progress Notes (Deleted)
Cardiology Office Note Date:  04/20/2021  Patient ID:  Heidi Torres, DOB 1955/03/24, MRN [id number] PCP:  [name], MD  Cardiologist: Dr. Clayborn Bigness ([facility]) Electrophysiologist: Dr. Rayann Heman >> Dr. Quentin Ore  ***refresh   Chief Complaint: *** 6 week f/u, pre-op for ventral hernia surgery  History of Present Illness: Heidi Torres is a 66 y.o. adult with history of HTN, HLD, RCC (s/p L nephrectomy), CKD (IIIb), GERD, asthma, pulm MAI by hx, AFib  She comes in today to be seen for Dr. Quentin Ore, last seen by him , Referred by Dr. Rayann Heman, Jan 2023 for AFib, consideration for watchman.  She has significant burden of GI symptoms, though minimal AF, totaling 3 episodes in 2022, was tolerating Xarelto. Pt felt GI issues were provoking for her AFib. She was following with GI Given she was tolerating her Xarelto, pursuing watchman not recommended. Did not think advancing management of rare Afib episodes was indicated Planned to monitor to see if she was having AFib in correlation to her GI symptoms. If no Afib recommended to see EP prn.  Monitor noted HR 46 - 187, average 62 bpm. 80 SVT, longest lasting 16 beats at a rate of 122 bpm.  Review of rhythm strip suggests atrial tachycardia. Rare supraventricular and ventricular ectopy. No atrial fibrillation. Patient triggered episodes corresponded to sinus rhythm.  Received pre-op request sent to [place] and Dr. Clayborn Bigness for ventral hernia surgery scheduled for 04/09/21  *** hernia surgery? *** short SVTs, not associated with symptoms *** EP prn *** bleeding, xarelto  RCRI score is one for surgery type, 0.9%   AFib/AAD hx Diagnosis timing is unclear Amiodarone seems briefly Feb 2022   Past Medical History:  Diagnosis Date   Anemia    Angina at rest [facility])    Anxiety    a.) on BZO PRN   Aortic atherosclerosis ([place])    Arthritis    Asthma    Atrial fibrillation ([place])    a.) CHA2DS2-VASc = 4 (age, sex, HTN, aortic plaque). b.)  rate/rhythm maintained on oral metoprolol tartrate; chronically anticoagulated using rivaroxaban   CAD (coronary artery disease)    a.) LHC approx 2003/2004 revealed no obstructive CAD.   Cataract cortical, senile    CKD (chronic kidney disease), stage III ([place])    a.) s/p LEFT nephrectomy d/t RCC 65/03/5463   Eosinophilic esophagitis    Esophageal stricture    a.) s/p dilation   GERD (gastroesophageal reflux disease)    Heart murmur    History of hiatal hernia    History of left nephrectomy 02/24/2017   a.) d/t to [facility] Dx   Hx of dysplastic nevus    multiple sites   Hx of squamous cell carcinoma of skin 06/22/2015   R infrascapular, SCC in situ   Hyperlipidemia    Hypertension    Long term (current) use of immunosuppressive biologic    a.) mepolizumab used for asthma and EOE   Long term current use of anticoagulant    a.) rivaroxaban   Ocular migraine    Pneumonia 11/2016   PSVT (paroxysmal supraventricular tachycardia) ([place])    a.) Holter 03/02/2021: 80 PSVT episodes; fastest lasting 7 beats (max 187 bpm), and the longest lasting 16 beats (max 122 bpm)   Pulmonary infection due to Mycobacterium avium-intracellulare (MAI) ([place]) 12/15/2016   Renal cell cancer, left ([place]) 2019   a.) stage III (cT3a, cN0, cM0) clear cell RCC; s/p LEFT nephrectomy 02/24/2017; could not tolerate adjuvant chemotherapy (sorafenib), even with dose  reduction, due to GI side effects and effects on thyroid panel.    Past Surgical History:  Procedure Laterality Date   ABLATION     prior to hysterectomy   CYSTOSCOPY/RETROGRADE/URETEROSCOPY Left 01/20/2017   Procedure: CYSTOSCOPY/RETROGRADE/URETEROSCOPY/ BIOPSY,BLADDER BIOPSY PLACEMENT STENT LEFT URETER;  Surgeon: Festus Aloe, MD;  Location: [facility] ORS;  Service: Urology;  Laterality: Left;   DILATION AND CURETTAGE OF UTERUS     ESOPHAGOGASTRODUODENOSCOPY N/A 12/16/2019   Procedure: ESOPHAGOGASTRODUODENOSCOPY (EGD);  Surgeon: Lesly Rubenstein, MD;   Location: [facility] ENDOSCOPY;  Service: Endoscopy;  Laterality: N/A;   ESOPHAGOGASTRODUODENOSCOPY (EGD) WITH PROPOFOL N/A 01/09/2019   Procedure: ESOPHAGOGASTRODUODENOSCOPY (EGD) WITH PROPOFOL;  Surgeon: Tol[place], Benay Pike, MD;  Location: [facility] ENDOSCOPY;  Service: Gastroenterology;  Laterality: N/A;   LEFT HEART CATH AND CORONARY ANGIOGRAPHY Left    approximately 2003-2004; notes unavailable for review; results "ok" per patient.   NASAL ENDOSCOPY WITH EPISTAXIS CONTROL     ROBOTIC ASSITED PARTIAL NEPHRECTOMY Left 02/24/2017   Procedure: XI ROBOTIC ASSITED LEFT  RADICAL NEPHRECTOMY;  Surgeon: Alexis Frock, MD;  Location: [facility] ORS;  Service: Urology;  Laterality: Left;   TUBAL LIGATION Bilateral 05/14/1991   VAGINAL HYSTERECTOMY     XI ROBOTIC ASSISTED VENTRAL HERNIA N/A 04/09/2021   Procedure: XI ROBOTIC ASSISTED VENTRAL HERNIA;  Surgeon: Herbert Pun, MD;  Location: [facility] ORS;  Service: General;  Laterality: N/A;    Current Outpatient Medications  Medication Sig Dispense Refill   albuterol (PROVENTIL) (2.5 MG/3ML) 0.083% nebulizer solution Take 2.5 mg by nebulization every 6 (six) hours as needed for wheezing or shortness of breath.     albuterol (VENTOLIN HFA) 108 (90 Base) MCG/ACT inhaler Inhale 2 puffs into the lungs every 4 (four) hours as needed for wheezing or shortness of breath.     ALPRAZolam (XANAX) 0.25 MG tablet Take 0.25 mg by mouth at bedtime as needed for anxiety.  5   atorvastatin (LIPITOR) 40 MG tablet Take 40 mg by mouth every morning.     Cholecalciferol (VITAMIN D3) 1000 units CAPS Take 1,000 Units by mouth daily.     doxycycline (VIBRAMYCIN) 50 MG capsule TAKE 1 CAPSULE (50 MG TOTAL) BY MOUTH EVERY EVENING. TAKE WITH FOOD (Patient taking differently: Take 50 mg by mouth every morning. Take with food) 30 capsule 2   EPINEPHrine 0.3 mg/0.3 mL IJ SOAJ injection Inject 0.3 mg into the muscle as needed for anaphylaxis. 1 each 2   fluticasone (FLOVENT HFA) 110 MCG/ACT inhaler  Inhale 2 puffs twice a day with spacer to help prevent cough and wheeze. Rinse mouth after use. (Patient taking differently: Inhale 2 puffs into the lungs 2 (two) times daily as needed (cough or wheeze). Rinse mouth after use.) 1 each 3   magnesium 30 MG tablet Take 30 mg by mouth daily.     Mepolizumab (NUCALA) 100 MG/ML SOAJ Inject 1 mL (100 mg total) into the skin every 28 (twenty-eight) days. 1 mL 11   metoprolol tartrate (LOPRESSOR) 25 MG tablet Take 12.5 mg by mouth 2 (two) times daily as needed (A-fib).     NONFORMULARY OR COMPOUNDED ITEM Apply 1 application. topically in the morning and at bedtime. Ivermectin, Azelaic Acid, Metronidazole - Rosacea Cream     olmesartan (BENICAR) 20 MG tablet Take 20 mg by mouth every morning.     potassium chloride (MICRO-K) 10 MEQ CR capsule Take 20 mEq by mouth daily.  11   rivaroxaban (XARELTO) 20 MG TABS tablet Take 20 mg by mouth daily with breakfast.  triamterene-hydrochlorothiazide (DYAZIDE) 37.5-25 MG per capsule Take 1 capsule by mouth every morning.     zinc gluconate 50 MG tablet Take 50 mg by mouth daily.     No current facility-administered medications for this visit.    Allergies:   Benzoin, Ciprofloxacin, and Steri-strip compound benzoin [benzoin compound]   Social History:  The patient  reports that she has never smoked. She has never used smokeless tobacco. She reports that she does not drink alcohol and does not use drugs.   Family History:  The patient's family history includes AAA (abdominal aortic aneurysm) in her maternal grandmother; Heart disease in her father and mother; Hyperlipidemia in her mother; Hypertension in her brother, father, and sister; Lung cancer in her maternal grandfather; Squamous cell carcinoma in her father.  ROS:  Please see the history of present illness.    All other systems are reviewed and otherwise negative.   PHYSICAL EXAM:  VS:  LMP  (LMP Unknown)  BMI: There is no height or weight on file to  calculate BMI. Well nourished, well developed, in no acute distress HEENT: normocephalic, atraumatic Neck: no JVD, carotid bruits or masses Cardiac:  *** RRR; no significant murmurs, no rubs, or gallops Lungs:  *** CTA b/l, no wheezing, rhonchi or rales Abd: soft, nontender MS: no deformity or *** atrophy Ext: *** no edema Skin: warm and dry, no rash Neuro:  No gross deficits appreciated Psych: euthymic mood, full affect   EKG:  Done today and reviewed by myself shows  ***  Feb 2023: Monitor HR 46 - 187, average 62 bpm. 80 SVT, longest lasting 16 beats at a rate of 122 bpm.  Review of rhythm strip suggests atrial tachycardia. Rare supraventricular and ventricular ectopy. No atrial fibrillation. Patient triggered episodes corresponded to sinus rhythm.   05/28/2020: TTE INTERPRETATION  NORMAL LEFT VENTRICULAR SYSTOLIC FUNCTION   WITH MODERATE LVH  NORMAL RIGHT VENTRICULAR SYSTOLIC FUNCTION  NO VALVULAR STENOSIS  MILD MR, TR, PR  TRIVIAL AR  EF >55%    04/08/2016: stress myoview  Borderline myocardial perfusion scan no clear evidence of  ischemia , but mild ventricular dilatation during stress ejection fraction  of 65%.  Recommend conservative medical therapy unless symptoms persist or  worsen then would consider an invasive strategy   Recent Labs: 05/21/2020: Magnesium 2.0 09/24/2020: BUN 33; Creatinine, Ser 1.27; Potassium 4.1; Sodium 139 04/08/2021: Hemoglobin 11.7; Platelets 264  No results found for requested labs within last 8760 hours.   CrCl cannot be calculated (Patient's most recent lab result is older than the maximum 21 days allowed.).   Wt Readings from Last 3 Encounters:  04/09/21 160 lb (72.6 kg)  03/08/21 163 lb 2 oz (74 kg)  01/29/21 162 lb 3.2 oz (73.6 kg)     Other studies reviewed: Additional studies/records reviewed today include: summarized above  ASSESSMENT AND PLAN:  Paroxysmal Afib CHA2DS2Vasc is 3 (including gender), on xarelto 2.   SVT  suspect ATach Symptoms on her monitor associated with SR ***  3.   HTN ***  4. Pre-op Low cardiac risk score *** Dr. Clayborn Bigness  Disposition: F/u with ***  Current medicines are reviewed at length with the patient today.  The patient did not have any concerns regarding medicines.  Venetia Night, PA-C 04/20/2021 3:36 PM     [place]  [address] [phone] (office)  [phone] (fax)

## 2021-04-21 ENCOUNTER — Ambulatory Visit: Payer: Medicare Other

## 2021-04-22 ENCOUNTER — Ambulatory Visit: Payer: Medicare Other | Admitting: Physician Assistant

## 2021-04-22 ENCOUNTER — Ambulatory Visit: Payer: Medicare Other | Admitting: Physical Therapy

## 2021-04-23 ENCOUNTER — Ambulatory Visit: Payer: Medicare Other | Admitting: Family

## 2021-04-27 ENCOUNTER — Ambulatory Visit: Payer: Medicare Other | Admitting: Oncology

## 2021-05-06 ENCOUNTER — Encounter: Payer: Medicare Other | Admitting: Physical Therapy

## 2021-05-07 ENCOUNTER — Observation Stay
Admit: 2021-05-07 | Discharge: 2021-05-07 | Disposition: A | Discharge status: Home / Self Care | Payer: Medicare Other | Attending: Cardiology | Admitting: Cardiology

## 2021-05-07 ENCOUNTER — Observation Stay
Admission: EM | Admit: 2021-05-07 | Discharge: 2021-05-08 | Disposition: A | Discharge status: Home / Self Care | Payer: Medicare Other | Attending: Internal Medicine | Admitting: Internal Medicine

## 2021-05-07 ENCOUNTER — Emergency Department: Payer: Medicare Other

## 2021-05-07 DIAGNOSIS — R778 Other specified abnormalities of plasma proteins: Secondary | ICD-10-CM | POA: Diagnosis not present

## 2021-05-07 DIAGNOSIS — I2510 Atherosclerotic heart disease of native coronary artery without angina pectoris: Secondary | ICD-10-CM | POA: Insufficient documentation

## 2021-05-07 DIAGNOSIS — I129 Hypertensive chronic kidney disease with stage 1 through stage 4 chronic kidney disease, or unspecified chronic kidney disease: Secondary | ICD-10-CM | POA: Diagnosis not present

## 2021-05-07 DIAGNOSIS — I4891 Unspecified atrial fibrillation: Secondary | ICD-10-CM | POA: Diagnosis not present

## 2021-05-07 DIAGNOSIS — J45909 Unspecified asthma, uncomplicated: Secondary | ICD-10-CM | POA: Diagnosis present

## 2021-05-07 DIAGNOSIS — Z85528 Personal history of other malignant neoplasm of kidney: Secondary | ICD-10-CM | POA: Insufficient documentation

## 2021-05-07 DIAGNOSIS — I10 Essential (primary) hypertension: Secondary | ICD-10-CM | POA: Diagnosis present

## 2021-05-07 DIAGNOSIS — J4520 Mild intermittent asthma, uncomplicated: Secondary | ICD-10-CM

## 2021-05-07 DIAGNOSIS — N1831 Chronic kidney disease, stage 3a: Secondary | ICD-10-CM | POA: Insufficient documentation

## 2021-05-07 DIAGNOSIS — Z7901 Long term (current) use of anticoagulants: Secondary | ICD-10-CM | POA: Diagnosis not present

## 2021-05-07 DIAGNOSIS — Z85828 Personal history of other malignant neoplasm of skin: Secondary | ICD-10-CM | POA: Insufficient documentation

## 2021-05-07 DIAGNOSIS — I480 Paroxysmal atrial fibrillation: Secondary | ICD-10-CM | POA: Diagnosis not present

## 2021-05-07 DIAGNOSIS — R079 Chest pain, unspecified: Secondary | ICD-10-CM | POA: Diagnosis present

## 2021-05-07 DIAGNOSIS — Z79899 Other long term (current) drug therapy: Secondary | ICD-10-CM | POA: Insufficient documentation

## 2021-05-07 DIAGNOSIS — F419 Anxiety disorder, unspecified: Secondary | ICD-10-CM | POA: Diagnosis present

## 2021-05-07 DIAGNOSIS — L719 Rosacea, unspecified: Secondary | ICD-10-CM | POA: Diagnosis present

## 2021-05-07 DIAGNOSIS — E785 Hyperlipidemia, unspecified: Secondary | ICD-10-CM | POA: Diagnosis present

## 2021-05-07 LAB — ECHOCARDIOGRAM COMPLETE
AR max vel: 2.31 cm2
AV Area VTI: 2.41 cm2
AV Area mean vel: 2.23 cm2
AV Mean grad: 12 mmHg
AV Peak grad: 20.6 mmHg
Ao pk vel: 2.27 m/s
Area-P 1/2: 2.04 cm2
Height: 66 in
MV VTI: 1.95 cm2
S' Lateral: 2.11 cm
Weight: 2512 oz

## 2021-05-07 LAB — TROPONIN I (HIGH SENSITIVITY)
Troponin I (High Sensitivity): 35 ng/L — ABNORMAL HIGH (ref <18)
Troponin I (High Sensitivity): 63 ng/L — ABNORMAL HIGH (ref <18)
Troponin I (High Sensitivity): 73 ng/L — ABNORMAL HIGH (ref <18)
Troponin I (High Sensitivity): 103 ng/L (ref <18)
Troponin I (High Sensitivity): 110 ng/L (ref <18)
Troponin I (High Sensitivity): 80 ng/L — ABNORMAL HIGH (ref <18)

## 2021-05-07 LAB — CBC WITH DIFFERENTIAL/PLATELET
Abs Immature Granulocytes: 0.04 10*3/uL (ref 0.00–0.07)
Basophils Absolute: 0.1 10*3/uL (ref 0.0–0.1)
Basophils Relative: 1 %
Eosinophils Absolute: 0.1 10*3/uL (ref 0.0–0.5)
Eosinophils Relative: 2 %
HCT: 35.3 % — ABNORMAL LOW (ref 36.0–46.0)
Hemoglobin: 11.2 g/dL — ABNORMAL LOW (ref 12.0–15.0)
Immature Granulocytes: 1 %
Lymphocytes Relative: 28 %
Lymphs Abs: 2.5 10*3/uL (ref 0.7–4.0)
MCH: 27.2 pg (ref 26.0–34.0)
MCHC: 31.7 g/dL (ref 30.0–36.0)
MCV: 85.7 fL (ref 80.0–100.0)
Monocytes Absolute: 0.6 10*3/uL (ref 0.1–1.0)
Monocytes Relative: 7 %
Neutro Abs: 5.5 10*3/uL (ref 1.7–7.7)
Neutrophils Relative %: 61 %
Platelets: 296 10*3/uL (ref 150–400)
RBC: 4.12 MIL/uL (ref 3.87–5.11)
RDW: 13 % (ref 11.5–15.5)
WBC: 8.8 10*3/uL (ref 4.0–10.5)
nRBC: 0 % (ref 0.0–0.2)

## 2021-05-07 LAB — COMPREHENSIVE METABOLIC PANEL
ALT: 31 U/L (ref 0–44)
AST: 28 U/L (ref 15–41)
Albumin: 4.2 g/dL (ref 3.5–5.0)
Alkaline Phosphatase: 73 U/L (ref 38–126)
Anion gap: 10 (ref 5–15)
BUN: 38 mg/dL — ABNORMAL HIGH (ref 8–23)
CO2: 24 mmol/L (ref 22–32)
Calcium: 9.3 mg/dL (ref 8.9–10.3)
Chloride: 99 mmol/L (ref 98–111)
Creatinine, Ser: 1.35 mg/dL — ABNORMAL HIGH (ref 0.44–1.00)
GFR, Estimated: 43 mL/min — ABNORMAL LOW (ref >60)
Glucose, Bld: 118 mg/dL — ABNORMAL HIGH (ref 70–99)
Potassium: 3.6 mmol/L (ref 3.5–5.1)
Sodium: 133 mmol/L — ABNORMAL LOW (ref 135–145)
Total Bilirubin: 0.4 mg/dL (ref 0.3–1.2)
Total Protein: 7.3 g/dL (ref 6.5–8.1)

## 2021-05-07 LAB — MAGNESIUM: Magnesium: 2.2 mg/dL (ref 1.7–2.4)

## 2021-05-07 MED ORDER — ACETAMINOPHEN 325 MG PO TABS
650.0000 mg | ORAL_TABLET | Freq: Four times a day (QID) | ORAL | Status: DC | PRN
Start: 2021-05-07 — End: 2021-05-08

## 2021-05-07 MED ORDER — DILTIAZEM HCL-DEXTROSE 125-5 MG/125ML-% IV SOLN (PREMIX): 5.0000 mg/h | INTRAVENOUS | Status: DC

## 2021-05-07 MED ORDER — ONDANSETRON HCL 4 MG/2ML IJ SOLN
4.0000 mg | Freq: Three times a day (TID) | INTRAMUSCULAR | Status: DC | PRN
Start: 2021-05-07 — End: 2021-05-08

## 2021-05-07 MED ORDER — IRBESARTAN 150 MG PO TABS
150.0000 mg | ORAL_TABLET | Freq: Every day | ORAL | Status: DC
Start: 2021-05-07 — End: 2021-05-08
  Administered 2021-05-07 – 2021-05-08 (×2): 150 mg via ORAL
  Filled 2021-05-07 (×3): qty 1

## 2021-05-07 MED ORDER — DM-GUAIFENESIN ER 30-600 MG PO TB12
1.0000 | ORAL_TABLET | Freq: Two times a day (BID) | ORAL | Status: DC | PRN
Start: 2021-05-07 — End: 2021-05-08

## 2021-05-07 MED ORDER — NITROGLYCERIN 0.4 MG SL SUBL: 0.4000 mg | SUBLINGUAL_TABLET | SUBLINGUAL | Status: DC | PRN

## 2021-05-07 MED ORDER — DILTIAZEM HCL 25 MG/5ML IV SOLN
20.0000 mg | Freq: Once | INTRAVENOUS | Status: AC
  Administered 2021-05-07: 20 mg via INTRAVENOUS
  Filled 2021-05-07: qty 5

## 2021-05-07 MED ORDER — VITAMIN D 25 MCG (1000 UNIT) PO TABS
1000.0000 [IU] | ORAL_TABLET | Freq: Every day | ORAL | Status: DC
  Administered 2021-05-07 – 2021-05-08 (×2): 1000 [IU] via ORAL
  Filled 2021-05-07 (×2): qty 1

## 2021-05-07 MED ORDER — ASPIRIN 81 MG PO CHEW
324.0000 mg | CHEWABLE_TABLET | Freq: Once | ORAL | Status: AC
  Administered 2021-05-07: 324 mg via ORAL
  Filled 2021-05-07: qty 4

## 2021-05-07 MED ORDER — ATORVASTATIN CALCIUM 20 MG PO TABS
40.0000 mg | ORAL_TABLET | ORAL | Status: DC
Start: 2021-05-07 — End: 2021-05-08
  Administered 2021-05-07 – 2021-05-08 (×2): 40 mg via ORAL
  Filled 2021-05-07 (×2): qty 2

## 2021-05-07 MED ORDER — TRIAMTERENE-HCTZ 37.5-25 MG PO TABS
1.0000 | ORAL_TABLET | Freq: Every day | ORAL | Status: DC
  Administered 2021-05-07: 1 via ORAL
  Filled 2021-05-07 (×2): qty 1

## 2021-05-07 MED ORDER — METOPROLOL TARTRATE 5 MG/5ML IV SOLN: 2.5000 mg | INTRAVENOUS | Status: DC | PRN

## 2021-05-07 MED ORDER — ALPRAZOLAM 0.25 MG PO TABS: 0.2500 mg | ORAL_TABLET | Freq: Every evening | ORAL | Status: DC | PRN

## 2021-05-07 MED ORDER — SODIUM CHLORIDE 0.9 % IV BOLUS
1000.0000 mL | Freq: Once | INTRAVENOUS | Status: AC
  Administered 2021-05-07: 1000 mL via INTRAVENOUS

## 2021-05-07 MED ORDER — RIVAROXABAN 15 MG PO TABS: 15.0000 mg | ORAL_TABLET | Freq: Every day | ORAL | Status: DC

## 2021-05-07 MED ORDER — MAGNESIUM OXIDE -MG SUPPLEMENT 400 (240 MG) MG PO TABS
200.0000 mg | ORAL_TABLET | Freq: Every day | ORAL | Status: DC
  Administered 2021-05-08: 200 mg via ORAL
  Filled 2021-05-07 (×2): qty 1

## 2021-05-07 MED ORDER — RIVAROXABAN 15 MG PO TABS
15.0000 mg | ORAL_TABLET | Freq: Every day | ORAL | Status: DC
  Administered 2021-05-07: 15 mg via ORAL
  Filled 2021-05-07 (×2): qty 1

## 2021-05-07 MED ORDER — ZINC SULFATE 220 (50 ZN) MG PO CAPS
220.0000 mg | ORAL_CAPSULE | Freq: Every day | ORAL | Status: DC
  Administered 2021-05-08: 220 mg via ORAL
  Filled 2021-05-07 (×2): qty 1

## 2021-05-07 MED ORDER — DOXYCYCLINE HYCLATE 50 MG PO CAPS
50.0000 mg | ORAL_CAPSULE | ORAL | Status: DC
  Filled 2021-05-07 (×2): qty 1

## 2021-05-07 MED ORDER — ALBUTEROL SULFATE (2.5 MG/3ML) 0.083% IN NEBU: 3.0000 mL | INHALATION_SOLUTION | RESPIRATORY_TRACT | Status: DC | PRN

## 2021-05-07 MED ORDER — HYDRALAZINE HCL 20 MG/ML IJ SOLN: 5.0000 mg | INTRAMUSCULAR | Status: DC | PRN

## 2021-05-07 MED ORDER — ALUM & MAG HYDROXIDE-SIMETH 200-200-20 MG/5ML PO SUSP
30.0000 mL | Freq: Once | ORAL | Status: AC
Start: 2021-05-07 — End: 2021-05-07
  Administered 2021-05-07: 30 mL via ORAL
  Filled 2021-05-07: qty 30

## 2021-05-07 MED ORDER — DILTIAZEM HCL 25 MG/5ML IV SOLN
15.0000 mg | Freq: Once | INTRAVENOUS | Status: AC
Start: 2021-05-07 — End: 2021-05-07
  Administered 2021-05-07: 15 mg via INTRAVENOUS
  Filled 2021-05-07: qty 5

## 2021-05-07 NOTE — ED Notes (Signed)
Pt eating lunch talking on the phone.  ?

## 2021-05-07 NOTE — ED Provider Notes (Addendum)
? ?[facility] ?Provider Note ? ? ? None  ?  (approximate) ? ? ?History  ? ?Chest Pain ? ? ?HPI ? ?Heidi Torres is a 66 y.o. adult past medical history of atrial fibrillation on rivaroxaban, coronary disease, CKD, RCC s/p left nephrectomy who presents with palpitations.  Patient notes that around 61 PM today she started having burning sensation in her chest with radiation to the left shoulder and belching and felt like she went into A-fib.  Tells me that she has a history of similar when she has GI upset and belching and nausea has gone into A-fib in the past.  Describes pressure-like as well as burning in the center of her chest but again she thinks that this is GI related.  She is on Xarelto she has diltiazem and metoprolol only as needed.  She took 30 mg of diltiazem which was recommended when she called on-call cardiology but heart rate continued to be high.  She denies shortness of breath.  No abdominal pain.  She has missed several doses of the Xarelto in the last 2 weeks.  Did take it tonight. ?  ? ?Past Medical History:  ?Diagnosis Date  ? Anemia   ? Angina at rest [facility])   ? Anxiety   ? a.) on BZO PRN  ? Aortic atherosclerosis ([place])   ? Arthritis   ? Asthma   ? Atrial fibrillation ([place])   ? a.) CHA2DS2-VASc = 4 (age, sex, HTN, aortic plaque). b.) rate/rhythm maintained on oral metoprolol tartrate; chronically anticoagulated using rivaroxaban  ? CAD (coronary artery disease)   ? a.) LHC approx 2003/2004 revealed no obstructive CAD.  ? Cataract cortical, senile   ? CKD (chronic kidney disease), stage III ([place])   ? a.) s/p LEFT nephrectomy d/t RCC 02/24/2017  ? Eosinophilic esophagitis   ? Esophageal stricture   ? a.) s/p dilation  ? GERD (gastroesophageal reflux disease)   ? Heart murmur   ? History of hiatal hernia   ? History of left nephrectomy 02/24/2017  ? a.) d/t to [facility] Dx  ? Hx of dysplastic nevus   ? multiple sites  ? Hx of squamous cell carcinoma of skin 06/22/2015  ? R  infrascapular, SCC in situ  ? Hyperlipidemia   ? Hypertension   ? Long term (current) use of immunosuppressive biologic   ? a.) mepolizumab used for asthma and EOE  ? Long term current use of anticoagulant   ? a.) rivaroxaban  ? Ocular migraine   ? Pneumonia 11/2016  ? PSVT (paroxysmal supraventricular tachycardia) ([place])   ? a.) Holter 03/02/2021: 80 PSVT episodes; fastest lasting 7 beats (max 187 bpm), and the longest lasting 16 beats (max 122 bpm)  ? Pulmonary infection due to Mycobacterium avium-intracellulare (MAI) ([place]) 12/15/2016  ? Renal cell cancer, left ([place]) 2019  ? a.) stage III (cT3a, cN0, cM0) clear cell RCC; s/p LEFT nephrectomy 02/24/2017; could not tolerate adjuvant chemotherapy (sorafenib), even with dose reduction, due to GI side effects and effects on thyroid panel.  ? ? ?Patient Active Problem List  ? Diagnosis Date Noted  ? Chest pain of uncertain etiology 99/24/2683  ? Paroxysmal atrial fibrillation ([place]) 05/13/2020  ? Secondary hypercoagulable state ([place]) 05/13/2020  ? Atrial fibrillation with RVR ([place]) 02/12/2020  ? Mixed hyperlipidemia 01/28/2019  ? Stage 3 chronic kidney disease ([place]) 09/25/2018  ? Moderate persistent asthma 09/05/2018  ? Eosinophilia 09/05/2018  ? Essential hypertension 07/27/2018  ? Perennial allergic rhinitis with seasonal  variation 07/27/2018  ? Pure hypercholesterolemia 07/27/2018  ? Chemotherapy induced nausea and vomiting 07/01/2017  ? Chemotherapy induced diarrhea 07/01/2017  ? Acute renal insufficiency 07/01/2017  ? Thyroid dysfunction 07/01/2017  ? Renal cell carcinoma of left kidney ([place]) 03/21/2017  ? Cancer of left kidney ([place]) 02/24/2017  ? Esophagitis, reflux 10/21/2013  ? ? ? ?Physical Exam  ?Triage Vital Signs: ?ED Triage Vitals [05/07/21 0058]  ?Enc Vitals Group  ?   BP (!) 150/89  ?   Pulse Rate (!) 124  ?   Resp 18  ?   Temp 97.7 ?F (36.5 ?C)  ?   Temp Source Oral  ?   SpO2 98 %  ?   Weight 157 lb (71.2 kg)  ?   Height '5\' 6"'$  (1.676 m)  ?   Head  Circumference   ?   Peak Flow   ?   Pain Score   ?   Pain Loc   ?   Pain Edu?   ?   Excl. in [place]?   ? ? ?Most recent vital signs: ?Vitals:  ? 05/07/21 0600 05/07/21 0630  ?BP: (!) 102/54 115/64  ?Pulse: (!) 51 (!) 55  ?Resp: 13 14  ?Temp:    ?SpO2: 98% 97%  ? ? ? ?General: Awake, no distress.  ?CV:  Good peripheral perfusion.  No peripheral edema, tachycardic with irregular rhythm ?Resp:  Normal effort.  ?Abd:  No distention.  ?Neuro:             Awake, Alert, Oriented x 3  ?Other:   ? ? ?ED Results / Procedures / Treatments  ?Labs ?(all labs ordered are listed, but only abnormal results are displayed) ?Labs Reviewed  ?CBC WITH DIFFERENTIAL/PLATELET - Abnormal; Notable for the following components:  ?    Result Value  ? Hemoglobin 11.2 (*)   ? HCT 35.3 (*)   ? All other components within normal limits  ?COMPREHENSIVE METABOLIC PANEL - Abnormal; Notable for the following components:  ? Sodium 133 (*)   ? Glucose, Bld 118 (*)   ? BUN 38 (*)   ? Creatinine, Ser 1.35 (*)   ? GFR, Estimated 43 (*)   ? All other components within normal limits  ?TROPONIN I (HIGH SENSITIVITY) - Abnormal; Notable for the following components:  ? Troponin I (High Sensitivity) 35 (*)   ? All other components within normal limits  ?TROPONIN I (HIGH SENSITIVITY) - Abnormal; Notable for the following components:  ? Troponin I (High Sensitivity) 63 (*)   ? All other components within normal limits  ?TROPONIN I (HIGH SENSITIVITY) - Abnormal; Notable for the following components:  ? Troponin I (High Sensitivity) 73 (*)   ? All other components within normal limits  ? ? ? ?EKG ? ?EKG interpreted by myself shows atrial fibrillation with RVR left axis deviation, lateral T wave inversions ? ? ?RADIOLOGY ?I reviewed the CXR which does not show any acute cardiopulmonary process; agree with radiology report  ? ? ? ?PROCEDURES: ? ?Critical Care performed: Yes, see critical care procedure note(s) ? ?.1-3 Lead EKG Interpretation ?Performed by: Rada Hay, MD ?Authorized by: Rada Hay, MD  ? ?  Interpretation: abnormal   ?  ECG rate assessment: tachycardic   ?  Rhythm: atrial fibrillation   ?  Ectopy: none   ?  Conduction: normal   ?.Critical Care ?Performed by: Rada Hay, MD ?Authorized by: Rada Hay, MD  ? ?Critical care provider statement:  ?  Critical care time (minutes):  60 ?  Critical care was time spent personally by me on the following activities:  Development of treatment plan with patient or surrogate, discussions with consultants, evaluation of patient's response to treatment, examination of patient, ordering and review of laboratory studies, ordering and review of radiographic studies, ordering and performing treatments and interventions, pulse oximetry, re-evaluation of patient's condition and review of old charts ? ?The patient is on the cardiac monitor to evaluate for evidence of arrhythmia and/or significant heart rate changes. ? ? ?MEDICATIONS ORDERED IN ED: ?Medications  ?diltiazem (CARDIZEM) injection 15 mg (15 mg Intravenous Given 05/07/21 0149)  ?diltiazem (CARDIZEM) injection 20 mg (20 mg Intravenous Given 05/07/21 0253)  ?sodium chloride 0.9 % bolus 1,000 mL (0 mLs Intravenous Stopped 05/07/21 0400)  ?alum & mag hydroxide-simeth (MAALOX/MYLANTA) 200-200-20 MG/5ML suspension 30 mL (30 mLs Oral Given 05/07/21 0439)  ?aspirin chewable tablet 324 mg (324 mg Oral Given 05/07/21 0437)  ? ? ? ?IMPRESSION / MDM / ASSESSMENT AND PLAN / ED COURSE  ?I reviewed the triage vital signs and the nursing notes. ?             ?               ? ?Differential diagnosis includes, but is not limited to, ACS, paroxysmal A-fib ? ?Patient is a 66 year old female with history of paroxysmal A-fib often brought on in the setting of GI distress with belching and burning sensation in her chest who present with palpitations.  Around 11 PM patient had acute onset of belching burning sensation and palpitations in her chest.  Says this is similar to prior  episodes she has had of A-fib with RVR but has not had this in over a year.  She has mild dyspnea overall feels quite fatigued.  She took 30 mg of immediate acting Dilt which did not help her heart rate so she came

## 2021-05-07 NOTE — Assessment & Plan Note (Signed)
Stable.  Baseline creatinine 1.2-1.3.  Her creatinine is 1.35, BUN 38 ?-Follow-up by BMP ?

## 2021-05-07 NOTE — Assessment & Plan Note (Signed)
See assessment and plan under chest pain ?

## 2021-05-07 NOTE — Assessment & Plan Note (Addendum)
-  prn xanax ?

## 2021-05-07 NOTE — Assessment & Plan Note (Addendum)
Chest pain, elevated troponin, history of CAD: Troponin level 35, 73, 73, 103.  Her chest pain is resolved currently. ?-Trend troponin ?To check A1c, FLP ?-Patient received 324 mg of aspirin ?-Lipitor ?-Patient is on Xarelto ?-As needed nitroglycerin ?

## 2021-05-07 NOTE — H&P (Signed)
?History and Physical  ? ? ?Heidi Torres QPY:[id number] DOB: May 06, 1955 DOA: 05/07/2021 ? ?Referring MD/NP/PA:  ? ?PCP: [name], MD  ? ?Patient coming from:  The patient is coming from home.  At baseline, pt is independent for most of ADL.       ? ?Chief Complaint: chest pain and palpitation ? ?HPI: Heidi Torres is a 66 y.o. adult with medical history significant of atrial fibrillation on Xarelto, hypertension, hyperlipidemia, asthma, PSVT, RCC (s/p of left nephrectomy), CAD, anemia, CKD stage IIIa, ventral hernia repair, Rosacea on chronic doxycycline, who presents with chest pain and palpitation. ? ?Patient states that she started having chest pain and palpitation at about 11 PM last night.  The chest pain is located in substernal area, pressure and burning-like pain, initially severe, currently subsided, radiating to bilateral necks.  Denies shortness breath, cough, fever or chills.  Patient also reports nausea and belching. She states that she has similar experience in the past, saying that when she has GI upset and belching she has gone into A-fib in the past.  No diarrhea or abdominal pain.  No symptoms of UTI.  Patient states that she checked her heart rate at home which was 158. She took 30 mg of diltiazem which was recommended when she called on-call cardiology, but heart rate continued to be high. She states that she has diltiazem and metoprolol only as needed at home. ? ?Patient was found to have atrial fibrillation with RVR with heart rate up to 140s in ED.  Converted to sinus rhythm after given 2 dose of Cardizem in ED. ? ?Data Reviewed and ED Course: pt was found to have Trop 35 --> 73 --> 73 --> 103.  WBC 8.8, stable renal function, temperature normal, blood pressure 94/83, 150/89, 117/61, current heart rate 50s, RR 18, oxygen saturation 95-99% on room air.  Chest x-ray negative.  Patient is placed on telemetry bed for position, Dr. Corky Sox of card is consulted. ? ? ?EKG: I have  personally reviewed.  Atrial fibrillation, QTc 530, LAD, poor R wave progression.  The repeated EKG after given 2 dose of Cardizem showed sinus rhythm, QTc 458. ? ? ?Review of Systems:  ? ?General: no fevers, chills, no body weight gain, has fatigue ?HEENT: no blurry vision, hearing changes or sore throat ?Respiratory: no dyspnea, coughing, wheezing ?CV: has chest pain, palpitations ?GI: has nausea, no vomiting, abdominal pain, diarrhea, constipation ?GU: no dysuria, burning on urination, increased urinary frequency, hematuria  ?Ext: no leg edema ?Neuro: no unilateral weakness, numbness, or tingling, no vision change or hearing loss ?Skin: no rash, no skin tear. ?MSK: No muscle spasm, no deformity, no limitation of range of movement in spin ?Heme: No easy bruising.  ?Travel history: No recent long distant travel. ? ? ?Allergy:  ?Allergies  ?Allergen Reactions  ? Benzoin   ?  Other reaction(s): Other (See Comments) ?Blisters  ? Ciprofloxacin Other (See Comments)  ?  Arms were numb  ? Dupilumab Other (See Comments)  ?  Painful to use and lump/ring at injection   ? Steri-Strip Compound Benzoin [Benzoin Compound] Other (See Comments)  ?  Blisters  ? ? ?Past Medical History:  ?Diagnosis Date  ? Anemia   ? Angina at rest [facility])   ? Anxiety   ? a.) on BZO PRN  ? Aortic atherosclerosis ([place])   ? Arthritis   ? Asthma   ? Atrial fibrillation ([place])   ? a.) CHA2DS2-VASc = 4 (age, sex, HTN, aortic  plaque). b.) rate/rhythm maintained on oral metoprolol tartrate; chronically anticoagulated using rivaroxaban  ? CAD (coronary artery disease)   ? a.) LHC approx 2003/2004 revealed no obstructive CAD.  ? Cataract cortical, senile   ? CKD (chronic kidney disease), stage III ([place])   ? a.) s/p LEFT nephrectomy d/t RCC 02/24/2017  ? Eosinophilic esophagitis   ? Esophageal stricture   ? a.) s/p dilation  ? GERD (gastroesophageal reflux disease)   ? Heart murmur   ? History of hiatal hernia   ? History of left nephrectomy 02/24/2017  ? a.)  d/t to [facility] Dx  ? Hx of dysplastic nevus   ? multiple sites  ? Hx of squamous cell carcinoma of skin 06/22/2015  ? R infrascapular, SCC in situ  ? Hyperlipidemia   ? Hypertension   ? Long term (current) use of immunosuppressive biologic   ? a.) mepolizumab used for asthma and EOE  ? Long term current use of anticoagulant   ? a.) rivaroxaban  ? Ocular migraine   ? Pneumonia 11/2016  ? PSVT (paroxysmal supraventricular tachycardia) ([place])   ? a.) Holter 03/02/2021: 80 PSVT episodes; fastest lasting 7 beats (max 187 bpm), and the longest lasting 16 beats (max 122 bpm)  ? Pulmonary infection due to Mycobacterium avium-intracellulare (MAI) ([place]) 12/15/2016  ? Renal cell cancer, left ([place]) 2019  ? a.) stage III (cT3a, cN0, cM0) clear cell RCC; s/p LEFT nephrectomy 02/24/2017; could not tolerate adjuvant chemotherapy (sorafenib), even with dose reduction, due to GI side effects and effects on thyroid panel.  ? ? ?Past Surgical History:  ?Procedure Laterality Date  ? ABLATION    ? prior to hysterectomy  ? CYSTOSCOPY/RETROGRADE/URETEROSCOPY Left 01/20/2017  ? Procedure: CYSTOSCOPY/RETROGRADE/URETEROSCOPY/ BIOPSY,BLADDER BIOPSY PLACEMENT STENT LEFT URETER;  Surgeon: Festus Aloe, MD;  Location: [facility] ORS;  Service: Urology;  Laterality: Left;  ? DILATION AND CURETTAGE OF UTERUS    ? ESOPHAGOGASTRODUODENOSCOPY N/A 12/16/2019  ? Procedure: ESOPHAGOGASTRODUODENOSCOPY (EGD);  Surgeon: Lesly Rubenstein, MD;  Location: [facility] ENDOSCOPY;  Service: Endoscopy;  Laterality: N/A;  ? ESOPHAGOGASTRODUODENOSCOPY (EGD) WITH PROPOFOL N/A 01/09/2019  ? Procedure: ESOPHAGOGASTRODUODENOSCOPY (EGD) WITH PROPOFOL;  Surgeon: Toledo, Benay Pike, MD;  Location: [facility] ENDOSCOPY;  Service: Gastroenterology;  Laterality: N/A;  ? LEFT HEART CATH AND CORONARY ANGIOGRAPHY Left   ? approximately 2003-2004; notes unavailable for review; results "ok" per patient.  ? NASAL ENDOSCOPY WITH EPISTAXIS CONTROL    ? ROBOTIC ASSITED PARTIAL NEPHRECTOMY Left  02/24/2017  ? Procedure: XI ROBOTIC ASSITED LEFT  RADICAL NEPHRECTOMY;  Surgeon: Alexis Frock, MD;  Location: [facility] ORS;  Service: Urology;  Laterality: Left;  ? TUBAL LIGATION Bilateral 05/14/1991  ? VAGINAL HYSTERECTOMY    ? XI ROBOTIC ASSISTED VENTRAL HERNIA N/A 04/09/2021  ? Procedure: XI ROBOTIC ASSISTED VENTRAL HERNIA;  Surgeon: Herbert Pun, MD;  Location: [facility] ORS;  Service: General;  Laterality: N/A;  ? ? ?Social History:  reports that she has never smoked. She has never used smokeless tobacco. She reports that she does not drink alcohol and does not use drugs. ? ?Family History:  ?Family History  ?Problem Relation Age of Onset  ? Heart disease Mother   ? Hyperlipidemia Mother   ? Squamous cell carcinoma Father   ? Heart disease Father   ? Hypertension Father   ? Hypertension Sister   ? Hypertension Brother   ? AAA (abdominal aortic aneurysm) Maternal Grandmother   ? Lung cancer Maternal Grandfather   ?     smoker  ? Colon cancer  Neg Hx   ? Esophageal cancer Neg Hx   ? Stomach cancer Neg Hx   ?  ? ?Prior to Admission medications   ?Medication Sig Start Date End Date Taking? Authorizing Provider  ?albuterol (PROVENTIL) (2.5 MG/3ML) 0.083% nebulizer solution Take 2.5 mg by nebulization every 6 (six) hours as needed for wheezing or shortness of breath.    [provider]  ?albuterol (VENTOLIN HFA) 108 (90 Base) MCG/ACT inhaler Inhale 2 puffs into the lungs every 4 (four) hours as needed for wheezing or shortness of breath.    [provider]  ?ALPRAZolam Duanne Moron) 0.25 MG tablet Take 0.25 mg by mouth at bedtime as needed for anxiety. 08/11/14   [provider]  ?atorvastatin (LIPITOR) 40 MG tablet Take 40 mg by mouth every morning. 09/26/18   [provider]  ?Cholecalciferol (VITAMIN D3) 1000 units CAPS Take 1,000 Units by mouth daily.    [provider]  ?doxycycline (VIBRAMYCIN) 50 MG capsule TAKE 1 CAPSULE (50 MG TOTAL) BY MOUTH EVERY EVENING. TAKE WITH  FOOD ?Patient taking differently: Take 50 mg by mouth every morning. Take with food 03/01/21   Ralene Bathe, MD  ?EPINEPHrine 0.3 mg/0.3 mL IJ SOAJ injection Inject 0.3 mg into the muscle as needed for anaphylaxis. 03/08/21

## 2021-05-07 NOTE — ED Notes (Signed)
Report received from [employer], pt moved to room 32 pending hospital room availability. Pt with no complaints at this time, walked to bathroom without assist. Pt hoping to be discharged this am.  ?

## 2021-05-07 NOTE — Consult Note (Signed)
[facility] Cardiology ? ?CARDIOLOGY CONSULT NOTE  ?Patient ID: ?Heidi Torres ?MRN: [id number] ?DOB/AGE: 1955/02/23 66 y.o. ? ?Admit date: 05/07/2021 ?Referring Physician Ivor Costa ?Primary Physician [name], MD ?Primary Cardiologist [place] ?Reason for Consultation AF with RVR, elevated troponin ? ?HPI:  ?Heidi Torres plus Heidi Torres is a 66 year old female with history of paroxysmal atrial fibrillation, hypertension, hyperlipidemia, RCC s/p left nephrectomy 2019 who presents with palpitations and chest pain.  She was discovered to be in A-fib with RVR and troponin is mildly elevated.  She is now in normal sinus rhythm. ? ?She has a history of atrial fibrillation with RVR for which she has been seen in the emergency department, and previously had been on amiodarone as well as diltiazem for rate control.  Prior to her current presentation, she has 3 documented episodes of A-fib with RVR, and was established with Lars Mage for consideration of ablation, though they decided that since her burden was low that they would defer for now.  Most recently she is only prescribed diltiazem and metoprolol as needed for episodes of A-fib, but both of these prescriptions have expired states she has not used them in so long.  She is maintained on Xarelto for stroke risk reduction. ? ?Yesterday evening she developed palpitations and chest discomfort as well as a sensation of belching consistent with her prior episodes of atrial fibrillation with RVR.  Ultimately she presented to the emergency department where she was discovered to be in A-fib with RVR with rate in the 140s, was given 2 doses of IV diltiazem with restoration of normal sinus rhythm.  Her initial troponin was 35 and is increased to 100 over several repeats. ? ?At the time of my evaluation she is feeling well and has no chest pain or shortness of breath.  ? ?Review of systems complete and found to be negative unless listed above  ? ? ? ?Past Medical History:  ?Diagnosis  Date  ? Anemia   ? Angina at rest [facility])   ? Anxiety   ? a.) on BZO PRN  ? Aortic atherosclerosis ([place])   ? Arthritis   ? Asthma   ? Atrial fibrillation ([place])   ? a.) CHA2DS2-VASc = 4 (age, sex, HTN, aortic plaque). b.) rate/rhythm maintained on oral metoprolol tartrate; chronically anticoagulated using rivaroxaban  ? CAD (coronary artery disease)   ? a.) LHC approx 2003/2004 revealed no obstructive CAD.  ? Cataract cortical, senile   ? CKD (chronic kidney disease), stage III ([place])   ? a.) s/p LEFT nephrectomy d/t RCC 02/24/2017  ? Eosinophilic esophagitis   ? Esophageal stricture   ? a.) s/p dilation  ? GERD (gastroesophageal reflux disease)   ? Heart murmur   ? History of hiatal hernia   ? History of left nephrectomy 02/24/2017  ? a.) d/t to [facility] Dx  ? Hx of dysplastic nevus   ? multiple sites  ? Hx of squamous cell carcinoma of skin 06/22/2015  ? R infrascapular, SCC in situ  ? Hyperlipidemia   ? Hypertension   ? Long term (current) use of immunosuppressive biologic   ? a.) mepolizumab used for asthma and EOE  ? Long term current use of anticoagulant   ? a.) rivaroxaban  ? Ocular migraine   ? Pneumonia 11/2016  ? PSVT (paroxysmal supraventricular tachycardia) ([place])   ? a.) Holter 03/02/2021: 80 PSVT episodes; fastest lasting 7 beats (max 187 bpm), and the longest lasting 16 beats (max 122 bpm)  ? Pulmonary infection due to Mycobacterium  avium-intracellulare (MAI) ([place]) 12/15/2016  ? Renal cell cancer, left ([place]) 2019  ? a.) stage III (cT3a, cN0, cM0) clear cell RCC; s/p LEFT nephrectomy 02/24/2017; could not tolerate adjuvant chemotherapy (sorafenib), even with dose reduction, due to GI side effects and effects on thyroid panel.  ?  ?Past Surgical History:  ?Procedure Laterality Date  ? ABLATION    ? prior to hysterectomy  ? CYSTOSCOPY/RETROGRADE/URETEROSCOPY Left 01/20/2017  ? Procedure: CYSTOSCOPY/RETROGRADE/URETEROSCOPY/ BIOPSY,BLADDER BIOPSY PLACEMENT STENT LEFT URETER;  Surgeon: Festus Aloe, MD;   [facility];  Service: Urology;  Laterality: Left;  ? DILATION AND CURETTAGE OF UTERUS    ? ESOPHAGOGASTRODUODENOSCOPY N/A 12/16/2019  ? Procedure: ESOPHAGOGASTRODUODENOSCOPY (EGD);  Surgeon: Lesly Rubenstein, MD;  Location: [facility] ENDOSCOPY;  Service: Endoscopy;  Laterality: N/A;  ? ESOPHAGOGASTRODUODENOSCOPY (EGD) WITH PROPOFOL N/A 01/09/2019  ? Procedure: ESOPHAGOGASTRODUODENOSCOPY (EGD) WITH PROPOFOL;  Surgeon: Toledo, Benay Pike, MD;  Location: [facility] ENDOSCOPY;  Service: Gastroenterology;  Laterality: N/A;  ? LEFT HEART CATH AND CORONARY ANGIOGRAPHY Left   ? approximately 2003-2004; notes unavailable for review; results "ok" per patient.  ? NASAL ENDOSCOPY WITH EPISTAXIS CONTROL    ? ROBOTIC ASSITED PARTIAL NEPHRECTOMY Left 02/24/2017  ? Procedure: XI ROBOTIC ASSITED LEFT  RADICAL NEPHRECTOMY;  Surgeon: Alexis Frock, MD;  Location: [facility];  Service: Urology;  Laterality: Left;  ? TUBAL LIGATION Bilateral 05/14/1991  ? VAGINAL HYSTERECTOMY    ? XI ROBOTIC ASSISTED VENTRAL HERNIA N/A 04/09/2021  ? Procedure: XI ROBOTIC ASSISTED VENTRAL HERNIA;  Surgeon: Herbert Pun, MD;  Location: [facility] ORS;  Service: General;  Laterality: N/A;  ?  ?(Not in a hospital admission) ? ?Social History  ? ?Socioeconomic History  ? Marital status: Married  ?  Spouse name: Not on file  ? Number of children: 3  ? Years of education: Not on file  ? Highest education level: Not on file  ?Occupational History  ? Occupation: Pharmacist, hospital  ?Tobacco Use  ? Smoking status: Never  ? Smokeless tobacco: Never  ?Vaping Use  ? Vaping Use: Never used  ?Substance and Sexual Activity  ? Alcohol use: No  ? Drug use: Never  ? Sexual activity: Yes  ?Other Topics Concern  ? Not on file  ?Social History Narrative  ? 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  No alcohol no caffeine no drug use or other tobacco.  ? ?Social Determinants of Health  ? ?Financial Resource Strain: Not on file  ?Food Insecurity: Not on  file  ?Transportation Needs: Not on file  ?Physical Activity: Not on file  ?Stress: Not on file  ?Social Connections: Not on file  ?Intimate Partner Violence: Not on file  ?  ?Family History  ?Problem Relation Age of Onset  ? Heart disease Mother   ? Hyperlipidemia Mother   ? Squamous cell carcinoma Father   ? Heart disease Father   ? Hypertension Father   ? Hypertension Sister   ? Hypertension Brother   ? AAA (abdominal aortic aneurysm) Maternal Grandmother   ? Lung cancer Maternal Grandfather   ?     smoker  ? Colon cancer Neg Hx   ? Esophageal cancer Neg Hx   ? Stomach cancer Neg Hx   ?  ? ? ?Review of systems complete and found to be negative unless listed above  ? ? ? ? ?PHYSICAL EXAM ? ?General: Well developed, well nourished, in no acute distress ?HEENT:  Normocephalic and atramatic ?Neck:  No JVD.  ?Lungs: Clear  bilaterally to auscultation and percussion. ?Heart: HRRR . Normal S1 and S2 without gallops or murmurs.  ?Abdomen: Bowel sounds are positive, abdomen soft and non-tender  ?Msk:  Back normal, normal gait. Normal strength and tone for age. ?Extremities: No clubbing, cyanosis or edema.   ?Neuro: Alert and oriented X 3. ?Psych:  Good affect, responds appropriately ? ?Labs: ?  ?Lab Results  ?Component Value Date  ? WBC 8.8 05/07/2021  ? HGB 11.2 (L) 05/07/2021  ? HCT 35.3 (L) 05/07/2021  ? MCV 85.7 05/07/2021  ? PLT 296 05/07/2021  ?  ?Recent Labs  ?Lab 05/07/21 ?0111  ?NA 133*  ?K 3.6  ?CL 99  ?CO2 24  ?BUN 38*  ?CREATININE 1.35*  ?CALCIUM 9.3  ?PROT 7.3  ?BILITOT 0.4  ?ALKPHOS 73  ?ALT 31  ?AST 28  ?GLUCOSE 118*  ? ?Lab Results  ?Component Value Date  ? TROPONINI <0.03 10/14/2016  ? No results found for: CHOL ?No results found for: HDL ?No results found for: [place] ?No results found for: TRIG ?No results found for: CHOLHDL ?No results found for: LDLDIRECT  ?  ?Radiology: CT ABDOMEN PELVIS WO CONTRAST ? ?Result Date: 04/16/2021 ?CLINICAL DATA:  Incisional hernia repair on 04/09/2021. Pain all over the  abdomen since. EXAM: CT ABDOMEN AND PELVIS WITHOUT CONTRAST TECHNIQUE: Multidetector CT imaging of the abdomen and pelvis was performed following the standard protocol without IV contrast. RADIATION DOSE REDUCTIO

## 2021-05-07 NOTE — Assessment & Plan Note (Signed)
-   IV hydralazine as needed ?-Continue home Dyazide ?-Switch Benicar to irbesartan in hospital ?

## 2021-05-07 NOTE — ED Notes (Signed)
Pt ambulated to toilet and back to bed. 

## 2021-05-07 NOTE — Assessment & Plan Note (Signed)
Lipitor 

## 2021-05-07 NOTE — ED Notes (Signed)
Report received from Matthew, RN  

## 2021-05-07 NOTE — Assessment & Plan Note (Signed)
Stable.  Patient is getting Nucala injection q. 28 days, last dose was on 5/3 ?-As needed bronchodilators ?

## 2021-05-07 NOTE — Assessment & Plan Note (Signed)
-   Continue home doxycycline ?

## 2021-05-07 NOTE — ED Notes (Signed)
NO change in condition, will continue to monitor.  ?

## 2021-05-07 NOTE — ED Triage Notes (Signed)
66 y/o female arrived to the [facility] via POV with a CC of chest pain and rapid heart rate. PT reports burning in the center of her chest going to her left shoulder. Pt reports nausea but no throwing up. Pt has a history of a fib and takes Cardizem in the past for it. Pt notes taking Xarelto for her afib ?

## 2021-05-07 NOTE — ED Notes (Signed)
RN to bedside to introduce self to pt. Pt is CAOx4 and in no acute distress. Pt is asymptomatic but bradycardic at this time.  ?

## 2021-05-07 NOTE — Assessment & Plan Note (Addendum)
HR is up to 140s in ED, converted to sinus rhythm after given 2 dose of Cardizem (15 and 20 mg) in the ED.  Patient had chest pain which has resolved currently.  Troponin is elevated, 35, 73, 73, 103.  Dr. Corky Sox of cardiology is consulted. ? ?-Place in telemetry bed for observation ?-As needed metoprolol 2.5 mg for heart rate> 125. ?-2D echo per card ?

## 2021-05-07 NOTE — ED Notes (Signed)
Hospitalist in to eval  ?

## 2021-05-07 NOTE — ED Notes (Signed)
Echo completed at bedside

## 2021-05-07 NOTE — Progress Notes (Signed)
*  PRELIMINARY RESULTS* ?Echocardiogram ?2D Echocardiogram has been performed. ? ?Heidi Torres ?05/07/2021, 1:18 PM ?

## 2021-05-07 NOTE — ED Notes (Signed)
Pt eating lunch tray  

## 2021-05-07 NOTE — ED Notes (Signed)
PT eating meal tray ?

## 2021-05-07 NOTE — ED Notes (Signed)
Critical trop called to Dr. Blaine Hamper.  ?

## 2021-05-08 DIAGNOSIS — I480 Paroxysmal atrial fibrillation: Secondary | ICD-10-CM | POA: Diagnosis not present

## 2021-05-08 DIAGNOSIS — I4891 Unspecified atrial fibrillation: Secondary | ICD-10-CM | POA: Diagnosis not present

## 2021-05-08 MED ORDER — DILTIAZEM HCL 30 MG PO TABS: 30.0000 mg | ORAL_TABLET | ORAL | 11 refills | Status: DC | PRN

## 2021-05-08 NOTE — Discharge Summary (Signed)
?Physician Discharge Summary ?  ?Patient: Heidi Torres MRN: [id number] DOB: [birth year]?/?/??  ?Admit date:     05/07/2021  ?Discharge date: 05/08/21  ?Discharge Physician: Geradine Girt  ? ?PCP: [name], MD  ? ?Recommendations at discharge:  ? ?Close cardiology follow up ? ?Discharge Diagnoses: ?Principal Problem: ?  Atrial fibrillation with RVR ([place]) ?Active Problems: ?  Chest pain ?  CAD (coronary artery disease) ?  Elevated troponin ?  Essential hypertension ?  Chronic kidney disease, stage 3a ([place]) ?  Asthma ?  Anxiety ?  HLD (hyperlipidemia) ?  Rosacea ? ? ? ?Assessment and Plan: ?Paroxysmal atrial fibrillation with RVR ?-PRN cardiazem ?-on xarelto ? ?Elevated troponin due to demand ischemia ?-outpatient work up: CTA as previously ordered.  Consider cardiac MRI for evaluation of probable HCM. ? ?Consultants: cards ? ?Disposition: Home ?Diet recommendation:  ?Discharge Diet Orders (From admission, onward)  ? ?  Start     Ordered  ? 05/08/21 0000  Diet - low sodium heart healthy       ? 05/08/21 1015  ? ?  ?  ? ?  ? ?Cardiac diet ?DISCHARGE MEDICATION: ?Allergies as of 05/08/2021   ? ?   Reactions  ? Benzoin   ? Other reaction(s): Other (See Comments) ?Blisters  ? Ciprofloxacin Other (See Comments)  ? Arms were numb  ? Dupilumab Other (See Comments)  ? Painful to use and lump/ring at injection   ? Steri-strip Compound Benzoin [benzoin Compound] Other (See Comments)  ? Blisters  ? ?  ? ?  ?Medication List  ?  ? ?STOP taking these medications   ? ?metoprolol tartrate 25 MG tablet ?Commonly known as: LOPRESSOR ?  ? ?  ? ?TAKE these medications   ? ?albuterol 108 (90 Base) MCG/ACT inhaler ?Commonly known as: VENTOLIN HFA ?Inhale 2 puffs into the lungs every 4 (four) hours as needed for wheezing or shortness of breath. ?  ?albuterol (2.5 MG/3ML) 0.083% nebulizer solution ?Commonly known as: PROVENTIL ?Take 2.5 mg by nebulization every 6 (six) hours as needed for wheezing or shortness of breath. ?  ?ALPRAZolam  0.25 MG tablet ?Commonly known as: Duanne Moron ?Take 0.25 mg by mouth at bedtime as needed for anxiety. ?  ?atorvastatin 40 MG tablet ?Commonly known as: LIPITOR ?Take 40 mg by mouth every morning. ?  ?diltiazem 30 MG tablet ?Commonly known as: Cardizem ?Take 1 tablet (30 mg total) by mouth as needed (rapid HR/a fib). ?  ?doxycycline 50 MG capsule ?Commonly known as: VIBRAMYCIN ?TAKE 1 CAPSULE (50 MG TOTAL) BY MOUTH EVERY EVENING. TAKE WITH FOOD ?What changed: when to take this ?  ?EPINEPHrine 0.3 mg/0.3 mL Soaj injection ?Commonly known as: EPI-PEN ?Inject 0.3 mg into the muscle as needed for anaphylaxis. ?  ?fluticasone 110 MCG/ACT inhaler ?Commonly known as: Flovent HFA ?Inhale 2 puffs twice a day with spacer to help prevent cough and wheeze. Rinse mouth after use. ?What changed:  ?how much to take ?how to take this ?when to take this ?reasons to take this ?additional instructions ?  ?magnesium 30 MG tablet ?Take 30 mg by mouth daily. ?  ?NONFORMULARY OR COMPOUNDED ITEM ?Apply 1 application. topically in the morning and at bedtime. Ivermectin, Azelaic Acid, Metronidazole - Rosacea Cream ?  ?Nucala 100 MG/ML Soaj ?Generic drug: Mepolizumab ?Inject 1 mL (100 mg total) into the skin every 28 (twenty-eight) days. ?  ?olmesartan 20 MG tablet ?Commonly known as: BENICAR ?Take 20 mg by mouth every morning. ?  ?potassium  chloride 10 MEQ CR capsule ?Commonly known as: MICRO-K ?Take 20 mEq by mouth daily. ?  ?rivaroxaban 20 MG Tabs tablet ?Commonly known as: XARELTO ?Take 20 mg by mouth daily with breakfast. ?  ?triamterene-hydrochlorothiazide 37.5-25 MG capsule ?Commonly known as: DYAZIDE ?Take 1 capsule by mouth every morning. ?  ?Vitamin D3 25 MCG (1000 UT) Caps ?Take 1,000 Units by mouth daily. ?  ?zinc gluconate 50 MG tablet ?Take 50 mg by mouth daily. ?  ? ?  ? ? Follow-up Information   ? ? [name], MD Follow up in 1 week(s).   ?Specialty: Family Medicine ?Contact information: ?[address] ?[place]  [place] [address] ?[phone] ? ? ?  ?  ? ? Vickie Epley, MD .   ?Specialties: Cardiology, Radiology ?Contact information: ?[address] ?[place] [address] ?[phone] ? ? ?  ?  ? ?  ?  ? ?  ? ?Discharge Exam: ?Filed Weights  ? 05/07/21 0058  ?Weight: 71.2 kg  ? ? ? ?Condition at discharge: good ? ?The results of significant diagnostics from this hospitalization (including imaging, microbiology, ancillary and laboratory) are listed below for reference.  ? ?Imaging Studies: ?CT ABDOMEN PELVIS WO CONTRAST ? ?Result Date: 04/16/2021 ?CLINICAL DATA:  Incisional hernia repair on 04/09/2021. Pain all over the abdomen since. EXAM: CT ABDOMEN AND PELVIS WITHOUT CONTRAST TECHNIQUE: Multidetector CT imaging of the abdomen and pelvis was performed following the standard protocol without IV contrast. RADIATION DOSE REDUCTION: This exam was performed according to the departmental dose-optimization program which includes automated exposure control, adjustment of the mA and/or kV according to patient size and/or use of iterative reconstruction technique. COMPARISON:  CT examination dated March 30, 2021 FINDINGS: Lower chest: No acute abnormality. Hepatobiliary: No focal liver abnormality is seen. No gallstones, gallbladder wall thickening, or biliary dilatation. Pancreas: Unremarkable. No pancreatic ductal dilatation or surrounding inflammatory changes. Spleen: Normal in size without focal abnormality. Adrenals/Urinary Tract: Status post left nephrectomy. No evidence of right nephrolithiasis or hydronephrosis. Stomach/Bowel: Stomach is within normal limits. 7 mm appendicolith in the base of the appendix. Appendix is however normal in caliber without inflammatory changes. No evidence of bowel wall thickening, distention, or inflammatory changes. Vascular/Lymphatic: Aortic atherosclerosis. No enlarged abdominal or pelvic lymph nodes. Reproductive: Status post hysterectomy. No adnexal masses. Other: Postsurgical  changes in the anterior abdominal wall with multiple small foci of gas and subcutaneous fat stranding. No fluid collection or abscess. Musculoskeletal: Degenerative disease of the lumbar spine prominent at L5-S1. No acute osseous abnormality. IMPRESSION: 1. Postsurgical changes in the anterior abdominal wall for recent hernia repair without evidence of drainable fluid collection or abscess. 2. Bowel loops are normal in caliber. No evidence of colitis or diverticulitis. Normal appendix, with a appendicolith in the base of the appendix as before. 3. No evidence of nephrolithiasis or hydronephrosis. Status post left nephrectomy. 4. Degenerate disc disease of the lumbar spine prominent at L5-S1. No acute osseous abnormality. Electronically Signed   By: Keane Police D.O.   On: 04/16/2021 15:04  ? ?DG Chest Portable 1 View ? ?Result Date: 05/07/2021 ?CLINICAL DATA:  Chest pain EXAM: PORTABLE CHEST 1 VIEW COMPARISON:  05/21/2020 FINDINGS: Cardiac shadow is within normal limits. The lungs are clear bilaterally. No bony abnormality is seen. IMPRESSION: No active disease. Electronically Signed   By: Inez Catalina M.D.   On: 05/07/2021 01:43  ? ?ECHOCARDIOGRAM COMPLETE ? ?Result Date: 05/07/2021 ?   ECHOCARDIOGRAM REPORT   Patient Name:  Heidi Torres Date of Exam: 05/07/2021 Medical Rec #:  [id number]        Height:       66.0 in Accession #:    [id number]       Weight:       157.0 lb Date of Birth:  [birth year]        BSA:          1.804 m? Patient Age:    25 years         BP:           131/63 mmHg Patient Gender: F                HR:           49 bpm. Exam Location:  [facility] Procedure: 2D Echo, Color Doppler and Cardiac Doppler Indications:     I48.91 Atrial fibrillation  History:         Patient has no prior history of Echocardiogram examinations.                  CAD, CKD; Risk Factors:Hypertension and Dyslipidemia.  Sonographer:     Charmayne Sheer Referring Phys:  [id number] Thurmond Butts MATTHEW ORGEL Diagnosing Phys: Donnelly Angelica IMPRESSIONS  1.  Left ventricular ejection fraction, by estimation, is 70 to 75%. The left ventricle has normal function. The left ventricle has no regional wall motion abnormalities. There is severe left ventricular hypertr

## 2021-05-08 NOTE — Consult Note (Signed)
[facility] Cardiology ? ?CARDIOLOGY CONSULT NOTE  ?Patient ID: ?Heidi Torres ?MRN: [id number] ?DOB/AGE: 67-Nov-1957 67 y.o. ? ?Admit date: 05/07/2021 ?Referring Physician Ivor Costa ?Primary Physician [name], MD ?Primary Cardiologist [place] ?Reason for Consultation AF with RVR, elevated troponin ? ?HPI:  ?Heidi Torres is a 66 year old female with history of paroxysmal atrial fibrillation, hypertension, hyperlipidemia, RCC s/p left nephrectomy 2019 who presents with palpitations and chest pain.  She was discovered to be in A-fib with RVR and troponin is mildly elevated.  She is now in normal sinus rhythm. Echo shows severe LVH possibly consistent with HCM. ? ?Interval history: ?- No acute events ?- Feeling well this morning.  ?- Eager to go home.  ? ? ?Review of systems complete and found to be negative unless listed above  ? ? ? ?Past Medical History:  ?Diagnosis Date  ? Anemia   ? Angina at rest [facility])   ? Anxiety   ? a.) on BZO PRN  ? Aortic atherosclerosis ([place])   ? Arthritis   ? Asthma   ? Atrial fibrillation ([place])   ? a.) CHA2DS2-VASc = 4 (age, sex, HTN, aortic plaque). b.) rate/rhythm maintained on oral metoprolol tartrate; chronically anticoagulated using rivaroxaban  ? CAD (coronary artery disease)   ? a.) LHC approx 2003/2004 revealed no obstructive CAD.  ? Cataract cortical, senile   ? CKD (chronic kidney disease), stage III ([place])   ? a.) s/p LEFT nephrectomy d/t RCC 02/24/2017  ? Eosinophilic esophagitis   ? Esophageal stricture   ? a.) s/p dilation  ? GERD (gastroesophageal reflux disease)   ? Heart murmur   ? History of hiatal hernia   ? History of left nephrectomy 02/24/2017  ? a.) d/t to [facility] Dx  ? Hx of dysplastic nevus   ? multiple sites  ? Hx of squamous cell carcinoma of skin 06/22/2015  ? R infrascapular, SCC in situ  ? Hyperlipidemia   ? Hypertension   ? Long term (current) use of immunosuppressive biologic   ? a.) mepolizumab used for asthma and EOE  ? Long term current use of  anticoagulant   ? a.) rivaroxaban  ? Ocular migraine   ? Pneumonia 11/2016  ? PSVT (paroxysmal supraventricular tachycardia) ([place])   ? a.) Holter 03/02/2021: 80 PSVT episodes; fastest lasting 7 beats (max 187 bpm), and the longest lasting 16 beats (max 122 bpm)  ? Pulmonary infection due to Mycobacterium avium-intracellulare (MAI) ([place]) 12/15/2016  ? Renal cell cancer, left ([place]) 2019  ? a.) stage III (cT3a, cN0, cM0) clear cell RCC; s/p LEFT nephrectomy 02/24/2017; could not tolerate adjuvant chemotherapy (sorafenib), even with dose reduction, due to GI side effects and effects on thyroid panel.  ?  ?Past Surgical History:  ?Procedure Laterality Date  ? ABLATION    ? prior to hysterectomy  ? CYSTOSCOPY/RETROGRADE/URETEROSCOPY Left 01/20/2017  ? Procedure: CYSTOSCOPY/RETROGRADE/URETEROSCOPY/ BIOPSY,BLADDER BIOPSY PLACEMENT STENT LEFT URETER;  Surgeon: Festus Aloe, MD;  Location: [facility] ORS;  Service: Urology;  Laterality: Left;  ? DILATION AND CURETTAGE OF UTERUS    ? ESOPHAGOGASTRODUODENOSCOPY N/A 12/16/2019  ? Procedure: ESOPHAGOGASTRODUODENOSCOPY (EGD);  Surgeon: Lesly Rubenstein, MD;  Location: [facility] ENDOSCOPY;  Service: Endoscopy;  Laterality: N/A;  ? ESOPHAGOGASTRODUODENOSCOPY (EGD) WITH PROPOFOL N/A 01/09/2019  ? Procedure: ESOPHAGOGASTRODUODENOSCOPY (EGD) WITH PROPOFOL;  Surgeon: Toledo, Benay Pike, MD;  Location: [facility] ENDOSCOPY;  Service: Gastroenterology;  Laterality: N/A;  ? LEFT HEART CATH AND CORONARY ANGIOGRAPHY Left   ? approximately 2003-2004; notes unavailable for review; results "ok" per  patient.  ? NASAL ENDOSCOPY WITH EPISTAXIS CONTROL    ? ROBOTIC ASSITED PARTIAL NEPHRECTOMY Left 02/24/2017  ? Procedure: XI ROBOTIC ASSITED LEFT  RADICAL NEPHRECTOMY;  Surgeon: Alexis Frock, MD;  Location: [facility] ORS;  Service: Urology;  Laterality: Left;  ? TUBAL LIGATION Bilateral 05/14/1991  ? VAGINAL HYSTERECTOMY    ? XI ROBOTIC ASSISTED VENTRAL HERNIA N/A 04/09/2021  ? Procedure: XI ROBOTIC ASSISTED VENTRAL  HERNIA;  Surgeon: Herbert Pun, MD;  Location: [facility] ORS;  Service: General;  Laterality: N/A;  ?  ?Medications Prior to Admission  ?Medication Sig Dispense Refill Last Dose  ? ALPRAZolam (XANAX) 0.25 MG tablet Take 0.25 mg by mouth at bedtime as needed for anxiety.  5 Past Week at prn  ? NONFORMULARY OR COMPOUNDED ITEM Apply 1 application. topically in the morning and at bedtime. Ivermectin, Azelaic Acid, Metronidazole - Rosacea Cream   05/06/2021  ? rivaroxaban (XARELTO) 20 MG TABS tablet Take 20 mg by mouth daily with breakfast.   05/07/2021 at 0000  ? albuterol (PROVENTIL) (2.5 MG/3ML) 0.083% nebulizer solution Take 2.5 mg by nebulization every 6 (six) hours as needed for wheezing or shortness of breath.   prn  ? albuterol (VENTOLIN HFA) 108 (90 Base) MCG/ACT inhaler Inhale 2 puffs into the lungs every 4 (four) hours as needed for wheezing or shortness of breath.   prn  ? atorvastatin (LIPITOR) 40 MG tablet Take 40 mg by mouth every morning.   05/05/2021  ? Cholecalciferol (VITAMIN D3) 1000 units CAPS Take 1,000 Units by mouth daily.   05/05/2021  ? doxycycline (VIBRAMYCIN) 50 MG capsule TAKE 1 CAPSULE (50 MG TOTAL) BY MOUTH EVERY EVENING. TAKE WITH FOOD (Patient taking differently: Take 50 mg by mouth every morning. Take with food) 30 capsule 2 05/05/2021  ? EPINEPHrine 0.3 mg/0.3 mL IJ SOAJ injection Inject 0.3 mg into the muscle as needed for anaphylaxis. 1 each 2 prn  ? fluticasone (FLOVENT HFA) 110 MCG/ACT inhaler Inhale 2 puffs twice a day with spacer to help prevent cough and wheeze. Rinse mouth after use. (Patient taking differently: Inhale 2 puffs into the lungs 2 (two) times daily as needed (cough or wheeze). Rinse mouth after use.) 1 each 3 prn  ? magnesium 30 MG tablet Take 30 mg by mouth daily.   05/05/2021  ? Mepolizumab (NUCALA) 100 MG/ML SOAJ Inject 1 mL (100 mg total) into the skin every 28 (twenty-eight) days. 1 mL 11 05/05/2021  ? metoprolol tartrate (LOPRESSOR) 25 MG tablet Take 12.5 mg by mouth 2  (two) times daily as needed (A-fib).   prn  ? olmesartan (BENICAR) 20 MG tablet Take 20 mg by mouth every morning.   05/05/2021  ? potassium chloride (MICRO-K) 10 MEQ CR capsule Take 20 mEq by mouth daily.  11 05/05/2021  ? triamterene-hydrochlorothiazide (DYAZIDE) 37.5-25 MG per capsule Take 1 capsule by mouth every morning.   05/05/2021  ? zinc gluconate 50 MG tablet Take 50 mg by mouth daily.   05/05/2021  ? ? ?Social History  ? ?Socioeconomic History  ? Marital status: Married  ?  Spouse name: Not on file  ? Number of children: 3  ? Years of education: Not on file  ? Highest education level: Not on file  ?Occupational History  ? Occupation: Pharmacist, hospital  ?Tobacco Use  ? Smoking status: Never  ? Smokeless tobacco: Never  ?Vaping Use  ? Vaping Use: Never used  ?Substance and Sexual Activity  ? Alcohol use: No  ? Drug use: Never  ?  Sexual activity: Yes  ?Other Topics Concern  ? Not on file  ?Social History Narrative  ? 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  No alcohol no caffeine no drug use or other tobacco.  ? ?Social Determinants of Health  ? ?Financial Resource Strain: Not on file  ?Food Insecurity: Not on file  ?Transportation Needs: Not on file  ?Physical Activity: Not on file  ?Stress: Not on file  ?Social Connections: Not on file  ?Intimate Partner Violence: Not on file  ?  ?Family History  ?Problem Relation Age of Onset  ? Heart disease Mother   ? Hyperlipidemia Mother   ? Squamous cell carcinoma Father   ? Heart disease Father   ? Hypertension Father   ? Hypertension Sister   ? Hypertension Brother   ? AAA (abdominal aortic aneurysm) Maternal Grandmother   ? Lung cancer Maternal Grandfather   ?     smoker  ? Colon cancer Neg Hx   ? Esophageal cancer Neg Hx   ? Stomach cancer Neg Hx   ?  ? ? ?Review of systems complete and found to be negative unless listed above  ? ? ? ? ?PHYSICAL EXAM ? ?General: Well developed, well nourished, in no acute distress ?HEENT:  Normocephalic and  atramatic ?Neck:  No JVD.  ?Lungs: Clear bilaterally to auscultation and percussion. ?Heart: HRRR .2/6 systolic murmur ?Abdomen: Bowel sounds are positive, abdomen soft and non-tender  ?Msk:  Back normal, normal ga

## 2021-05-08 NOTE — ED Notes (Signed)
Patient is resting quietly, chest rise and fall observed. VS are stable. No acute distress noted ?

## 2021-05-08 NOTE — Plan of Care (Addendum)
Pt given discharge teaching. No questions or concerns voiced at this time. IV d/c and tele off. IV like free of swelling or bruising dressing in place. Patient waiting on her husband to pick her up. Once her ride is here patient will be escorted out via wheelchair. ?

## 2021-05-10 ENCOUNTER — Encounter: Payer: Self-pay | Admitting: General Surgery

## 2021-05-11 ENCOUNTER — Other Ambulatory Visit: Payer: Self-pay | Admitting: Internal Medicine

## 2021-05-11 DIAGNOSIS — I421 Obstructive hypertrophic cardiomyopathy: Secondary | ICD-10-CM

## 2021-05-13 ENCOUNTER — Encounter: Payer: Medicare Other | Admitting: Physical Therapy

## 2021-05-20 ENCOUNTER — Encounter: Payer: Medicare Other | Admitting: Physical Therapy

## 2021-05-25 ENCOUNTER — Telehealth (HOSPITAL_COMMUNITY): Payer: Self-pay | Admitting: *Deleted

## 2021-05-25 NOTE — Telephone Encounter (Signed)
Reaching out to patient to offer assistance regarding upcoming cardiac imaging study; pt verbalizes understanding of appt date/time, parking situation and where to check in and verified current allergies; name and call back number provided for further questions should they arise  Gordy Clement RN [place] [facility] [phone] office [phone] cell  Patient reports claustrophobia but was advised to take her Xanax. She will have a driver for the test. She states she has dental implants.

## 2021-05-26 ENCOUNTER — Ambulatory Visit
Admission: RE | Admit: 2021-05-26 | Discharge: 2021-05-26 | Disposition: A | Discharge status: Home / Self Care | Payer: Medicare Other | Source: Ambulatory Visit | Attending: Internal Medicine | Admitting: Internal Medicine

## 2021-05-26 DIAGNOSIS — I421 Obstructive hypertrophic cardiomyopathy: Secondary | ICD-10-CM | POA: Diagnosis present

## 2021-05-26 MED ORDER — GADOBUTROL 1 MMOL/ML IV SOLN
10.0000 mL | Freq: Once | INTRAVENOUS | Status: AC | PRN
  Administered 2021-05-26: 10 mL via INTRAVENOUS

## 2021-06-03 ENCOUNTER — Encounter: Payer: Medicare Other | Admitting: Physical Therapy

## 2021-06-08 ENCOUNTER — Other Ambulatory Visit (HOSPITAL_COMMUNITY): Payer: Self-pay | Admitting: Internal Medicine

## 2021-06-18 ENCOUNTER — Other Ambulatory Visit: Payer: Self-pay | Admitting: Dermatology

## 2021-06-18 DIAGNOSIS — L719 Rosacea, unspecified: Secondary | ICD-10-CM

## 2021-06-21 ENCOUNTER — Encounter: Payer: Medicare Other | Admitting: Physical Therapy

## 2021-07-11 ENCOUNTER — Other Ambulatory Visit: Payer: Self-pay | Admitting: Family

## 2021-07-11 NOTE — Telephone Encounter (Signed)
Ok to refill Flovent 110 mcg 2 puffs twice a day with spacer. Quantity #1 with 1 refill.

## 2021-08-04 ENCOUNTER — Encounter: Payer: Self-pay | Admitting: Nurse Practitioner

## 2021-08-21 ENCOUNTER — Emergency Department: Payer: Medicare Other

## 2021-08-21 ENCOUNTER — Other Ambulatory Visit: Payer: Self-pay

## 2021-08-21 ENCOUNTER — Encounter: Payer: Self-pay | Admitting: Nurse Practitioner

## 2021-08-21 ENCOUNTER — Emergency Department: Payer: Medicare Other | Admitting: Anesthesiology

## 2021-08-21 ENCOUNTER — Inpatient Hospital Stay
Admission: EM | Admit: 2021-08-21 | Discharge: 2021-08-21 | DRG: 023 | Disposition: A | Discharge status: Other Acute Inpatient Hospital | Payer: Medicare Other | Attending: Neurological Surgery | Admitting: Neurological Surgery

## 2021-08-21 ENCOUNTER — Encounter (HOSPITAL_COMMUNITY): Payer: Self-pay

## 2021-08-21 ENCOUNTER — Encounter
Admission: EM | Disposition: A | Discharge status: Other Acute Inpatient Hospital | Payer: Self-pay | Source: Home / Self Care | Attending: Neurological Surgery

## 2021-08-21 DIAGNOSIS — J9601 Acute respiratory failure with hypoxia: Secondary | ICD-10-CM | POA: Diagnosis present

## 2021-08-21 DIAGNOSIS — M1990 Unspecified osteoarthritis, unspecified site: Secondary | ICD-10-CM | POA: Diagnosis present

## 2021-08-21 DIAGNOSIS — Z20822 Contact with and (suspected) exposure to covid-19: Secondary | ICD-10-CM | POA: Diagnosis present

## 2021-08-21 DIAGNOSIS — G8191 Hemiplegia, unspecified affecting right dominant side: Secondary | ICD-10-CM | POA: Diagnosis present

## 2021-08-21 DIAGNOSIS — Z9889 Other specified postprocedural states: Secondary | ICD-10-CM

## 2021-08-21 DIAGNOSIS — R29720 NIHSS score 20: Secondary | ICD-10-CM | POA: Diagnosis present

## 2021-08-21 DIAGNOSIS — R29810 Facial weakness: Secondary | ICD-10-CM | POA: Diagnosis present

## 2021-08-21 DIAGNOSIS — G935 Compression of brain: Secondary | ICD-10-CM | POA: Diagnosis present

## 2021-08-21 DIAGNOSIS — N1831 Chronic kidney disease, stage 3a: Secondary | ICD-10-CM | POA: Diagnosis present

## 2021-08-21 DIAGNOSIS — E1122 Type 2 diabetes mellitus with diabetic chronic kidney disease: Secondary | ICD-10-CM | POA: Diagnosis present

## 2021-08-21 DIAGNOSIS — Z8701 Personal history of pneumonia (recurrent): Secondary | ICD-10-CM

## 2021-08-21 DIAGNOSIS — I700 Atherosclerosis of aorta: Secondary | ICD-10-CM | POA: Diagnosis present

## 2021-08-21 DIAGNOSIS — E876 Hypokalemia: Secondary | ICD-10-CM | POA: Diagnosis present

## 2021-08-21 DIAGNOSIS — I2510 Atherosclerotic heart disease of native coronary artery without angina pectoris: Secondary | ICD-10-CM | POA: Diagnosis present

## 2021-08-21 DIAGNOSIS — R29730 NIHSS score 30: Secondary | ICD-10-CM | POA: Diagnosis present

## 2021-08-21 DIAGNOSIS — R4701 Aphasia: Secondary | ICD-10-CM | POA: Diagnosis present

## 2021-08-21 DIAGNOSIS — I160 Hypertensive urgency: Secondary | ICD-10-CM | POA: Diagnosis present

## 2021-08-21 DIAGNOSIS — Z9851 Tubal ligation status: Secondary | ICD-10-CM

## 2021-08-21 DIAGNOSIS — I480 Paroxysmal atrial fibrillation: Secondary | ICD-10-CM | POA: Diagnosis present

## 2021-08-21 DIAGNOSIS — I610 Nontraumatic intracerebral hemorrhage in hemisphere, subcortical: Secondary | ICD-10-CM | POA: Diagnosis present

## 2021-08-21 DIAGNOSIS — Z7901 Long term (current) use of anticoagulants: Secondary | ICD-10-CM

## 2021-08-21 DIAGNOSIS — R471 Dysarthria and anarthria: Secondary | ICD-10-CM | POA: Diagnosis present

## 2021-08-21 DIAGNOSIS — Z90710 Acquired absence of both cervix and uterus: Secondary | ICD-10-CM

## 2021-08-21 DIAGNOSIS — G936 Cerebral edema: Secondary | ICD-10-CM | POA: Diagnosis present

## 2021-08-21 DIAGNOSIS — J4540 Moderate persistent asthma, uncomplicated: Secondary | ICD-10-CM | POA: Diagnosis present

## 2021-08-21 DIAGNOSIS — Z79899 Other long term (current) drug therapy: Secondary | ICD-10-CM

## 2021-08-21 DIAGNOSIS — Z8249 Family history of ischemic heart disease and other diseases of the circulatory system: Secondary | ICD-10-CM

## 2021-08-21 DIAGNOSIS — Z83438 Family history of other disorder of lipoprotein metabolism and other lipidemia: Secondary | ICD-10-CM

## 2021-08-21 DIAGNOSIS — Z9221 Personal history of antineoplastic chemotherapy: Secondary | ICD-10-CM

## 2021-08-21 DIAGNOSIS — E782 Mixed hyperlipidemia: Secondary | ICD-10-CM | POA: Diagnosis present

## 2021-08-21 DIAGNOSIS — K2100 Gastro-esophageal reflux disease with esophagitis, without bleeding: Secondary | ICD-10-CM | POA: Diagnosis present

## 2021-08-21 DIAGNOSIS — Z85828 Personal history of other malignant neoplasm of skin: Secondary | ICD-10-CM

## 2021-08-21 DIAGNOSIS — I615 Nontraumatic intracerebral hemorrhage, intraventricular: Secondary | ICD-10-CM | POA: Diagnosis present

## 2021-08-21 DIAGNOSIS — F419 Anxiety disorder, unspecified: Secondary | ICD-10-CM | POA: Diagnosis present

## 2021-08-21 DIAGNOSIS — I129 Hypertensive chronic kidney disease with stage 1 through stage 4 chronic kidney disease, or unspecified chronic kidney disease: Secondary | ICD-10-CM | POA: Diagnosis present

## 2021-08-21 DIAGNOSIS — Z905 Acquired absence of kidney: Secondary | ICD-10-CM

## 2021-08-21 DIAGNOSIS — R079 Chest pain, unspecified: Secondary | ICD-10-CM

## 2021-08-21 DIAGNOSIS — Z85528 Personal history of other malignant neoplasm of kidney: Secondary | ICD-10-CM

## 2021-08-21 DIAGNOSIS — G9349 Other encephalopathy: Secondary | ICD-10-CM | POA: Diagnosis present

## 2021-08-21 HISTORY — PX: CRANIOTOMY: SHX93

## 2021-08-21 LAB — PROTIME-INR
INR: 1.8 — ABNORMAL HIGH (ref 0.8–1.2)
Prothrombin Time: 20.3 seconds — ABNORMAL HIGH (ref 11.4–15.2)

## 2021-08-21 LAB — BASIC METABOLIC PANEL
Anion gap: 10 (ref 5–15)
BUN: 36 mg/dL — ABNORMAL HIGH (ref 8–23)
CO2: 25 mmol/L (ref 22–32)
Calcium: 9.9 mg/dL (ref 8.9–10.3)
Chloride: 100 mmol/L (ref 98–111)
Creatinine, Ser: 1.33 mg/dL — ABNORMAL HIGH (ref 0.44–1.00)
GFR, Estimated: 44 mL/min — ABNORMAL LOW (ref >60)
Glucose, Bld: 109 mg/dL — ABNORMAL HIGH (ref 70–99)
Potassium: 3.3 mmol/L — ABNORMAL LOW (ref 3.5–5.1)
Sodium: 135 mmol/L (ref 135–145)

## 2021-08-21 LAB — URINE DRUG SCREEN, QUALITATIVE (ARMC ONLY)
Amphetamines, Ur Screen: NOT DETECTED
Barbiturates, Ur Screen: NOT DETECTED
Benzodiazepine, Ur Scrn: NOT DETECTED
Cannabinoid 50 Ng, Ur ~~LOC~~: NOT DETECTED
Cocaine Metabolite,Ur ~~LOC~~: NOT DETECTED
MDMA (Ecstasy)Ur Screen: NOT DETECTED
Methadone Scn, Ur: NOT DETECTED
Opiate, Ur Screen: NOT DETECTED
Phencyclidine (PCP) Ur S: NOT DETECTED
Tricyclic, Ur Screen: NOT DETECTED

## 2021-08-21 LAB — URINALYSIS, ROUTINE W REFLEX MICROSCOPIC
Bacteria, UA: NONE SEEN
Bilirubin Urine: NEGATIVE
Glucose, UA: NEGATIVE mg/dL
Ketones, ur: NEGATIVE mg/dL
Nitrite: NEGATIVE
Protein, ur: 30 mg/dL — AB
Specific Gravity, Urine: 1.016 (ref 1.005–1.030)
pH: 6 (ref 5.0–8.0)

## 2021-08-21 LAB — CBC
HCT: 40.4 % (ref 36.0–46.0)
Hemoglobin: 13 g/dL (ref 12.0–15.0)
MCH: 27.7 pg (ref 26.0–34.0)
MCHC: 32.2 g/dL (ref 30.0–36.0)
MCV: 86 fL (ref 80.0–100.0)
Platelets: 240 10*3/uL (ref 150–400)
RBC: 4.7 MIL/uL (ref 3.87–5.11)
RDW: 13.7 % (ref 11.5–15.5)
WBC: 7 10*3/uL (ref 4.0–10.5)
nRBC: 0 % (ref 0.0–0.2)

## 2021-08-21 LAB — RESP PANEL BY RT-PCR (FLU A&B, COVID) ARPGX2
Influenza A by PCR: NEGATIVE
Influenza B by PCR: NEGATIVE
SARS Coronavirus 2 by RT PCR: NEGATIVE

## 2021-08-21 LAB — SODIUM
Sodium: 130 mmol/L — ABNORMAL LOW (ref 135–145)
Sodium: 136 mmol/L (ref 135–145)

## 2021-08-21 LAB — BLOOD GAS, ARTERIAL
Acid-base deficit: 2.3 mmol/L — ABNORMAL HIGH (ref 0.0–2.0)
Bicarbonate: 21.4 mmol/L (ref 20.0–28.0)
FIO2: 70 %
MECHVT: 470 mL
Mechanical Rate: 18
O2 Saturation: 100 %
PEEP: 5 cmH2O
Patient temperature: 37
pCO2 arterial: 33 mmHg (ref 32–48)
pH, Arterial: 7.42 (ref 7.35–7.45)
pO2, Arterial: 219 mmHg — ABNORMAL HIGH (ref 83–108)

## 2021-08-21 LAB — MRSA NEXT GEN BY PCR, NASAL: MRSA by PCR Next Gen: NOT DETECTED

## 2021-08-21 LAB — APTT: aPTT: 35 seconds (ref 24–36)

## 2021-08-21 LAB — CBG MONITORING, ED: Glucose-Capillary: 104 mg/dL — ABNORMAL HIGH (ref 70–99)

## 2021-08-21 LAB — ETHANOL: Alcohol, Ethyl (B): <10 mg/dL (ref <10)

## 2021-08-21 LAB — TROPONIN I (HIGH SENSITIVITY)
Troponin I (High Sensitivity): 15 ng/L (ref <18)
Troponin I (High Sensitivity): 11 ng/L (ref <18)

## 2021-08-21 SURGERY — CRANIOTOMY HEMATOMA EVACUATION SUBDURAL
Anesthesia: General | Site: Head | Laterality: Bilateral

## 2021-08-21 MED ORDER — LIDOCAINE-EPINEPHRINE 1 %-1:100000 IJ SOLN
INTRAMUSCULAR | Status: DC | PRN
  Administered 2021-08-21: 2 mL
  Administered 2021-08-21: 10 mL

## 2021-08-21 MED ORDER — PROPOFOL 1000 MG/100ML IV EMUL
5.0000 ug/kg/min | INTRAVENOUS | Status: DC
  Filled 2021-08-21: qty 100

## 2021-08-21 MED ORDER — ROCURONIUM BROMIDE 100 MG/10ML IV SOLN
INTRAVENOUS | Status: DC | PRN
  Administered 2021-08-21: 100 mg via INTRAVENOUS

## 2021-08-21 MED ORDER — MANNITOL 20 % IV SOLN
75.0000 g | Freq: Once | INTRAVENOUS | Status: AC
  Administered 2021-08-21: 75 g via INTRAVENOUS
  Filled 2021-08-21: qty 250

## 2021-08-21 MED ORDER — LIDOCAINE HCL (CARDIAC) PF 100 MG/5ML IV SOSY
PREFILLED_SYRINGE | INTRAVENOUS | Status: AC
  Administered 2021-08-21: 100 mg via INTRAVENOUS
  Filled 2021-08-21: qty 5

## 2021-08-21 MED ORDER — EMPTY CONTAINERS FLEXIBLE MISC
1800.0000 mg | Freq: Once | Status: AC
  Administered 2021-08-21: 1800 mg via INTRAVENOUS
  Filled 2021-08-21: qty 180

## 2021-08-21 MED ORDER — NICARDIPINE HCL IN NACL 20-0.86 MG/200ML-% IV SOLN: 0.0000 mg/h | INTRAVENOUS | Status: AC

## 2021-08-21 MED ORDER — SODIUM CHLORIDE 3 % IV BOLUS
250.0000 mL | Freq: Once | INTRAVENOUS | Status: AC
  Administered 2021-08-21: 250 mL via INTRAVENOUS
  Filled 2021-08-21: qty 500

## 2021-08-21 MED ORDER — LIDOCAINE HCL (CARDIAC) PF 100 MG/5ML IV SOSY: 100.0000 mg | PREFILLED_SYRINGE | Freq: Once | INTRAVENOUS | Status: AC

## 2021-08-21 MED ORDER — BACITRACIN ZINC 500 UNIT/GM EX OINT
TOPICAL_OINTMENT | CUTANEOUS | Status: AC
  Filled 2021-08-21: qty 28.35

## 2021-08-21 MED ORDER — PROPOFOL 1000 MG/100ML IV EMUL: 5.0000 ug/kg/min | INTRAVENOUS | Status: AC

## 2021-08-21 MED ORDER — ORAL CARE MOUTH RINSE: 15.0000 mL | OROMUCOSAL | 0 refills | Status: AC | PRN

## 2021-08-21 MED ORDER — FENTANYL CITRATE (PF) 250 MCG/5ML IJ SOLN
INTRAMUSCULAR | Status: AC
  Filled 2021-08-21: qty 5

## 2021-08-21 MED ORDER — ETOMIDATE 2 MG/ML IV SOLN: 20.0000 mg | Freq: Once | INTRAVENOUS | Status: AC

## 2021-08-21 MED ORDER — IOHEXOL 350 MG/ML SOLN
100.0000 mL | Freq: Once | INTRAVENOUS | Status: AC | PRN
  Administered 2021-08-21: 100 mL via INTRAVENOUS

## 2021-08-21 MED ORDER — ETOMIDATE 2 MG/ML IV SOLN
INTRAVENOUS | Status: AC
  Administered 2021-08-21: 20 mg via INTRAVENOUS
  Filled 2021-08-21: qty 10

## 2021-08-21 MED ORDER — ATROPINE SULFATE 1 MG/10ML IJ SOSY
PREFILLED_SYRINGE | INTRAMUSCULAR | Status: AC
  Filled 2021-08-21: qty 10

## 2021-08-21 MED ORDER — THROMBIN (RECOMBINANT) 5000 UNITS EX SOLR
CUTANEOUS | Status: DC | PRN
  Administered 2021-08-21: 5000 [IU] via TOPICAL

## 2021-08-21 MED ORDER — BACITRACIN 500 UNIT/GM EX OINT
TOPICAL_OINTMENT | CUTANEOUS | Status: DC | PRN
  Administered 2021-08-21: 1 via TOPICAL

## 2021-08-21 MED ORDER — STERILE WATER FOR IRRIGATION IR SOLN
Status: DC | PRN
  Administered 2021-08-21: 5 mL

## 2021-08-21 MED ORDER — FENTANYL 2500MCG IN NS 250ML (10MCG/ML) PREMIX INFUSION: 0.0000 ug/h | INTRAVENOUS | 0 refills | Status: AC

## 2021-08-21 MED ORDER — PROPOFOL 1000 MG/100ML IV EMUL
INTRAVENOUS | Status: AC
  Filled 2021-08-21: qty 100

## 2021-08-21 MED ORDER — CHLORHEXIDINE GLUCONATE CLOTH 2 % EX PADS
6.0000 | MEDICATED_PAD | Freq: Every day | CUTANEOUS | Status: DC
  Administered 2021-08-21: 6 via TOPICAL

## 2021-08-21 MED ORDER — ORAL CARE MOUTH RINSE: 15.0000 mL | OROMUCOSAL | 0 refills | Status: AC

## 2021-08-21 MED ORDER — CEFAZOLIN SODIUM-DEXTROSE 2-3 GM-%(50ML) IV SOLR
INTRAVENOUS | Status: DC | PRN
  Administered 2021-08-21: 2 g via INTRAVENOUS

## 2021-08-21 MED ORDER — PHENYLEPHRINE HCL-NACL 20-0.9 MG/250ML-% IV SOLN
INTRAVENOUS | Status: AC
  Filled 2021-08-21: qty 250

## 2021-08-21 MED ORDER — SUCCINYLCHOLINE CHLORIDE 200 MG/10ML IV SOSY
PREFILLED_SYRINGE | INTRAVENOUS | Status: AC
  Administered 2021-08-21: 200 mg via INTRAVENOUS
  Filled 2021-08-21: qty 10

## 2021-08-21 MED ORDER — 0.9 % SODIUM CHLORIDE (POUR BTL) OPTIME
TOPICAL | Status: DC | PRN
  Administered 2021-08-21 (×3): 1000 mL

## 2021-08-21 MED ORDER — LACTATED RINGERS IV SOLN: INTRAVENOUS | Status: DC | PRN

## 2021-08-21 MED ORDER — FENTANYL 2500MCG IN NS 250ML (10MCG/ML) PREMIX INFUSION
INTRAVENOUS | Status: AC
  Administered 2021-08-21: 25 ug/h via INTRAVENOUS
  Filled 2021-08-21: qty 250

## 2021-08-21 MED ORDER — FENTANYL 2500MCG IN NS 250ML (10MCG/ML) PREMIX INFUSION: 0.0000 ug/h | INTRAVENOUS | Status: DC

## 2021-08-21 MED ORDER — PROPOFOL BOLUS VIA INFUSION
40.0000 mg | Freq: Once | INTRAVENOUS | Status: AC
  Administered 2021-08-21: 40 mg via INTRAVENOUS
  Filled 2021-08-21: qty 40

## 2021-08-21 MED ORDER — SUCCINYLCHOLINE CHLORIDE 200 MG/10ML IV SOSY: 200.0000 mg | PREFILLED_SYRINGE | Freq: Once | INTRAVENOUS | Status: AC

## 2021-08-21 MED ORDER — ONDANSETRON HCL 4 MG/2ML IJ SOLN
INTRAMUSCULAR | Status: AC
  Filled 2021-08-21: qty 2

## 2021-08-21 MED ORDER — DEXAMETHASONE SODIUM PHOSPHATE 10 MG/ML IJ SOLN
INTRAMUSCULAR | Status: DC | PRN
  Administered 2021-08-21: 10 mg via INTRAVENOUS

## 2021-08-21 MED ORDER — ONDANSETRON HCL 4 MG/2ML IJ SOLN
INTRAMUSCULAR | Status: DC | PRN
  Administered 2021-08-21: 4 mg via INTRAVENOUS

## 2021-08-21 MED ORDER — ORAL CARE MOUTH RINSE: 15.0000 mL | OROMUCOSAL | Status: DC

## 2021-08-21 MED ORDER — ORAL CARE MOUTH RINSE: 15.0000 mL | OROMUCOSAL | Status: DC | PRN

## 2021-08-21 MED ORDER — PROPOFOL 1000 MG/100ML IV EMUL
INTRAVENOUS | Status: AC
  Administered 2021-08-21: 30 ug/kg/min via INTRAVENOUS
  Filled 2021-08-21: qty 100

## 2021-08-21 MED ORDER — LEVETIRACETAM IN NACL 1000 MG/100ML IV SOLN
1000.0000 mg | Freq: Once | INTRAVENOUS | Status: AC
  Administered 2021-08-21: 1000 mg via INTRAVENOUS
  Filled 2021-08-21: qty 100

## 2021-08-21 MED ORDER — PHENYLEPHRINE HCL-NACL 20-0.9 MG/250ML-% IV SOLN
INTRAVENOUS | Status: DC | PRN
  Administered 2021-08-21: 20 ug/min via INTRAVENOUS
  Administered 2021-08-21: 30 ug/min via INTRAVENOUS

## 2021-08-21 MED ORDER — SODIUM CHLORIDE 0.9 % IV SOLN: INTRAVENOUS | Status: DC | PRN

## 2021-08-21 MED ORDER — NICARDIPINE HCL IN NACL 20-0.86 MG/200ML-% IV SOLN
0.0000 mg/h | INTRAVENOUS | Status: DC
  Administered 2021-08-21: 5 mg/h via INTRAVENOUS
  Filled 2021-08-21: qty 200

## 2021-08-21 MED ORDER — FENTANYL CITRATE (PF) 100 MCG/2ML IJ SOLN
INTRAMUSCULAR | Status: DC | PRN
  Administered 2021-08-21: 100 ug via INTRAVENOUS

## 2021-08-21 MED ORDER — EPHEDRINE SULFATE (PRESSORS) 50 MG/ML IJ SOLN
INTRAMUSCULAR | Status: DC | PRN
  Administered 2021-08-21: 10 mg via INTRAVENOUS

## 2021-08-21 MED ORDER — PHENYLEPHRINE HCL (PRESSORS) 10 MG/ML IV SOLN
INTRAVENOUS | Status: DC | PRN
  Administered 2021-08-21 (×8): 80 ug via INTRAVENOUS

## 2021-08-21 MED ORDER — CHLORHEXIDINE GLUCONATE CLOTH 2 % EX PADS: 6.0000 | MEDICATED_PAD | Freq: Every day | CUTANEOUS | Status: AC

## 2021-08-21 MED ORDER — SODIUM CHLORIDE 23.4 % INJECTION (4 MEQ/ML) FOR IV ADMINISTRATION
120.0000 meq | Freq: Once | INTRAVENOUS | Status: DC
Start: 2021-08-21 — End: 2021-08-21
  Filled 2021-08-21: qty 30

## 2021-08-21 SURGICAL SUPPLY — 100 items
AGENT HMST KT MTR STRL THRMB (HEMOSTASIS) ×2
APL PRP STRL LF ISPRP CHG 10.5 (MISCELLANEOUS) ×2
APPLICATOR CHLORAPREP 10.5 ORG (MISCELLANEOUS) ×4 IMPLANT
BASIN GRAD PLASTIC 32OZ STRL (MISCELLANEOUS) ×2 IMPLANT
BLADE CLIPPER SPEC (BLADE) ×2 IMPLANT
BLADE SURG 15 STRL LF DISP TIS (BLADE) ×2 IMPLANT
BLADE SURG 15 STRL SS (BLADE) ×1
BULB RESERV EVAC DRAIN JP 100C (MISCELLANEOUS) IMPLANT
BUR ACORN 7.5 PRECISION (BURR) ×2 IMPLANT
BUR SPIRAL ROUTER 2.3 (BUR) ×2 IMPLANT
CLIP SPRNG 6 S-JAW DBL (CLIP) IMPLANT
CLIP SPRNG 6MM S-JAW DBL (CLIP) ×1
CNTNR SPEC 2.5X3XGRAD LEK (MISCELLANEOUS) ×3
CONT SPEC 4OZ STER OR WHT (MISCELLANEOUS) ×3
CONT SPEC 4OZ STRL OR WHT (MISCELLANEOUS) ×3
CONTAINER SPEC 2.5X3XGRAD LEK (MISCELLANEOUS) ×6 IMPLANT
COUNTER NEEDLE 20/40 LG (NEEDLE) ×2 IMPLANT
DRAPE INCISE 23X17 IOBAN STRL (DRAPES) ×2
DRAPE INCISE 23X17 STRL (DRAPES) ×4 IMPLANT
DRAPE INCISE IOBAN 23X17 STRL (DRAPES) ×2 IMPLANT
DRAPE INCISE IOBAN 66X45 STRL (DRAPES) ×2 IMPLANT
DRAPE INCISE IOBAN 66X60 STRL (DRAPES) IMPLANT
DRAPE SURG 17X11 SM STRL (DRAPES) ×8 IMPLANT
DRAPE WARM FLUID 44X44 (DRAPES) ×2 IMPLANT
DRSG TEGADERM 4X4.75 (GAUZE/BANDAGES/DRESSINGS) ×2 IMPLANT
DRSG TEGADERM 6X8 (GAUZE/BANDAGES/DRESSINGS) IMPLANT
ELBOW OX (ORTHOPEDIC SUPPLIES)
ELECT CAUTERY BLADE TIP 2.5 (TIP) ×1
ELECT REM PT RETURN 9FT ADLT (ELECTROSURGICAL) ×1
ELECTRODE CAUTERY BLDE TIP 2.5 (TIP) ×2 IMPLANT
ELECTRODE REM PT RTRN 9FT ADLT (ELECTROSURGICAL) ×2 IMPLANT
GAUZE 4X4 16PLY ~~LOC~~+RFID DBL (SPONGE) ×6 IMPLANT
GAUZE XEROFORM 1X8 LF (GAUZE/BANDAGES/DRESSINGS) ×2 IMPLANT
GLOVE BIOGEL PI IND STRL 6.5 (GLOVE) ×2 IMPLANT
GLOVE BIOGEL PI IND STRL 8.5 (GLOVE) ×2 IMPLANT
GLOVE BIOGEL PI INDICATOR 6.5 (GLOVE) ×1
GLOVE BIOGEL PI INDICATOR 8.5 (GLOVE) ×1
GLOVE SURG SYN 6.5 ES PF (GLOVE) ×1 IMPLANT
GLOVE SURG SYN 6.5 PF PI (GLOVE) ×2 IMPLANT
GLOVE SURG SYN 8.5  E (GLOVE) ×4
GLOVE SURG SYN 8.5 E (GLOVE) ×4 IMPLANT
GLOVE SURG SYN 8.5 PF PI (GLOVE) ×8 IMPLANT
GOWN SRG LRG LVL 4 IMPRV REINF (GOWNS) ×2 IMPLANT
GOWN SRG XL LVL 3 NONREINFORCE (GOWNS) ×2 IMPLANT
GOWN STRL NON-REIN TWL XL LVL3 (GOWNS) ×1
GOWN STRL REIN LRG LVL4 (GOWNS) ×1
GRADUATE 1200CC STRL 31836 (MISCELLANEOUS) ×2 IMPLANT
GRAFT DURAGEN MATRIX 5WX7L (Graft) IMPLANT
HEMOSTAT SURGICEL 2X14 (HEMOSTASIS) IMPLANT
HEMOSTAT SURGICEL 2X3 (HEMOSTASIS) IMPLANT
HEMOSTAT SURGICEL 4X8 (HEMOSTASIS) IMPLANT
HEMOVAC 400ML (MISCELLANEOUS) ×1
HOLDER FOLEY CATH W/STRAP (MISCELLANEOUS) ×2 IMPLANT
HOOK STAY BLUNT/RETRACTOR 5M (MISCELLANEOUS) ×2 IMPLANT
IV CATH ANGIO 12GX3 LT BLUE (NEEDLE) IMPLANT
JACKSON PRATT 7MM (INSTRUMENTS) IMPLANT
KIT DRAIN HEMOVAC JP 7FR 400ML (MISCELLANEOUS) IMPLANT
KIT TURNOVER KIT A (KITS) ×2 IMPLANT
MANIFOLD NEPTUNE II (INSTRUMENTS) ×2 IMPLANT
MARKER SKIN DUAL TIP RULER LAB (MISCELLANEOUS) ×4 IMPLANT
MAT ABSORB  FLUID 56X50 GRAY (MISCELLANEOUS) ×1
MAT ABSORB FLUID 56X50 GRAY (MISCELLANEOUS) ×2 IMPLANT
NEEDLE HYPO 22GX1.5 SAFETY (NEEDLE) ×2 IMPLANT
NS IRRIG 1000ML POUR BTL (IV SOLUTION) IMPLANT
ORTHOSIS ELBOW OX (ORTHOPEDIC SUPPLIES) IMPLANT
PACK CRANIOTOMY CUSTOM (CUSTOM PROCEDURE TRAY) ×2 IMPLANT
PAD ARMBOARD 7.5X6 YLW CONV (MISCELLANEOUS) ×4 IMPLANT
PERFORATOR LRG  14-11MM (BIT) ×1
PERFORATOR LRG 14-11MM (BIT) IMPLANT
PIN MAYFIELD SKULL DISP (PIN) IMPLANT
SET CATH VENT DRAIN 3-15 1.9D (DRAIN) IMPLANT
SHEET NEURO XL SOL CTL (MISCELLANEOUS) ×2 IMPLANT
SOL PREP PVP 2OZ (MISCELLANEOUS) ×1
SOL SCRUB PVP POV-IOD 4OZ 7.5% (MISCELLANEOUS) ×1
SOLUTION CELL SAVER (CLIP) ×1 IMPLANT
SOLUTION IRRIG SURGIPHOR (IV SOLUTION) ×2 IMPLANT
SOLUTION PREP PVP 2OZ (MISCELLANEOUS) ×2 IMPLANT
SOLUTION SCRB POV-IOD 4OZ 7.5% (MISCELLANEOUS) ×2 IMPLANT
STAPLER SKIN PROX 35W (STAPLE) ×4 IMPLANT
SURGIFLO W/THROMBIN 8M KIT (HEMOSTASIS) ×4 IMPLANT
SURGILUBE 2OZ TUBE FLIPTOP (MISCELLANEOUS) ×2 IMPLANT
SUT ETHILON 3-0 (SUTURE) IMPLANT
SUT ETHILON 3-0 FS-10 30 BLK (SUTURE) ×2
SUT NURALON 4 0 TR CR/8 (SUTURE) ×2 IMPLANT
SUT SILK 2 0 PERMA HAND 18 BK (SUTURE) IMPLANT
SUT VIC AB 0 CT1 27 (SUTURE) ×5
SUT VIC AB 0 CT1 27XCR 8 STRN (SUTURE) IMPLANT
SUT VIC AB 2-0 CT1 18 (SUTURE) ×4 IMPLANT
SUTURE EHLN 3-0 FS-10 30 BLK (SUTURE) IMPLANT
SYR 10ML LL (SYRINGE) ×2 IMPLANT
SYR 20ML LL LF (SYRINGE) ×4 IMPLANT
SYR 5ML LL (SYRINGE) IMPLANT
TAPE CLOTH 3X10 WHT NS LF (GAUZE/BANDAGES/DRESSINGS) ×2 IMPLANT
TAPE TRANSPORE STRL 2 31045 (GAUZE/BANDAGES/DRESSINGS) IMPLANT
TOWEL OR 17X26 4PK STRL BLUE (TOWEL DISPOSABLE) ×8 IMPLANT
TRAP FLUID SMOKE EVACUATOR (MISCELLANEOUS) ×2 IMPLANT
TRAY FOLEY SLVR 16FR TEMP STAT (SET/KITS/TRAYS/PACK) ×2 IMPLANT
TUBING CONNECTING 10 (TUBING) IMPLANT
WATER STERILE IRR 1000ML POUR (IV SOLUTION) ×6 IMPLANT
WATER STERILE IRR 500ML POUR (IV SOLUTION) ×2 IMPLANT

## 2021-08-21 NOTE — ED Provider Notes (Addendum)
Patient was being managed by Dr. Beather Arbour.  I was asked to help coordinate care and potential transfer for the patient while Dr. Beather Arbour was in a another critical patient's room that was receiving CPR.  Please see Dr. Asher Muir note for further detail.  Briefly, patient is a 66 year old female with history of A-fib on Xarelto who came into the emergency department with chest pain and headache.  Was hypertensive with blood pressures in the 409W systolics.  Initial head CT was unremarkable but during Dr. Asher Muir evaluation patient became aphasic with right-sided neurologic deficits.  Repeat head CT showed large hyperacute intraparenchymal hemorrhage in the left hemisphere measuring approximately 151 mL with a pronounced CTA spot sign indicating active hemorrhage.  She had intracranial edema, mass effect with 15 mm of rightward shift and early uncal herniation.  On exam patient had some spontaneous movements of the left foot but is intubated and sedated.  Her left pupil was approximately 6 to 7 mm while her right pupil was 2 to 3 mm.  Dr. Beather Arbour had already placed a consult to the critical care physician at [facility] for potential emergent transfer.  I spoke to Dr. Marchelle Gearing while Dr. Beather Arbour was in the other critical patient's room.  She recommended contacting neurosurgery at [facility] to make them aware of the patient. I also spoke to the tele neurologist here who had seen the patient as a Code Stroke and ordered 1 g/kg of mannitol for the patient based on their concerns for uncal herniation.  I then spoke to Dr. Curly Shores with neurology at [facility].  She was concerned that the patient would not be stable for transport to [facility] and then later reached out through secure chat that there were no available ICU beds at [facility].  Reversal for patient's Xarelto had already been ordered by Dr. Beather Arbour.  I spoke to the pharmacist Violeta Gelinas on the phone regarding the emergent need for this medication.  They were currently  reconstituting the medication and would bring it immediately upon availability.  At that time Dr. Nevada Crane with radiology had contacted me about the CT read and the concern for active hemorrhage within a very large intraparenchymal hemorrhage. I immediately contacted Dr. Catalina Pizza with neurosurgery.  He said he would come to the hospital emergently for a lifesaving surgical craniotomy for the patient.  Patient's husband updated at bedside regarding patient's critical status and the need for surgical intervention.  Patient already on a Cardene infusion and systolic blood pressure now on the 110s-120s.  Neurosurgery recommended giving IV Keppra and a bolus of hypertonic saline.  Those medications were ordered.  Care was transferred back over to Dr. Beather Arbour when she was out of the other critical patient's room.   CRITICAL CARE Performed by: Pryor Curia   Total critical care time: 30 minutes  Critical care time was exclusive of separately billable procedures and treating other patients.  Critical care was necessary to treat or prevent imminent or life-threatening deterioration.  Critical care was time spent personally by me on the following activities: development of treatment plan with patient and/or surrogate as well as nursing, discussions with consultants, evaluation of patient's response to treatment, examination of patient, obtaining history from patient or surrogate, ordering and performing treatments and interventions, ordering and review of laboratory studies, ordering and review of radiographic studies, pulse oximetry and re-evaluation of patient's condition.       Heidi Torres, Heidi Bison, DO 08/21/21 1444

## 2021-08-21 NOTE — Anesthesia Procedure Notes (Signed)
Arterial Line Insertion Start/End8/19/2023 8:19 AM, 08/21/2021 8:19 AM Performed by: Iran Ouch, MD, Rolla Plate, CRNA, CRNA  Patient location: Pre-op. Preanesthetic checklist: patient identified, IV checked, site marked, risks and benefits discussed, surgical consent, monitors and equipment checked, pre-op evaluation, timeout performed and anesthesia consent Left, radial was placed Catheter size: 20 G Hand hygiene performed , maximum sterile barriers used  and Seldinger technique used  Attempts: 1 Procedure performed without using ultrasound guided technique. Following insertion, dressing applied. Post procedure assessment: normal and unchanged

## 2021-08-21 NOTE — Discharge Summary (Signed)
Physician Discharge Summary  Patient ID: Heidi Torres MRN: [id number] DOB/AGE: 06-07-1955 66 y.o.  Admit date: 08/21/2021 Discharge date: 08/21/2021                                                  DISCHARGE SUMMARY   Heidi Torres is a 66 y.o. female who presented to [facility] ER on 08/21/2021 from home with complaints of chest pain, headache and elevated blood pressure. (History provided by ED handoff and documentation as patient currently unavailable without family bedside) Per ED documentation, patient reported titration off her amiodarone last week due to side effects. She reported experiencing chest pain the evening of 08/20/21 and noted elevated blood pressure. She called her PCP who instructed her to take 25 mg of metoprolol, which she did at 01:40 am. She endorsed nausea. She denies cough, SOB, abdominal pain, vomiting/dizziness at home.  ED course: Upon ER arrival patient alert and oriented with vitals demonstrating hypertensive urgency. Lab work significant for mild hypokalemia, renal function appeared to be at baseline. Initial CT head negative for abnormality & CXR negative for acute process. While in ED, patient developed dysarthria with LEFT sided facial droop and RIGHT leg weakness. CODE STROKE initiated. Patient subsequently vomited and developed RIGHT sided hemiplegia. Tele-neuro consulted and patient intubated for airway protection before being sent for repeat imaging.  Initial NIHSS: 20. Repeat CTH showed active hemorrhage, ICH score: 2. Patient taken emergently to OR by neurosurgery and underwent left craniectomy with subdural drain placement for evacuation of hemorrhage and right sided EVD.  [name]    ED Medications given: Etomidate, IV contrast, Lidocaine IVP, succinylcholine, fentanyl & propofol drip started, keppra, Andexxa, mannitol, NaCl 3% ED Initial Vitals: 98.1, 66, 16, 208/78  & 99% on RA EKG Interpretation: Date: 08/21/21, EKG Time: 02:25, Rate: 66, Rhythm: NSR, QRS Axis:  LAD, Intervals: LAFB, ST/T Wave abnormalities: non specific T wave inversions, Narrative Interpretation: NSR with LAFB and borderline LVH Chemistry: Na+: 135, K+: 3.3, BUN/Cr.: 36/ 1.33, Serum CO2/ AG: 25/10 Hematology: WBC: 7.0, Hgb: 13,  Troponin: 15 > 11, COVID-19 & Influenza A/B: negative  Pertinent  Medical History  CAD Atrial Fibrillation on Xarelto HCM CKD stage 3a s/p LEFT nephrectomy (2019) due to RCC Eosinophilic Esophagitis Esophageal stricture s/p dilation SCC on the skin HLD HTN PSVT Asthma Anxiety Anemia  SIGNIFICANT DIAGNOSTIC STUDIES CXR 08/21/21: no acute cardiopulmonary process CT head wo contrast : no acute intracranial abnormality CT angio head & neck w wo contrast 08/21/21: Large hyperacute intra-axial hemorrhage in the left hemisphere, new since 0243 hours today. Estimated Hematoma volume 151 mL, with pronounced CTA spot sign indicating ACTIVE HEMORRHAGE. Recommend emergent Neurosurgery consultation. Intracranial edema and mass effect including 15 mL rightward midline shift. Early left uncal herniation. Early trapping of the right lateral ventricle. No IVH or extra-axial blood at this time. And CTA Head and Neck is otherwise negative; no intracranial aneurysm. CT angio chest/abd/pelvis 08/21/21: Positive for a small pericardial effusion with mildly complex fluid density, but negative for aortic dissection, aortic aneurysm, or periaortic hematoma. Overall significance of this pericardial effusion is doubtful in the setting of severe intracranial hemorrhage. Consider Pericarditis. Endotracheal tube tip in good position with layering retained secretions at the tip of the tube, but other major  airways are patent. Mild atelectasis. Enteric tube terminates in the gastric body. No acute or inflammatory finding in the abdomen, or pelvis. Chronic left nephrectomy. Postop Repeat CT  Head 08/21/21: Interval left craniectomy with subdural drain placement and right frontal ventricular catheter placement. The right frontal ventricular catheter appears to terminate in the below described hematoma. Overall stable size of the large intraparenchymal hematoma centered in the left cerebral hemisphere but with significantly increased intraventricular hemorrhage and concern for developing hydrocephalus/trapped ventricle. 1.3 cm rightward midline shift is overall similar to the prior study, though there is increased effacement of the basal cisterns and medialization of the uncus.  MICRO DATA  MRSA PCR 08/21/2021>>negative Resp Panel by RT-PCR (Flu A&B, Covid) 08/21/2021>>negative   ANTIBIOTICS None   CONSULTS Critical Care Team  Neurosurgery   TUBES / LINES Size 7.5 cm ETT 08/21/2021>> Left radial arterial line 08/21/2021>> 14 Fr Urethral Catheter 08/21/2021>>  Discharge Exam: General: Acutely ill female, lying in bed intubated & sedated requiring mechanical ventilation, NAD HEENT: Left craniectomy surgical site, left superior scalp JP drain/left inferior scalp JP drain draining sanguineous drainage  Neuro: Sedated, unresponsive, unequal pupils right 2 mm left 3 mm sluggish  CV: Sinus bradycardia, s1s2, no r/g, 2+ radial/1+ distal pulses, trace generalized edema  Pulm: Clear throughout, even, non labored  GI: Hypoactive BS x4, obese, soft, non distended  GU: foley in place with clear yellow urine Skin: Left craniectomy surgical site, left superior scalp JP drain/left inferior scalp JP drain draining sanguineous drainage Extremities: Normal bulk and tone     Vitals:   08/21/21 1330 08/21/21 1345 08/21/21 1355 08/21/21 1400  BP: 109/63   112/61  Pulse: (!) 44 (!) 45 (!) 46 (!) 46  Resp: '12 12 12 12  '$ Temp: (!) 93.4 F (34.1 C) (!) 93.9 F (34.4 C) (!) 94.1 F (34.5 C) (!) 94.3 F (34.6 C)  TempSrc:      SpO2: 100% 100% 100% 100%  Weight:      Height:          Discharge Labs  BMET Recent Labs  Lab 08/21/21 0233 08/21/21 0518 08/21/21 1158  NA 135 130* 136  K 3.3*  --   --   CL 100  --   --   CO2 25  --   --   GLUCOSE 109*  --   --   BUN 36*  --   --   CREATININE 1.33*  --   --   CALCIUM 9.9  --   --     CBC Recent Labs  Lab 08/21/21 0233  HGB 13.0  HCT 40.4  WBC 7.0  PLT 240    Anti-Coagulation Recent Labs  Lab 08/21/21 0337  INR 1.8*          Allergies as of 08/21/2021       Reactions   Benzoin    Other reaction(s): Other (See Comments) Blisters   Ciprofloxacin Other (See Comments)   Arms were numb   Dupilumab Other (See Comments)   Painful to use and lump/ring at injection    Steri-strip Compound Benzoin [benzoin Compound] Other (See Comments)   Blisters        Medication List     STOP taking these medications    albuterol (2.5 MG/3ML) 0.083% nebulizer solution Commonly known as: PROVENTIL   albuterol 108 (90 Base) MCG/ACT inhaler Commonly known as: VENTOLIN HFA   ALPRAZolam 0.25 MG tablet Commonly known as: XANAX   atorvastatin 40 MG tablet  Commonly known as: LIPITOR   diltiazem 30 MG tablet Commonly known as: Cardizem   doxycycline 50 MG capsule Commonly known as: VIBRAMYCIN   EPINEPHrine 0.3 mg/0.3 mL Soaj injection Commonly known as: EPI-PEN   fluticasone 110 MCG/ACT inhaler Commonly known as: Flovent HFA   magnesium 30 MG tablet   NONFORMULARY OR COMPOUNDED ITEM   Nucala 100 MG/ML Soaj Generic drug: Mepolizumab   olmesartan 20 MG tablet Commonly known as: BENICAR   potassium chloride 10 MEQ CR capsule Commonly known as: MICRO-K   rivaroxaban 20 MG Tabs tablet Commonly known as: XARELTO   triamterene-hydrochlorothiazide 37.5-25 MG capsule Commonly known as: DYAZIDE   Vitamin D3 25 MCG (1000 UT) Caps   zinc gluconate 50 MG tablet       TAKE these medications    Chlorhexidine Gluconate Cloth 2 % Pads Apply 6 each topically daily. Start taking on:  August 22, 2021   fentaNYL 10 mcg/ml Soln infusion Inject 0-400 mcg/hr into the vein continuous.   mouth rinse Liqd solution 15 mLs by Mouth Rinse route every 2 (two) hours.   mouth rinse Liqd solution 15 mLs by Mouth Rinse route as needed (oral care).   niCARdipine in saline 20-0.86 MG/200ML-% Soln Commonly known as: CARDENE-IV Inject 0-15 mg/hr into the vein continuous.   propofol 1000 MG/100ML Emul injection Commonly known as: DIPRIVAN Inject 363-5,808 mcg/min into the vein continuous.         Disposition: Transfer to [place] Pager [phone] (please enter 7 digits) PCCM Consult Pager [phone] (please enter 7 digits)

## 2021-08-21 NOTE — Op Note (Signed)
Indications: 66 y.o. with left ICH  Findings: left ICH  Preoperative Note:    Risks of surgery discussed include: infection, stroke, coma, death, paralysis and risks of anesthesia. Family understood these risks and have agreed to proceed.  Operative Note:   Patient was brought in to the operating room.  The operating table was turned so that the left side was up.   I shaved the left side of her head.  I then marked out a reverse question  Keeping away from midline.  Patient was prepped and draped in a sterile fashion.  Prior to incision a timeout was done.  We made sure that we were correct on the correct side of the head which was the left side.  IV antibiotics were given.  Using a #10 blade I opened the scalp.  Hemostasis was obtained.  Using a subperiosteal dissector I dissected off the temporalis muscle forward.  I found the zygomatic arch.  I then marked out for bur holes.  I then made these bur holes.  I then used a footplate to connect these bur holes.  This was done uneventfully.  We then used a 15 blade to open the dura.  I then brought in the ultrasound and could see the hematoma.  I then decided to do a small corticectomy with the bipolar.  I found hematoma I attempted to evacuate as much as I could.  It did look a little bit better on the ultrasound.  I then placed a drain on the brain I got a DuraGen to place over the brain and placed another drain that was tunneled.  I then closed the galea and the skin.  Due to the fact that she had major midline shift I felt it would be pertinent to try to place a right sided EVD.  I then undraped and marked out Kocher's point on the right side.  I draped and prepped this area sterilely.  I then made a small incision.  Using the perforator I made a small bur hole.  I did a small corticectomy and then placed an EVD.  I did get bright red blood with CSF at 7 cm from the skull.  I then tunneled this drain.  I closed the small incision with 0  Vicryl's and staples.  Complications: none EBL: 100 mL Disposition:  CT and then ICU.  I was present for the entire surgery

## 2021-08-21 NOTE — ED Notes (Signed)
Activated Code Stroke w/ [facility] per Dr. Beather Arbour

## 2021-08-21 NOTE — Progress Notes (Signed)
Abg already obtained. Called Dr Beather Arbour to confirm results of gas.

## 2021-08-21 NOTE — Anesthesia Postprocedure Evaluation (Signed)
Anesthesia Post Note  Patient: Heidi Torres  Procedure(s) Performed: CRANIOTOMY HEMATOMA EVACUATION SUBDURAL PLACEMENT OF EVP DRAIN (Bilateral: Head)  Patient location during evaluation: PACU Anesthesia Type: General Level of consciousness: sedated and patient remains intubated per anesthesia plan Pain management: pain level controlled Vital Signs Assessment: post-procedure vital signs reviewed and stable Respiratory status: patient on ventilator - see flowsheet for VS and patient remains intubated per anesthesia plan Cardiovascular status: blood pressure returned to baseline and stable Postop Assessment: no apparent nausea or vomiting Anesthetic complications: no   No notable events documented.   Last Vitals:  Vitals:   08/21/21 1200 08/21/21 1300  BP: 116/62 119/63  Pulse: (!) 44 (!) 44  Resp: 12 12  Temp: (!) 33.4 C (!) 33.8 C  SpO2: 100% 100%    Last Pain:  Vitals:   08/21/21 1123  TempSrc: Bladder  PainSc:                  [place] Ouch

## 2021-08-21 NOTE — Progress Notes (Addendum)
Pt to Ed c/o CP and HTN. Upon arrival from CT patient began to have facial droop and slurred speech.  Cart activation: [id number] LKW: Per RN stated 0320 was when symptoms started EDMD Beather Arbour was at bedside at time of episode 0338: BP 205/84 104 BS 0422: Pt to CT 0443: Pt returned from CT 0329" teleNeuro paged [phone]: Henriette Combs, MD connected to cart  Second scan findings: Active hemorrhage  Hilton Cork, RN MSN Telestroke RN

## 2021-08-21 NOTE — Progress Notes (Signed)
Hypertonic saline solution ordered can only be administered through Central access. Patient only has PIVs.Dr. Manuella Ghazi with [facility] made aware.   Report given to Dorothey Baseman, RN's. Report given to [employer]. Hypertonic saline solution being sent with [facility] life flight for patient to receive post central line placement on arrival.

## 2021-08-21 NOTE — Consult Note (Signed)
Referring Physician:  No referring provider defined for this encounter.  Primary Physician:  [name], MD  Chief Complaint:  left ICH   History of Present Illness: Heidi Torres is a 66 y.o. adult who presents with the chief complaint of headaches.  This history is taken from husband, who is bedside as patient is intubated and unable to communicate.    Patient was at her regular good state of health last night and was having chest pain.  She then complained of a very bad headache.  Came to ED and had a negative head CT and then shorty later became comatose.  New head CT showed large left temporal hematoma with 1.5 cm midline shift.  Of note, patient was on anticoagulation for atrial fibrillation and received reversal.  The symptoms are causing a significant impact on the patient's life.   Review of Systems:  A 10 point review of systems is negative, except for the pertinent positives and negatives detailed in the HPI.  Past Medical History: Past Medical History:  Diagnosis Date   Anemia    Angina at rest [facility])    Anxiety    a.) on BZO PRN   Aortic atherosclerosis ([place])    Arthritis    Asthma    Atrial fibrillation ([place])    a.) CHA2DS2-VASc = 4 (age, sex, HTN, aortic plaque). b.) rate/rhythm maintained on oral metoprolol tartrate; chronically anticoagulated using rivaroxaban   CAD (coronary artery disease)    a.) LHC approx 2003/2004 revealed no obstructive CAD.   Cataract cortical, senile    CKD (chronic kidney disease), stage III ([place])    a.) s/p LEFT nephrectomy d/t RCC 76/73/4193   Eosinophilic esophagitis    Esophageal stricture    a.) s/p dilation   GERD (gastroesophageal reflux disease)    Heart murmur    History of hiatal hernia    History of left nephrectomy 02/24/2017   a.) d/t to [facility]   Hx of dysplastic nevus    multiple sites   Hx of squamous cell carcinoma of skin 06/22/2015   R infrascapular, SCC in situ   Hyperlipidemia     Hypertension    Long term (current) use of immunosuppressive biologic    a.) mepolizumab used for asthma and EOE   Long term current use of anticoagulant    a.) rivaroxaban   Ocular migraine    Pneumonia 11/2016   PSVT (paroxysmal supraventricular tachycardia) ([place])    a.) Holter 03/02/2021: 80 PSVT episodes; fastest lasting 7 beats (max 187 bpm), and the longest lasting 16 beats (max 122 bpm)   Pulmonary infection due to Mycobacterium avium-intracellulare (MAI) ([place]) 12/15/2016   Renal cell cancer, left ([place]) 2019   a.) stage III (cT3a, cN0, cM0) clear cell RCC; s/p LEFT nephrectomy 02/24/2017; could not tolerate adjuvant chemotherapy (sorafenib), even with dose reduction, due to GI side effects and effects on thyroid panel.    Past Surgical History: Past Surgical History:  Procedure Laterality Date   ABLATION     prior to hysterectomy   CYSTOSCOPY/RETROGRADE/URETEROSCOPY Left 01/20/2017   Procedure: CYSTOSCOPY/RETROGRADE/URETEROSCOPY/ BIOPSY,BLADDER BIOPSY PLACEMENT STENT LEFT URETER;  Surgeon: Festus Aloe, MD;  Location: [facility] ORS;  Service: Urology;  Laterality: Left;   DILATION AND CURETTAGE OF UTERUS     ESOPHAGOGASTRODUODENOSCOPY N/A 12/16/2019   Procedure: ESOPHAGOGASTRODUODENOSCOPY (EGD);  Surgeon: Lesly Rubenstein, MD;  Location: [facility] ENDOSCOPY;  Service: Endoscopy;  Laterality: N/A;   ESOPHAGOGASTRODUODENOSCOPY (EGD) WITH PROPOFOL N/A 01/09/2019   Procedure: ESOPHAGOGASTRODUODENOSCOPY (EGD)  WITH PROPOFOL;  Surgeon: Toledo, Benay Pike, MD;  Location: [facility] ENDOSCOPY;  Service: Gastroenterology;  Laterality: N/A;   LEFT HEART CATH AND CORONARY ANGIOGRAPHY Left    approximately 2003-2004; notes unavailable for review; results "ok" per patient.   NASAL ENDOSCOPY WITH EPISTAXIS CONTROL     ROBOTIC ASSITED PARTIAL NEPHRECTOMY Left 02/24/2017   Procedure: XI ROBOTIC ASSITED LEFT  RADICAL NEPHRECTOMY;  Surgeon: Alexis Frock, MD;  Location: [facility] ORS;  Service: Urology;   Laterality: Left;   TUBAL LIGATION Bilateral 05/14/1991   VAGINAL HYSTERECTOMY     XI ROBOTIC ASSISTED VENTRAL HERNIA N/A 04/09/2021   Procedure: XI ROBOTIC ASSISTED VENTRAL HERNIA;  Surgeon: Herbert Pun, MD;  Location: [facility] ORS;  Service: General;  Laterality: N/A;    Problem List: Patient Active Problem List   Diagnosis Date Noted   Status post craniotomy 08/21/2021   Chest pain 05/07/2021   CAD (coronary artery disease) 05/07/2021   Elevated troponin 05/07/2021   Chronic kidney disease, stage 3a ([place]) 05/07/2021   Rosacea 05/07/2021   Asthma    Anxiety    Chest pain of uncertain etiology 61/44/3154   Paroxysmal atrial fibrillation ([place]) 05/13/2020   Secondary hypercoagulable state ([place]) 05/13/2020   Atrial fibrillation with RVR ([place]) 02/12/2020   Mixed hyperlipidemia 01/28/2019   Stage 3 chronic kidney disease ([place]) 09/25/2018   Moderate persistent asthma 09/05/2018   Eosinophilia 09/05/2018   Essential hypertension 07/27/2018   Perennial allergic rhinitis with seasonal variation 07/27/2018   Pure hypercholesterolemia 07/27/2018   Chemotherapy induced nausea and vomiting 07/01/2017   Chemotherapy induced diarrhea 07/01/2017   Acute renal insufficiency 07/01/2017   Thyroid dysfunction 07/01/2017   Renal cell carcinoma of left kidney ([place]) 03/21/2017   Cancer of left kidney ([place]) 02/24/2017   Pulmonary infection due to Mycobacterium avium-intracellulare (MAI) ([place]) 12/15/2016   HLD (hyperlipidemia) 12/15/2016   Esophagitis, reflux 10/21/2013    Allergies: Allergies as of 08/21/2021 - Review Complete 08/21/2021  Allergen Reaction Noted   Benzoin  01/11/2017   Ciprofloxacin Other (See Comments) 09/07/2014   Dupilumab Other (See Comments) 04/29/2021   Steri-strip compound benzoin [benzoin compound] Other (See Comments) 01/11/2017      Social History: Social History   Tobacco Use   Smoking status: Never   Smokeless tobacco: Never  Vaping Use   Vaping  Use: Never used  Substance Use Topics   Alcohol use: No   Drug use: Never    Family Medical History: Family History  Problem Relation Age of Onset   Heart disease Mother    Hyperlipidemia Mother    Squamous cell carcinoma Father    Heart disease Father    Hypertension Father    Hypertension Sister    Hypertension Brother    AAA (abdominal aortic aneurysm) Maternal Grandmother    Lung cancer Maternal Grandfather        smoker   Colon cancer Neg Hx    Esophageal cancer Neg Hx    Stomach cancer Neg Hx     Physical Examination: '@VITALWITHPAIN'$ @  General: Patient is intubated and sedated.  Head:  Left pupil is midly reactive to light  ENT:  Oral mucosa appears well hydrated. Respiratory: Intubated  Extremities: No edema.  Vascular: Palpable pulses.  Skin:   On exposed skin, there are no abnormal skin lesions.  NEUROLOGICAL:  General: E1V1TM1  Strength: unable to test reliably due to her receiving paralytic for intubation. Imaging: IMPRESSION: 1. Large hyperacute intra-axial hemorrhage in the left hemisphere, new since 0243 hours today.  EstimatedHematoma volume 151 mL, with pronounced CTA spot sign indicating ACTIVE HEMORRHAGE. Recommend emergent Neurosurgery consultation.   2. Intracranial edema and mass effect including 15 mL rightward midline shift. Early left uncal herniation. Early trapping of the right lateral ventricle.  I have personally reviewed the images and agree with the above interpretation.  Assessment and Plan: Ms. Manygoats is an unfortunate 66 y.o. with large left temporal ICH.  Her prognosis is very poor.  Due to her relatively young age, I do think a surgery may be warranted, though I have clearly told the family that her survival chances are low.  We will plan on doing a left side craniotomy/craniectomy for evacuation of hemorrhage.   I informed family of risk of surgery, which include, but is not limited to infection, bleeding, stroke, seizure  and death. I will take her emergently to the OR.  Redge Gainer MD, PhD

## 2021-08-21 NOTE — Progress Notes (Signed)
Verbal order for hypertonic saline placed and repeated to Abd-El-Barr MD and confirmed correct.

## 2021-08-21 NOTE — ED Notes (Signed)
Returned to ED 04 from CT. Patient now with left pupil > right.

## 2021-08-21 NOTE — ED Notes (Signed)
RT called to notify of ABG order.

## 2021-08-21 NOTE — ED Notes (Signed)
Patient AOX4. Resp even, unlabored on RA. Ambulated from stretcher to bathroom. Continent of urine and ambulated back to bed. Placed on cardiac monitor. Reports headache.

## 2021-08-21 NOTE — ED Notes (Signed)
Report given to [place], [place] RN

## 2021-08-21 NOTE — ED Notes (Signed)
Patient with new onset of slurred speech, facial droop and right sided weakness. Following commands.

## 2021-08-21 NOTE — Anesthesia Preprocedure Evaluation (Addendum)
Anesthesia Evaluation  Patient identified by MRN, date of birth, ID band Patient unresponsive    Reviewed: Allergy & Precautions, Patient's Chart, lab work & pertinent test resultsPreop documentation limited or incomplete due to emergent nature of procedure.  Airway Mallampati: Unable to assess       Dental   ETT:   Pulmonary asthma ,  Acute Hypoxic Respiratory Failure secondary to Acute Encephalopathy in the setting of Acute ICH   Pulmonary exam normal  + decreased breath sounds      Cardiovascular hypertension, Pt. on medications + CAD  Normal cardiovascular exam+ dysrhythmias Atrial Fibrillation  Rhythm:Regular Rate:Normal  Hypertensive Urgency Small Pericardial Effusion PMHx: PAF, CAD, HCM   Neuro/Psych  Headaches, Anxiety negative neurological ROS  negative psych ROS   GI/Hepatic Neg liver ROS, hiatal hernia,   Endo/Other  negative endocrine ROS  Renal/GU Renal InsufficiencyRenal diseaseS/P l Nephrectomy     Musculoskeletal   Abdominal Normal abdominal exam  (+)   Peds  Hematology  (+) Blood dyscrasia, anemia ,   Anesthesia Other Findings  Ed c/o CP and HTN. Upon arrival from CT patient began to have facial droop and slurred speech. Pt found to have subdural hematoma with mass effect. Pt on apixaban and was reversed with antidote.      Past Medical History: No date: Anemia No date: Angina at rest [facility]) No date: Anxiety     Comment:  a.) on BZO PRN No date: Aortic atherosclerosis (HCC) No date: Arthritis No date: Asthma No date: Atrial fibrillation (HCC)     Comment:  a.) CHA2DS2-VASc = 4 (age, sex, HTN, aortic plaque). b.)              rate/rhythm maintained on oral metoprolol tartrate;               chronically anticoagulated using rivaroxaban No date: CAD (coronary artery disease)     Comment:  a.) LHC approx 2003/2004 revealed no obstructive CAD. No date: Cataract cortical, senile No date: CKD  (chronic kidney disease), stage III ([place])     Comment:  a.) s/p LEFT nephrectomy d/t RCC 02/24/2017 No date: Eosinophilic esophagitis No date: Esophageal stricture     Comment:  a.) s/p dilation No date: GERD (gastroesophageal reflux disease) No date: Heart murmur No date: History of hiatal hernia 02/24/2017: History of left nephrectomy     Comment:  a.) d/t to RCC Dx No date: Hx of dysplastic nevus     Comment:  multiple sites 06/22/2015: Hx of squamous cell carcinoma of skin     Comment:  R infrascapular, SCC in situ No date: Hyperlipidemia No date: Hypertension No date: Long term (current) use of immunosuppressive biologic     Comment:  a.) mepolizumab used for asthma and EOE No date: Long term current use of anticoagulant     Comment:  a.) rivaroxaban No date: Ocular migraine 11/2016: Pneumonia No date: PSVT (paroxysmal supraventricular tachycardia) ([place])     Comment:  a.) Holter 03/02/2021: 80 PSVT episodes; fastest lasting              7 beats (max 187 bpm), and the longest lasting 16 beats               (max 122 bpm) 12/15/2016: 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  Past Surgical History: No date: ABLATION     Comment:  prior to hysterectomy 01/20/2017: CYSTOSCOPY/RETROGRADE/URETEROSCOPY; Left     Comment:  Procedure: CYSTOSCOPY/RETROGRADE/URETEROSCOPY/               BIOPSY,BLADDER BIOPSY PLACEMENT STENT LEFT URETER;                Surgeon: Festus Aloe, MD;  Location: [facility];                Service: Urology;  Laterality: Left; No date: DILATION AND CURETTAGE OF UTERUS 12/16/2019: ESOPHAGOGASTRODUODENOSCOPY; N/A     Comment:   Procedure: ESOPHAGOGASTRODUODENOSCOPY (EGD);  Surgeon:               Lesly Rubenstein, MD;  Location: [facility] ENDOSCOPY;                Service: Endoscopy;  Laterality: N/A; 01/09/2019: ESOPHAGOGASTRODUODENOSCOPY (EGD) WITH PROPOFOL; N/A     Comment:  Procedure: ESOPHAGOGASTRODUODENOSCOPY (EGD) WITH               PROPOFOL;  Surgeon: Toledo, Benay Pike, MD;  Location:               [facility] ENDOSCOPY;  Service: Gastroenterology;  Laterality:               N/A; No date: LEFT HEART CATH AND CORONARY ANGIOGRAPHY; Left     Comment:  approximately 2003-2004; notes unavailable for review;               results "ok" per patient. No date: NASAL ENDOSCOPY WITH EPISTAXIS CONTROL 02/24/2017: ROBOTIC ASSITED PARTIAL NEPHRECTOMY; Left     Comment:  Procedure: XI ROBOTIC ASSITED LEFT  RADICAL NEPHRECTOMY;              Surgeon: Alexis Frock, MD;  Location: [facility];                Service: Urology;  Laterality: Left; 05/14/1991: TUBAL LIGATION; Bilateral No date: VAGINAL HYSTERECTOMY  BMI    Body Mass Index: 26.22 kg/m      Reproductive/Obstetrics negative OB ROS                           Anesthesia Physical  Anesthesia Plan  ASA: 5 and emergent  Anesthesia Plan: General   Post-op Pain Management:    Induction: Inhalational  PONV Risk Score and Plan: 1 and Ondansetron, Dexamethasone and Treatment may vary due to age or medical condition  Airway Management Planned: Oral ETT  Additional Equipment: Arterial line  Intra-op Plan:   Post-operative Plan: Extubation in OR  Informed Consent: I have reviewed the patients History and Physical, chart, labs and discussed the procedure including the risks, benefits and alternatives for the proposed anesthesia with the patient or authorized representative who has indicated his/her understanding and acceptance.     History available from chart only  Plan Discussed with: CRNA, Surgeon and Anesthesiologist  Anesthesia Plan  Comments:         Anesthesia Quick Evaluation

## 2021-08-21 NOTE — ED Triage Notes (Addendum)
Pt states she has chest pain and hypertension. Pt with history of a fib. Pt states states she took a metoprolol '25mg'$  at 0140. Pt states she has a "pounding headache". Pt relates "my blood pressure at home was so high it wouldn't even read". Pt is on reported anticoagulation medication.

## 2021-08-21 NOTE — Progress Notes (Signed)
Arterial and cuff pressures not co-relating. Per Dr. Dava Najjar use cuff pressures for blood pressure measurements.

## 2021-08-21 NOTE — Progress Notes (Signed)
   08/21/21 0700  Clinical Encounter Type  Visited With Patient and family together  Visit Type Follow-up  Referral From Chaplain  Consult/Referral To [facility] provided support to husband and daughter and grandchildren. Chaplain prayed with family before patient went into surgery. Chaplain brought family to ICU waiting room where they will wait for patient during her surgery. Chaplain will follow up as needed.

## 2021-08-21 NOTE — ED Notes (Signed)
PT in CT then XR

## 2021-08-21 NOTE — H&P (Signed)
Heidi East, MD Physician Neurosurgery Consult Note    Signed Date of Service:  08/21/2021 12:31 PM   Signed                                                                                                                                                        Referring Physician:  No referring provider defined for this encounter.   Primary Physician:  Heidi Body, MD   Chief Complaint:  left ICH        History of Present Illness: Heidi Torres is a 66 y.o. adult who presents with the chief complaint of headaches.  This history is taken from husband, who is bedside as patient is intubated and unable to communicate.     Patient was at her regular good state of health last night and was having chest pain.  She then complained of a very bad headache.  Came to ED and had a negative head CT and then shorty later became comatose.  New head CT showed large left temporal hematoma with 1.5 cm midline shift.   Of note, patient was on anticoagulation for atrial fibrillation and received reversal.   The symptoms are causing a significant impact on the patient's life.    Review of Systems:  A 10 point review of systems is negative, except for the pertinent positives and negatives detailed in the HPI.   Past Medical History:     Past Medical History:  Diagnosis Date   Anemia     Angina at rest [facility])     Anxiety      a.) on BZO PRN   Aortic atherosclerosis ([place])     Arthritis     Asthma     Atrial fibrillation ([place])      a.) CHA2DS2-VASc = 4 (age, sex, HTN, aortic plaque). b.) rate/rhythm maintained on oral metoprolol tartrate; chronically anticoagulated using rivaroxaban   CAD (coronary artery disease)      a.) LHC approx 2003/2004 revealed no obstructive CAD.   Cataract cortical, senile     CKD (chronic kidney disease), stage III ([place])      a.) s/p LEFT nephrectomy d/t RCC 78/58/8502   Eosinophilic esophagitis      Esophageal stricture      a.) s/p dilation   GERD (gastroesophageal reflux disease)     Heart murmur     History of hiatal hernia     History of left nephrectomy 02/24/2017    a.) d/t to [facility] Dx   Hx of dysplastic nevus      multiple sites   Hx of squamous cell carcinoma of skin 06/22/2015    R infrascapular, SCC in situ   Hyperlipidemia     Hypertension     Long term (current) use of immunosuppressive biologic      a.)  mepolizumab used for asthma and EOE   Long term current use of anticoagulant      a.) rivaroxaban   Ocular migraine     Pneumonia 11/2016   PSVT (paroxysmal supraventricular tachycardia) (HCC)      a.) Holter 03/02/2021: 80 PSVT episodes; fastest lasting 7 beats (max 187 bpm), and the longest lasting 16 beats (max 122 bpm)   Pulmonary infection due to Mycobacterium avium-intracellulare (MAI) ([place]) 12/15/2016   Renal cell cancer, left ([place]) 2019    a.) stage III (cT3a, cN0, cM0) clear cell RCC; s/p LEFT nephrectomy 02/24/2017; could not tolerate adjuvant chemotherapy (sorafenib), even with dose reduction, due to GI side effects and effects on thyroid panel.      Past Surgical History:      Past Surgical History:  Procedure Laterality Date   ABLATION        prior to hysterectomy   CYSTOSCOPY/RETROGRADE/URETEROSCOPY Left 01/20/2017    Procedure: CYSTOSCOPY/RETROGRADE/URETEROSCOPY/ BIOPSY,BLADDER BIOPSY PLACEMENT STENT LEFT URETER;  Surgeon: Festus Aloe, MD;  Location: [facility];  Service: Urology;  Laterality: Left;   DILATION AND CURETTAGE OF UTERUS       ESOPHAGOGASTRODUODENOSCOPY N/A 12/16/2019    Procedure: ESOPHAGOGASTRODUODENOSCOPY (EGD);  Surgeon: Lesly Rubenstein, MD;  Location: [facility] ENDOSCOPY;  Service: Endoscopy;  Laterality: N/A;   ESOPHAGOGASTRODUODENOSCOPY (EGD) WITH PROPOFOL N/A 01/09/2019    Procedure: ESOPHAGOGASTRODUODENOSCOPY (EGD) WITH PROPOFOL;  Surgeon: Toledo, Benay Pike, MD;  Location: [facility] ENDOSCOPY;  Service: Gastroenterology;   Laterality: N/A;   LEFT HEART CATH AND CORONARY ANGIOGRAPHY Left      approximately 2003-2004; notes unavailable for review; results "ok" per patient.   NASAL ENDOSCOPY WITH EPISTAXIS CONTROL       ROBOTIC ASSITED PARTIAL NEPHRECTOMY Left 02/24/2017    Procedure: XI ROBOTIC ASSITED LEFT  RADICAL NEPHRECTOMY;  Surgeon: Alexis Frock, MD;  Location: [facility];  Service: Urology;  Laterality: Left;   TUBAL LIGATION Bilateral 05/14/1991   VAGINAL HYSTERECTOMY       XI ROBOTIC ASSISTED VENTRAL HERNIA N/A 04/09/2021    Procedure: XI ROBOTIC ASSISTED VENTRAL HERNIA;  Surgeon: Herbert Pun, MD;  Location: [facility] ORS;  Service: General;  Laterality: N/A;      Problem List:     Patient Active Problem List    Diagnosis Date Noted   Status post craniotomy 08/21/2021   Chest pain 05/07/2021   CAD (coronary artery disease) 05/07/2021   Elevated troponin 05/07/2021   Chronic kidney disease, stage 3a ([place]) 05/07/2021   Rosacea 05/07/2021   Asthma     Anxiety     Chest pain of uncertain etiology 16/96/7893   Paroxysmal atrial fibrillation ([place]) 05/13/2020   Secondary hypercoagulable state ([place]) 05/13/2020   Atrial fibrillation with RVR ([place]) 02/12/2020   Mixed hyperlipidemia 01/28/2019   Stage 3 chronic kidney disease ([place]) 09/25/2018   Moderate persistent asthma 09/05/2018   Eosinophilia 09/05/2018   Essential hypertension 07/27/2018   Perennial allergic rhinitis with seasonal variation 07/27/2018   Pure hypercholesterolemia 07/27/2018   Chemotherapy induced nausea and vomiting 07/01/2017   Chemotherapy induced diarrhea 07/01/2017   Acute renal insufficiency 07/01/2017   Thyroid dysfunction 07/01/2017   Renal cell carcinoma of left kidney ([place]) 03/21/2017   Cancer of left kidney ([place]) 02/24/2017   Pulmonary infection due to Mycobacterium avium-intracellulare (MAI) ([place]) 12/15/2016   HLD (hyperlipidemia) 12/15/2016   Esophagitis, reflux 10/21/2013      Allergies:      Allergies as  of 08/21/2021 - Review Complete 08/21/2021  Allergen Reaction Noted  Benzoin   01/11/2017   Ciprofloxacin Other (See Comments) 09/07/2014   Dupilumab Other (See Comments) 04/29/2021   Steri-strip compound benzoin [benzoin compound] Other (See Comments) 01/11/2017          Social History: Social History        Tobacco Use   Smoking status: Never   Smokeless tobacco: Never  Vaping Use   Vaping Use: Never used  Substance Use Topics   Alcohol use: No   Drug use: Never      Family Medical History:      Family History  Problem Relation Age of Onset   Heart disease Mother     Hyperlipidemia Mother     Squamous cell carcinoma Father     Heart disease Father     Hypertension Father     Hypertension Sister     Hypertension Brother     AAA (abdominal aortic aneurysm) Maternal Grandmother     Lung cancer Maternal Grandfather          smoker   Colon cancer Neg Hx     Esophageal cancer Neg Hx     Stomach cancer Neg Hx        Physical Examination: '@VITALWITHPAIN'$ @   General:          Patient is intubated and sedated.   Head:               Left pupil is midly reactive to light   ENT:                Oral mucosa appears well hydrated. Respiratory:     Intubated   Extremities:     No edema.   Vascular:         Palpable pulses.   Skin:                On exposed skin, there are no abnormal skin lesions.   NEUROLOGICAL:  General: [id number]   Strength: unable to test reliably due to her receiving paralytic for intubation. Imaging: IMPRESSION: 1. Large hyperacute intra-axial hemorrhage in the left hemisphere, new since 0243 hours today. EstimatedHematoma volume 151 mL, with pronounced CTA spot sign indicating ACTIVE HEMORRHAGE. Recommend emergent Neurosurgery consultation.   2. Intracranial edema and mass effect including 15 mL rightward midline shift. Early left uncal herniation. Early trapping of the right lateral ventricle.   I have personally reviewed the images  and agree with the above interpretation.   Assessment and Plan: Ms. Neuharth is an unfortunate 67 y.o. with large left temporal ICH.  Her prognosis is very poor.  Due to her relatively young age, I do think a surgery may be warranted, though I have clearly told the family that her survival chances are low.  We will plan on doing a left side craniotomy/craniectomy for evacuation of hemorrhage.   I informed family of risk of surgery, which include, but is not limited to infection, bleeding, stroke, seizure and death. I will take her emergently to the OR.   Redge Gainer MD, PhD              Routing History                  Note Details  Author Heidi East, MD File Time 08/21/2021 12:45 PM  Author Type Physician Status Signed  Last Editor Heidi Torres, [place] # [phone] [place] Date 08/21/2021

## 2021-08-21 NOTE — ED Notes (Signed)
Pharmacy called regarding Andexxa. State they are still mixing drug and will bring up to ED as soon as they are done.

## 2021-08-21 NOTE — Consult Note (Addendum)
[facility] TeleNeurology Consult Services   Patient Name:   Heidi Torres, Heidi Torres Date of Birth:   Oct 22, 1955 Identification Number:   MRN - [id number] Date of Service:   08/21/2021 49:70:26  Diagnosis:       I63.9 - Cerebrovascular accident (CVA), unspecified mechanism ([place])  Impression: 66 yo female with history of HTN, HLD, DM, afib on Xarelto, CAD, migraines presenting to the ED with severe headache and chest pain then developed witnessed sudden onset of global aphasia and Right hemiplegia. CT Head negative for acute hemorrhage. Concern for acute stroke, but pertinent to r/o dissection given her history. Needs STAT CTA head/neck and chest for further evaluation, will follow-up. Repeat CT Head shows large Left subcortical hemorrhage. SBP goal < 140. STAT neurosurgery consult. Xarelto reversal.  Advanced Imaging:  CTA Head and Neck Completed.  LVO:No  Positive spot sign concerning for active hemorrhage.  Recommendation:  Diagnostic Studies:      Repeat CT head in first 8-12hrs  Laboratory Studies:       INR/PT       aPTT?       CBC  Medications:       Hold?antiplatelet?therapy/NSAIDS/Anticoagulation       Warfarin/Coumadin/DOAC reversal per hospital protocol  Nursing Recommendations:       Telemetry, IV Fluids?Avoid dextrose containing fluids, Maintain euglycemia       Head of bed 30 degrees       Neuro checks q1-2?hrs?during ICU stay       Once stable neuro checks q4?hrs       keep BP less than 140/90's with goal of 130/80s  Consultations:       Need Neurosurgery consultation?STAT       Recommend Speech therapy if failed dysphagia screen       Physical therapy/Occupational therapy  DVT Prophylaxis:       SCDs  Disposition:       Neurology will Follow  ------------------------------------------------------------------------------  Metrics: Last Known Well: 08/21/2021 03:20:00 [facility] Notification Time: 08/21/2021 37:85:88 Stamp Time:  08/21/2021 50:27:74 Initial Response Time: 08/21/2021 03:37:09 Symptoms: chest pain, headache, slurred speech, Right sided weakness. Initial patient interaction: 08/21/2021 03:39:07 NIHSS Assessment Completed: 08/21/2021 03:45:10 Patient is not a candidate for Thrombolytic. Thrombolytic Medical Decision: 08/21/2021 03:45:10 Patient was not deemed candidate for Thrombolytic because of following reasons: Use of NOAs within 48 hours.  CT head showed no acute hemorrhage or acute core infarct.  Primary Provider Notified of Diagnostic Impression and Management Plan on: 08/21/2021 04:40:25    History of Present Illness: Patient is a 66 year old Female.  Patient presenting from home with chest pain, pounding headache and severe hypertension at home. While she was in the ED she developed sudden onset of slurred speech that progressed to aphasia and Right hemiplegia at ~0320. She is having intractable vomiting as well.    Past Medical History:      Hypertension      Diabetes Mellitus      Hyperlipidemia      Atrial Fibrillation      Coronary Artery Disease      Migraine Headaches  Medications:  Anticoagulant use:  Yes Xarelto No Antiplatelet use Reviewed EMR for current medications  Allergies:  Reviewed  Social History: Smoking: No Alcohol Use: No Drug Use: No  Family History:  There is no family history of premature cerebrovascular disease pertinent to this consultation  ROS : 14 Points Review of Systems was performed and was negative except mentioned in HPI.  Past Surgical History:  There Is No Surgical History Contributory To Today's Visit     Examination: BP(201/84), Pulse(70), 1A: Level of Consciousness - Arouses to minor stimulation + 1 1B: Ask Month and Age - Aphasic + 2 1C: Blink Eyes & Squeeze Hands - Performs 0 Tasks + 2 2: Test Horizontal Extraocular Movements - Partial Gaze Palsy: Can Be Overcome + 1 3: Test Visual Fields - No Visual Loss + 0 4: Test  Facial Palsy (Use [place]imace if Obtunded) - Partial paralysis (lower face) + 2 5A: Test Left Arm Motor Drift - No Drift for 10 Seconds + 0 5B: Test Right Arm Motor Drift - No Movement + 4 6A: Test Left Leg Motor Drift - No Drift for 5 Seconds + 0 6B: Test Right Leg Motor Drift - No Effort Against Gravity + 3 7: Test Limb Ataxia (FNF/Heel-Shin) - Does Not Understand + 0 8: Test Sensation - Normal; No sensory loss + 0 9: Test Language/Aphasia - Mute/Global Aphasia: No Usable Speech/Auditory Comprehension + 3 10: Test Dysarthria - Mute/Anarthric + 2 11: Test Extinction/Inattention - No abnormality + 0 NIHSS Score: 20  ICH Score: 2   GlasGow Coma Score: 5-12 (+1)  Age >= 80: No (0)  ICH volume >= 65m: Yes (+1)  Intraventricular hemorrhage: No (0)  Infratentorial origin of hemorrhage: No (0)  Pre-Morbid Modified Rankin Scale: 0 Points = No symptoms at all   Patient/Family was informed the Neurology Consult would occur via TeleHealth consult by way of interactive audio and video telecommunications and consented to receiving care in this manner.   Dr MJaneece Agee [facility] For Inpatient follow-up with [facility] physician please call [facility] [phone] This is not an outpatient service. Post hospital discharge, please contact hospital directly.

## 2021-08-21 NOTE — OR Nursing (Signed)
Patient has left the operating room to have a CT completed. Surgeon states there is a low likelihood she will need to come back to the OR, but will not no for sure until CT scan is complete. Thus is considered and intraop-scan not a operative bring back

## 2021-08-21 NOTE — ED Notes (Signed)
Patient with continued vomiting after zofran administration, unable to protect airway. Dr. Beather Arbour at bedside. RT called to notify that patient is to be intubated.

## 2021-08-21 NOTE — Transfer of Care (Signed)
Immediate Anesthesia Transfer of Care Note  Patient: Heidi Torres  Procedure(s) Performed: CRANIOTOMY HEMATOMA EVACUATION SUBDURAL (Left: Head)  Patient Location: PACU and ICU  Anesthesia Type:General  Level of Consciousness: sedated  Airway & Oxygen Therapy: Patient remains intubated per anesthesia plan  Post-op Assessment: Report given to RN  Post vital signs: stable    Last Vitals:  Vitals Value Taken Time  BP 138/65 08/21/21 1130  Temp 33.4 C 08/21/21 1142  Pulse 48 08/21/21 1142  Resp 17 08/21/21 1142  SpO2 100 % 08/21/21 1142  Vitals shown include unvalidated device data.  Last Pain:  Vitals:   08/21/21 1123  TempSrc: Bladder  PainSc:          Complications: No notable events documented.

## 2021-08-21 NOTE — ED Provider Notes (Signed)
[facility] Provider Note    Event Date/Time   First MD Initiated Contact with Patient 08/21/21 0241     (approximate)   History   Chest Pain   HPI  Heidi Torres is a 65 y.o. adult who presents to the ED from home with a chief complaint of chest pain, headache and elevated blood pressure.  Patient with a history of atrial fibrillation on Xarelto who titrated off amiodarone last week secondary to side effects.  Started to experience chest pain this evening and noted elevated blood pressure.  Called her physician line and was instructed to take an extra metoprolol 25 mg which she did at 1:40 AM.  Denies fever, cough, shortness of breath, abdominal pain, vomiting or dizziness.  Endorses nausea.  No other changes to her medications.  Complains of headache.  Denies vision changes, slurred speech, facial droop, extremity weakness/numbness/tingling.     Past Medical History   Past Medical History:  Diagnosis Date   Anemia    Angina at rest [facility])    Anxiety    a.) on BZO PRN   Aortic atherosclerosis ([place])    Arthritis    Asthma    Atrial fibrillation ([place])    a.) CHA2DS2-VASc = 4 (age, sex, HTN, aortic plaque). b.) rate/rhythm maintained on oral metoprolol tartrate; chronically anticoagulated using rivaroxaban   CAD (coronary artery disease)    a.) LHC approx 2003/2004 revealed no obstructive CAD.   Cataract cortical, senile    CKD (chronic kidney disease), stage III ([place])    a.) s/p LEFT nephrectomy d/t RCC 40/08/6759   Eosinophilic esophagitis    Esophageal stricture    a.) s/p dilation   GERD (gastroesophageal reflux disease)    Heart murmur    History of hiatal hernia    History of left nephrectomy 02/24/2017   a.) d/t to [facility] Dx   Hx of dysplastic nevus    multiple sites   Hx of squamous cell carcinoma of skin 06/22/2015   R infrascapular, SCC in situ   Hyperlipidemia    Hypertension    Long term (current) use of immunosuppressive biologic     a.) mepolizumab used for asthma and EOE   Long term current use of anticoagulant    a.) rivaroxaban   Ocular migraine    Pneumonia 11/2016   PSVT (paroxysmal supraventricular tachycardia) ([place])    a.) Holter 03/02/2021: 80 PSVT episodes; fastest lasting 7 beats (max 187 bpm), and the longest lasting 16 beats (max 122 bpm)   Pulmonary infection due to Mycobacterium avium-intracellulare (MAI) ([place]) 12/15/2016   Renal cell cancer, left ([place]) 2019   a.) stage III (cT3a, cN0, cM0) clear cell RCC; s/p LEFT nephrectomy 02/24/2017; could not tolerate adjuvant chemotherapy (sorafenib), even with dose reduction, due to GI side effects and effects on thyroid panel.     Active Problem List   Patient Active Problem List   Diagnosis Date Noted   Chest pain 05/07/2021   CAD (coronary artery disease) 05/07/2021   Elevated troponin 05/07/2021   Chronic kidney disease, stage 3a ([place]) 05/07/2021   Rosacea 05/07/2021   Asthma    Anxiety    Chest pain of uncertain etiology 95/09/3265   Paroxysmal atrial fibrillation ([place]) 05/13/2020   Secondary hypercoagulable state ([place]) 05/13/2020   Atrial fibrillation with RVR ([place]) 02/12/2020   Mixed hyperlipidemia 01/28/2019   Stage 3 chronic kidney disease ([place]) 09/25/2018   Moderate persistent asthma 09/05/2018   Eosinophilia 09/05/2018   Essential  hypertension 07/27/2018   Perennial allergic rhinitis with seasonal variation 07/27/2018   Pure hypercholesterolemia 07/27/2018   Chemotherapy induced nausea and vomiting 07/01/2017   Chemotherapy induced diarrhea 07/01/2017   Acute renal insufficiency 07/01/2017   Thyroid dysfunction 07/01/2017   Renal cell carcinoma of left kidney ([place]) 03/21/2017   Cancer of left kidney ([place]) 02/24/2017   Pulmonary infection due to Mycobacterium avium-intracellulare (MAI) ([place]) 12/15/2016   HLD (hyperlipidemia) 12/15/2016   Esophagitis, reflux 10/21/2013     Past Surgical History   Past Surgical History:   Procedure Laterality Date   ABLATION     prior to hysterectomy   CYSTOSCOPY/RETROGRADE/URETEROSCOPY Left 01/20/2017   Procedure: CYSTOSCOPY/RETROGRADE/URETEROSCOPY/ BIOPSY,BLADDER BIOPSY PLACEMENT STENT LEFT URETER;  Surgeon: Festus Aloe, MD;  Location: [facility];  Service: Urology;  Laterality: Left;   DILATION AND CURETTAGE OF UTERUS     ESOPHAGOGASTRODUODENOSCOPY N/A 12/16/2019   Procedure: ESOPHAGOGASTRODUODENOSCOPY (EGD);  Surgeon: Lesly Rubenstein, MD;  Location: [facility] END[facility]COPY;  Service: Endoscopy;  Laterality: N/A;   ESOPHAGOGASTRODUODENOSCOPY (EGD) WITH PROPOFOL N/A 01/09/2019   Procedure: ESOPHAGOGASTRODUODENOSCOPY (EGD) WITH PROPOFOL;  Surgeon: Toledo, Benay Pike, MD;  Location: [facility] ENDOSCOPY;  Service: Gastroenterology;  Laterality: N/A;   LEFT HEART CATH AND CORONARY ANGIOGRAPHY Left    approximately 2003-2004; notes unavailable for review; results "ok" per patient.   NASAL ENDOSCOPY WITH EPISTAXIS CONTROL     ROBOTIC ASSITED PARTIAL NEPHRECTOMY Left 02/24/2017   Procedure: XI ROBOTIC ASSITED LEFT  RADICAL NEPHRECTOMY;  Surgeon: Alexis Frock, MD;  Location: [facility];  Service: Urology;  Laterality: Left;   TUBAL LIGATION Bilateral 05/14/1991   VAGINAL HYSTERECTOMY     XI ROBOTIC ASSISTED VENTRAL HERNIA N/A 04/09/2021   Procedure: XI ROBOTIC ASSISTED VENTRAL HERNIA;  Surgeon: Herbert Pun, MD;  Location: [facility] ORS;  Service: General;  Laterality: N/A;     Home Medications   Prior to Admission medications   Medication Sig Start Date End Date Taking? Authorizing Provider  albuterol (PROVENTIL) (2.5 MG/3ML) 0.083% nebulizer solution Take 2.5 mg by nebulization every 6 (six) hours as needed for wheezing or shortness of breath.    [provider]  albuterol (VENTOLIN HFA) 108 (90 Base) MCG/ACT inhaler Inhale 2 puffs into the lungs every 4 (four) hours as needed for wheezing or shortness of breath.    [provider]  ALPRAZolam Duanne Moron) 0.25 MG  tablet Take 0.25 mg by mouth at bedtime as needed for anxiety. 08/11/14   [provider]  atorvastatin (LIPITOR) 40 MG tablet Take 40 mg by mouth every morning. 09/26/18   [provider]  Cholecalciferol (VITAMIN D3) 1000 units CAPS Take 1,000 Units by mouth daily.    [provider]  diltiazem (CARDIZEM) 30 MG tablet Take 1 tablet (30 mg total) by mouth as needed (rapid HR/a fib). 05/08/21 05/08/22  Geradine Girt, DO  doxycycline (VIBRAMYCIN) 50 MG capsule TAKE 1 CAPSULE (50 MG TOTAL) BY MOUTH EVERY EVENING. TAKE WITH FOOD 06/21/21   Ralene Bathe, MD  EPINEPHrine 0.3 mg/0.3 mL IJ SOAJ injection Inject 0.3 mg into the muscle as needed for anaphylaxis. 03/08/21   Althea Charon, FNP  fluticasone (FLOVENT HFA) 110 MCG/ACT inhaler INHALE 2 PUFFS TWICE A DAY WITH SPACER TO HELP PREVENT COUGH AND WHEEZE. RINSE MOUTH AFTER USE. 07/12/21   Althea Charon, FNP  magnesium 30 MG tablet Take 30 mg by mouth daily.    [provider]  Mepolizumab (NUCALA) 100 MG/ML SOAJ Inject 1 mL (100 mg total) into the skin every  28 (twenty-eight) days. 03/11/21   Valentina Shaggy, MD  NONFORMULARY OR COMPOUNDED ITEM Apply 1 application. topically in the morning and at bedtime. Ivermectin, Azelaic Acid, Metronidazole - Rosacea Cream    [provider]  olmesartan (BENICAR) 20 MG tablet Take 20 mg by mouth every morning.    [provider]  potassium chloride (MICRO-K) 10 MEQ CR capsule Take 20 mEq by mouth daily. 01/23/17   [provider]  rivaroxaban (XARELTO) 20 MG TABS tablet Take 20 mg by mouth daily with breakfast. 02/20/20   [provider]  triamterene-hydrochlorothiazide (DYAZIDE) 37.5-25 MG per capsule Take 1 capsule by mouth every morning. 07/10/14   [provider]  zinc gluconate 50 MG tablet Take 50 mg by mouth daily.    [provider]     Allergies  Benzoin, Ciprofloxacin, Dupilumab, and Steri-strip compound benzoin  [benzoin compound]   Family History   Family History  Problem Relation Age of Onset   Heart disease Mother    Hyperlipidemia Mother    Squamous cell carcinoma Father    Heart disease Father    Hypertension Father    Hypertension Sister    Hypertension Brother    AAA (abdominal aortic aneurysm) Maternal Grandmother    Lung cancer Maternal Grandfather        smoker   Colon cancer Neg Hx    Esophageal cancer Neg Hx    Stomach cancer Neg Hx      Physical Exam  Triage Vital Signs: ED Triage Vitals  Enc Vitals Group     BP 08/21/21 0227 (!) 208/78     Pulse Rate 08/21/21 0226 66     Resp 08/21/21 0226 16     Temp 08/21/21 0226 98.1 F (36.7 C)     Temp Source 08/21/21 0226 Oral     SpO2 08/21/21 0226 99 %     Weight 08/21/21 0224 160 lb (72.6 kg)     Height 08/21/21 0224 '5\' 6"'$  (1.676 m)     Head Circumference --      Peak Flow --      Pain Score 08/21/21 0224 8     Pain Loc --      Pain Edu? --      Excl. in [place]? --     Updated Vital Signs: BP 131/66   Pulse (!) 49   Temp (!) 93 F (33.9 C)   Resp (!) 21   Ht '5\' 6"'$  (1.676 m)   Wt 72.6 kg   LMP  (LMP Unknown)   SpO2 100%   BMI 25.82 kg/m    General: Awake, no distress.  CV:  RRR.  Good peripheral perfusion.  Resp:  Normal effort.  CTAB. Abd:  Nontender to light or deep palpation.  No distention.  Other:  Alert and oriented x3.  CN II-XII grossly intact.  5/5 motor strength and sensation all extremities. MAEx4.   ED Results / Procedures / Treatments  Labs (all labs ordered are listed, but only abnormal results are displayed) Labs Reviewed  BASIC METABOLIC PANEL - Abnormal; Notable for the following components:      Result Value   Potassium 3.3 (*)    Glucose, Bld 109 (*)    BUN 36 (*)    Creatinine, Ser 1.33 (*)    GFR, Estimated 44 (*)    All other components within normal limits  PROTIME-INR - Abnormal; Notable for the following components:   Prothrombin Time 20.3 (*)  INR 1.8 (*)    All  other components within normal limits  URINALYSIS, ROUTINE W REFLEX MICROSCOPIC - Abnormal; Notable for the following components:   Color, Urine YELLOW (*)    APPearance CLEAR (*)    Hgb urine dipstick SMALL (*)    Protein, ur 30 (*)    Leukocytes,Ua TRACE (*)    All other components within normal limits  SODIUM - Abnormal; Notable for the following components:   Sodium 130 (*)    All other components within normal limits  BLOOD GAS, ARTERIAL - Abnormal; Notable for the following components:   pO2, Arterial 219 (*)    Acid-base deficit 2.3 (*)    All other components within normal limits  CBG MONITORING, ED - Abnormal; Notable for the following components:   Glucose-Capillary 104 (*)    All other components within normal limits  RESP PANEL BY RT-PCR (FLU A&B, COVID) ARPGX2  CBC  ETHANOL  APTT  URINE DRUG SCREEN, QUALITATIVE ([facility] ONLY)  SODIUM  SODIUM  SODIUM  PREPARE RBC (CROSSMATCH)  PREPARE PLATELET PHERESIS  TYPE AND SCREEN  TROPONIN I (HIGH SENSITIVITY)  TROPONIN I (HIGH SENSITIVITY)     EKG  ED ECG REPORT I, Carvell Hoeffner J, the attending physician, personally viewed and interpreted this ECG.   Date: 08/21/2021  EKG Time: 0226  Rate: 66  Rhythm: normal sinus rhythm  Axis: LAD  Intervals:left anterior fascicular block  ST&T Change: Nonspecific    RADIOLOGY I have independently visualized and interpreted patient's x-rays and CT scans as well as noted the radiology interpretation:  Chest x-ray: No acute cardiopulmonary process  Repeat chest x-ray: ET tube in good location  CT head: No ICH  CTA head/neck: Hyperacute left intra-axial hemorrhage left hemisphere, midline shift, edema, early left uncal herniation  CTA chest/abdomen/pelvis: Negative for dissection  Official radiology report(s): CT Angio Chest/Abd/Pel for Dissection W and/or W/WO  Addendum Date: 08/21/2021   ADDENDUM REPORT: 08/21/2021 05:38 ADDENDUM: Salient findings discussed by telephone  with Dr. Leonides Schanz on 08/21/2021 at 0526 hours. Electronically Signed   By: Genevie Ann M.D.   On: 08/21/2021 05:38   Result Date: 08/21/2021 CLINICAL DATA:  66 year old female with sudden severe headache. Hypertensive. Severe acute intracranial hemorrhage. EXAM: CT ANGIOGRAPHY CHEST, ABDOMEN AND PELVIS TECHNIQUE: Non-contrast CT of the chest was initially obtained. Multidetector CT imaging through the chest, abdomen and pelvis was performed using the standard protocol during bolus administration of intravenous contrast. Multiplanar reconstructed images and MIPs were obtained and reviewed to evaluate the vascular anatomy. RADIATION DOSE REDUCTION: This exam was performed according to the departmental dose-optimization program which includes automated exposure control, adjustment of the mA and/or kV according to patient size and/or use of iterative reconstruction technique. CONTRAST:  146m OMNIPAQUE IOHEXOL 350 MG/ML SOLN COMPARISON:  CTA head and neck today reported separately. Chest CT 06/23/2017. CT Abdomen and Pelvis 04/16/2021. FINDINGS: CTA CHEST FINDINGS Cardiovascular: Negative for thoracic aortic dissection or aneurysm. Central pulmonary arteries appear patent. Minimal calcified atherosclerosis in the chest. Mild cardiomegaly which appears new since 2019. There is a small pericardial effusion with intermediate density fluid (series 4, image 107 and series 9, image 104) which is primarily along the right heart border. No periaortic hematoma identified. Mediastinum/Nodes: Negative. No mediastinal hematoma, mass or lymphadenopathy. Enteric tube courses through the esophagus. Lungs/Pleura: Intubated. Endotracheal tube tip in good position just below the clavicles. Small to moderate volume of retained secretions layering in the trachea at the tip of the tube on series 5,  image 42. But otherwise the major airways are patent. Lower lung volumes compared to 2019. Mild bilateral atelectasis. No pleural effusion or  consolidation. Musculoskeletal: No acute osseous abnormality identified. Review of the MIP images confirms the above findings. CTA ABDOMEN AND PELVIS FINDINGS VASCULAR Negative for abdominal aortic aneurysm or dissection. Relatively mild Aortoiliac calcified atherosclerosis. Major arterial structures in the abdomen and pelvis are patent. Review of the MIP images confirms the above findings. NON-VASCULAR Hepatobiliary: Possible gallstone in the gallbladder neck, but otherwise negative liver and gallbladder. No pericholecystic inflammation. Pancreas: Negative. Spleen: Negative. Adrenals/Urinary Tract: Chronic solitary right kidney is enhancing and appears nonobstructed. Left adrenal gland may be absent along with the left kidney. Right adrenal gland is normal. Bladder decompressed by Foley catheter. Incidental pelvic phleboliths. Stomach/Bowel: No dilated large or small bowel. Redundant colon. Normal appendix despite chronic appendicolith at the origin (series 9, image 220). Enteric tube terminates in the gastric body. Stomach and duodenum are decompressed. No free air, free fluid, or mesenteric inflammation identified. Lymphatic: No lymphadenopathy identified. Reproductive: Surgically absent uterus. Ovaries are within normal limits. Other: No pelvic free fluid today, free fluid seen in April has resolved. Musculoskeletal: No acute osseous abnormality identified. Review of the MIP images confirms the above findings. IMPRESSION: 1. Positive for a small pericardial effusion with mildly complex fluid density, but negative for aortic dissection, aortic aneurysm, or periaortic hematoma. Overall significance of this pericardial effusion is doubtful in the setting of severe intracranial hemorrhage. Consider Pericarditis. 2. Endotracheal tube tip in good position with layering retained secretions at the tip of the tube, but other major airways are patent. Mild atelectasis. 3. Enteric tube terminates in the gastric body. No  acute or inflammatory finding in the abdomen, or pelvis. Chronic left nephrectomy. Electronically Signed: By: Genevie Ann M.D. On: 08/21/2021 05:24   CT ANGIO HEAD NECK W WO CM  Result Date: 08/21/2021 CLINICAL DATA:  66 year old female with sudden severe headache. Hypertensive. EXAM: CT ANGIOGRAPHY HEAD AND NECK TECHNIQUE: Multidetector CT imaging of the head and neck was performed using the standard protocol during bolus administration of intravenous contrast. Multiplanar CT image reconstructions and MIPs were obtained to evaluate the vascular anatomy. Carotid stenosis measurements (when applicable) are obtained utilizing NASCET criteria, using the distal internal carotid diameter as the denominator. RADIATION DOSE REDUCTION: This exam was performed according to the departmental dose-optimization program which includes automated exposure control, adjustment of the mA and/or kV according to patient size and/or use of iterative reconstruction technique. CONTRAST:  174m OMNIPAQUE IOHEXOL 350 MG/ML SOLN COMPARISON:  Head CT from 0243 hours the same day. And brain MRI 03/02/2012. FINDINGS: CT HEAD Brain: New large intra-axial cerebral hemorrhage since 0243 hours today. Hemorrhage which might have started in the left lentiform nuclei and extends anterior into the anterior left frontal lobe and posterior to the posterior temporal lobe encompasses 101 x 44 by 68 mm (AP by transverse by CC) for an estimated intra-axial blood volume of 151 mL. Surrounding cerebral edema. Intracranial mass effect with new rightward midline shift of 15 mm. Early uncal herniation (series 6 image 37). Effaced left lateral ventricle and early trapped right lateral ventricle (note right atrium dilated series 4, image 17). However, no extra-axial or intraventricular extension of hemorrhage is identified. Suprasellar cistern is now partially effaced. Fourth ventricle is also partially effaced. Other basilar cisterns remain patent. Right hemisphere  and brainstem gray-white matter differentiation are preserved. Calvarium and skull base: No acute osseous abnormality identified. Paranasal sinuses: Visualized paranasal sinuses  and mastoids are stable and well aerated. Orbits: Visualized orbits and scalp soft tissues are within normal limits. CTA NECK Skeleton: No acute osseous abnormality identified. Upper chest: Chest CTA today is reported separately. Other neck: Endotracheal and enteric tubes are well placed in the neck. No acute neck soft tissue finding. Aortic arch: 3 vessel arch configuration. Mild arch atherosclerosis. Right carotid system: Negative. Left carotid system: Negative. Vertebral arteries: Proximal right subclavian artery and right vertebral artery origin are negative. Right vertebral is normal to the skull base. Proximal left vertebral artery mild soft plaque without stenosis. Normal left vertebral artery origin. Fairly codominant left vertebral artery is patent and normal to the skull base. CTA HEAD Posterior circulation: Codominant distal vertebral arteries are normal to the vertebrobasilar junction. Diminutive bilateral PICA. Some bilateral AICA enhancement also visible. Patent basilar artery to the tip. Patent SCA and PCA origins. Posterior communicating arteries are diminutive or absent. Mild mass effect on the left basilar tip and left PCA branches. But bilateral PCA branches remain within normal limits. Anterior circulation: Both ICA siphons are patent without significant plaque or stenosis. No ICA aneurysm. Carotid termini, MCA and ACA origins are patent. Mass effect on the left MCA and ACA origins but a remain normal (series 18, image 20). Dominant left A1. Anterior communicating artery and bilateral ACA branches remain within normal limits. Right MCA M1 segment and bifurcation are patent without stenosis. Right MCA branches are within normal limits. Mass effect on the left MCA M1 segment and MCA branches. But no MCA aneurysm or branch  stenosis. However, there is a relatively large positive CTA spot sign (up to 10 mm series 18, image 22) located along the medial margin of the intra-axial hematoma indicating active bleeding. Venous sinuses: Not evaluated. Anatomic variants: None significant. Review of the MIP images confirms the above findings IMPRESSION: 1. Large hyperacute intra-axial hemorrhage in the left hemisphere, new since 0243 hours today. EstimatedHematoma volume 151 mL, with pronounced CTA spot sign indicating ACTIVE HEMORRHAGE. Recommend emergent Neurosurgery consultation. 2. Intracranial edema and mass effect including 15 mL rightward midline shift. Early left uncal herniation. Early trapping of the right lateral ventricle. 3. No IVH or extra-axial blood at this time. And CTA Head and Neck is otherwise negative; no intracranial aneurysm. 4. CTA Chest today will be reported separately. Study discussed by telephone with Dr. Leonides Schanz in the ED on 08/21/2021 at 05:08 . Electronically Signed   By: Genevie Ann M.D.   On: 08/21/2021 05:13   DG Chest Portable 1 View  Result Date: 08/21/2021 CLINICAL DATA:  66 year old female with sudden severe headache. Acute intracranial hemorrhage. EXAM: PORTABLE CHEST 1 VIEW COMPARISON:  CT Chest, Abdomen, and Pelvis today reported separately. Earlier chest radiographs 08/21/2021 at 0243 hours. FINDINGS: Portable AP supine view at 0413 hours. Intubated. Endotracheal tube tip at the level the clavicles. Enteric tube placed and courses to the left upper quadrant. Side hole is just below the diaphragm. Pacer/resuscitation pads now project over the chest. Lower lung volumes. Stable cardiac size and mediastinal contours. Allowing for portable technique the lungs are clear. No acute osseous abnormality identified. IMPRESSION: 1. Satisfactory ET tube placement. Enteric tube side hole at or near the GEJ, recommend advancing 5 cm. 2. Lower lung volumes with no acute cardiopulmonary abnormality. Electronically Signed    By: Genevie Ann M.D.   On: 08/21/2021 04:56   DG Chest 2 View  Result Date: 08/21/2021 CLINICAL DATA:  Chest pain and hypertension. EXAM: CHEST - 2 VIEW COMPARISON:  May 07, 2021 FINDINGS: The heart size and mediastinal contours are within normal limits. Both lungs are clear. The visualized skeletal structures are unremarkable. IMPRESSION: No active cardiopulmonary disease. Electronically Signed   By: Virgina Norfolk M.D.   On: 08/21/2021 03:04   CT HEAD WO CONTRAST (5MM)  Result Date: 08/21/2021 CLINICAL DATA:  Headache and hypertension. EXAM: CT HEAD WITHOUT CONTRAST TECHNIQUE: Contiguous axial images were obtained from the base of the skull through the vertex without intravenous contrast. RADIATION DOSE REDUCTION: This exam was performed according to the departmental dose-optimization program which includes automated exposure control, adjustment of the mA and/or kV according to patient size and/or use of iterative reconstruction technique. COMPARISON:  October 14, 2016 FINDINGS: Brain: No evidence of acute infarction, hemorrhage, hydrocephalus, extra-axial collection or mass lesion/mass effect. Vascular: No hyperdense vessel or unexpected calcification. Skull: Normal. Negative for fracture or focal lesion. Sinuses/Orbits: No acute finding. Other: None. IMPRESSION: No acute intracranial abnormality. Electronically Signed   By: Virgina Norfolk M.D.   On: 08/21/2021 02:57     PROCEDURES:  NIH Stroke Scale  Interval: Baseline Time: 3:10AM Person Administering Scale: Janilah Hojnacki J  Administer stroke scale items in the order listed. Record performance in each category after each subscale exam. Do not go back and change scores. Follow directions provided for each exam technique. Scores should reflect what the patient does, not what the clinician thinks the patient can do. The clinician should record answers while administering the exam and work quickly. Except where indicated, the patient should not be  coached (i.e., repeated requests to patient to make a special effort).   1a  Level of consciousness: 0=alert; keenly responsive  1b. LOC questions:  0=Performs both tasks correctly  1c. LOC commands: 0=Performs both tasks correctly  2.  Best Gaze: 0=normal  3.  Visual: 0=No visual loss  4. Facial Palsy: 0=Normal symmetric movement  5a.  Motor left arm: 0=No drift, limb holds 90 (or 45) degrees for full 10 seconds  5b.  Motor right arm: 0=No drift, limb holds 90 (or 45) degrees for full 10 seconds  6a. motor left leg: 0=No drift, limb holds 90 (or 45) degrees for full 10 seconds  6b  Motor right leg:  0=No drift, limb holds 90 (or 45) degrees for full 10 seconds  7. Limb Ataxia: 0=Absent  8.  Sensory: 0=Normal; no sensory loss  9. Best Language:  0=No aphasia, normal  10. Dysarthria: 0=Normal  11. Extinction and Inattention: 0=No abnormality  12. Distal motor function: 0=Normal   Total:   0     NIH Stroke Scale  Interval: Developed symptoms during interview Time: 3:34AM Person Administering Scale: Fariha Goto J  Administer stroke scale items in the order listed. Record performance in each category after each subscale exam. Do not go back and change scores. Follow directions provided for each exam technique. Scores should reflect what the patient does, not what the clinician thinks the patient can do. The clinician should record answers while administering the exam and work quickly. Except where indicated, the patient should not be coached (i.e., repeated requests to patient to make a special effort).   1a  Level of consciousness: 1=not alert but arousable by minor stimulation to obey, answer or respond  1b. LOC questions:  2=Performs neither task correctly  1c. LOC commands: 2=Performs neither task correctly  2.  Best Gaze: 1=partial gaze palsy  3.  Visual: 0=No visual loss  4. Facial Palsy: 2=Partial paralysis (total or near total paralysis  of the lower face)  5a.  Motor left arm:  1=Drift, limb holds 90 (or 45) degrees but drifts down before full 10 seconds: does not hit bed  5b.  Motor right arm: 4=No movement  6a. motor left leg: 1=Drift, limb holds 90 (or 45) degrees but drifts down before full 10 seconds: does not hit bed  6b  Motor right leg:  4=No movement  7. Limb Ataxia: 0=Absent  8.  Sensory: 0=Normal; no sensory loss  9. Best Language:  3=Mute, global aphasia; no usable speech or auditory comprehension  10. Dysarthria: 2=Severe; patient speech is so slurred as to be unintelligible in the absence of or our of proportion to any dysphagia, or is mute/anarthric  11. Extinction and Inattention: 0=No abnormality  12. Distal motor function: 0=Normal   Total:   23     Critical Care performed: Yes, see critical care procedure note(s)  CRITICAL CARE Performed by: Paulette Blanch   Total critical care time: 90 minutes  Critical care time was exclusive of separately billable procedures and treating other patients.  Critical care was necessary to treat or prevent imminent or life-threatening deterioration.  Critical care was time spent personally by me on the following activities: development of treatment plan with patient and/or surrogate as well as nursing, discussions with consultants, evaluation of patient's response to treatment, examination of patient, obtaining history from patient or surrogate, ordering and performing treatments and interventions, ordering and review of laboratory studies, ordering and review of radiographic studies, pulse oximetry and re-evaluation of patient's condition.   Procedure Name: Intubation Date/Time: 08/21/2021 4:18 AM  Performed by: Paulette Blanch, MDPre-anesthesia Checklist: Patient identified, Patient being monitored, Emergency Drugs available, Timeout performed and Suction available Oxygen Delivery Method: Non-rebreather mask Preoxygenation: Pre-oxygenation with 100% oxygen Induction Type: Rapid sequence Ventilation: Mask  ventilation without difficulty Laryngoscope Size: Glidescope and 3 Tube size: 7.5 mm Number of attempts: 1 Airway Equipment and Method: Rigid stylet Placement Confirmation: ETT inserted through vocal cords under direct vision, CO2 detector and Breath sounds checked- equal and bilateral    .1-3 Lead EKG Interpretation  Performed by: Paulette Blanch, MD Authorized by: Paulette Blanch, MD     Interpretation: normal     ECG rate:  60   ECG rate assessment: normal     Rhythm: sinus rhythm     Ectopy: none     Conduction: normal   Comments:     Patient placed on cardiac monitor to evaluate for arrhythmias    MEDICATIONS ORDERED IN ED: Medications  ondansetron (ZOFRAN) 4 MG/2ML injection (has no administration in time range)  propofol (DIPRIVAN) 1000 MG/100ML infusion (50 mcg/kg/min  72.6 kg Intravenous Rate/Dose Change 08/21/21 0710)  fentaNYL 2577mg in NS 2542m(1094mml) infusion-PREMIX (25 mcg/hr Intravenous New Bag/Given 08/21/21 0419)  nicardipine (CARDENE) '20mg'$  in 0.86% saline 200m27m infusion (0.1 mg/ml) (0 mg/hr Intravenous Stopped 08/21/21 0551)  0.9 % irrigation (POUR BTL) (1,000 mLs Irrigation Given 08/21/21 0736)  lidocaine-EPINEPHrine (XYLOCAINE W/EPI) 1 %-1:100000 (with pres) injection (10 mLs  Given 08/21/21 0736)  thrombin recombinant (RECOTHROM) solution syringe (5,000 Units Topical Given 08/21/21 0744)  ondansetron (ZOFRAN) 4 MG/2ML injection (  Given 08/21/21 0331)  iohexol (OMNIPAQUE) 350 MG/ML injection 100 mL (100 mLs Intravenous Contrast Given 08/21/21 0443)  lidocaine (cardiac) 100 mg/5mL 79mLOCAINE) injection 2% 100 mg (100 mg Intravenous Given 08/21/21 0359)  propofol (DIPRIVAN) bolus via infusion 40 mg (40 mg Intravenous Bolus from Bag 08/21/21 0440)  succinylcholine (ANECTINE) syringe 200 mg (  200 mg Intravenous Given 08/21/21 0401)  etomidate (AMIDATE) injection 20 mg (20 mg Intravenous Given 08/21/21 0400)  coag fact Xa recombinant (ANDEXXA) high dose infusion 1800 mg  (1,800 mg Intravenous New Bag/Given 08/21/21 0543)  mannitol 20 % infusion 75 g (75 g Intravenous New Bag/Given 08/21/21 0518)  sodium chloride 3% (hypertonic) IV bolus 250 mL (0 mLs Intravenous Stopped 08/21/21 0551)  levETIRAcetam (KEPPRA) IVPB 1000 mg/100 mL premix (0 mg Intravenous Stopped 08/21/21 0536)     IMPRESSION / MDM / ASSESSMENT AND PLAN / ED COURSE  I reviewed the triage vital signs and the nursing notes.                             66 year old female presenting with elevated blood pressure, headache and chest pain. Differential diagnosis includes, but is not limited to, ACS, aortic dissection, pulmonary embolism, cardiac tamponade, pneumothorax, pneumonia, pericarditis, myocarditis, GI-related causes including esophagitis/gastritis, and musculoskeletal chest wall pain.   I have personally reviewed patient's records and noted telephone communication from patient's PCP dated 08/19/2021 that indicates she was started on Macrobid for UTI.  Patient's presentation is most consistent with acute presentation with potential threat to life or bodily function.  The patient is on the cardiac monitor to evaluate for evidence of arrhythmia and/or significant heart rate changes.  Patient seen after going to CT and x-ray from the lobby and subsequently roomed to treatment room 4.  Laboratory results demonstrate normal WBC 7.0, stable renal function creatinine 1.33, mild hypokalemia with potassium 3.3, initial troponin is unremarkable.  CT head negative for ICH.  Will administer morphine, avoid nitroglycerin as it will worsen patient's headache, add antihypertensive if morphine does not start lowering blood pressure.  Discussed with patient and recommend hospitalization for hypertensive urgency and chest pain work-up.  Clinical Course as of 08/21/21 0757  Sat Aug 21, 2021  0324 During our interview, patient began to slur her words with left facial droop noted.  Right leg weakness.  Code stroke  initiated. [JS]  [id number] Patient vomited; given IV Zofran.  Placed on nasal cannula oxygen per protocol.  Teleneurology online to evaluate patient. [JS]  [id number] Patient now aphasic with right-sided hemiplegia.  Diaphoretic. [JS]  1610 I was at the bedside present for patient's teleneurology interview and examination.  Neurologist recommends repeat head CT, CTA head and neck and CTA dissection protocol chest given patient's complaints of chest pain as well. [JS]  0406 Patient intubated for airway protection before going over for CT scans as she continues to vomit and is unable to follow commands. [JS]  9604 Patient showing signs of bradycardia, heart rate mid 40s.  I was present in the CT scanner with the patient.  Large left intraparenchymal hemorrhage noted.  Stat call back to teleneurology. [JS]  [phone] Spoke with Dr. Cathe Mons from teleneurology; will initiate Kcentra, blood pressure control.  Will discuss with neuro ICU at [facility] for transfer. [JS]  0600 Addendum: Appreciate assistance from my colleague Dr. Leonides Schanz facilitating neurosurgical intervention as I attended to another patient (CPR in progress). Patient's daughter now at bedside and updated. Husband has been at bedside and updated in real-time. [JS]    Clinical Course User Index [JS] Paulette Blanch, MD     FINAL CLINICAL IMPRESSION(S) / ED DIAGNOSES   Final diagnoses:  Chest pain, unspecified type  Hypertensive urgency  Nontraumatic subcortical hemorrhage of left cerebral hemisphere [facility])  Long term current use of anticoagulant  therapy     Rx / DC Orders   ED Discharge Orders     None        Note:  This document was prepared using Dragon voice recognition software and may include unintentional dictation errors.   Paulette Blanch, MD 08/21/21 [phone]

## 2021-08-21 NOTE — ED Notes (Signed)
Chaplain paged  

## 2021-08-21 NOTE — Progress Notes (Signed)
   08/21/21 0400  Clinical Encounter Type  Referral From Nurse  Consult/Referral To Chaplain   Chaplain responded to code stroke. Chaplain provided compassionate presence and reflective listening as husband spoke about wife's medical challenges. Husband has called their children to come to hospital. Chaplain will continue to provide support to family.

## 2021-08-21 NOTE — ED Notes (Signed)
Three rings removed from patient and given to husband.

## 2021-08-22 LAB — BPAM PLATELET PHERESIS
Blood Product Expiration Date: 202308222359
Blood Product Expiration Date: 202308212359
ISSUE DATE / TIME: 202308190658
ISSUE DATE / TIME: 202308190842
Unit Type and Rh: 6200
Unit Type and Rh: 5100

## 2021-08-22 LAB — PREPARE PLATELET PHERESIS
Unit division: 0
Unit division: 0

## 2021-08-23 ENCOUNTER — Encounter: Payer: Self-pay | Admitting: Neurological Surgery

## 2021-08-24 ENCOUNTER — Ambulatory Visit: Payer: Medicare Other | Admitting: Obstetrics & Gynecology

## 2021-08-24 LAB — TYPE AND SCREEN
ABO/RH(D): A POS
Antibody Screen: NEGATIVE
Unit division: 0
Unit division: 0

## 2021-08-24 LAB — BPAM RBC
Blood Product Expiration Date: 202309042359
Blood Product Expiration Date: 202309062359
ISSUE DATE / TIME: 202308190658
ISSUE DATE / TIME: 202308190658
Unit Type and Rh: 5100
Unit Type and Rh: 5100

## 2021-08-24 LAB — PREPARE RBC (CROSSMATCH)

## 2021-08-24 LAB — SURGICAL PATHOLOGY

## 2021-09-03 DEATH — deceased

## 2021-09-15 LAB — BLOOD GAS, ARTERIAL
Acid-base deficit: 4.4 mmol/L — ABNORMAL HIGH (ref 0.0–2.0)
Bicarbonate: 22.1 mmol/L (ref 20.0–28.0)
O2 Saturation: 100 %
Patient temperature: 37
pCO2 arterial: 46 mmHg (ref 32–48)
pH, Arterial: 7.29 — ABNORMAL LOW (ref 7.35–7.45)
pO2, Arterial: 251 mmHg — ABNORMAL HIGH (ref 83–108)

## 2021-09-23 ENCOUNTER — Encounter: Payer: Self-pay | Admitting: Nurse Practitioner

## 2021-09-30 ENCOUNTER — Ambulatory Visit: Payer: Medicare Other | Admitting: Pulmonary Disease

## 2021-09-30 ENCOUNTER — Encounter: Payer: Self-pay | Admitting: Pulmonary Disease

## 2021-11-18 ENCOUNTER — Ambulatory Visit: Payer: Medicare Other | Admitting: Dermatology

## 2022-05-22 IMAGING — CT CT ABD-PELV W/O CM
2 of 4 series · 15 of 46 positions shown, 17 images · non-contrast
Comparison: 04/21/2020

CLINICAL DATA: Mid abdominal bulging.



[Series 2: routine abd/pel wo · axial · 0.84mm/px · z∈[-569,-149]mm · 12 of 93 slices shown, 14 images]
[im 5/93  soft-tissue]
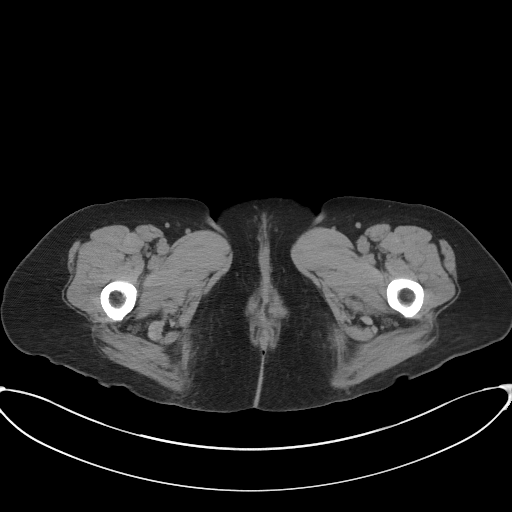
[im 5/93  bone]
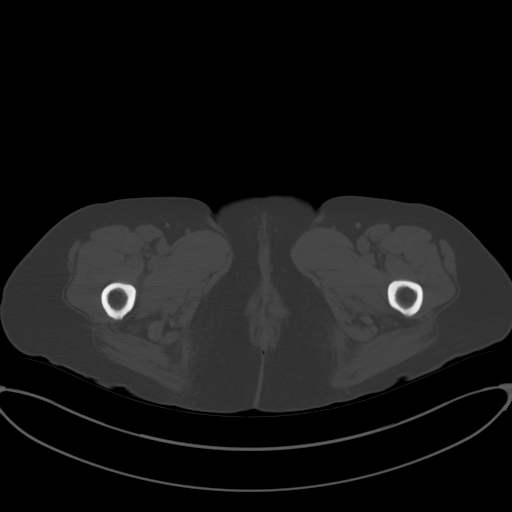
[im 13/93  soft-tissue]
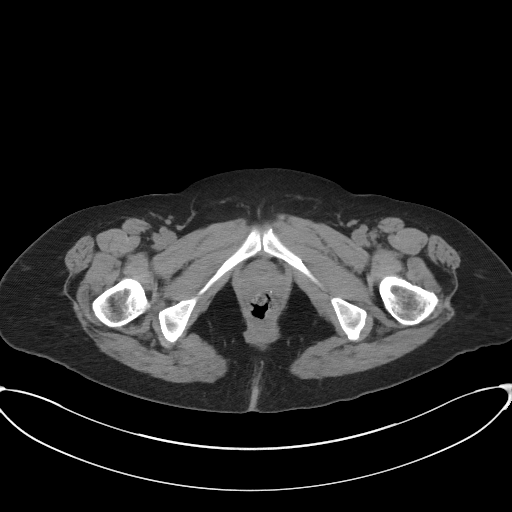
[im 21/93  soft-tissue]
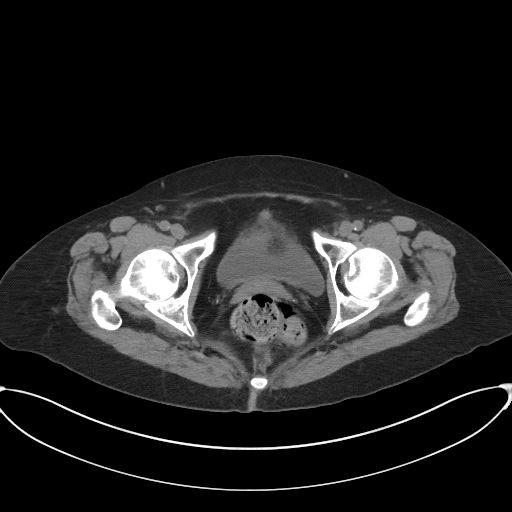
[im 29/93  soft-tissue]
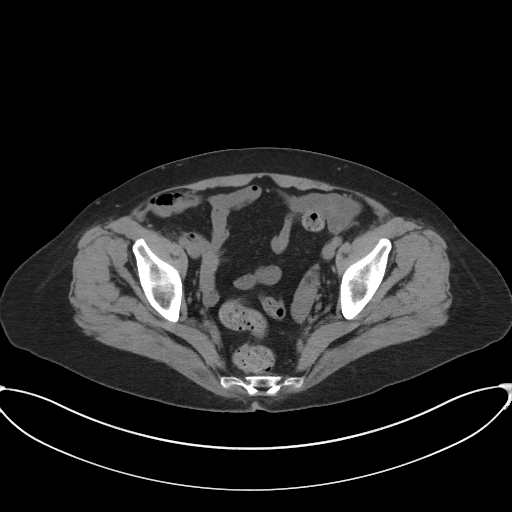
[im 37/93  soft-tissue]
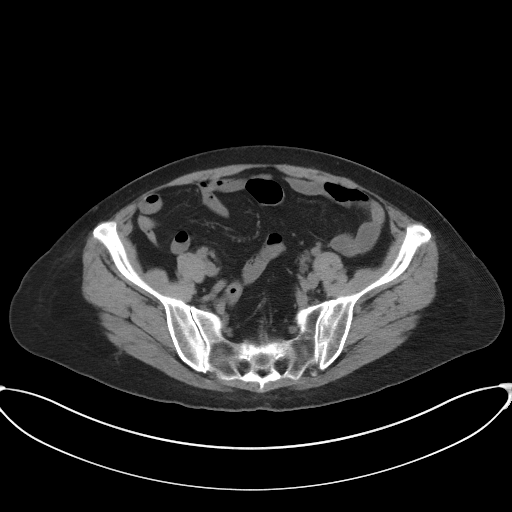
[im 45/93  soft-tissue]
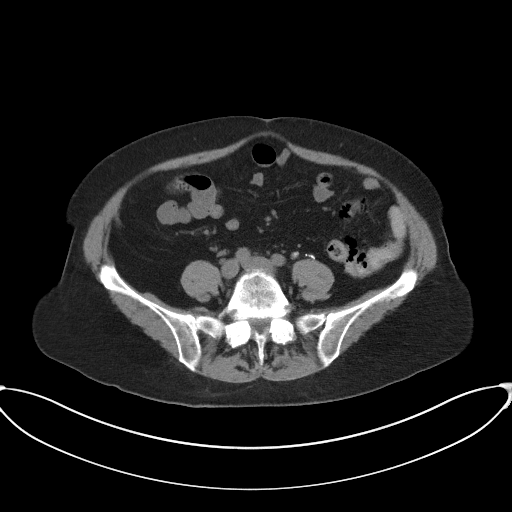
[im 49/93  soft-tissue]
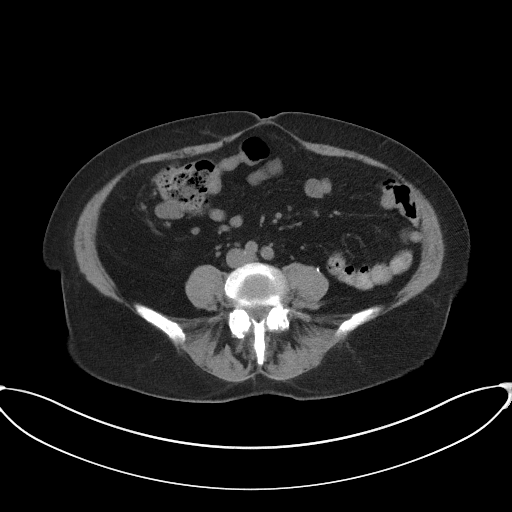
[im 57/93  soft-tissue]
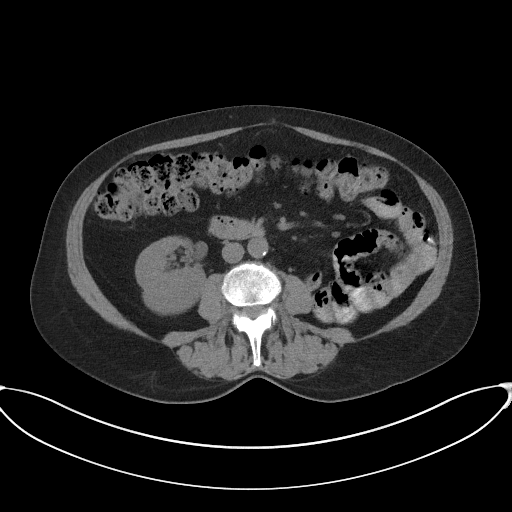
[im 65/93  soft-tissue]
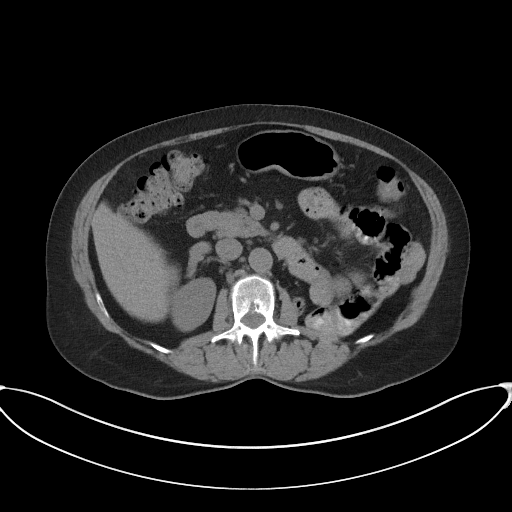
[im 65/93  bone]
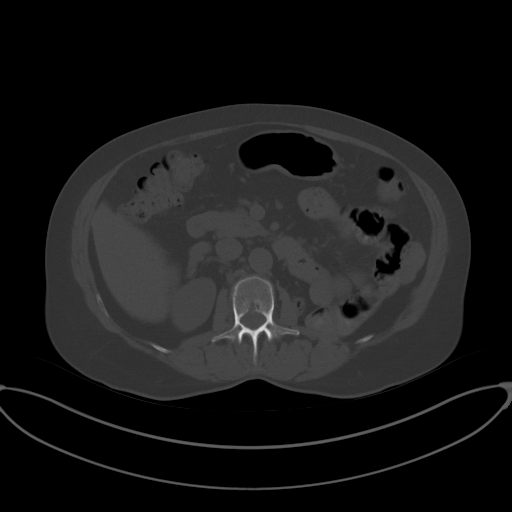
[im 73/93  soft-tissue]
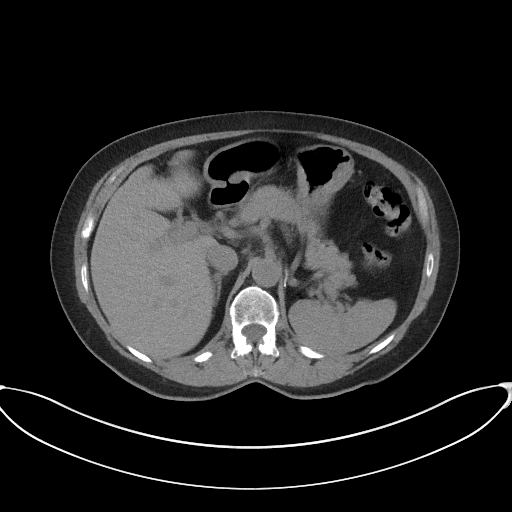
[im 81/93  soft-tissue]
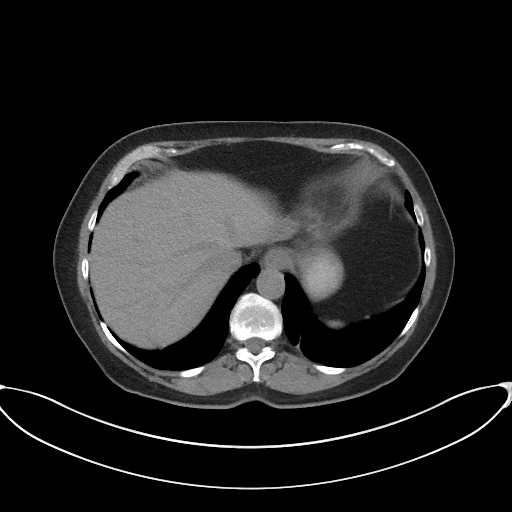
[im 89/93  soft-tissue]
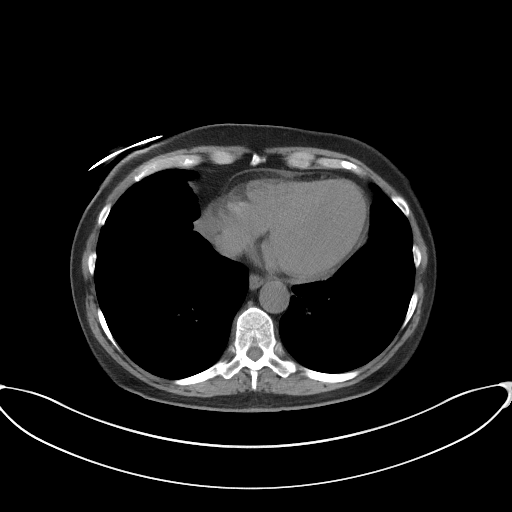

[Series 5: coronal st · coronal · 0.87mm/px · 3 of 88 slices shown]
[im 30/88  soft-tissue]
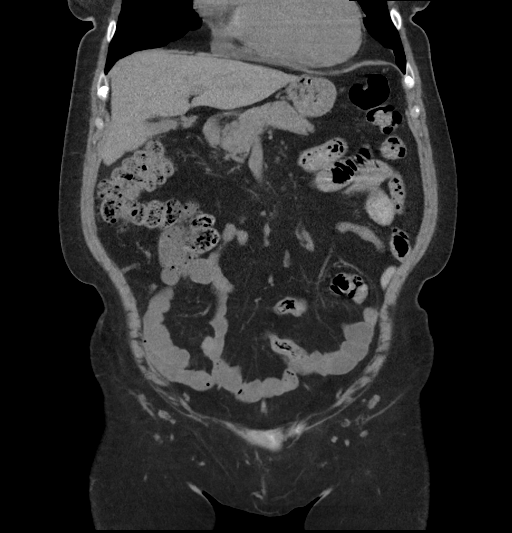
[im 39/88  soft-tissue]
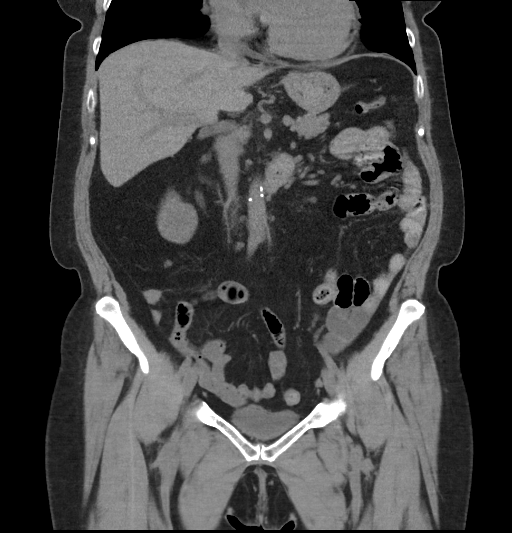
[im 49/88  soft-tissue]
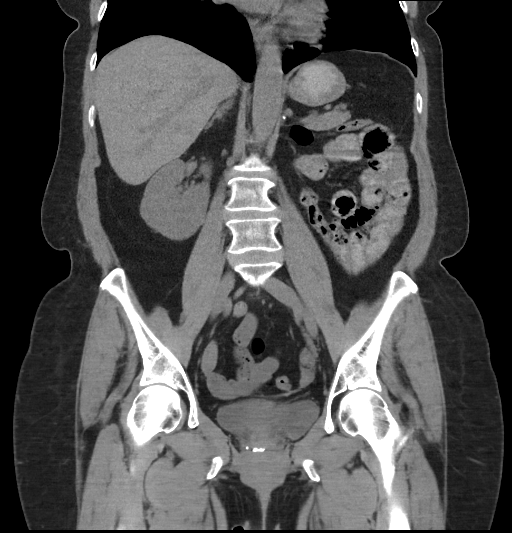

[15 of 46 positions shown; findings below may reference images not displayed]

FINDINGS: Lower chest: Unremarkable.

Hepatobiliary: No suspicious focal abnormality in the liver on this
study without intravenous contrast. There is no evidence for
gallstones, gallbladder wall thickening, or pericholecystic fluid.
No intrahepatic or extrahepatic biliary dilation.

Pancreas: No focal mass lesion. No dilatation of the main duct. No
intraparenchymal cyst. No peripancreatic edema.

Spleen: No splenomegaly. No focal mass lesion.

Adrenals/Urinary Tract: Right adrenal gland unremarkable. Left
adrenal gland not well seen. Left kidney surgically absent. Right
kidney unremarkable on noncontrast imaging. Right ureter
unremarkable. The urinary bladder appears normal for the degree of
distention.

Stomach/Bowel: Stomach is unremarkable. No gastric wall thickening.
No evidence of outlet obstruction. Duodenum is normally positioned
as is the ligament of Treitz. No small bowel wall thickening. No
small bowel dilatation. The terminal ileum is normal. 7 x 3 mm
appendicoliths noted at the base of the appendix. Appendix is
otherwise normal in appearance. No gross colonic mass. No colonic
wall thickening.

Vascular/Lymphatic: There is mild atherosclerotic calcification of
the abdominal aorta without aneurysm. There is no gastrohepatic or
hepatoduodenal ligament lymphadenopathy. No retroperitoneal or
mesenteric lymphadenopathy. No pelvic sidewall lymphadenopathy.

Reproductive: Uterus surgically absent.  There is no adnexal mass.

Other: No intraperitoneal free fluid.

Musculoskeletal: No worrisome lytic or sclerotic osseous
abnormality. There is a midline epigastric supraumbilical ventral
hernia approximately 10-11 cm cranial to the umbilicus. Fascial
defect is visible on axial [DATE] and sagittal 83/6 measuring 1.7 x
1.2 cm. Preperitoneal and possibly fat from the gastrocolic ligament
protrude through the defect with hernia sac measuring on the order
of 2.5 x 1.0 x 2.3 cm. There is mild diastasis of the rectus
musculature with midline fascial laxity at the level of the
umbilicus.
IMPRESSION: 1. No acute findings in the abdomen or pelvis.
2. Supraumbilical midline ventral hernia contains only fat, as
above. Rectus diastasis evident with fascial laxity at the midline
in the region of the umbilicus.
3. 7 x 3 mm appendicolith noted at the base of the appendix.
Appendix is otherwise normal in appearance.
4. Status post left nephrectomy.
5. Aortic Atherosclerosis ([id number]).

## 2023-10-10 NOTE — Progress Notes (Signed)
 Assessment & Plan:  1. Severe persistent asthma (Primary)  2. Idiopathic hypereosinophilic syndrome  3. Stage 3a chronic kidney disease (HCC)  4. Eosinophilic esophagitis   Patient Instructions  We will start your Nucala  back.  We will see him in follow-up in 2 months time call sooner should any new difficulties arise  Please note: late entry documentation due to logistical difficulties during COVID-19 pandemic. This note is filed for information purposes only, and is not intended to be used for billing, nor does it represent the full scope/nature of the visit in question. Please see any associated scanned media linked to date of encounter for additional pertinent information.  Subjective:    HPI: Heidi Torres is a 68 y.o. female presenting to the pulmonology clinic on 05/16/2019 with report of: Follow-up (Asthms controlled )     Outpatient Encounter Medications as of 05/16/2019  Medication Sig   [DISCONTINUED] ADVAIR  DISKUS 500-50 MCG/DOSE AEPB TAKE 1 PUFF BY MOUTH TWICE A DAY (Patient taking differently: Inhale 1 puff into the lungs 2 (two) times daily.)   [DISCONTINUED] albuterol  (VENTOLIN  HFA) 108 (90 Base) MCG/ACT inhaler Inhale 2 puffs into the lungs every 4 (four) hours as needed for wheezing or shortness of breath.   [DISCONTINUED] ALPRAZolam  (XANAX ) 0.25 MG tablet Take 0.25 mg by mouth at bedtime as needed for anxiety.   [DISCONTINUED] aspirin  EC 81 MG tablet Take 81 mg by mouth daily.   [DISCONTINUED] atorvastatin  (LIPITOR) 40 MG tablet Take 40 mg by mouth every morning.   [DISCONTINUED] Cholecalciferol  (VITAMIN D3) 1000 units CAPS Take 1,000 Units by mouth daily.   [DISCONTINUED] fluticasone  (FLONASE ) 50 MCG/ACT nasal spray Place 2 sprays into both nostrils daily.   [DISCONTINUED] ipratropium-albuterol  (DUONEB) 0.5-2.5 (3) MG/3ML SOLN Take 3 mLs by nebulization.   [DISCONTINUED] levalbuterol  (XOPENEX  HFA) 45 MCG/ACT inhaler    [DISCONTINUED] metoprolol   succinate (TOPROL -XL) 50 MG 24 hr tablet as needed   Comments:   Filled Date: Feb 20 2017 12:00AM  Duration: 90   [DISCONTINUED] montelukast  (SINGULAIR ) 10 MG tablet Take 10 mg by mouth daily.   [DISCONTINUED] NUCALA  100 MG/ML SOAJ INJECT 1 PEN UNDER THE SKIN EVERY 28 DAYS   [DISCONTINUED] olmesartan  (BENICAR ) 20 MG tablet Take 20 mg by mouth every morning.   [DISCONTINUED] pantoprazole  (PROTONIX ) 40 MG tablet Take 40 mg by mouth 2 (two) times daily.   [DISCONTINUED] potassium chloride  (MICRO-K ) 10 MEQ CR capsule Take 20 mEq by mouth daily.   [DISCONTINUED] PREMARIN  vaginal cream PLACE 1 APPLICATORFUL VAGINALLY DAILY. USE PEA SIZED AMOUNT M-W-FR BEFORE BEDTIME   [DISCONTINUED] triamterene -hydrochlorothiazide  (DYAZIDE ) 37.5-25 MG per capsule Take 1 capsule by mouth every morning.   [DISCONTINUED] trimethoprim  (TRIMPEX ) 100 MG tablet Take 1 tablet (100 mg total) by mouth daily.   [DISCONTINUED] calcitRIOL  (ROCALTROL ) 0.25 MCG capsule    [DISCONTINUED] estradiol  (ESTRACE ) 2 MG tablet Comments:   Filled Date: Jan 19 2017 12:00AM  Patient Notes: TAKE 1 TABLET BY MOUTH EVERY DAY FOR HORMONE  Duration: 90   [DISCONTINUED] levalbuterol  (XOPENEX ) 0.63 MG/3ML nebulizer solution Take 3 mLs (0.63 mg total) by nebulization every 4 (four) hours as needed for wheezing or shortness of breath. Use as needed if Xopenex  inhaler provides insufficient relief of shortness of breath, wheezing, chest tightness   [DISCONTINUED] ranitidine  (ZANTAC ) 75 MG tablet Take 75 mg by mouth 2 (two) times daily.   [DISCONTINUED] sucralfate  (CARAFATE ) 1 G tablet Take 1 g by mouth 2 (two) times daily.    No facility-administered encounter medications  on file as of 05/16/2019.      Objective:   Vitals:   05/16/19 1341  BP: 134/64  Pulse: 87  Temp: 98 F (36.7 C)  Height: 5' 6 (1.676 m)  Weight: 162 lb 6.4 oz (73.7 kg)  SpO2: 97%  TempSrc: Temporal  BMI (Calculated): 26.22     Physical exam documentation is limited  by delayed entry of information.
